# Patient Record
Sex: Male | Born: 1970
Health system: Southern US, Community
[De-identification: ages and names within clinical notes are randomized; demographics above are authoritative.]

## PROBLEM LIST (undated history)

## (undated) DIAGNOSIS — N289 Disorder of kidney and ureter, unspecified: Secondary | ICD-10-CM

## (undated) DIAGNOSIS — I00 Rheumatic fever without heart involvement: Secondary | ICD-10-CM

## (undated) DIAGNOSIS — S37011A Minor contusion of right kidney, initial encounter: Secondary | ICD-10-CM

## (undated) HISTORY — PX: OTHER SURGICAL HISTORY: SHX169

---
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
## 1991-10-14 DIAGNOSIS — M459 Ankylosing spondylitis of unspecified sites in spine: Secondary | ICD-10-CM

## 1991-10-14 HISTORY — DX: Ankylosing spondylitis of unspecified sites in spine: M45.9

## 1995-10-14 DIAGNOSIS — F411 Generalized anxiety disorder: Secondary | ICD-10-CM

## 1995-10-14 HISTORY — DX: Generalized anxiety disorder: F41.1

## 2003-10-14 DIAGNOSIS — F319 Bipolar disorder, unspecified: Secondary | ICD-10-CM

## 2003-10-14 DIAGNOSIS — F840 Autistic disorder: Secondary | ICD-10-CM

## 2003-10-14 HISTORY — DX: Bipolar disorder, unspecified: F31.9

## 2003-10-14 HISTORY — DX: Autistic disorder: F84.0

## 2005-10-13 DIAGNOSIS — E119 Type 2 diabetes mellitus without complications: Secondary | ICD-10-CM

## 2005-10-13 HISTORY — DX: Type 2 diabetes mellitus without complications: E11.9

## 2014-07-08 DIAGNOSIS — M25561 Pain in right knee: Secondary | ICD-10-CM | POA: Insufficient documentation

## 2015-05-31 ENCOUNTER — Other Ambulatory Visit: Payer: Self-pay | Admitting: General Surgery

## 2015-06-22 ENCOUNTER — Ambulatory Visit (HOSPITAL_COMMUNITY)
Admission: RE | Admit: 2015-06-22 | Discharge: 2015-06-22 | Disposition: A | Discharge status: Home / Self Care | Payer: BC Managed Care – PPO | Source: Ambulatory Visit | Attending: General Surgery | Admitting: General Surgery

## 2015-06-22 ENCOUNTER — Inpatient Hospital Stay (HOSPITAL_COMMUNITY)
Admission: RE | Admit: 2015-06-22 | Discharge: 2015-06-22 | Disposition: A | Discharge status: Home / Self Care | Payer: BC Managed Care – PPO | Source: Ambulatory Visit

## 2015-06-22 ENCOUNTER — Other Ambulatory Visit: Payer: Self-pay

## 2015-06-22 ENCOUNTER — Other Ambulatory Visit (HOSPITAL_COMMUNITY): Payer: Self-pay | Admitting: *Deleted

## 2015-06-22 DIAGNOSIS — Z01818 Encounter for other preprocedural examination: Secondary | ICD-10-CM | POA: Insufficient documentation

## 2015-07-05 ENCOUNTER — Encounter: Payer: BC Managed Care – PPO | Attending: General Surgery | Admitting: Dietician

## 2015-07-05 ENCOUNTER — Encounter: Payer: Self-pay | Admitting: Dietician

## 2015-07-05 DIAGNOSIS — Z713 Dietary counseling and surveillance: Secondary | ICD-10-CM | POA: Insufficient documentation

## 2015-07-05 DIAGNOSIS — Z6841 Body Mass Index (BMI) 40.0 and over, adult: Secondary | ICD-10-CM | POA: Insufficient documentation

## 2015-07-05 NOTE — Progress Notes (Signed)
  Pre-Op Assessment Visit:  Pre-Operative RYGB Surgery  Medical Nutrition Therapy:  Appt start time: 1535   End time:  1620.  Patient was seen on 07/05/2015 for Pre-Operative Nutrition Assessment. Assessment and letter of approval faxed to [facility] Bariatric Surgery Program coordinator on 07/05/2015.   Preferred Learning Style:   No preference indicated   Learning Readiness:   Ready  Handouts given during visit include:  Pre-Op Goals Bariatric Surgery Protein Shakes   During the appointment today the following Pre-Op Goals were reviewed with the patient: Maintain or lose weight as instructed by your surgeon Make healthy food choices Begin to limit portion sizes Limited concentrated sugars and fried foods Keep fat/sugar in the single digits per serving on   food labels Practice CHEWING your food  (aim for 30 chews per bite or until applesauce consistency) Practice not drinking 15 minutes before, during, and 30 minutes after each meal/snack Avoid all carbonated beverages  Avoid/limit caffeinated beverages  Avoid all sugar-sweetened beverages Consume 3 meals per day; eat every 3-5 hours Make a list of non-food related activities Aim for 64-100 ounces of FLUID daily  Aim for at least 60-80 grams of PROTEIN daily Look for a liquid protein source that contain ?15 g protein and ?5 g carbohydrate  (ex: shakes, drinks, shots)  Patient-Centered Goals: Goals: be more active with kids, have body feel better, be able to do anything physically that he wants to do, run a 5K   10 level of confidence/10 level of importance   Demonstrated degree of understanding via:  Teach Back  Teaching Method Utilized:  Visual Auditory Hands on  Barriers to learning/adherence to lifestyle change: none  Patient to call the [facility] to enroll in Pre-Op and Post-Op Nutrition Education when surgery date is scheduled.

## 2015-07-05 NOTE — Patient Instructions (Signed)
Follow Pre-Op Goals Try Protein Shakes  Things to remember:  Please always be honest with us. We want to support you!  If you have any questions or concerns in between appointments, please call or email Liz, Leslie, or Laurie.  The diet after surgery will be high protein and low in carbohydrate.  Vitamins and calcium need to be taken for the rest of your life.  Feel free to include support people in any classes or appointments. 

## 2015-07-11 NOTE — Progress Notes (Signed)
  Pre-Operative Nutrition Class:  Appt start time: 8127   End time:  1830.  Patient was seen on 07/09/15 for Pre-Operative Bariatric Surgery Education at the [facility].   Surgery date:  Surgery type: RYGB Start weight at [facility]: 371 lbs on 07/05/15 Weight today: 374.6 lbs  TANITA  BODY COMP RESULTS  07/09/15   BMI (kg/m^2) N/A   Fat Mass (lbs)    Fat Free Mass (lbs)    Total Body Water (lbs)      The following the learning objectives were met by the patient during this course:  Identify Pre-Op Dietary Goals and will begin 2 weeks pre-operatively  Identify appropriate sources of fluids and proteins   State protein recommendations and appropriate sources pre and post-operatively  Identify Post-Operative Dietary Goals and will follow for 2 weeks post-operatively  Identify appropriate multivitamin and calcium sources  Describe the need for physical activity post-operatively and will follow MD recommendations  State when to call healthcare provider regarding medication questions or post-operative complications  Handouts given during class include:  Pre-Op Bariatric Surgery Diet Handout  Protein Shake Handout  Post-Op Bariatric Surgery Nutrition Handout  BELT Program Information Flyer  Support Group Information Flyer  [facility] Bariatric Supplements Price List  Follow-Up Plan: Patient will follow-up at [facility] 2 weeks post operatively for diet advancement per MD.

## 2015-07-20 NOTE — Progress Notes (Signed)
Please put orders in Epic surgery 08-06-15 pre op 08-02-15 Thanks

## 2015-07-27 ENCOUNTER — Ambulatory Visit: Payer: Self-pay | Admitting: General Surgery

## 2015-07-27 NOTE — H&P (Signed)
Edward [name] 07/27/2015 1:36 PM Location: [facility] Patient #: [id number] DOB: 12/25/1970 Married / Language: English / Race: White Male History of Present Illness (Kylynn Street M. Oneda Duffett MD; 07/27/2015 2:06 PM) Patient words: preop.  The patient is a 44 year old male who presents for a pre-op visit. He comes in today for his preoperative visit. He was initially seen on August 18. His weight at that time was 381 pounds. He has been approved for laparoscopic Roux-en-Y gastric bypass. He denies any medical changes since he was initially seen. He did follow up with his endocrinologist after his evaluation labs revealed a hemoglobin A1c of 14.8. Due to some financial issues he had not been taking his insulin regularly. They have been monitoring him for the past couple weeks and have reported better blood glucose control. He denies any fever, chills, chest pain, shortness of breath, dyspnea on exertion, abdominal pain, nausea, vomiting, dysphagia. He denies any diarrhea or constipation. He reports feeling better and having more energy since his blood sugars are better controlled now. He denies any issues of sensations of the getting stuck in his upper neck or upper chest.  His upper GI showed no evidence of hiatal hernia. It did show some esophageal dysmotility. His abdominal ultrasound showed a single gallstone without any evidence of fatty liver. His lipid panel was abnormal. His total cholesterol level was 355, triglyceride level 300, HDL cholesterol 37, LDL cholesterol 258. This is actually improved for him. In comparing prior lipid panels his cholesterol level and triglyceride levels have been as high as 800. He reports a significant family history of hypercholesterolemia. Problem List/Past Medical (Errik Mitchelle M Shandrell Boda, MD; 07/27/2015 2:05 PM) MORBID OBESITY WITH BMI OF 50.0-59.9, ADULT (E66.01)  Other Problems (Njeri Vicente M Daneil Beem, MD; 07/27/2015 2:05 PM) Other disease, cancer,  significant illness Sleep Apnea HYPERCHOLESTEROLEMIA (E78.00) RHEUMATOID ARTHRITIS INVOLVING MULTIPLE SITES, UNSPECIFIED RHEUMATOID FACTOR PRESENCE (M06.9) DIABETES MELLITUS TYPE 2 IN OBESE (E11.9) ESSENTIAL HYPERTENSION (I10) ANXIETY AND DEPRESSION (F41.9, F32.9) Anxiety Disorder Back Pain  Past Surgical History (Fiore Detjen M Marian Meneely, MD; 07/27/2015 2:05 PM) Oral Surgery Hip Surgery Left. Knee Surgery Bilateral.  Diagnostic Studies History (Jisele Price M Cay Kath, MD; 07/27/2015 2:05 PM) Colonoscopy never  Allergies (Ammie Eversole, LPN; 07/27/2015 1:37 PM) Demerol *ANALGESICS - OPIOID*  Medication History (Jamieon Lannen M Rubena Roseman, MD; 07/27/2015 2:05 PM) AmLODIPine Besylate (5MG Tablet, Oral) Active. Invokana (300MG Tablet, Oral) Active. Carvedilol (12.5MG Tablet, Oral) Active. ClonazePAM (1MG Tablet, Oral) Active. Zetia (10MG Tablet, Oral) Active. NovoLOG FlexPen (100UNIT/ML Soln Pen-inj, Subcutaneous) Active. Lisinopril-Hydrochlorothiazide (20-12.5MG Tablet, Oral) Active. MetFORMIN HCl ER (500MG Tablet ER 24HR, Oral) Active. Livalo (2MG Tablet, Oral) Active. TiZANidine HCl (4MG Tablet, Oral) Active. TraMADol HCl (50MG Tablet, Oral) Active. Venlafaxine HCl ER (75MG Capsule ER 24HR, Oral) Active. Lantus (100UNIT/ML Solution, Subcutaneous) Active. Medications Reconciled OxyCODONE HCl (5MG/5ML Solution, 5-10 Milliliter Oral every four hours, as needed, Taken starting 07/27/2015) Active. Protonix (40MG Tablet DR, 1 (one) Tablet DR Oral daily, Taken starting 07/27/2015) Active. Ondansetron (4MG Tablet Disperse, 1 (one) Tablet Disperse Oral every six hours, as needed, Taken starting 07/27/2015) Active.  Social History (Alainah Phang M Damyah Gugel, MD; 07/27/2015 2:05 PM) Caffeine use Carbonated beverages, Tea. No drug use Tobacco use Former smoker. Alcohol use Occasional alcohol use.  Family History (Dustyn Dansereau M Shaketa Serafin, MD; 07/27/2015 2:05 PM) Arthritis Father, Mother. Cancer  Family Members In General, Father. Depression Brother. Thyroid problems Father. Diabetes Mellitus Brother, Father. Heart Disease Family Members In General. Hypertension Brother, Father, Mother.     Review of Systems (  Atilano Ina, MD; 07/27/2015 2:01 00) General Present- Fatigue and Night Sweats. Not Present- Appetite Loss, Chills, Fever, Weight Gain and Weight Loss. Skin Present- Dryness, Non-Healing Wounds and Rash. Not Present- Change in Wart/Mole, Hives, Jaundice, New Lesions and Ulcer. HEENT Present- Visual Disturbances and Wears glasses/contact lenses. Not Present- Earache, Hearing Loss, Hoarseness, Nose Bleed, Oral Ulcers, Ringing in the Ears, Seasonal Allergies, Sinus Pain, Sore Throat and Yellow Eyes. Respiratory Present- Snoring. Not Present- Bloody sputum, Chronic Cough, Difficulty Breathing and Wheezing. Breast Not Present- Breast Mass, Breast Pain, Nipple Discharge and Skin Changes. Cardiovascular Present- Swelling of Extremities. Not Present- Chest Pain, Difficulty Breathing Lying Down, Leg Cramps, Palpitations, Rapid Heart Rate and Shortness of Breath. Gastrointestinal Present- Excessive gas. Not Present- Abdominal Pain, Bloating, Bloody Stool, Change in Bowel Habits, Chronic diarrhea, Constipation, Difficulty Swallowing, Gets full quickly at meals, Hemorrhoids, Indigestion, Nausea, Rectal Pain and Vomiting. Male Genitourinary Present- Frequency, Nocturia, Urgency and Urine Leakage. Not Present- Blood in Urine, Change in Urinary Stream, Impotence and Painful Urination. Musculoskeletal Present- Back Pain, Joint Pain, Joint Stiffness, Muscle Pain, Muscle Weakness and Swelling of Extremities. Psychiatric Present- Anxiety and Depression. Not Present- Bipolar, Change in Sleep Pattern, Fearful and Frequent crying. Endocrine Present- Excessive Hunger and Heat Intolerance. Not Present- Cold Intolerance, Hair Changes and New Diabetes. Hematology Not Present- Easy Bruising, Excessive  bleeding, Gland problems, HIV and Persistent Infections.  Vitals (Ammie Eversole LPN; 61/60/7371 1:37 PM) 07/27/2015 1:36 PM Weight: 372.4 lb Height: 71.5in Body Surface Area: 2.92 m Body Mass Index: 51.21 kg/m Temp.: 98.71F(Oral)  Pulse: 72 (Regular)  BP: 132/80 (Sitting, Left Arm, Standard)     Physical Exam Minerva Areola M. Lorali Khamis MD; 07/27/2015 2:01 PM)  General Mental Status-Alert. General Appearance-Consistent with stated age. Hydration-Well hydrated. Voice-Normal. Note: morbidly obese; central truncal   Integumentary Note: knee scars   Head and Neck Head-normocephalic, atraumatic with no lesions or palpable masses. Trachea-midline. Thyroid Gland Characteristics - normal size and consistency.  Eye Eyeball - Bilateral-Extraocular movements intact. Sclera/Conjunctiva - Bilateral-No scleral icterus.  Chest and Lung Exam Chest and lung exam reveals -quiet, even and easy respiratory effort with no use of accessory muscles and on auscultation, normal breath sounds, no adventitious sounds and normal vocal resonance. Inspection Chest Wall - Normal. Back - normal.  Breast - Did not examine.  Cardiovascular Cardiovascular examination reveals -normal heart sounds, regular rate and rhythm with no murmurs and normal pedal pulses bilaterally.  Abdomen Inspection Inspection of the abdomen reveals - No Hernias. Skin - Scar - no surgical scars. Palpation/Percussion Palpation and Percussion of the abdomen reveal - Soft, Non Tender, No Rebound tenderness, No Rigidity (guarding) and No hepatosplenomegaly. Auscultation Auscultation of the abdomen reveals - Bowel sounds normal.  Peripheral Vascular Upper Extremity Palpation - Pulses bilaterally normal.  Neurologic Neurologic evaluation reveals -alert and oriented x 3 with no impairment of recent or remote memory. Mental Status-Normal.  Neuropsychiatric The patient's mood and affect are  described as -normal. Judgment and Insight-insight is appropriate concerning matters relevant to self.  Musculoskeletal Normal Exam - Left-Upper Extremity Strength Normal and Lower Extremity Strength Normal. Normal Exam - Right-Upper Extremity Strength Normal and Lower Extremity Strength Normal.  Lymphatic Head & Neck  General Head & Neck Lymphatics: Bilateral - Description - Normal. Axillary - Did not examine. Femoral & Inguinal - Did not examine.    Assessment & Plan Minerva Areola M. Peaches Vanoverbeke MD; 07/27/2015 2:05 PM)  MORBID OBESITY WITH BMI OF 50.0-59.9, ADULT (E66.01) Impression: We reviewed his preoperative workup. We discussed  results of his upper GI and abdominal ultrasound. He also reviewed his labs. I congratulated him on improvement of his diabetes management. We discussed the importance of monitoring his blood sugars regularly. We also discussed the importance of the preoperative diet. He has lost about 10 pounds since his initial visit. We discussed the typical postoperative course. He was given prescriptions today for postoperative pain, nausea, and reflux medications. He was also given instructions for his preoperative bowel prep.  Current Plans Pt Education - EMW_preopbariatric Started OxyCODONE HCl 5MG /5ML, 5-10 Milliliter every four hours, as needed, 200 Milliliter, 07/27/2015, No Refill. Started Protonix 40MG , 1 (one) Tablet DR daily, #30, 07/27/2015, No Refill. Started Ondansetron 4MG , 1 (one) Tablet Disperse every six hours, as needed, #25, 07/27/2015, No Refill. DIABETES MELLITUS TYPE 2 IN OBESE (E11.9)  RHEUMATOID ARTHRITIS INVOLVING MULTIPLE SITES, UNSPECIFIED RHEUMATOID FACTOR PRESENCE (M06.9) Impression: We discussed the need for him to avoid NSAIDs as much as possible after gastric bypass because of the risk of ulcer formation. We discussed that his regimen may have to be altered.  HYPERCHOLESTEROLEMIA (E78.00)  ANXIETY AND DEPRESSION (F41.9)  ESSENTIAL  HYPERTENSION (I10)  07/29/2015. , MD, FACS General, Bariatric, & Minimally Invasive Surgery [facility], [name]

## 2015-07-30 NOTE — Patient Instructions (Addendum)
Lycan Davee  07/30/2015   Your procedure is scheduled on: 08-06-15  Report to [facility] Main  Entrance take [facility]  elevators to 3rd floor to  Short Stay Center at 1140  AM.  Call this number if you have problems the morning of surgery [phone]   Remember: ONLY 1 PERSON MAY GO WITH YOU TO SHORT STAY TO GET  READY MORNING OF YOUR SURGERY.  Do not eat food  :After Midnight Saturday night clear liquids all day Sunday , clear liquids from midnight until 740 am day of surgery, nothing by mouth after 740 am day of your surgery.   follow all bowel prep instructions from dr wilson's office.    Take these medicines the morning of surgery with A SIP OF WATER: amlodipine (norvasc), venlafaxine (effexor) carvedilol. Clonazepam, zanaflez, , take 1/2 dose bedtime lantus insulin on 10/23-16,  DO NOT TAKE ANY DIABETIC MEDICATIONS DAY OF YOUR SURGERY                               You may not have any metal on your body including hair pins and              piercings  Do not wear jewelry, make-up, lotions, powders or perfumes, deodorant             Do not wear nail polish.  Do not shave  48 hours prior to surgery.              Men may shave face and neck.   Do not bring valuables to the hospital. [facility] IS NOT             RESPONSIBLE   FOR VALUABLES.  Contacts, dentures or bridgework may not be worn into surgery.  Leave suitcase in the car. After surgery it may be brought to your room.     Patients discharged the day of surgery will not be allowed to drive home.  Name and phone number of your driver:  Special Instructions: N/A              Please read over the following fact sheets you were given: _____________________________________________________________________                CLEAR LIQUID DIET   Foods Allowed                                                                     Foods Excluded  Coffee and tea, regular and decaf                              liquids that you cannot  Plain Jell-O in any flavor                                             see through such as: Fruit ices (not with fruit pulp)  milk, soups, orange juice  Iced Popsicles                                    All solid food Carbonated beverages, regular and diet                                    Cranberry, grape and apple juices Sports drinks like Gatorade Lightly seasoned clear broth or consume(fat free) Sugar, honey syrup  Sample Menu Breakfast                                Lunch                                     Supper Cranberry juice                    Beef broth                            Chicken broth Jell-O                                     Grape juice                           Apple juice Coffee or tea                        Jell-O                                      Popsicle                                                Coffee or tea                        Coffee or tea  _____________________________________________________________________  [facility] - Preparing for Surgery Before surgery, you can play an important role.  Because skin is not sterile, your skin needs to be as free of germs as possible.  You can reduce the number of germs on your skin by washing with CHG (chlorahexidine gluconate) soap before surgery.  CHG is an antiseptic cleaner which kills germs and bonds with the skin to continue killing germs even after washing. Please DO NOT use if you have an allergy to CHG or antibacterial soaps.  If your skin becomes reddened/irritated stop using the CHG and inform your nurse when you arrive at Short Stay. Do not shave (including legs and underarms) for at least 48 hours prior to the first CHG shower.  You may shave your face/neck. Please follow these instructions carefully:  1.  Shower with CHG Soap the night before surgery and the  morning of Surgery.  2.  If you choose to wash  your hair, wash your hair first  as usual with your  normal  shampoo.  3.  After you shampoo, rinse your hair and body thoroughly to remove the  shampoo.                           4.  Use CHG as you would any other liquid soap.  You can apply chg directly  to the skin and wash                       Gently with a scrungie or clean washcloth.  5.  Apply the CHG Soap to your body ONLY FROM THE NECK DOWN.   Do not use on face/ open                           Wound or open sores. Avoid contact with eyes, ears mouth and genitals (private parts).                       Wash face,  Genitals (private parts) with your normal soap.             6.  Wash thoroughly, paying special attention to the area where your surgery  will be performed.  7.  Thoroughly rinse your body with warm water from the neck down.  8.  DO NOT shower/wash with your normal soap after using and rinsing off  the CHG Soap.                9.  Pat yourself dry with a clean towel.            10.  Wear clean pajamas.            11.  Place clean sheets on your bed the night of your first shower and do not  sleep with pets. Day of Surgery : Do not apply any lotions/deodorants the morning of surgery.  Please wear clean clothes to the hospital/surgery center.  FAILURE TO FOLLOW THESE INSTRUCTIONS MAY RESULT IN THE CANCELLATION OF YOUR SURGERY PATIENT SIGNATURE_________________________________  NURSE SIGNATURE__________________________________  ________________________________________________________________________

## 2015-08-02 ENCOUNTER — Encounter (HOSPITAL_COMMUNITY)
Admission: RE | Admit: 2015-08-02 | Discharge: 2015-08-02 | Disposition: A | Discharge status: Home / Self Care | Payer: BC Managed Care – PPO | Source: Ambulatory Visit | Attending: General Surgery | Admitting: General Surgery

## 2015-08-02 ENCOUNTER — Encounter (HOSPITAL_COMMUNITY): Payer: Self-pay

## 2015-08-02 DIAGNOSIS — Z01818 Encounter for other preprocedural examination: Secondary | ICD-10-CM | POA: Insufficient documentation

## 2015-08-02 HISTORY — DX: Rheumatic fever without heart involvement: I00

## 2015-08-02 LAB — CBC WITH DIFFERENTIAL/PLATELET
BASOS PCT: 1 %
Basophils Absolute: 0.1 10*3/uL (ref 0.0–0.1)
Eosinophils Absolute: 0.2 10*3/uL (ref 0.0–0.7)
Eosinophils Relative: 3 %
HEMATOCRIT: 51.8 % (ref 39.0–52.0)
Hemoglobin: 16.6 g/dL (ref 13.0–17.0)
LYMPHS PCT: 34 %
Lymphs Abs: 3.1 10*3/uL (ref 0.7–4.0)
MCH: 29 pg (ref 26.0–34.0)
MCHC: 32 g/dL (ref 30.0–36.0)
MCV: 90.4 fL (ref 78.0–100.0)
MONO ABS: 0.6 10*3/uL (ref 0.1–1.0)
MONOS PCT: 7 %
NEUTROS ABS: 5 10*3/uL (ref 1.7–7.7)
Neutrophils Relative %: 55 %
Platelets: 290 10*3/uL (ref 150–400)
RBC: 5.73 MIL/uL (ref 4.22–5.81)
RDW: 14.6 % (ref 11.5–15.5)
WBC: 8.9 10*3/uL (ref 4.0–10.5)

## 2015-08-02 LAB — COMPREHENSIVE METABOLIC PANEL
ALT: 18 U/L (ref 17–63)
AST: 25 U/L (ref 15–41)
Albumin: 3.7 g/dL (ref 3.5–5.0)
Alkaline Phosphatase: 78 U/L (ref 38–126)
Anion gap: 7 (ref 5–15)
BILIRUBIN TOTAL: 0.3 mg/dL (ref 0.3–1.2)
BUN: 17 mg/dL (ref 6–20)
CHLORIDE: 103 mmol/L (ref 101–111)
CO2: 30 mmol/L (ref 22–32)
CREATININE: 0.78 mg/dL (ref 0.61–1.24)
Calcium: 9.7 mg/dL (ref 8.9–10.3)
GFR calc non Af Amer: >60 mL/min (ref >60)
GLUCOSE: 58 mg/dL — AB (ref 65–99)
Potassium: 4.3 mmol/L (ref 3.5–5.1)
Sodium: 140 mmol/L (ref 135–145)
Total Protein: 7.6 g/dL (ref 6.5–8.1)

## 2015-08-02 NOTE — Progress Notes (Signed)
   08/02/15 0859  OBSTRUCTIVE SLEEP APNEA  Have you ever been diagnosed with sleep apnea through a sleep study? No  Do you snore loudly (loud enough to be heard through closed doors)?  1  Do you often feel tired, fatigued, or sleepy during the daytime (such as falling asleep during driving or talking to someone)? 0  Has anyone observed you stop breathing during your sleep? 0  Do you have, or are you being treated for high blood pressure? 1  BMI more than 35 kg/m2? 1  Age > 50 (1-yes) 0  Neck circumference greater than:Male 16 inches or larger, Male 17inches or larger? 1 (18.5 inches)  Male Gender (Yes=1) 1  Obstructive Sleep Apnea Score 5  Score 5 or greater  Results sent to PCP

## 2015-08-02 NOTE — Progress Notes (Signed)
Bariatric bed with trapeze ordered for day of surgery with derrick portable equipment

## 2015-08-02 NOTE — Progress Notes (Signed)
ekg and chest xray 06-22-15 epic

## 2015-08-06 ENCOUNTER — Encounter (HOSPITAL_COMMUNITY): Payer: Self-pay | Admitting: *Deleted

## 2015-08-06 ENCOUNTER — Inpatient Hospital Stay (HOSPITAL_COMMUNITY)
Admission: RE | Admit: 2015-08-06 | Discharge: 2015-08-08 | DRG: 621 | Disposition: A | Discharge status: Home / Self Care | Payer: BC Managed Care – PPO | Source: Ambulatory Visit | Attending: General Surgery | Admitting: General Surgery

## 2015-08-06 ENCOUNTER — Inpatient Hospital Stay (HOSPITAL_COMMUNITY): Payer: BC Managed Care – PPO | Admitting: Anesthesiology

## 2015-08-06 ENCOUNTER — Encounter (HOSPITAL_COMMUNITY)
Admission: RE | Disposition: A | Discharge status: Home / Self Care | Payer: Self-pay | Source: Ambulatory Visit | Attending: General Surgery

## 2015-08-06 DIAGNOSIS — Z01812 Encounter for preprocedural laboratory examination: Secondary | ICD-10-CM

## 2015-08-06 DIAGNOSIS — Z8349 Family history of other endocrine, nutritional and metabolic diseases: Secondary | ICD-10-CM

## 2015-08-06 DIAGNOSIS — E66811 Obesity, class 1: Secondary | ICD-10-CM

## 2015-08-06 DIAGNOSIS — R110 Nausea: Secondary | ICD-10-CM

## 2015-08-06 DIAGNOSIS — F329 Major depressive disorder, single episode, unspecified: Secondary | ICD-10-CM | POA: Diagnosis present

## 2015-08-06 DIAGNOSIS — Z8249 Family history of ischemic heart disease and other diseases of the circulatory system: Secondary | ICD-10-CM

## 2015-08-06 DIAGNOSIS — F419 Anxiety disorder, unspecified: Secondary | ICD-10-CM | POA: Diagnosis present

## 2015-08-06 DIAGNOSIS — E1169 Type 2 diabetes mellitus with other specified complication: Secondary | ICD-10-CM

## 2015-08-06 DIAGNOSIS — G4730 Sleep apnea, unspecified: Secondary | ICD-10-CM | POA: Diagnosis present

## 2015-08-06 DIAGNOSIS — I10 Essential (primary) hypertension: Secondary | ICD-10-CM | POA: Diagnosis present

## 2015-08-06 DIAGNOSIS — E8881 Metabolic syndrome: Secondary | ICD-10-CM | POA: Diagnosis present

## 2015-08-06 DIAGNOSIS — E669 Obesity, unspecified: Secondary | ICD-10-CM

## 2015-08-06 DIAGNOSIS — K219 Gastro-esophageal reflux disease without esophagitis: Secondary | ICD-10-CM | POA: Diagnosis present

## 2015-08-06 DIAGNOSIS — Z87891 Personal history of nicotine dependence: Secondary | ICD-10-CM | POA: Diagnosis not present

## 2015-08-06 DIAGNOSIS — Z833 Family history of diabetes mellitus: Secondary | ICD-10-CM | POA: Diagnosis not present

## 2015-08-06 DIAGNOSIS — K224 Dyskinesia of esophagus: Secondary | ICD-10-CM | POA: Diagnosis present

## 2015-08-06 DIAGNOSIS — E785 Hyperlipidemia, unspecified: Secondary | ICD-10-CM | POA: Diagnosis present

## 2015-08-06 DIAGNOSIS — E781 Pure hyperglyceridemia: Secondary | ICD-10-CM | POA: Diagnosis present

## 2015-08-06 DIAGNOSIS — Z6841 Body Mass Index (BMI) 40.0 and over, adult: Secondary | ICD-10-CM

## 2015-08-06 DIAGNOSIS — E7800 Pure hypercholesterolemia, unspecified: Secondary | ICD-10-CM | POA: Diagnosis present

## 2015-08-06 DIAGNOSIS — M459 Ankylosing spondylitis of unspecified sites in spine: Secondary | ICD-10-CM | POA: Diagnosis present

## 2015-08-06 DIAGNOSIS — E119 Type 2 diabetes mellitus without complications: Secondary | ICD-10-CM | POA: Diagnosis present

## 2015-08-06 DIAGNOSIS — Z9884 Bariatric surgery status: Secondary | ICD-10-CM

## 2015-08-06 HISTORY — PX: GASTRIC ROUX-EN-Y: SHX5262

## 2015-08-06 HISTORY — PX: UPPER GI ENDOSCOPY: SHX6162

## 2015-08-06 LAB — URINALYSIS, ROUTINE W REFLEX MICROSCOPIC
Bilirubin Urine: NEGATIVE
GLUCOSE, UA: 100 mg/dL — AB
KETONES UR: NEGATIVE mg/dL
NITRITE: NEGATIVE
PROTEIN: 100 mg/dL — AB
Specific Gravity, Urine: 1.018 (ref 1.005–1.030)
UROBILINOGEN UA: 0.2 mg/dL (ref 0.0–1.0)
pH: 5 (ref 5.0–8.0)

## 2015-08-06 LAB — URINE MICROSCOPIC-ADD ON

## 2015-08-06 LAB — GLUCOSE, CAPILLARY
GLUCOSE-CAPILLARY: 198 mg/dL — AB (ref 65–99)
Glucose-Capillary: 127 mg/dL — ABNORMAL HIGH (ref 65–99)

## 2015-08-06 LAB — HEMOGLOBIN AND HEMATOCRIT, BLOOD
HCT: 50.4 % (ref 39.0–52.0)
Hemoglobin: 16.3 g/dL (ref 13.0–17.0)

## 2015-08-06 SURGERY — LAPAROSCOPIC ROUX-EN-Y GASTRIC BYPASS WITH UPPER ENDOSCOPY
Anesthesia: General

## 2015-08-06 MED ORDER — FENTANYL CITRATE (PF) 100 MCG/2ML IJ SOLN
INTRAMUSCULAR | Status: AC
  Filled 2015-08-06: qty 2

## 2015-08-06 MED ORDER — SODIUM CHLORIDE 0.9 % IJ SOLN
INTRAMUSCULAR | Status: DC | PRN
  Administered 2015-08-06: 50 mL via INTRAVENOUS

## 2015-08-06 MED ORDER — LACTATED RINGERS IV SOLN: INTRAVENOUS | Status: DC

## 2015-08-06 MED ORDER — ONDANSETRON HCL 4 MG/2ML IJ SOLN
INTRAMUSCULAR | Status: AC
  Filled 2015-08-06: qty 2

## 2015-08-06 MED ORDER — SODIUM CHLORIDE 0.9 % IJ SOLN
INTRAMUSCULAR | Status: AC
  Filled 2015-08-06: qty 50

## 2015-08-06 MED ORDER — EPHEDRINE SULFATE 50 MG/ML IJ SOLN
INTRAMUSCULAR | Status: DC | PRN
  Administered 2015-08-06: 5 mg via INTRAVENOUS

## 2015-08-06 MED ORDER — MIDAZOLAM HCL 5 MG/5ML IJ SOLN
INTRAMUSCULAR | Status: DC | PRN
  Administered 2015-08-06: 2 mg via INTRAVENOUS

## 2015-08-06 MED ORDER — CEFOTETAN DISODIUM-DEXTROSE 2-2.08 GM-% IV SOLR
INTRAVENOUS | Status: AC
  Filled 2015-08-06: qty 50

## 2015-08-06 MED ORDER — EPHEDRINE SULFATE 50 MG/ML IJ SOLN
INTRAMUSCULAR | Status: AC
  Filled 2015-08-06: qty 1

## 2015-08-06 MED ORDER — INSULIN ASPART 100 UNIT/ML ~~LOC~~ SOLN
0.0000 [IU] | SUBCUTANEOUS | Status: DC
Start: 2015-08-06 — End: 2015-08-08
  Administered 2015-08-06: 7 [IU] via SUBCUTANEOUS
  Administered 2015-08-07: 4 [IU] via SUBCUTANEOUS
  Administered 2015-08-07: 3 [IU] via SUBCUTANEOUS
  Administered 2015-08-07: 4 [IU] via SUBCUTANEOUS
  Administered 2015-08-07: 3 [IU] via SUBCUTANEOUS
  Administered 2015-08-07: 4 [IU] via SUBCUTANEOUS
  Administered 2015-08-07: 7 [IU] via SUBCUTANEOUS
  Administered 2015-08-08 (×2): 4 [IU] via SUBCUTANEOUS
  Administered 2015-08-08: 3 [IU] via SUBCUTANEOUS
  Administered 2015-08-08: 4 [IU] via SUBCUTANEOUS

## 2015-08-06 MED ORDER — ENOXAPARIN SODIUM 30 MG/0.3ML ~~LOC~~ SOLN
30.0000 mg | Freq: Two times a day (BID) | SUBCUTANEOUS | Status: DC
Start: 2015-08-07 — End: 2015-08-08
  Administered 2015-08-07 – 2015-08-08 (×3): 30 mg via SUBCUTANEOUS
  Filled 2015-08-06 (×5): qty 0.3

## 2015-08-06 MED ORDER — ROCURONIUM BROMIDE 100 MG/10ML IV SOLN
INTRAVENOUS | Status: AC
  Filled 2015-08-06: qty 1

## 2015-08-06 MED ORDER — SUGAMMADEX SODIUM 500 MG/5ML IV SOLN
INTRAVENOUS | Status: AC
  Filled 2015-08-06: qty 5

## 2015-08-06 MED ORDER — ACETAMINOPHEN 160 MG/5ML PO SOLN: 325.0000 mg | ORAL | Status: DC | PRN

## 2015-08-06 MED ORDER — METOCLOPRAMIDE HCL 5 MG/ML IJ SOLN
10.0000 mg | Freq: Three times a day (TID) | INTRAMUSCULAR | Status: DC | PRN
  Administered 2015-08-07: 10 mg via INTRAVENOUS
  Filled 2015-08-06: qty 2

## 2015-08-06 MED ORDER — BUPIVACAINE LIPOSOME 1.3 % IJ SUSP
INTRAMUSCULAR | Status: DC | PRN
  Administered 2015-08-06: 20 mL

## 2015-08-06 MED ORDER — INFLUENZA VAC SPLIT QUAD 0.5 ML IM SUSY
0.5000 mL | PREFILLED_SYRINGE | INTRAMUSCULAR | Status: AC
  Administered 2015-08-08: 0.5 mL via INTRAMUSCULAR
  Filled 2015-08-06 (×3): qty 0.5

## 2015-08-06 MED ORDER — CHLORHEXIDINE GLUCONATE 4 % EX LIQD: 60.0000 mL | Freq: Once | CUTANEOUS | Status: DC

## 2015-08-06 MED ORDER — STERILE WATER FOR IRRIGATION IR SOLN
Status: DC | PRN
  Administered 2015-08-06: 2000 mL

## 2015-08-06 MED ORDER — OXYCODONE HCL 5 MG/5ML PO SOLN
5.0000 mg | ORAL | Status: DC | PRN
  Administered 2015-08-07: 10 mg via ORAL
  Administered 2015-08-07: 5 mg via ORAL
  Administered 2015-08-07 – 2015-08-08 (×3): 10 mg via ORAL
  Filled 2015-08-06: qty 10
  Filled 2015-08-06: qty 5
  Filled 2015-08-06 (×4): qty 10

## 2015-08-06 MED ORDER — HEPARIN SODIUM (PORCINE) 5000 UNIT/ML IJ SOLN
5000.0000 [IU] | INTRAMUSCULAR | Status: AC
  Administered 2015-08-06: 5000 [IU] via SUBCUTANEOUS
  Filled 2015-08-06: qty 1

## 2015-08-06 MED ORDER — DEXAMETHASONE SODIUM PHOSPHATE 10 MG/ML IJ SOLN
INTRAMUSCULAR | Status: AC
  Filled 2015-08-06: qty 1

## 2015-08-06 MED ORDER — PROMETHAZINE HCL 25 MG/ML IJ SOLN: 12.5000 mg | Freq: Four times a day (QID) | INTRAMUSCULAR | Status: DC | PRN

## 2015-08-06 MED ORDER — MIDAZOLAM HCL 2 MG/2ML IJ SOLN
INTRAMUSCULAR | Status: AC
  Filled 2015-08-06: qty 4

## 2015-08-06 MED ORDER — HYDROMORPHONE HCL 1 MG/ML IJ SOLN
0.5000 mg | INTRAMUSCULAR | Status: DC | PRN
  Administered 2015-08-06 (×2): 0.5 mg via INTRAVENOUS

## 2015-08-06 MED ORDER — LACTATED RINGERS IR SOLN
Status: DC | PRN
  Administered 2015-08-06: 3000 mL

## 2015-08-06 MED ORDER — MORPHINE SULFATE (PF) 2 MG/ML IV SOLN
2.0000 mg | INTRAVENOUS | Status: DC | PRN
  Administered 2015-08-06 – 2015-08-07 (×4): 4 mg via INTRAVENOUS
  Administered 2015-08-07: 2 mg via INTRAVENOUS
  Administered 2015-08-07 (×2): 4 mg via INTRAVENOUS
  Filled 2015-08-06 (×3): qty 2
  Filled 2015-08-06: qty 1
  Filled 2015-08-06 (×4): qty 2

## 2015-08-06 MED ORDER — ONDANSETRON HCL 4 MG/2ML IJ SOLN
INTRAMUSCULAR | Status: DC | PRN
  Administered 2015-08-06: 4 mg via INTRAVENOUS

## 2015-08-06 MED ORDER — BUPIVACAINE-EPINEPHRINE (PF) 0.25% -1:200000 IJ SOLN
INTRAMUSCULAR | Status: AC
  Filled 2015-08-06: qty 30

## 2015-08-06 MED ORDER — FENTANYL CITRATE (PF) 100 MCG/2ML IJ SOLN
INTRAMUSCULAR | Status: DC | PRN
  Administered 2015-08-06: 50 ug via INTRAVENOUS
  Administered 2015-08-06: 100 ug via INTRAVENOUS
  Administered 2015-08-06 (×6): 50 ug via INTRAVENOUS

## 2015-08-06 MED ORDER — ROCURONIUM BROMIDE 100 MG/10ML IV SOLN
INTRAVENOUS | Status: DC | PRN
  Administered 2015-08-06: 5 mg via INTRAVENOUS
  Administered 2015-08-06: 10 mg via INTRAVENOUS
  Administered 2015-08-06: 20 mg via INTRAVENOUS
  Administered 2015-08-06 (×2): 10 mg via INTRAVENOUS
  Administered 2015-08-06: 5 mg via INTRAVENOUS
  Administered 2015-08-06: 10 mg via INTRAVENOUS
  Administered 2015-08-06: 60 mg via INTRAVENOUS

## 2015-08-06 MED ORDER — 0.9 % SODIUM CHLORIDE (POUR BTL) OPTIME
TOPICAL | Status: DC | PRN
  Administered 2015-08-06: 1000 mL

## 2015-08-06 MED ORDER — SUGAMMADEX SODIUM 500 MG/5ML IV SOLN
INTRAVENOUS | Status: DC | PRN
  Administered 2015-08-06: 500 mg via INTRAVENOUS

## 2015-08-06 MED ORDER — EVICEL 5 ML EX KIT
PACK | Freq: Once | CUTANEOUS | Status: DC
  Filled 2015-08-06: qty 1

## 2015-08-06 MED ORDER — CEFOTETAN DISODIUM-DEXTROSE 2-2.08 GM-% IV SOLR
2.0000 g | INTRAVENOUS | Status: AC
  Administered 2015-08-06: 2 g via INTRAVENOUS

## 2015-08-06 MED ORDER — DEXAMETHASONE SODIUM PHOSPHATE 10 MG/ML IJ SOLN
INTRAMUSCULAR | Status: DC | PRN
  Administered 2015-08-06: 10 mg via INTRAVENOUS

## 2015-08-06 MED ORDER — PROPOFOL 10 MG/ML IV BOLUS
INTRAVENOUS | Status: AC
  Filled 2015-08-06: qty 20

## 2015-08-06 MED ORDER — POTASSIUM CHLORIDE IN NACL 20-0.45 MEQ/L-% IV SOLN
INTRAVENOUS | Status: DC
  Administered 2015-08-06 – 2015-08-08 (×4): via INTRAVENOUS
  Filled 2015-08-06 (×7): qty 1000

## 2015-08-06 MED ORDER — BUPIVACAINE LIPOSOME 1.3 % IJ SUSP
20.0000 mL | Freq: Once | INTRAMUSCULAR | Status: DC
  Filled 2015-08-06: qty 20

## 2015-08-06 MED ORDER — PROPOFOL 10 MG/ML IV BOLUS
INTRAVENOUS | Status: DC | PRN
  Administered 2015-08-06: 300 mg via INTRAVENOUS

## 2015-08-06 MED ORDER — PANTOPRAZOLE SODIUM 40 MG IV SOLR
40.0000 mg | Freq: Every day | INTRAVENOUS | Status: DC
  Administered 2015-08-06 – 2015-08-07 (×2): 40 mg via INTRAVENOUS
  Filled 2015-08-06 (×3): qty 40

## 2015-08-06 MED ORDER — ONDANSETRON HCL 4 MG/2ML IJ SOLN
4.0000 mg | INTRAMUSCULAR | Status: DC | PRN
  Administered 2015-08-07: 4 mg via INTRAVENOUS
  Filled 2015-08-06: qty 2

## 2015-08-06 MED ORDER — METHOCARBAMOL 1000 MG/10ML IJ SOLN
500.0000 mg | Freq: Three times a day (TID) | INTRAVENOUS | Status: DC
  Administered 2015-08-06 – 2015-08-08 (×5): 500 mg via INTRAVENOUS
  Filled 2015-08-06 (×6): qty 5

## 2015-08-06 MED ORDER — SUCCINYLCHOLINE CHLORIDE 20 MG/ML IJ SOLN
INTRAMUSCULAR | Status: DC | PRN
  Administered 2015-08-06: 160 mg via INTRAVENOUS

## 2015-08-06 MED ORDER — LACTATED RINGERS IV SOLN
INTRAVENOUS | Status: DC
  Administered 2015-08-06: 1000 mL via INTRAVENOUS

## 2015-08-06 MED ORDER — PREMIER PROTEIN SHAKE
2.0000 [oz_av] | Freq: Four times a day (QID) | ORAL | Status: DC
  Administered 2015-08-08 (×2): 2 [oz_av] via ORAL

## 2015-08-06 MED ORDER — ACETAMINOPHEN 160 MG/5ML PO SOLN: 650.0000 mg | ORAL | Status: DC | PRN

## 2015-08-06 MED ORDER — LIDOCAINE HCL (CARDIAC) 20 MG/ML IV SOLN
INTRAVENOUS | Status: DC | PRN
  Administered 2015-08-06: 100 mg via INTRAVENOUS

## 2015-08-06 MED ORDER — EVICEL 5 ML EX KIT
PACK | Freq: Once | CUTANEOUS | Status: AC
  Administered 2015-08-06: 2
  Filled 2015-08-06: qty 1

## 2015-08-06 MED ORDER — LIDOCAINE HCL (CARDIAC) 20 MG/ML IV SOLN
INTRAVENOUS | Status: AC
  Filled 2015-08-06: qty 5

## 2015-08-06 MED ORDER — HYDROMORPHONE HCL 1 MG/ML IJ SOLN
INTRAMUSCULAR | Status: AC
  Filled 2015-08-06: qty 1

## 2015-08-06 MED ORDER — FENTANYL CITRATE (PF) 250 MCG/5ML IJ SOLN
INTRAMUSCULAR | Status: AC
  Filled 2015-08-06: qty 25

## 2015-08-06 MED ORDER — SODIUM CHLORIDE 0.9 % IJ SOLN
INTRAMUSCULAR | Status: AC
  Filled 2015-08-06: qty 10

## 2015-08-06 MED ORDER — HYDROMORPHONE HCL 1 MG/ML IJ SOLN: 0.2500 mg | INTRAMUSCULAR | Status: DC | PRN

## 2015-08-06 MED ORDER — FENTANYL CITRATE (PF) 100 MCG/2ML IJ SOLN
INTRAMUSCULAR | Status: AC
  Filled 2015-08-06: qty 4

## 2015-08-06 SURGICAL SUPPLY — 69 items
APPLICATOR COTTON TIP 6IN STRL (MISCELLANEOUS) IMPLANT
BLADE SURG SZ11 CARB STEEL (BLADE) ×4 IMPLANT
CABLE HIGH FREQUENCY MONO STRZ (ELECTRODE) ×4 IMPLANT
CHLORAPREP W/TINT 26ML (MISCELLANEOUS) ×8 IMPLANT
CLIP SUT LAPRA TY ABSORB (SUTURE) ×8 IMPLANT
COVER SURGICAL LIGHT HANDLE (MISCELLANEOUS) IMPLANT
CUTTER FLEX LINEAR 45M (STAPLE) IMPLANT
DERMABOND ADVANCED (GAUZE/BANDAGES/DRESSINGS) ×2
DERMABOND ADVANCED .7 DNX12 (GAUZE/BANDAGES/DRESSINGS) ×2 IMPLANT
DEVICE SUT QUICK LOAD TK 5 (STAPLE) IMPLANT
DEVICE SUT TI-KNOT TK 5X26 (MISCELLANEOUS) IMPLANT
DEVICE SUTURE ENDOST 10MM (ENDOMECHANICALS) ×4 IMPLANT
DEVICE TI KNOT TK5 (MISCELLANEOUS)
DRAIN PENROSE 18X1/4 LTX STRL (WOUND CARE) ×4 IMPLANT
DRAPE CAMERA CLOSED 9X96 (DRAPES) ×4 IMPLANT
ELECT REM PT RETURN 9FT ADLT (ELECTROSURGICAL) ×4
ELECTRODE REM PT RTRN 9FT ADLT (ELECTROSURGICAL) ×2 IMPLANT
GAUZE SPONGE 4X4 12PLY STRL (GAUZE/BANDAGES/DRESSINGS) IMPLANT
GAUZE SPONGE 4X4 16PLY XRAY LF (GAUZE/BANDAGES/DRESSINGS) ×4 IMPLANT
GLOVE BIOGEL M STRL SZ7.5 (GLOVE) ×16 IMPLANT
GOWN STRL REUS W/TWL XL LVL3 (GOWN DISPOSABLE) ×20 IMPLANT
HOVERMATT SINGLE USE (MISCELLANEOUS) ×4 IMPLANT
KIT BASIN OR (CUSTOM PROCEDURE TRAY) ×4 IMPLANT
KIT GASTRIC LAVAGE 34FR ADT (SET/KITS/TRAYS/PACK) ×4 IMPLANT
LUBRICANT JELLY K Y 4OZ (MISCELLANEOUS) ×4 IMPLANT
NEEDLE SPNL 22GX3.5 QUINCKE BK (NEEDLE) ×4 IMPLANT
PACK CARDIOVASCULAR III (CUSTOM PROCEDURE TRAY) ×4 IMPLANT
PEN SKIN MARKING BROAD (MISCELLANEOUS) ×4 IMPLANT
QUICK LOAD TK 5 (STAPLE)
RELOAD 45 VASCULAR/THIN (ENDOMECHANICALS) IMPLANT
RELOAD ENDO STITCH 2.0 (ENDOMECHANICALS) ×22
RELOAD STAPLE TA45 3.5 REG BLU (ENDOMECHANICALS) IMPLANT
RELOAD STAPLER BLUE 60MM (STAPLE) ×16 IMPLANT
RELOAD STAPLER GOLD 60MM (STAPLE) ×2 IMPLANT
RELOAD STAPLER WHITE 60MM (STAPLE) ×2 IMPLANT
SCISSORS LAP 5X45 EPIX DISP (ENDOMECHANICALS) ×4 IMPLANT
SEALANT SURGICAL APPL DUAL CAN (MISCELLANEOUS) IMPLANT
SET IRRIG TUBING LAPAROSCOPIC (IRRIGATION / IRRIGATOR) ×4 IMPLANT
SHEARS HARMONIC ACE PLUS 45CM (MISCELLANEOUS) ×4 IMPLANT
SLEEVE ADV FIXATION 12X100MM (TROCAR) ×8 IMPLANT
SLEEVE XCEL OPT CAN 5 100 (ENDOMECHANICALS) IMPLANT
SOLUTION ANTI FOG 6CC (MISCELLANEOUS) ×4 IMPLANT
STAPLER ECHELON BIOABSB 60 FLE (MISCELLANEOUS) ×16 IMPLANT
STAPLER ECHELON LONG 60 440 (INSTRUMENTS) ×4 IMPLANT
STAPLER RELOAD BLUE 60MM (STAPLE) ×32
STAPLER RELOAD GOLD 60MM (STAPLE) ×4
STAPLER RELOAD WHITE 60MM (STAPLE) ×4
SUT MNCRL AB 4-0 PS2 18 (SUTURE) ×4 IMPLANT
SUT RELOAD ENDO STITCH 2 48X1 (ENDOMECHANICALS) ×14
SUT RELOAD ENDO STITCH 2.0 (ENDOMECHANICALS) ×8
SUT SURGIDAC NAB ES-9 0 48 120 (SUTURE) IMPLANT
SUT VIC AB 2-0 SH 27 (SUTURE) ×2
SUT VIC AB 2-0 SH 27X BRD (SUTURE) ×2 IMPLANT
SUTURE RELOAD END STTCH 2 48X1 (ENDOMECHANICALS) ×14 IMPLANT
SUTURE RELOAD ENDO STITCH 2.0 (ENDOMECHANICALS) ×8 IMPLANT
SYR 10ML ECCENTRIC (SYRINGE) ×4 IMPLANT
SYR 20CC LL (SYRINGE) ×8 IMPLANT
TOWEL OR 17X26 10 PK STRL BLUE (TOWEL DISPOSABLE) ×4 IMPLANT
TOWEL OR NON WOVEN STRL DISP B (DISPOSABLE) ×4 IMPLANT
TRAY FOLEY W/METER SILVER 14FR (SET/KITS/TRAYS/PACK) IMPLANT
TRAY FOLEY W/METER SILVER 16FR (SET/KITS/TRAYS/PACK) ×4 IMPLANT
TROCAR ADV FIXATION 12X100MM (TROCAR) ×4 IMPLANT
TROCAR ADV FIXATION 5X100MM (TROCAR) ×4 IMPLANT
TROCAR BLADELESS OPT 5 100 (ENDOMECHANICALS) ×4 IMPLANT
TROCAR XCEL 12X100 BLDLESS (ENDOMECHANICALS) ×4 IMPLANT
TUBING CONNECTING 10 (TUBING) IMPLANT
TUBING CONNECTING 10' (TUBING)
TUBING ENDO SMARTCAP PENTAX (MISCELLANEOUS) ×4 IMPLANT
TUBING FILTER THERMOFLATOR (ELECTROSURGICAL) ×4 IMPLANT

## 2015-08-06 NOTE — Interval H&P Note (Signed)
History and Physical Interval Note:  08/06/2015 3:10 PM  Edward [name]  has presented today for surgery, with the diagnosis of Morbid Obesity  The various methods of treatment have been discussed with the patient and family. After consideration of risks, benefits and other options for treatment, the patient has consented to  Procedure(s): LAPAROSCOPIC ROUX-EN-Y GASTRIC BYPASS WITH UPPER ENDOSCOPY (N/A) as a surgical intervention .  The patient's history has been reviewed, patient examined, no change in status, stable for surgery.  I have reviewed the patient's chart and labs.  Questions were answered to the patient's satisfaction.    [name]. [name], MD, FACS General, Bariatric, & Minimally Invasive Surgery [facility], [place]   [facility]

## 2015-08-06 NOTE — Op Note (Signed)
Preoperative diagnosis: Roux-en-Y gastric bypass  Postoperative diagnosis: Same   Procedure: Upper endoscopy   Surgeon: Feliciana Rossetti, M.D.  Anesthesia: Gen.   Indications for procedure: This patient was undergoing a Roux-en-Y gastric bypass.   Description of procedure: The endoscopy was placed in the mouth and into the oropharynx and under endoscopic vision it was advanced to the esophagogastric junction. The pouch was insufflated and no bleeding or bubbles were seen. The GEJ was identified at 38cm from the teeth. The anastomosis was seen at 50cm from the teeth. No bleeding or leaks were detected. The scope was withdrawn without difficulty.   Feliciana Rossetti, M.D. General, Bariatric, & Minimally Invasive Surgery [facility] [place]

## 2015-08-06 NOTE — Transfer of Care (Signed)
Immediate Anesthesia Transfer of Care Note  Patient: Edward [name]  Procedure(s) Performed: Procedure(s): LAPAROSCOPIC ROUX-EN-Y GASTRIC BYPASS WITH UPPER ENDOSCOPY (N/A) UPPER GI ENDOSCOPY  Patient Location: PACU  Anesthesia Type:General  Level of Consciousness:  sedated, patient cooperative and responds to stimulation  Airway & Oxygen Therapy:Patient Spontanous Breathing and Patient connected to face mask oxgen  Post-op Assessment:  Report given to PACU RN and Post -op Vital signs reviewed and stable  Post vital signs:  Reviewed and stable  Last Vitals:  Filed Vitals:   08/06/15 1900  BP: 143/76  Pulse:   Temp: 36.9 C  Resp:     Complications: No apparent anesthesia complications

## 2015-08-06 NOTE — Op Note (Signed)
Edward [name] [id number] 06-21-1971. 08/06/2015  Preoperative diagnosis:  1. Morbid obesity (BMI 51)  2. Hypertension 3. Diabetes Mellitus 2 4. Hypertension 5. Metabolic syndrome 6. Hypercholesterolemia with hypertriglyceridemia  Postoperative  diagnosis:  1. same  Surgical procedure: Laparoscopic Roux-en-Y gastric bypass (ante-colic, ante-gastric); upper endoscopy  Surgeon: Edward [name], M.D. FACS  Asst.: Edward Rossetti MD  Anesthesia: General plus 70cc exparel  Complications: None   EBL: Minimal   Drains: None   Disposition: PACU in good condition   Indications for procedure: 44yo male with morbid obesity who has been unsuccessful at sustained weight loss. The patient's comorbidities are listed above. We discussed the risk and benefits of surgery including but not limited to anesthesia risk, bleeding, infection, blood clot formation, anastomotic leak, anastomotic stricture, ulcer formation, death, respiratory complications, intestinal blockage, internal hernia, gallstone formation, vitamin and nutritional deficiencies, injury to surrounding structures, failure to lose weight and mood changes.   Description of procedure: Patient is brought to the operating room and general anesthesia induced. The patient had received preoperative broad-spectrum IV antibiotics and subcutaneous heparin. The abdomen was widely sterilely prepped with Chloraprep and draped. Patient timeout was performed and correct patient and procedure confirmed. Access was obtained with a 12 mm Optiview trocar in the left upper quadrant and pneumoperitoneum established without difficulty. Under direct vision 12 mm trocars were placed laterally in the right upper quadrant, right upper quadrant midclavicular line, and to the left and above the umbilicus for the camera port. A 5 mm trocar was placed laterally in the left upper quadrant. The omentum was brought into the upper abdomen and the transverse mesocolon elevated  and the ligament of Treitz clearly identified. A 40 cm biliopancreatic limb was then carefully measured from the ligament of Treitz. The small intestine was divided at this point with a single firing of the white load linear stapler. A Penrose drain was sutured to the end of the Roux-en-Y limb for later identification. A 100 cm Roux-en-Y limb was then carefully measured. At this point a side-to-side anastomosis was created between the Roux limb and the end of the biliopancreatic limb. This was accomplished with a single firing of the 57mm blue load linear stapler. The common enterotomy was closed with a running 2-0 Vicryl begun at either end of the enterotomy and tied centrally. [name] tissue sealant was placed over the anastomosis. The mesenteric defect was then closed with running 2-0 silk. The omentum was then divided with the harmonic scalpel up towards the transverse colon to allow mobility of the Roux limb toward the gastric pouch. The patient was then placed in steep reversed Trendelenburg. Through a 5 mm subxiphoid site the [name] retractor was placed and the left lobe of the liver elevated with excellent exposure of the upper stomach and hiatus. The angle of Hiss was then mobilized with the harmonic scalpel. He had a large amount of perigastric adipose tissue. A 5 -6 cm (a little larger due to his body habitus and concern for roux limb reaching the pouch) gastric pouch was then carefully measured along the lesser curve of the stomach. Dissection was carried along the lesser curve at this point with the Harmonic scalpel working carefully back toward the lesser sac at right angles to the lesser curve. The free lesser sac was then entered. After being sure all tubes were removed from the stomach an initial firing of the gold load 60 mm linear stapler was fired at right angles across the lesser curve for about 4 cm. The  gastric pouch was further mobilized posteriorly and then the pouch was completed with 4  further firings of the 60 mm blue load linear stapler  up through the previously dissected angle of His. It was ensured that the pouch was completely mobilized away from the gastric remnant. This created a nice tubular 6 cm gastric pouch. . The Roux limb was then brought up in an antecolic fashion with the candycane facing to the patient's left without undue tension. The gastrojejunostomy was created with an initial posterior row of 2-0 Vicryl between the Roux limb and the staple line of the gastric pouch. Enterotomies were then made in the gastric pouch and the Roux limb with the harmonic scalpel and at approximately 2-2-1/2 cm anastomosis was created with a single1/2  firing of the 60 mm (2.5 cm anastomosis) blue load linear stapler. The staple line was inspected and was intact without bleeding. The common enterotomy was then closed with running 2-0 Vicryl begun at either end and tied centrally. The [name] tube was then easily passed through the anastomosis and an outer anterior layer of running 2-0 Vicryl was placed. The [name] tube was removed. With the outlet of the gastrojejunostomy clamped and under saline irrigation the assistant performed upper endoscopy and with the gastric pouch tensely distended with air-there was no evidence of leak on this test. The pouch was desufflated. The [name] defect was closed with running 2-0 silk. The abdomen was inspected for any evidence of bleeding or bowel injury and everything looked fine. The [name] retractor was removed under direct vision after coating the anastomosis with [name] tissue sealant. All CO2 was evacuated and trochars removed. Skin incisions were closed with 4-0 monocryl in a subcuticular fashion followed by Dermabond. Sponge needle and instrument counts were correct. The patient was taken to the PACU in good condition.    Edward [name]. Edward Campanile, MD, FACS General, Bariatric, & Minimally Invasive Surgery [facility], [place]

## 2015-08-06 NOTE — H&P (View-Only) (Signed)
Weber Cooks [place] 07/27/2015 1:36 PM Location: [facility] Patient #: [id number] DOB: Nov 05, 1970 Married / Language: [place] / Race: White Male History of Present Illness Minerva Areola M. Kaida Games MD; 07/27/2015 2:06 PM) Patient words: preop.  The patient is a 44 year old male who presents for a pre-op visit. He comes in today for his preoperative visit. He was initially seen on August 18. His weight at that time was 381 pounds. He has been approved for laparoscopic Roux-en-Y gastric bypass. He denies any medical changes since he was initially seen. He did follow up with his endocrinologist after his evaluation labs revealed a hemoglobin A1c of 14.8. Due to some financial issues he had not been taking his insulin regularly. They have been monitoring him for the past couple weeks and have reported better blood glucose control. He denies any fever, chills, chest pain, shortness of breath, dyspnea on exertion, abdominal pain, nausea, vomiting, dysphagia. He denies any diarrhea or constipation. He reports feeling better and having more energy since his blood sugars are better controlled now. He denies any issues of sensations of the getting stuck in his upper neck or upper chest.  His upper GI showed no evidence of hiatal hernia. It did show some esophageal dysmotility. His abdominal ultrasound showed a single gallstone without any evidence of fatty liver. His lipid panel was abnormal. His total cholesterol level was 355, triglyceride level 300, HDL cholesterol 37, LDL cholesterol 258. This is actually improved for him. In comparing prior lipid panels his cholesterol level and triglyceride levels have been as high as 800. He reports a significant family history of hypercholesterolemia. Problem List/Past Medical Minerva Areola Elson Clan, MD; 07/27/2015 2:05 PM) MORBID OBESITY WITH BMI OF 50.0-59.9, ADULT (E66.01)  Other Problems Atilano Ina, MD; 07/27/2015 2:05 PM) Other disease, cancer,  significant illness Sleep Apnea HYPERCHOLESTEROLEMIA (E78.00) RHEUMATOID ARTHRITIS INVOLVING MULTIPLE SITES, UNSPECIFIED RHEUMATOID FACTOR PRESENCE (M06.9) DIABETES MELLITUS TYPE 2 IN OBESE (E11.9) ESSENTIAL HYPERTENSION (I10) ANXIETY AND DEPRESSION (F41.9, F32.9) Anxiety Disorder Back Pain  Past Surgical History Atilano Ina, MD; 07/27/2015 2:05 PM) Oral Surgery Hip Surgery Left. Knee Surgery Bilateral.  Diagnostic Studies History Atilano Ina, MD; 07/27/2015 2:05 PM) Colonoscopy never  Allergies (Ammie Eversole, LPN; 84/72/0721 1:37 PM) Demerol *ANALGESICS - OPIOID*  Medication History Atilano Ina, MD; 07/27/2015 2:05 PM) AmLODIPine Besylate (5MG  Tablet, Oral) Active. Invokana (300MG  Tablet, Oral) Active. Carvedilol (12.5MG  Tablet, Oral) Active. ClonazePAM (1MG  Tablet, Oral) Active. Zetia (10MG  Tablet, Oral) Active. NovoLOG FlexPen (100UNIT/ML Soln Pen-inj, Subcutaneous) Active. Lisinopril-Hydrochlorothiazide (20-12.5MG  Tablet, Oral) Active. MetFORMIN HCl ER (500MG  Tablet ER 24HR, Oral) Active. Livalo (2MG  Tablet, Oral) Active. TiZANidine HCl (4MG  Tablet, Oral) Active. TraMADol HCl (50MG  Tablet, Oral) Active. Venlafaxine HCl ER (75MG  Capsule ER 24HR, Oral) Active. Lantus (100UNIT/ML Solution, Subcutaneous) Active. Medications Reconciled OxyCODONE HCl (5MG /5ML Solution, 5-10 Milliliter Oral every four hours, as needed, Taken starting 07/27/2015) Active. Protonix (40MG  Tablet DR, 1 (one) Tablet DR Oral daily, Taken starting 07/27/2015) Active. Ondansetron (4MG  Tablet Disperse, 1 (one) Tablet Disperse Oral every six hours, as needed, Taken starting 07/27/2015) Active.  Social History Atilano Ina, MD; 07/27/2015 2:05 PM) Caffeine use Carbonated beverages, Tea. No drug use Tobacco use Former smoker. Alcohol use Occasional alcohol use.  Family History Atilano Ina, MD; 07/27/2015 2:05 PM) Arthritis Father, Mother. Cancer  Family Members In General, Father. Depression Brother. Thyroid problems Father. Diabetes Mellitus Brother, Father. Heart Disease Family Members In General. Hypertension Brother, Father, Mother.     Review of Systems (  Atilano Ina, MD; 07/27/2015 2:01 00) General Present- Fatigue and Night Sweats. Not Present- Appetite Loss, Chills, Fever, Weight Gain and Weight Loss. Skin Present- Dryness, Non-Healing Wounds and Rash. Not Present- Change in Wart/Mole, Hives, Jaundice, New Lesions and Ulcer. HEENT Present- Visual Disturbances and Wears glasses/contact lenses. Not Present- Earache, Hearing Loss, Hoarseness, Nose Bleed, Oral Ulcers, Ringing in the Ears, Seasonal Allergies, Sinus Pain, Sore Throat and Yellow Eyes. Respiratory Present- Snoring. Not Present- Bloody sputum, Chronic Cough, Difficulty Breathing and Wheezing. Breast Not Present- Breast Mass, Breast Pain, Nipple Discharge and Skin Changes. Cardiovascular Present- Swelling of Extremities. Not Present- Chest Pain, Difficulty Breathing Lying Down, Leg Cramps, Palpitations, Rapid Heart Rate and Shortness of Breath. Gastrointestinal Present- Excessive gas. Not Present- Abdominal Pain, Bloating, Bloody Stool, Change in Bowel Habits, Chronic diarrhea, Constipation, Difficulty Swallowing, Gets full quickly at meals, Hemorrhoids, Indigestion, Nausea, Rectal Pain and Vomiting. Male Genitourinary Present- Frequency, Nocturia, Urgency and Urine Leakage. Not Present- Blood in Urine, Change in Urinary Stream, Impotence and Painful Urination. Musculoskeletal Present- Back Pain, Joint Pain, Joint Stiffness, Muscle Pain, Muscle Weakness and Swelling of Extremities. Psychiatric Present- Anxiety and Depression. Not Present- Bipolar, Change in Sleep Pattern, Fearful and Frequent crying. Endocrine Present- Excessive Hunger and Heat Intolerance. Not Present- Cold Intolerance, Hair Changes and New Diabetes. Hematology Not Present- Easy Bruising, Excessive  bleeding, Gland problems, HIV and Persistent Infections.  Vitals (Ammie Eversole LPN; 61/60/7371 1:37 PM) 07/27/2015 1:36 PM Weight: 372.4 lb Height: 71.5in Body Surface Area: 2.92 m Body Mass Index: 51.21 kg/m Temp.: 98.71F(Oral)  Pulse: 72 (Regular)  BP: 132/80 (Sitting, Left Arm, Standard)     Physical Exam Minerva Areola M. Tanaysha Alkins MD; 07/27/2015 2:01 PM)  General Mental Status-Alert. General Appearance-Consistent with stated age. Hydration-Well hydrated. Voice-Normal. Note: morbidly obese; central truncal   Integumentary Note: knee scars   Head and Neck Head-normocephalic, atraumatic with no lesions or palpable masses. Trachea-midline. Thyroid Gland Characteristics - normal size and consistency.  Eye Eyeball - Bilateral-Extraocular movements intact. Sclera/Conjunctiva - Bilateral-No scleral icterus.  Chest and Lung Exam Chest and lung exam reveals -quiet, even and easy respiratory effort with no use of accessory muscles and on auscultation, normal breath sounds, no adventitious sounds and normal vocal resonance. Inspection Chest Wall - Normal. Back - normal.  Breast - Did not examine.  Cardiovascular Cardiovascular examination reveals -normal heart sounds, regular rate and rhythm with no murmurs and normal pedal pulses bilaterally.  Abdomen Inspection Inspection of the abdomen reveals - No Hernias. Skin - Scar - no surgical scars. Palpation/Percussion Palpation and Percussion of the abdomen reveal - Soft, Non Tender, No Rebound tenderness, No Rigidity (guarding) and No hepatosplenomegaly. Auscultation Auscultation of the abdomen reveals - Bowel sounds normal.  Peripheral Vascular Upper Extremity Palpation - Pulses bilaterally normal.  Neurologic Neurologic evaluation reveals -alert and oriented x 3 with no impairment of recent or remote memory. Mental Status-Normal.  Neuropsychiatric The patient's mood and affect are  described as -normal. Judgment and Insight-insight is appropriate concerning matters relevant to self.  Musculoskeletal Normal Exam - Left-Upper Extremity Strength Normal and Lower Extremity Strength Normal. Normal Exam - Right-Upper Extremity Strength Normal and Lower Extremity Strength Normal.  Lymphatic Head & Neck  General Head & Neck Lymphatics: Bilateral - Description - Normal. Axillary - Did not examine. Femoral & Inguinal - Did not examine.    Assessment & Plan Minerva Areola M. Prashant Glosser MD; 07/27/2015 2:05 PM)  MORBID OBESITY WITH BMI OF 50.0-59.9, ADULT (E66.01) Impression: We reviewed his preoperative workup. We discussed  results of his upper GI and abdominal ultrasound. He also reviewed his labs. I congratulated him on improvement of his diabetes management. We discussed the importance of monitoring his blood sugars regularly. We also discussed the importance of the preoperative diet. He has lost about 10 pounds since his initial visit. We discussed the typical postoperative course. He was given prescriptions today for postoperative pain, nausea, and reflux medications. He was also given instructions for his preoperative bowel prep.  Current Plans Pt Education - EMW_preopbariatric Started OxyCODONE HCl 5MG /5ML, 5-10 Milliliter every four hours, as needed, 200 Milliliter, 07/27/2015, No Refill. Started Protonix 40MG , 1 (one) Tablet DR daily, #30, 07/27/2015, No Refill. Started Ondansetron 4MG , 1 (one) Tablet Disperse every six hours, as needed, #25, 07/27/2015, No Refill. DIABETES MELLITUS TYPE 2 IN OBESE (E11.9)  RHEUMATOID ARTHRITIS INVOLVING MULTIPLE SITES, UNSPECIFIED RHEUMATOID FACTOR PRESENCE (M06.9) Impression: We discussed the need for him to avoid NSAIDs as much as possible after gastric bypass because of the risk of ulcer formation. We discussed that his regimen may have to be altered.  HYPERCHOLESTEROLEMIA (E78.00)  ANXIETY AND DEPRESSION (F41.9)  ESSENTIAL  HYPERTENSION (I10)  07/29/2015. , MD, FACS General, Bariatric, & Minimally Invasive Surgery [facility], [name]

## 2015-08-06 NOTE — Anesthesia Preprocedure Evaluation (Addendum)
Anesthesia Evaluation  Patient identified by MRN, date of birth, ID band Patient awake    Reviewed: Allergy & Precautions, H&P , NPO status , Patient's Chart, lab work & pertinent test results  Airway Mallampati: III  TM Distance: >3 FB Neck ROM: full    Dental no notable dental hx. (+) Dental Advisory Given   Pulmonary neg pulmonary ROS, former smoker,    Pulmonary exam normal breath sounds clear to auscultation       Cardiovascular Exercise Tolerance: Good hypertension, Pt. on medications Normal cardiovascular exam Rhythm:regular Rate:Normal     Neuro/Psych negative neurological ROS  negative psych ROS   GI/Hepatic negative GI ROS, Neg liver ROS, GERD  Medicated and Controlled,  Endo/Other  diabetes, Well Controlled, Type 2, Insulin Dependent, Oral Hypoglycemic AgentsMorbid obesity  Renal/GU negative Renal ROS  negative genitourinary   Musculoskeletal   Abdominal (+) + obese,   Peds  Hematology negative hematology ROS (+)   Anesthesia Other Findings   Reproductive/Obstetrics negative OB ROS                           Anesthesia Physical Anesthesia Plan  ASA: IV  Anesthesia Plan: General   Post-op Pain Management:    Induction: Intravenous  Airway Management Planned: Oral ETT  Additional Equipment:   Intra-op Plan:   Post-operative Plan: Extubation in OR  Informed Consent: I have reviewed the patients History and Physical, chart, labs and discussed the procedure including the risks, benefits and alternatives for the proposed anesthesia with the patient or authorized representative who has indicated his/her understanding and acceptance.   Dental Advisory Given  Plan Discussed with: CRNA and Surgeon  Anesthesia Plan Comments:        Anesthesia Quick Evaluation

## 2015-08-06 NOTE — Progress Notes (Signed)
Patient arrived to PACU without orders. Dr Leta Jungling called and order given for pain medicine .25 to .5 mg Dilaudid to be given every 5 minutes not to exceed 2 mg. Patient blood sugar upon arrival to PACU - blood sugar was 198. Patient has sliding scale Novolog orders to begin at 20:00. RN called the floor and to ask if we should cover at 20:00 spoke with Darl Pikes, RN who said to hold coverage until patient arrives to the 5th floor.

## 2015-08-06 NOTE — Progress Notes (Signed)
Pt has  Multiple bruises   Bilateral  Lower abdominal  due to stated insulins injections

## 2015-08-06 NOTE — Anesthesia Procedure Notes (Signed)
Procedure Name: Intubation Date/Time: 08/06/2015 3:44 PM Performed by: Jarvis Newcomer A Pre-anesthesia Checklist: Patient identified, Timeout performed, Emergency Drugs available, Patient being monitored and Suction available Patient Re-evaluated:Patient Re-evaluated prior to inductionOxygen Delivery Method: Circle system utilized Preoxygenation: Pre-oxygenation with 100% oxygen Intubation Type: IV induction Ventilation: Two handed mask ventilation required and Oral airway inserted - appropriate to patient size Laryngoscope Size: Mac and 4 Grade View: Grade II Tube type: Oral Tube size: 7.5 mm Number of attempts: 1 Airway Equipment and Method: Stylet Placement Confirmation: breath sounds checked- equal and bilateral,  ETT inserted through vocal cords under direct vision and positive ETCO2 Secured at: 23 cm Tube secured with: Tape Dental Injury: Teeth and Oropharynx as per pre-operative assessment  Comments: Two handed mask with oral airway. Oral airway placed. Grade 2-3 view.

## 2015-08-06 NOTE — Anesthesia Postprocedure Evaluation (Signed)
  Anesthesia Post-op Note  Patient: Edward [name]  Procedure(s) Performed: Procedure(s) (LRB): LAPAROSCOPIC ROUX-EN-Y GASTRIC BYPASS WITH UPPER ENDOSCOPY (N/A) UPPER GI ENDOSCOPY  Patient Location: PACU  Anesthesia Type: General  Level of Consciousness: awake and alert   Airway and Oxygen Therapy: Patient Spontanous Breathing  Post-op Pain: mild  Post-op Assessment: Post-op Vital signs reviewed, Patient's Cardiovascular Status Stable, Respiratory Function Stable, Patent Airway and No signs of Nausea or vomiting  Last Vitals:  Filed Vitals:   08/06/15 1900  BP: 143/76  Pulse:   Temp: 36.9 C  Resp:     Post-op Vital Signs: stable   Complications: No apparent anesthesia complications

## 2015-08-07 ENCOUNTER — Ambulatory Visit: Payer: BC Managed Care – PPO

## 2015-08-07 ENCOUNTER — Encounter (HOSPITAL_COMMUNITY): Payer: Self-pay | Admitting: General Surgery

## 2015-08-07 ENCOUNTER — Inpatient Hospital Stay (HOSPITAL_COMMUNITY): Payer: BC Managed Care – PPO

## 2015-08-07 LAB — COMPREHENSIVE METABOLIC PANEL
ALBUMIN: 3.1 g/dL — AB (ref 3.5–5.0)
ALK PHOS: 67 U/L (ref 38–126)
ALT: 20 U/L (ref 17–63)
AST: 33 U/L (ref 15–41)
Anion gap: 9 (ref 5–15)
BILIRUBIN TOTAL: 0.8 mg/dL (ref 0.3–1.2)
BUN: 17 mg/dL (ref 6–20)
CALCIUM: 8.9 mg/dL (ref 8.9–10.3)
CO2: 29 mmol/L (ref 22–32)
Chloride: 98 mmol/L — ABNORMAL LOW (ref 101–111)
Creatinine, Ser: 0.88 mg/dL (ref 0.61–1.24)
GFR calc Af Amer: >60 mL/min (ref >60)
GFR calc non Af Amer: >60 mL/min (ref >60)
GLUCOSE: 190 mg/dL — AB (ref 65–99)
Potassium: 4.3 mmol/L (ref 3.5–5.1)
SODIUM: 136 mmol/L (ref 135–145)
TOTAL PROTEIN: 6.9 g/dL (ref 6.5–8.1)

## 2015-08-07 LAB — GLUCOSE, CAPILLARY
GLUCOSE-CAPILLARY: 167 mg/dL — AB (ref 65–99)
GLUCOSE-CAPILLARY: 188 mg/dL — AB (ref 65–99)
GLUCOSE-CAPILLARY: 212 mg/dL — AB (ref 65–99)
GLUCOSE-CAPILLARY: 212 mg/dL — AB (ref 65–99)
Glucose-Capillary: 135 mg/dL — ABNORMAL HIGH (ref 65–99)
Glucose-Capillary: 146 mg/dL — ABNORMAL HIGH (ref 65–99)
Glucose-Capillary: 177 mg/dL — ABNORMAL HIGH (ref 65–99)

## 2015-08-07 LAB — CBC WITH DIFFERENTIAL/PLATELET
Basophils Absolute: 0 10*3/uL (ref 0.0–0.1)
Basophils Relative: 0 %
EOS PCT: 0 %
Eosinophils Absolute: 0 10*3/uL (ref 0.0–0.7)
HEMATOCRIT: 48 % (ref 39.0–52.0)
HEMOGLOBIN: 15.4 g/dL (ref 13.0–17.0)
LYMPHS ABS: 0.9 10*3/uL (ref 0.7–4.0)
LYMPHS PCT: 7 %
MCH: 28.8 pg (ref 26.0–34.0)
MCHC: 32.1 g/dL (ref 30.0–36.0)
MCV: 89.7 fL (ref 78.0–100.0)
MONO ABS: 0.7 10*3/uL (ref 0.1–1.0)
MONOS PCT: 5 %
Neutro Abs: 11.9 10*3/uL — ABNORMAL HIGH (ref 1.7–7.7)
Neutrophils Relative %: 88 %
Platelets: 276 10*3/uL (ref 150–400)
RBC: 5.35 MIL/uL (ref 4.22–5.81)
RDW: 14.3 % (ref 11.5–15.5)
WBC: 13.4 10*3/uL — ABNORMAL HIGH (ref 4.0–10.5)

## 2015-08-07 LAB — HEMOGLOBIN AND HEMATOCRIT, BLOOD
HCT: 50.4 % (ref 39.0–52.0)
Hemoglobin: 16.3 g/dL (ref 13.0–17.0)

## 2015-08-07 MED ORDER — CARVEDILOL 12.5 MG PO TABS
12.5000 mg | ORAL_TABLET | Freq: Two times a day (BID) | ORAL | Status: DC
  Administered 2015-08-07 – 2015-08-08 (×2): 12.5 mg via ORAL
  Filled 2015-08-07 (×4): qty 1

## 2015-08-07 MED ORDER — VENLAFAXINE HCL 75 MG PO TABS
75.0000 mg | ORAL_TABLET | Freq: Three times a day (TID) | ORAL | Status: DC
  Administered 2015-08-07 – 2015-08-08 (×3): 75 mg via ORAL
  Filled 2015-08-07 (×5): qty 1

## 2015-08-07 MED ORDER — IOHEXOL 300 MG/ML  SOLN
50.0000 mL | Freq: Once | INTRAMUSCULAR | Status: DC | PRN
  Administered 2015-08-07: 50 mL via ORAL
  Filled 2015-08-07: qty 50

## 2015-08-07 MED ORDER — LEVOFLOXACIN IN D5W 750 MG/150ML IV SOLN
750.0000 mg | Freq: Every day | INTRAVENOUS | Status: DC
  Administered 2015-08-07 (×2): 750 mg via INTRAVENOUS
  Filled 2015-08-07 (×2): qty 150

## 2015-08-07 NOTE — Plan of Care (Signed)
Problem: Food- and Nutrition-Related Knowledge Deficit (NB-1.1) Goal: Nutrition education Formal process to instruct or train a patient/client in a skill or to impart knowledge to help patients/clients voluntarily manage or modify food choices and eating behavior to maintain or improve health. Outcome: Completed/Met Date Met:  08/07/15 Nutrition Education Note  Received consult for diet education per DROP protocol.   Discussed 2 week post op diet with pt. Emphasized that liquids must be non carbonated, non caffeinated, and sugar free. Fluid goals discussed. Pt to follow up with outpatient bariatric RD for further diet progression after 2 weeks. Multivitamins and minerals also reviewed. Teach back method used, pt expressed understanding, expect good compliance.   Diet: First 2 Weeks  You will see the nutritionist about two (2) weeks after your surgery. The nutritionist will increase the types of foods you can eat if you are handling liquids well:  If you have severe vomiting or nausea and cannot handle clear liquids lasting longer than 1 day, call your surgeon  Protein Shake  Drink at least 2 ounces of shake 5-6 times per day  Each serving of protein shakes (usually 8 - 12 ounces) should have a minimum of:  15 grams of protein  And no more than 5 grams of carbohydrate  Goal for protein each day:  Men = 80 grams per day  Women = 60 grams per day  Protein powder may be added to fluids such as non-fat milk or Lactaid milk or Soy milk (limit to 35 grams added protein powder per serving)   Hydration  Slowly increase the amount of water and other clear liquids as tolerated (See Acceptable Fluids)  Slowly increase the amount of protein shake as tolerated  Sip fluids slowly and throughout the day  May use sugar substitutes in small amounts (no more than 6 - 8 packets per day; i.e. Splenda)   Fluid Goal  The first goal is to drink at least 8 ounces of protein shake/drink per day (or as directed  by the nutritionist); some examples of protein shakes are Syntrax Nectar, Adkins Advantage, EAS Edge HP, and Unjury. See handout from pre-op Bariatric Education Class:  Slowly increase the amount of protein shake you drink as tolerated  You may find it easier to slowly sip shakes throughout the day  It is important to get your proteins in first  Your fluid goal is to drink 64 - 100 ounces of fluid daily  It may take a few weeks to build up to this  32 oz (or more) should be clear liquids  And  32 oz (or more) should be full liquids (see below for examples)  Liquids should not contain sugar, caffeine, or carbonation   Clear Liquids:  Water or Sugar-free flavored water (i.e. Fruit H2O, Propel)  Decaffeinated coffee or tea (sugar-free)  Crystal Lite, Wyler's Lite, Minute Maid Lite  Sugar-free Jell-O  Bouillon or broth  Sugar-free Popsicle: *Less than 20 calories each; Limit 1 per day   Full Liquids:  Protein Shakes/Drinks + 2 choices per day of other full liquids  Full liquids must be:  No More Than 12 grams of Carbs per serving  No More Than 3 grams of Fat per serving  Strained low-fat cream soup  Non-Fat milk  Fat-free Lactaid Milk  Sugar-free yogurt (Dannon Lite & Fit, Greek yogurt)     Riven Mabile, MS, RD, LDN Pager: [phone] After Hours Pager: [phone]        

## 2015-08-07 NOTE — Progress Notes (Signed)
Patient alert and oriented, Post op day 1.  Provided support and encouragement.  Encouraged pulmonary toilet, ambulation and small sips of liquids when swallow study returned satisfactorily.  All questions answered.  Will continue to monitor. 

## 2015-08-07 NOTE — Progress Notes (Signed)
1 Day Post-Op  Subjective: "roungh night" had burning when urinated so that they did i&o cath and UA which was very uncomfortable. Ambulated. Has back pain. Knee pain is better. Not much abd pain  Objective: Vital signs in last 24 hours: Temp:  [97.8 F (36.6 C)-98.7 F (37.1 C)] 98.3 F (36.8 C) (10/25 1022) Pulse Rate:  [91-109] 98 (10/25 1022) Resp:  [15-20] 18 (10/25 1022) BP: (124-154)/(72-91) 124/72 mmHg (10/25 1022) SpO2:  [90 %-100 %] 95 % (10/25 1022) Weight:  [168.965 kg (372 lb 8 oz)] 168.965 kg (372 lb 8 oz) (10/24 1136)    Intake/Output from previous day: 10/24 0701 - 10/25 0700 In: 3879.2 [I.V.:3879.2] Out: 3075 [Urine:3025; Blood:50] Intake/Output this shift: Total I/O In: 0  Out: 800 [Urine:800]  Alert, nad, resting comfortably. Looks good cta b/l Reg Soft, nd, incisions c/d/i +SCDs  Lab Results:   Recent Labs  08/06/15 2020 08/07/15 0549  WBC  --  13.4*  HGB 16.3 15.4  HCT 50.4 48.0  PLT  --  276   BMET  Recent Labs  08/07/15 0549  NA 136  K 4.3  CL 98*  CO2 29  GLUCOSE 190*  BUN 17  CREATININE 0.88  CALCIUM 8.9    PT/INR No results for input(s): LABPROT, INR in the last 72 hours. ABG No results for input(s): PHART, HCO3 in the last 72 hours.  Invalid input(s): PCO2, PO2  Studies/Results: No results found.  Anti-infectives: Anti-infectives    Start     Dose/Rate Route Frequency Ordered Stop   08/07/15 0245  levofloxacin (LEVAQUIN) IVPB 750 mg     750 mg 100 mL/hr over 90 Minutes Intravenous Daily at bedtime 08/07/15 0241     08/06/15 1143  cefoTEtan in Dextrose 5% (CEFOTAN) IVPB 2 g     2 g Intravenous On call to O.R. 08/06/15 1143 08/06/15 1547      Assessment/Plan: s/p Procedure(s): LAPAROSCOPIC ROUX-EN-Y GASTRIC BYPASS WITH UPPER ENDOSCOPY (N/A) UPPER GI ENDOSCOPY  Vitals ok. Going for swallow. If ok. Start POD 1 diet Blood sugars ok Cont VTE prophylaxis Ambulate, IS  [name]. [name], MD, FACS General,  Bariatric, & Minimally Invasive Surgery [facility], [place]   LOS: 1 day    Atilano Ina 08/07/2015

## 2015-08-07 NOTE — Progress Notes (Signed)
ANTIBIOTIC CONSULT NOTE - INITIAL  Pharmacy Consult for Levaquin Indication: Infection  Allergies  Allergen Reactions  . Demerol [Meperidine]     Swelling of hands, neck    Patient Measurements: Height: 6' (182.9 cm) Weight: (!) 372 lb 8 oz (168.965 kg) IBW/kg (Calculated) : 77.6   Vital Signs: Temp: 98.2 F (36.8 C) (10/25 0151) Temp Source: Oral (10/25 0151) BP: 150/88 mmHg (10/25 0151) Pulse Rate: 105 (10/25 0151) Intake/Output from previous day: 10/24 0701 - 10/25 0700 In: 2879.2 [I.V.:2879.2] Out: 2325 [Urine:2275; Blood:50] Intake/Output from this shift: Total I/O In: 879.2 [I.V.:879.2] Out: 1675 [Urine:1675]  Labs:  Recent Labs  08/06/15 2020  HGB 16.3   Estimated Creatinine Clearance: 192.3 mL/min (by C-G formula based on Cr of 0.78). No results for input(s): VANCOTROUGH, VANCOPEAK, VANCORANDOM, GENTTROUGH, GENTPEAK, GENTRANDOM, TOBRATROUGH, TOBRAPEAK, TOBRARND, AMIKACINPEAK, AMIKACINTROU, AMIKACIN in the last 72 hours.   Microbiology: No results found for this or any previous visit (from the past 720 hour(s)).  Medical History: Past Medical History  Diagnosis Date  . Hypertension   . Hyperlipidemia   . Rheumatic fever age 44  . Diabetes mellitus without complication (HCC)     type 2  . Depression   . Anxiety   . Ankylosing spondylitis (HCC)   . Headache     episodic migraines  . Arthritis   . Morbid obesity (HCC)     Medications:  Prescriptions prior to admission  Medication Sig Dispense Refill Last Dose  . amLODipine (NORVASC) 5 MG tablet Take 5 mg by mouth every morning.    08/06/2015 at 0530  . butalbital-acetaminophen-caffeine (ESGIC) 50-325-40 MG tablet Take 1 tablet by mouth every 4 (four) hours as needed for headache or migraine.    2weeks  . canagliflozin (INVOKANA) 300 MG TABS tablet Take 300 mg by mouth daily before breakfast.   08/04/2015 at 0530  . carvedilol (COREG) 12.5 MG tablet Take 12.5 mg by mouth 2 (two) times daily with a  meal.   08/06/2015 at 0530  . clonazePAM (KLONOPIN) 1 MG tablet Take 1 mg by mouth 2 (two) times daily.   08/06/2015 at 0530.  Marland Kitchen ezetimibe (ZETIA) 10 MG tablet Take 10 mg by mouth every morning.    08/04/2015 at 0530  . insulin aspart (NOVOLOG) 100 UNIT/ML injection Inject 16-18 Units into the skin 3 (three) times daily before meals. Sliding scale. (goes up two units for every 20)   08/05/2015 at 1200  . insulin glargine (LANTUS) 100 UNIT/ML injection Inject 28 Units into the skin at bedtime.   08/05/2015 at 1930  . lisinopril-hydrochlorothiazide (PRINZIDE,ZESTORETIC) 20-12.5 MG per tablet Take 1 tablet by mouth every morning.    08/06/2015 at 0530  . metFORMIN (GLUCOPHAGE) 500 MG tablet Take by mouth 2 (two) times daily with a meal.   08/04/2015 at 1800  . Pitavastatin Calcium (LIVALO) 2 MG TABS Take 1 tablet by mouth every morning.    08/04/2015 at 1930  . tiZANidine (ZANAFLEX) 4 MG capsule Take 4 mg by mouth 3 (three) times daily.   08/05/2015 at 1930  . traMADol (ULTRAM) 50 MG tablet Take 50 mg by mouth every 6 (six) hours as needed for moderate pain.    08/06/2015 at 0530  . venlafaxine (EFFEXOR) 75 MG tablet Take 75 mg by mouth 3 (three) times daily.    08/06/2015 at 0830  . pantoprazole (PROTONIX) 40 MG tablet Take 40 mg by mouth daily. For after surgery.   notstarted   Scheduled:  . enoxaparin (  LOVENOX) injection  30 mg Subcutaneous Q12H  . fentaNYL      . HYDROmorphone      . Influenza vac split quadrivalent PF  0.5 mL Intramuscular Tomorrow-1000  . insulin aspart  0-20 Units Subcutaneous 6 times per day  . levofloxacin (LEVAQUIN) IV  750 mg Intravenous QHS  . methocarbamol (ROBAXIN)  IV  500 mg Intravenous 3 times per day  . pantoprazole (PROTONIX) IV  40 mg Intravenous QHS  . [START ON 08/08/2015] protein supplement shake  2 oz Oral QID   Infusions:  . 0.45 % NaCl with KCl 20 mEq / L 125 mL/hr at 08/06/15 2122   Assessment: 44 yoM s/p gastric bypass on 10/24.  Levaquin per Rx.     Goal of Therapy:  Treat infection  Plan:   Levaquin 750mg  IV q24h  R 08/07/2015,5:47 AM

## 2015-08-07 NOTE — Care Management Note (Signed)
Case Management Note  Patient Details  Name: Edward [name] MRN: [id number] Date of Birth: [birth year]  Subjective/Objective:     Laparoscopic Roux-en-Y gastric bypass                Action/Plan: Discharge planning, no new needs  Expected Discharge Date:  08/08/15               Expected Discharge Plan:  Home/Self Care  In-House Referral:  NA  Discharge planning Services  NA  Post Acute Care Choice:  NA Choice offered to:  NA  DME Arranged:  N/A DME Agency:  NA  HH Arranged:  NA HH Agency:  NA  Status of Service:  Completed, signed off  Medicare Important Message Given:    Date Medicare IM Given:    Medicare IM give by:    Date Additional Medicare IM Given:    Additional Medicare Important Message give by:     If discussed at Long Length of Stay Meetings, dates discussed:    Additional Comments:  Alexis Goodell, RN 08/07/2015, 1:58 PM

## 2015-08-08 DIAGNOSIS — E7800 Pure hypercholesterolemia, unspecified: Secondary | ICD-10-CM | POA: Diagnosis present

## 2015-08-08 DIAGNOSIS — E8881 Metabolic syndrome: Secondary | ICD-10-CM | POA: Diagnosis present

## 2015-08-08 DIAGNOSIS — E781 Pure hyperglyceridemia: Secondary | ICD-10-CM | POA: Diagnosis present

## 2015-08-08 DIAGNOSIS — E1169 Type 2 diabetes mellitus with other specified complication: Secondary | ICD-10-CM

## 2015-08-08 DIAGNOSIS — I10 Essential (primary) hypertension: Secondary | ICD-10-CM | POA: Diagnosis present

## 2015-08-08 DIAGNOSIS — E669 Obesity, unspecified: Secondary | ICD-10-CM

## 2015-08-08 DIAGNOSIS — E785 Hyperlipidemia, unspecified: Secondary | ICD-10-CM | POA: Diagnosis present

## 2015-08-08 DIAGNOSIS — M459 Ankylosing spondylitis of unspecified sites in spine: Secondary | ICD-10-CM | POA: Diagnosis present

## 2015-08-08 LAB — CBC WITH DIFFERENTIAL/PLATELET
Basophils Absolute: 0 10*3/uL (ref 0.0–0.1)
Basophils Relative: 0 %
Eosinophils Absolute: 0.1 10*3/uL (ref 0.0–0.7)
Eosinophils Relative: 1 %
HCT: 50.1 % (ref 39.0–52.0)
HEMOGLOBIN: 16.2 g/dL (ref 13.0–17.0)
LYMPHS ABS: 1.9 10*3/uL (ref 0.7–4.0)
LYMPHS PCT: 15 %
MCH: 29.3 pg (ref 26.0–34.0)
MCHC: 32.3 g/dL (ref 30.0–36.0)
MCV: 90.8 fL (ref 78.0–100.0)
Monocytes Absolute: 1.3 10*3/uL — ABNORMAL HIGH (ref 0.1–1.0)
Monocytes Relative: 11 %
NEUTROS PCT: 73 %
Neutro Abs: 9.3 10*3/uL — ABNORMAL HIGH (ref 1.7–7.7)
Platelets: 283 10*3/uL (ref 150–400)
RBC: 5.52 MIL/uL (ref 4.22–5.81)
RDW: 14.6 % (ref 11.5–15.5)
WBC: 12.5 10*3/uL — AB (ref 4.0–10.5)

## 2015-08-08 LAB — GLUCOSE, CAPILLARY
GLUCOSE-CAPILLARY: 167 mg/dL — AB (ref 65–99)
Glucose-Capillary: 148 mg/dL — ABNORMAL HIGH (ref 65–99)
Glucose-Capillary: 151 mg/dL — ABNORMAL HIGH (ref 65–99)
Glucose-Capillary: 182 mg/dL — ABNORMAL HIGH (ref 65–99)

## 2015-08-08 LAB — URINE CULTURE: Culture: NO GROWTH

## 2015-08-08 MED ORDER — ENOXAPARIN SODIUM 40 MG/0.4ML ~~LOC~~ SOLN: 40.0000 mg | SUBCUTANEOUS | Status: DC

## 2015-08-08 MED ORDER — ENOXAPARIN (LOVENOX) PATIENT EDUCATION KIT
PACK | Freq: Once | Status: AC
  Administered 2015-08-08: 09:00:00
  Filled 2015-08-08: qty 1

## 2015-08-08 MED ORDER — OXYCODONE HCL 5 MG/5ML PO SOLN: 5.0000 mg | ORAL | Status: DC | PRN

## 2015-08-08 NOTE — Discharge Summary (Signed)
Physician Discharge Summary  Edward [name] QTM:[id number] DOB: [birth year] DOA: 08/06/2015  PCP: Sid Falcon, MD  Admit date: 08/06/2015 Discharge date: 08/08/2015  Recommendations for Outpatient Follow-up:   Follow-up Information    Follow up with Atilano Ina, MD. Go on 08/23/2015.   Specialty:  General Surgery   Why:  For Post-Op Check at 0915   Contact information:   [address] [place] [address] [phone]       Follow up with PATEL,DHAVAL, MD. Schedule an appointment as soon as possible for a visit in 2 weeks.   Specialty:  Endocrinology   Contact information:   [address] [place] [address] [phone]      Discharge Diagnoses:  Principal Problem:   Morbid obesity with BMI of 50.0-59.9, adult (HCC) Active Problems:   S/P gastric bypass   HTN (hypertension)   Diabetes mellitus type 2 in obese [facility])   Metabolic syndrome   Hypertriglyceridemia   Hypercholesteremia   Spondyloarthritis   Surgical Procedure: Laparoscopic Roux-en-Y gastric bypass, upper endoscopy  Discharge Condition: Good Disposition: Home  Diet recommendation: Postoperative gastric bypass diet  Filed Weights   08/06/15 1136 08/08/15 0140  Weight: 168.965 kg (372 lb 8 oz) 165.9 kg (365 lb 11.9 oz)     Hospital Course:  The patient was admitted for a planned laparoscopic Roux-en-Y gastric bypass.he had been off his NSAIDs for one week and was complaining of worsening spondylosis arthritis pain on presentation.. Please see operative note. Preoperatively the patient was given 5000 units of subcutaneous heparin for DVT prophylaxis. Postoperative prophylactic Lovenox dosing was started on the morning of postoperative day 1. The patient underwent an upper GI on postoperative day 1 which demonstrated no extravasation of contrast and emptying of the contrast into the Roux limb. The patient was started on ice chips and water which they tolerated. He was  started back on his carvedilol.  We managed his blood sugars with sliding scale insulin.  His blood sugars stay less than 200 .he was complaining of more joint pain and back pain due to his chronic arthritis.  His pain was managed with muscle relaxant and narcotic. On postoperative day 2 The patient's diet was advanced to protein shakes which they also tolerated. The patient was ambulating without difficulty. Their vital signs are stable without fever or tachycardia. Their hemoglobin had remained stable. Because of his morbid obesity I decided to send him home on extended Lovenox DVT prophylaxis.  He received education on how to administer it.  Because his blood sugars were well managed with just sliding scale he was just sent home on sliding scale.  I encouraged him to follow-up with his endocrinologist either electronically or by phone within 1 week of surgery and to keep a detailed blood sugar journal. The patient had received discharge instructions and counseling. They were deemed stable for discharge.  BP 130/88 mmHg  Pulse 94  Temp(Src) 98.6 F (37 C) (Oral)  Resp 18  Ht 6' (1.829 m)  Wt 165.9 kg (365 lb 11.9 oz)  BMI 49.59 kg/m2  SpO2 92%  Gen: alert, NAD, non-toxic appearing Pupils: equal, no scleral icterus Pulm: Lungs clear to auscultation, symmetric chest rise CV: regular rate and rhythm Abd: soft, nontender, nondistended. Healing trocar sites. No cellulitis. No incisional hernia Ext: no edema, no calf tenderness Skin: no rash, no jaundice  Discharge Instructions      Discharge Instructions    Ambulate hourly while awake  Complete by:  As directed      Call MD for:  difficulty breathing, headache or visual disturbances    Complete by:  As directed      Call MD for:  persistant dizziness or light-headedness    Complete by:  As directed      Call MD for:  persistant nausea and vomiting    Complete by:  As directed      Call MD for:  redness, tenderness, or signs of  infection (pain, swelling, redness, odor or green/yellow discharge around incision site)    Complete by:  As directed      Call MD for:  severe uncontrolled pain    Complete by:  As directed      Call MD for:  temperature >101 F    Complete by:  As directed      Diet bariatric full liquid    Complete by:  As directed      Discharge instructions    Complete by:  As directed   See bariatric surgery discharge instructions Keep a blood sugar log. Check blood sugars 4x a day. Use the sliding scale your endrocrinologist gave you Do not take long acting insulin right now Do not automatically give yourself insulin without checking your blood sugar first.  Email thru your health portal or call your diabetes doctor your blood sugar log early next week or sooner     Incentive spirometry    Complete by:  As directed   Perform hourly while awake            Medication List    STOP taking these medications        ESGIC 50-325-40 MG tablet  Generic drug:  butalbital-acetaminophen-caffeine     insulin glargine 100 UNIT/ML injection  Commonly known as:  LANTUS     INVOKANA 300 MG Tabs tablet  Generic drug:  canagliflozin     lisinopril-hydrochlorothiazide 20-12.5 MG tablet  Commonly known as:  PRINZIDE,ZESTORETIC     metFORMIN 500 MG tablet  Commonly known as:  GLUCOPHAGE     traMADol 50 MG tablet  Commonly known as:  ULTRAM      TAKE these medications        amLODipine 5 MG tablet  Commonly known as:  NORVASC  Take 5 mg by mouth every morning.  Notes to Patient:  Monitor Blood Pressure Daily and keep a log for primary care physician.  You may need to make changes to your medications with rapid weight loss.        carvedilol 12.5 MG tablet  Commonly known as:  COREG  Take 12.5 mg by mouth 2 (two) times daily with a meal.  Notes to Patient:  Monitor Blood Pressure Daily and keep a log for primary care physician.  You may need to make changes to your medications with rapid weight  loss.        clonazePAM 1 MG tablet  Commonly known as:  KLONOPIN  Take 1 mg by mouth 2 (two) times daily.     enoxaparin 40 MG/0.4ML injection  Commonly known as:  LOVENOX  Inject 0.4 mLs (40 mg total) into the skin daily.     ezetimibe 10 MG tablet  Commonly known as:  ZETIA  Take 10 mg by mouth every morning.     insulin aspart 100 UNIT/ML injection  Commonly known as:  novoLOG  Inject 16-18 Units into the skin 3 (three) times daily before meals. Sliding scale. (goes  up two units for every 20)  Notes to Patient:  Monitor Blood Sugar Frequently and keep a log for primary care physician, you may need to adjust medication dosage with rapid weight loss.        LIVALO 2 MG Tabs  Generic drug:  Pitavastatin Calcium  Take 1 tablet by mouth every morning.     oxyCODONE 5 MG/5ML solution  Commonly known as:  ROXICODONE  Take 5-10 mLs (5-10 mg total) by mouth every 4 (four) hours as needed for moderate pain or severe pain.     pantoprazole 40 MG tablet  Commonly known as:  PROTONIX  Take 40 mg by mouth daily. For after surgery.     tiZANidine 4 MG capsule  Commonly known as:  ZANAFLEX  Take 4 mg by mouth 3 (three) times daily.     venlafaxine 75 MG tablet  Commonly known as:  EFFEXOR  Take 75 mg by mouth 3 (three) times daily.       Follow-up Information    Follow up with Atilano Ina, MD. Go on 08/23/2015.   Specialty:  General Surgery   Why:  For Post-Op Check at 0915   Contact information:   [address] [place] [address] [phone]       Follow up with PATEL,DHAVAL, MD. Schedule an appointment as soon as possible for a visit in 2 weeks.   Specialty:  Endocrinology   Contact information:   [address] [place] [address] [phone]        The results of significant diagnostics from this hospitalization (including imaging, microbiology, ancillary and laboratory) are listed below for reference.    Significant Diagnostic  Studies: Dg Ugi W/water Sol Cm  08/07/2015  CLINICAL DATA:  44 year old male postoperative day 1 laparoscopic Roux-en-Y gastric bypass (ante-colic, ante-gastric). Morbid obesity. Initial encounter. EXAM: WATER SOLUBLE UPPER GI SERIES TECHNIQUE: Single-column upper GI series was performed using water soluble contrast. CONTRAST:  24mL OMNIPAQUE IOHEXOL 300 MG/ML  SOLN COMPARISON:  Preoperative upper GI 06/22/2015 FLUOROSCOPY TIME:  Radiation Exposure Index (as provided by the fluoroscopic device): If the device does not provide the exposure index: Fluoroscopy Time (in minutes and seconds):  1 minutes 33 seconds Number of Acquired Images:  0 FINDINGS: Preprocedural scout view of the abdomen demonstrates a left epigastrium staple line and negative bowel gas pattern. The patient was given a small sip of water which she tolerated without difficulty. He was then given 50 mL Omnipaque 300 contrast. No obstruction to the forward flow of contrast from the esophagus into the proximal stomach and with prompt transit through the gastrojejunostomy. Both upstream and downstream J loops were opacified. No leak or adverse features identified. IMPRESSION: No adverse features status post Roux-en-Y gastric bypass. Electronically Signed   By: Odessa Fleming M.D.   On: 08/07/2015 12:17    Labs: Basic Metabolic Panel:  Recent Labs Lab 08/02/15 0935 08/07/15 0549  NA 140 136  K 4.3 4.3  CL 103 98*  CO2 30 29  GLUCOSE 58* 190*  BUN 17 17  CREATININE 0.78 0.88  CALCIUM 9.7 8.9   Liver Function Tests:  Recent Labs Lab 08/02/15 0935 08/07/15 0549  AST 25 33  ALT 18 20  ALKPHOS 78 67  BILITOT 0.3 0.8  PROT 7.6 6.9  ALBUMIN 3.7 3.1*    CBC:  Recent Labs Lab 08/02/15 0935 08/06/15 2020 08/07/15 0549 08/07/15 1603 08/08/15 0500  WBC 8.9  --  13.4*  --  12.5*  NEUTROABS 5.0  --  11.9*  --  9.3*  HGB 16.6 16.3 15.4 16.3 16.2  HCT 51.8 50.4 48.0 50.4 50.1  MCV 90.4  --  89.7  --  90.8  PLT 290  --  276  --   283    CBG:  Recent Labs Lab 08/07/15 2017 08/08/15 0005 08/08/15 0409 08/08/15 0739 08/08/15 1211  GLUCAP 177* 182* 148* 151* 167*    Principal Problem:   Morbid obesity with BMI of 50.0-59.9, adult (HCC) Active Problems:   S/P gastric bypass   HTN (hypertension)   Diabetes mellitus type 2 in obese (HCC)   Metabolic syndrome   Hypertriglyceridemia   Hypercholesteremia   Spondyloarthritis   Time coordinating discharge: 20 minutes  Signed:  Atilano Ina, MD [facility], [place] [phone] 08/08/2015, 12:37 PM

## 2015-08-08 NOTE — Progress Notes (Signed)
Patient given instructions on Lovenox.  Lovenox teaching kit provided.  Patient verbalized proper technique for injecting Lovenox, signs and symptoms of bleeding, and proper disposal of Lovenox after injection.

## 2015-08-08 NOTE — Progress Notes (Signed)
Patient alert and oriented, pain is controlled. Patient is tolerating fluids,  advanced to protein shake today, patient tolerated well. Reviewed Gastric Bypass discharge instructions with patient and patient is able to articulate understanding. Provided information on BELT program, Support Group and [facility] outpatient pharmacy. All questions answered, will continue to monitor.    

## 2015-08-08 NOTE — Progress Notes (Signed)
Discharge instructions given to patient. Questions answered 

## 2015-08-08 NOTE — Discharge Instructions (Addendum)
° ° ° °GASTRIC BYPASS/SLEEVE ° Home Care Instructions ° ° These instructions are to help you care for yourself when you go home. ° °Call: If you have any problems. °• Call [phone] and ask for the surgeon on call °• If you need immediate assistance come to the ER at [facility]. Tell the ER staff you are a new post-op gastric bypass or gastric sleeve patient  °Signs and symptoms to report: • Severe  vomiting or nausea °o If you cannot handle clear liquids for longer than 1 day, call your surgeon °• Abdominal pain which does not get better after taking your pain medication °• Fever greater than 100.4°  F and chills °• Heart rate over 100 beats a minute °• Trouble breathing °• Chest pain °• Redness,  swelling, drainage, or foul odor at incision (surgical) sites °• If your incisions open or pull apart °• Swelling or pain in calf (lower leg) °• Diarrhea (Loose bowel movements that happen often), frequent watery, uncontrolled bowel movements °• Constipation, (no bowel movements for 3 days) if this happens: °o Take Milk of Magnesia, 2 tablespoons by mouth, 3 times a day for 2 days if needed °o Stop taking Milk of Magnesia once you have had a bowel movement °o Call your doctor if constipation continues °Or °o Take Miralax  (instead of Milk of Magnesia) following the label instructions °o Stop taking Miralax once you have had a bowel movement °o Call your doctor if constipation continues °• Anything you think is “abnormal for you” °  °Normal side effects after surgery: • Unable to sleep at night or unable to concentrate °• Irritability °• Being tearful (crying) or depressed ° °These are common complaints, possibly related to your anesthesia, stress of surgery, and change in lifestyle, that usually go away a few weeks after surgery. If these feelings continue, call your medical doctor.  °Wound Care: You may have surgical glue, steri-strips, or staples over your incisions after surgery °• Surgical glue: Looks like clear  film over your incisions and will wear off a little at a time °• Steri-strips: Adhesive strips of tape over your incisions. You may notice a yellowish color on skin under the steri-strips. This is used to make the steri-strips stick better. Do not pull the steri-strips off - let them fall off °• Staples: Staples may be removed before you leave the hospital °o If you go home with staples, call [facility] for an appointment with your surgeon’s nurse to have staples removed 10 days after surgery, [phone] °• Showering: You may shower two (2) days after your surgery unless your surgeon tells you differently °o Wash gently around incisions with warm soapy water, rinse well, and gently pat dry °o If you have a drain (tube from your incision), you may need someone to hold this while you shower °o No tub baths until staples are removed and incisions are healed °  °Medications: • Medications should be liquid or crushed if larger than the size of a dime °• Extended release pills (medication that releases a little bit at a time through the  day) should not be crushed °• Depending on the size and number of medications you take, you may need to space (take a few throughout the day)/change the time you take your medications so that you do not over-fill your pouch (smaller stomach) °• Make sure you follow-up with you primary care physician to make medication changes needed during rapid weight loss and life -style changes °•   If you have diabetes, follow up with your doctor that orders your diabetes medication(s) within one week after surgery and check your blood sugar regularly ° °• Do not drive while taking narcotics (pain medications) ° °• Do not take acetaminophen (Tylenol) and Roxicet or Lortab Elixir at the same time since these pain medications contain acetaminophen °  °Diet:  °First 2 Weeks You will see the nutritionist about two (2) weeks after your surgery. The nutritionist will increase the types of  foods you can eat if you are handling liquids well: °• If you have severe vomiting or nausea and cannot handle clear liquids lasting longer than 1 day call your surgeon °Protein Shake °• Drink at least 2 ounces of shake 5-6 times per day °• Each serving of protein shakes (usually 8-12 ounces) should have a minimum of: °o 15 grams of protein °o And no more than 5 grams of carbohydrate °• Goal for protein each day: °o Men = 80 grams per day °o Women = 60 grams per day °  ° • Protein powder may be added to fluids such as non-fat milk or Lactaid milk or Soy milk (limit to 35 grams added protein powder per serving) ° °Hydration °• Slowly increase the amount of water and other clear liquids as tolerated (See Acceptable Fluids) °• Slowly increase the amount of protein shake as tolerated °• Sip fluids slowly and throughout the day °• May use sugar substitutes in small amounts (no more than 6-8 packets per day; i.e. Splenda) ° °Fluid Goal °• The first goal is to drink at least 8 ounces of protein shake/drink per day (or as directed by the nutritionist); some examples of protein shakes are Syntrax Nectar, Adkins Advantage, EAS Edge HP, and Unjury. - See handout from pre-op Bariatric Education Class: °o Slowly increase the amount of protein shake you drink as tolerated °o You may find it easier to slowly sip shakes throughout the day °o It is important to get your proteins in first °• Your fluid goal is to drink 64-100 ounces of fluid daily °o It may take a few weeks to build up to this  °• 32 oz. (or more) should be clear liquids °And °• 32 oz. (or more) should be full liquids (see below for examples) °• Liquids should not contain sugar, caffeine, or carbonation ° °Clear Liquids: °• Water of Sugar-free flavored water (i.e. Fruit H²O, Propel) °• Decaffeinated coffee or tea (sugar-free) °• Crystal lite, Wyler’s Lite, Minute Maid Lite °• Sugar-free Jell-O °• Bouillon or broth °• Sugar-free Popsicle:    - Less than 20 calories  each; Limit 1 per day ° °Full Liquids: °                  Protein Shakes/Drinks + 2 choices per day of other full liquids °• Full liquids must be: °o No More Than 12 grams of Carbs per serving °o No More Than 3 grams of Fat per serving °• Strained low-fat cream soup °• Non-Fat milk °• Fat-free Lactaid Milk °• Sugar-free yogurt (Dannon Lite & Fit, Greek yogurt) ° °  °Vitamins and Minerals • Start 1 day after surgery unless otherwise directed by your surgeon °• 2 Chewable Multivitamin / Multimineral Supplement with iron (i.e. Centrum for Adults) °• Vitamin B-12, 350-500 micrograms sub-lingual (place tablet under the tongue) each day °• Chewable Calcium Citrate with Vitamin D-3 °(Example: 3 Chewable Calcium  Plus 600 with Vitamin D-3) °o Take 500 mg three (3) times a day for   a total of 1500 mg each day °o Do not take all 3 doses of calcium at one time as it may cause constipation, and you can only absorb 500 mg at a time °o Do not mix multivitamins containing iron with calcium supplements;  take 2 hours apart °o Do not substitute Tums (calcium carbonate) for your calcium °• Menstruating women and those at risk for anemia ( a blood disease that causes weakness) may need extra iron °o Talk to your doctor to see if you need more iron °• If you need extra iron: Total daily Iron recommendation (including Vitamins) is 50 to 100 mg Iron/day °• Do not stop taking or change any vitamins or minerals until you talk to your nutritionist or surgeon °• Your nutritionist and/or surgeon must approve all vitamin and mineral supplements °  °Activity and Exercise: It is important to continue walking at home. Limit your physical activity as instructed by your doctor. During this time, use these guidelines: °• Do not lift anything greater than ten  (10) pounds for at least two (2) weeks °• Do not go back to work or drive until your surgeon says you can °• You may have sex when you feel comfortable °o It is VERY important for male  patients to use a reliable birth control method; fertility often increase after surgery °o Do not get pregnant for at least 18 months °• Start exercising as soon as your doctor tells you that you can °o Make sure your doctor approves any physical activity °• Start with a simple walking program °• Walk 5-15 minutes each day, 7 days per week °• Slowly increase until you are walking 30-45 minutes per day °• Consider joining our BELT program. [phone] or email [email or web address] °  °Special Instructions Things to remember: °• Free counseling is available for you and your family through collaboration between [employer] and [employer]. Please call [phone] and leave a message °• Use your CPAP when sleeping if this applies to you °• Consider buying a medical alert bracelet that says you had lap-band surgery °  °  You will likely have your first fill (fluid added to your band) 6 - 8 weeks after surgery °• [facility] has a free Bariatric Surgery Support Group that meets monthly, the 3rd Thursday, 6pm. [facility] Classrooms. You can see classes online at [email or web address] °• It is very important to keep all follow up appointments with your surgeon, nutritionist, primary care physician, and behavioral health practitioner °o After the first year, please follow up with your bariatric surgeon and nutritionist at least once a year in order to maintain best weight loss results °      °             [facility]:  [phone] ° °             [facility]: [phone] ° °             Bariatric Nurse Coordinator: [phone]  °Gastric Bypass/Sleeve Home Care Instructions  Rev. 11/2012    ° °                                                    Reviewed and Endorsed °                                                     by [facility] Patient Education Committee, Jan, 2014 ° ° ° ° ° ° ° ° °How and Where to Give Subcutaneous Enoxaparin  Injections °Enoxaparin is an injectable medicine. It is used to help prevent blood clots from developing in your veins. Health care providers often use anticoagulants like enoxaparin to prevent clots following surgery. Enoxaparin is also used in combination with other medicines to treat blood clots and heart attacks. If blood clots are left untreated, they can be life threatening.  °Enoxaparin comes in single-use syringes. You inject enoxaparin through a syringe into your belly (abdomen). You should change the injection site each time you give yourself a shot. Continue the enoxaparin injections as directed by your health care provider. Your health care provider will use blood clotting test results to decide when you can safely stop using enoxaparin injections. If your health care provider prescribes any additional medicines, use the medicines exactly as directed. °HOW DO I INJECT ENOXAPARIN?  °1. Wash your hands with soap and water. °2. Clean the selected injection site as directed by your health care provider. °3. Remove the needle cap by pulling it straight off the syringe. °4. Hold the syringe like a pencil using your writing hand. °5. Use your other hand to pinch and hold an inch of the cleansed skin. °6. Insert the entire needle straight down into the fold of skin. °7. Push the plunger with your thumb until the syringe is empty. °8. Pull the needle straight out of your skin. °9. Enoxaparin injection prefilled syringes and graduated prefilled syringes are available with a system that shields the needle after injection. After you have completed your injection and removed the needle from your skin, firmly push down on the plunger. The protective sleeve will automatically cover the needle and you will hear a click. The click means the needle is safely covered. °10. Place the syringe in the nearest needle box, also called a sharps container. If you do not have a sharps container, you can use a hard-sided plastic  container with a secure lid, such as an empty laundry detergent bottle. °WHAT ELSE DO I NEED TO KNOW? °· Do not use enoxaparin if: °¨ You have allergies to heparin or pork products. °¨ You have been diagnosed with a condition called thrombocytopenia. °· Do not use the syringe or needle more than one time. °· Use medicines only as directed by your health care provider. °· Changes in medicines, supplements, diet, and illness can affect your anticoagulation therapy. Be sure to inform your health care provider of any of these changes. °· It is important that you tell all of your health care providers and your dentist that you are taking an anticoagulant, especially if you are injured or plan to have any type of procedure. °· While on anticoagulants, you will need to have blood tests done routinely as directed by your health care provider. °· While using this medicine, avoid physical activities or sports that could result in a fall or cause injury. °· Follow up with your laboratory test and health care provider appointments as directed. It is very important to keep your appointments. Not keeping appointments could result in a chronic or permanent injury, pain, or disability. °· Before giving your medicine, you should make sure the injection is a clear and colorless or pale yellow solution. If your medicine becomes discolored or if there are particles in the syringe, do not use it and notify your health care provider. °· Keep your medicine safely stored at room   temperature. °SEEK MEDICAL CARE IF: °· You develop any rashes on your skin. °· You have large areas of bruising on your skin. °· You have any worsening of the condition for which you take Enoxaparin. °· You develop a fever. °SEEK IMMEDIATE MEDICAL CARE IF: °· You develop bleeding problems such as: °¨ Bleeding from the gums or nose that does not stop quickly. °¨ Vomiting blood or coughing up blood. °¨ Blood in your urine. °¨ Blood in your stool, or stool that has a  dark, tarry, or coffee grounds appearance. °¨ A cut that does not stop bleeding within 10 minutes. °These symptoms may represent a serious problem that is an emergency. Do not wait to see if the symptoms will go away. Get medical help right away. Call your local emergency services (911 in the [place] Do not drive yourself to the hospital.  °  °This information is not intended to replace advice given to you by your health care provider. Make sure you discuss any questions you have with your health care provider. °  °Document Released: 07/31/2004 Document Revised: 10/20/2014 Document Reviewed: 03/16/2014 °Elsevier Interactive Patient Education ©2016 [employer] ° °

## 2015-08-13 ENCOUNTER — Telehealth (HOSPITAL_COMMUNITY): Payer: Self-pay

## 2015-08-13 NOTE — Telephone Encounter (Signed)
Made discharge phone call to patient per DROP protocol. Asking the following questions.    Do you have someone to care for you now that you are home?  yes Are you having pain now that is not relieved by your pain medication?  no Are you able to drink the recommended daily amount of fluids (48 ounces minimum/day) and protein (60-80 grams/day) as prescribed by the dietitian or nutritional counselor?  Yes  Are you taking the vitamins and minerals as prescribed?  yes Do you have the on call number to contact your surgeon if you have a problem or question? yes  Are your incisions free of redness, swelling or drainage? (If steri strips, address that these can fall off, shower as tolerated) yes Have your bowels moved since your surgery?  If not, are you passing gas?  yes Are you up and walking 3-4 times per day?  yes Do you have an appointment made to see your surgeon in the next month?  Yes

## 2015-08-21 ENCOUNTER — Encounter: Payer: BC Managed Care – PPO | Attending: General Surgery

## 2015-08-21 VITALS — Ht 71.0 in | Wt 347.0 lb

## 2015-08-21 DIAGNOSIS — Z713 Dietary counseling and surveillance: Secondary | ICD-10-CM | POA: Insufficient documentation

## 2015-08-21 DIAGNOSIS — Z6841 Body Mass Index (BMI) 40.0 and over, adult: Secondary | ICD-10-CM | POA: Insufficient documentation

## 2015-08-22 NOTE — Progress Notes (Signed)
Bariatric Class:  Appt start time: 1530 end time:  1630.  2 Week Post-Operative Nutrition Class  Patient was seen on 08/22/2015 for [facility]   Surgery date: 08/06/2015 Surgery type: RYGB Start weight at [facility]: 371 lbs on 07/05/15 Weight today: 347.0 lbs  Weight change: 27.6 lbs  TANITA  BODY COMP RESULTS  07/09/15 08/22/15   BMI (kg/m^2) N/A 48.4   Fat Mass (lbs)  176.0   Fat Free Mass (lbs)  171.0   Total Body Water (lbs)  125.0     The following the learning objectives were met by the patient during this course:  Identifies Phase 3A (Soft, High Proteins) Dietary Goals and will begin from 2 weeks post-operatively to 2 months post-operatively  Identifies appropriate sources of fluids and proteins   States protein recommendations and appropriate sources post-operatively  Identifies the need for appropriate texture modifications, mastication, and bite sizes when consuming solids  Identifies appropriate multivitamin and calcium sources post-operatively  Describes the need for physical activity post-operatively and will follow MD recommendations  States when to call healthcare provider regarding medication questions or post-operative complications  Handouts given during class include:  Phase 3A: Soft, High Protein Diet Handout  Follow-Up Plan: Patient will follow-up at [facility] in 6 weeks for 2 month post-op nutrition visit for diet advancement per MD.

## 2015-09-27 ENCOUNTER — Encounter: Payer: Self-pay | Admitting: Dietician

## 2015-09-27 ENCOUNTER — Encounter: Payer: BC Managed Care – PPO | Attending: General Surgery | Admitting: Dietician

## 2015-09-27 VITALS — Ht 71.5 in | Wt 326.5 lb

## 2015-09-27 DIAGNOSIS — Z713 Dietary counseling and surveillance: Secondary | ICD-10-CM | POA: Diagnosis not present

## 2015-09-27 DIAGNOSIS — Z6841 Body Mass Index (BMI) 40.0 and over, adult: Secondary | ICD-10-CM | POA: Insufficient documentation

## 2015-09-27 NOTE — Patient Instructions (Signed)
Goals:  Follow Phase 3B: High Protein + Non-Starchy Vegetables  Eat 3-6 small meals/snacks, every 3-5 hrs  Increase lean protein foods to meet 80g goal  Increase fluid intake to 64oz +  Avoid drinking 15 minutes before, during and 30 minutes after eating  Aim for >30 min of physical activity daily   TANITA  BODY COMP RESULTS  07/09/15 08/22/15 09/27/15   BMI (kg/m^2) N/A 48.4 44.9   Fat Mass (lbs)  176.0 161.5   Fat Free Mass (lbs)  171.0 165   Total Body Water (lbs)  125.0 121

## 2015-09-27 NOTE — Progress Notes (Signed)
  Follow-up visit:  8 Weeks Post-Operative RYGB Surgery  Medical Nutrition Therapy:  Appt start time: 955 end time:  1030.  Primary concerns today: Post-operative Bariatric Surgery Nutrition Management. Edward [name] returns having lost another 21 pounds. He reports he is able to tolerate 2-3 oz of meat. Having some bread. Using [employer] app, having [id number] calories per day. Has 2 protein shakes per day. Cannot tolerate goat cheese, causes pain. Also having a hard time with chicken. Has tried some steamed vegetables and tolerated them. He is looking forward to trying raw vegetables.   Surgery date: 08/06/2015 Surgery type: RYGB Start weight at [facility]: 371 lbs on 07/05/15 (highest weight 416 lbs per patient) Weight today: 326.5 lbs Weight change: 21 lbs Total weight lost: 44.5 (89.5) lbs  TANITA  BODY COMP RESULTS  07/09/15 08/22/15 09/27/15   BMI (kg/m^2) N/A 48.4 44.9   Fat Mass (lbs)  176.0 161.5   Fat Free Mass (lbs)  171.0 165   Total Body Water (lbs)  125.0 121    Preferred Learning Style:  No preference indicated   Learning Readiness:   Ready  24-hr recall: Snk (3AM): Premier protein shake (30g) B (AM): 4 oz steak (28g)  Snk (PM): Oikos triple zero yogurt, 1 serving cashews or almonds (15g) L (PM): beans or 3 oz salmon (14-21g) Snk (PM): Premier protein shake (30g) D (PM): Lean beef or fish (21g) Snk (PM):   Fluid intake: 64 oz Estimated total protein intake: 130-140g/day  Medications: taking fiber supplement, reduced Novolog Supplementation: yes  Average CBG per patient: 135-150 mg/dL Last patient reported X4G: 9% (from 14%)  Using straws: no Drinking while eating: no Hair loss: unknown  Carbonated beverages: none N/V/D/C: none Dumping syndrome: none  Recent physical activity:  Stretching and swimming in the mornings; plans to start water aerobics  Progress Towards Goal(s):  In progress.  Handouts given during visit include:  Phase 3B lean protein + non  starchy vegetables   Nutritional Diagnosis:  [place]-3.3 Overweight/obesity related to past poor dietary habits and physical inactivity as evidenced by patient w/ recent RYGB surgery following dietary guidelines for continued weight loss.     Intervention:  Nutrition counseling provided.  Teaching Method Utilized:  Visual Auditory  Barriers to learning/adherence to lifestyle change: none  Demonstrated degree of understanding via:  Teach Back   Monitoring/Evaluation:  Dietary intake, exercise, and body weight. Follow up in 6 weeks for 3.5 month post-op visit.

## 2015-11-09 ENCOUNTER — Ambulatory Visit: Payer: BC Managed Care – PPO | Admitting: Dietician

## 2015-12-13 DIAGNOSIS — Z79899 Other long term (current) drug therapy: Secondary | ICD-10-CM | POA: Insufficient documentation

## 2016-02-22 ENCOUNTER — Ambulatory Visit (INDEPENDENT_AMBULATORY_CARE_PROVIDER_SITE_OTHER): Payer: 59 | Admitting: Family Medicine

## 2016-02-22 ENCOUNTER — Encounter: Payer: Self-pay | Admitting: Family Medicine

## 2016-02-22 VITALS — BP 120/74 | HR 78 | Temp 98.0°F | Ht 71.5 in | Wt 318.7 lb

## 2016-02-22 DIAGNOSIS — E1169 Type 2 diabetes mellitus with other specified complication: Secondary | ICD-10-CM

## 2016-02-22 DIAGNOSIS — M459 Ankylosing spondylitis of unspecified sites in spine: Secondary | ICD-10-CM

## 2016-02-22 DIAGNOSIS — E7800 Pure hypercholesterolemia, unspecified: Secondary | ICD-10-CM

## 2016-02-22 DIAGNOSIS — F418 Other specified anxiety disorders: Secondary | ICD-10-CM

## 2016-02-22 DIAGNOSIS — E669 Obesity, unspecified: Secondary | ICD-10-CM | POA: Diagnosis not present

## 2016-02-22 DIAGNOSIS — F32A Depression, unspecified: Secondary | ICD-10-CM

## 2016-02-22 DIAGNOSIS — Z6841 Body Mass Index (BMI) 40.0 and over, adult: Secondary | ICD-10-CM

## 2016-02-22 DIAGNOSIS — F419 Anxiety disorder, unspecified: Principal | ICD-10-CM

## 2016-02-22 DIAGNOSIS — Z9884 Bariatric surgery status: Secondary | ICD-10-CM

## 2016-02-22 DIAGNOSIS — F3162 Bipolar disorder, current episode mixed, moderate: Secondary | ICD-10-CM | POA: Insufficient documentation

## 2016-02-22 DIAGNOSIS — M170 Bilateral primary osteoarthritis of knee: Secondary | ICD-10-CM

## 2016-02-22 DIAGNOSIS — G43909 Migraine, unspecified, not intractable, without status migrainosus: Secondary | ICD-10-CM | POA: Diagnosis not present

## 2016-02-22 DIAGNOSIS — E119 Type 2 diabetes mellitus without complications: Secondary | ICD-10-CM | POA: Diagnosis not present

## 2016-02-22 DIAGNOSIS — I10 Essential (primary) hypertension: Secondary | ICD-10-CM

## 2016-02-22 DIAGNOSIS — F329 Major depressive disorder, single episode, unspecified: Secondary | ICD-10-CM

## 2016-02-22 LAB — LIPID PANEL
CHOL/HDL RATIO: 6.1 ratio — AB (ref <=5.0)
Cholesterol: 245 mg/dL — ABNORMAL HIGH (ref 125–200)
HDL: 40 mg/dL (ref >=40)
LDL Cholesterol: 170 mg/dL — ABNORMAL HIGH (ref <130)
Triglycerides: 177 mg/dL — ABNORMAL HIGH (ref <150)
VLDL: 35 mg/dL — ABNORMAL HIGH (ref <30)

## 2016-02-22 LAB — CBC WITH DIFFERENTIAL/PLATELET
Basophils Absolute: 89 cells/uL (ref 0–200)
Basophils Relative: 1 %
Eosinophils Absolute: 178 cells/uL (ref 15–500)
Eosinophils Relative: 2 %
HCT: 46.1 % (ref 38.5–50.0)
Hemoglobin: 15.5 g/dL (ref 13.2–17.1)
LYMPHS ABS: 3026 {cells}/uL (ref 850–3900)
LYMPHS PCT: 34 %
MCH: 28.7 pg (ref 27.0–33.0)
MCHC: 33.6 g/dL (ref 32.0–36.0)
MCV: 85.4 fL (ref 80.0–100.0)
MONOS PCT: 6 %
MPV: 9.9 fL (ref 7.5–12.5)
Monocytes Absolute: 534 cells/uL (ref 200–950)
NEUTROS ABS: 5073 {cells}/uL (ref 1500–7800)
Neutrophils Relative %: 57 %
PLATELETS: 342 10*3/uL (ref 140–400)
RBC: 5.4 MIL/uL (ref 4.20–5.80)
RDW: 14.8 % (ref 11.0–15.0)
WBC: 8.9 10*3/uL (ref 3.8–10.8)

## 2016-02-22 LAB — COMPLETE METABOLIC PANEL WITH GFR
ALBUMIN: 3.7 g/dL (ref 3.6–5.1)
ALK PHOS: 82 U/L (ref 40–115)
ALT: 10 U/L (ref 9–46)
AST: 19 U/L (ref 10–40)
BUN: 18 mg/dL (ref 7–25)
CO2: 25 mmol/L (ref 20–31)
Calcium: 9 mg/dL (ref 8.6–10.3)
Chloride: 102 mmol/L (ref 98–110)
Creat: 0.75 mg/dL (ref 0.60–1.35)
GFR, Est African American: >89 mL/min (ref >=60)
GLUCOSE: 143 mg/dL — AB (ref 65–99)
POTASSIUM: 4.4 mmol/L (ref 3.5–5.3)
Sodium: 139 mmol/L (ref 135–146)
Total Bilirubin: 0.4 mg/dL (ref 0.2–1.2)
Total Protein: 6.7 g/dL (ref 6.1–8.1)

## 2016-02-22 LAB — POCT GLYCOSYLATED HEMOGLOBIN (HGB A1C): Hemoglobin A1C: 7.3

## 2016-02-22 LAB — TSH: TSH: 1.69 mIU/L (ref 0.40–4.50)

## 2016-02-22 NOTE — Assessment & Plan Note (Signed)
Controlled on Coreg 12.5 BID and Amlodipine 5 mg daily.

## 2016-02-22 NOTE — Assessment & Plan Note (Signed)
Currently on Zetia and Pitavastatin. Previous intolerance to Crestor (joint/muslce pains). -check Lipid profile today

## 2016-02-22 NOTE — Assessment & Plan Note (Signed)
Patient has lost close to 100 lbs after gastric bypass in 07/2015 -encouraged continued healthy lifestyle

## 2016-02-22 NOTE — Assessment & Plan Note (Signed)
Unknown control. Check A1C today.  -adjust medications based on A1C

## 2016-02-22 NOTE — Assessment & Plan Note (Signed)
Controlled with prn Imitrex (takes 4-5 times per month).

## 2016-02-22 NOTE — Assessment & Plan Note (Signed)
Chronic pain related to AS. Requests referral to Rheumatology. -referral placed

## 2016-02-22 NOTE — Patient Instructions (Signed)
It was nice to see you today.  I have referred you to rheumatology. My office will call you to schedule an appointment.  Dr. Randolm Idol will call you with your lab results.  Please return in 3-4 weeks to follow up on anxiety.

## 2016-02-22 NOTE — Assessment & Plan Note (Signed)
Stable on Effexor and Klonopin. -will have patient meet with integrative care at next visit -get PHQ-9 and GAD-7 at next visit

## 2016-02-22 NOTE — Progress Notes (Signed)
   Subjective:    Patient ID: Edward [name], male    DOB: [birth year], 45 y.o.   MRN: [id number]  HPI 45 y/o male presents to establish care.   Reviewed New Patient Health History Updated PMH/PSH/Social Hx./Meds/Allergies in EPIC as appropriate.   Previous PCP: Dr. Leonette Most ([facility]) - seen by PA last visit, Klonopin changed without his consent Gastric Bypass Surgeon: Dr. Gaynelle Adu  Diabetes: currently on Novolog 12 units daily (usually with lunch), highest blood glucose over the past month was 134  Ankylosing Spondylitis - diagnosed 1992, father was AS as well, does not follow with Rheumatology (would like to see physician in office). Controls pain with Zanaflex (3-4 times per day) and Tramadol (4 times per day), can not take NSAID's due to gastric bypass, interested in biologic treatment ([employer]).   Migraines - takes Imitrex prn, have increased in frequency after gastric bypass, having migraine 4-5 times per month, tolerable with Imitrex  Pain management ([facility]) was prescribing Tramadol/Zanaflex/Gabapentin  Anxiety/Depression - currently on Effexor and Klonopin, prescribed by previous PCP, has not seen psychology/psychiatry however is potentially interested in seeing a specialist in the future.  HLD - patient on Zetia and Livalo, previously intolerant to Crestor  HTN - on Coreg and Amlodipine, no chest pain, no headaches, no vision changes  Obesity - patient works on 5 day per week (elliptical, weights), s/p gastric bypass 08/2015  Social - married, 4 daughters, former smoker (04/16/12)   Review of Systems  Constitutional: Negative for fever, chills and fatigue.  Respiratory: Negative for shortness of breath.   Cardiovascular: Negative for chest pain.  Gastrointestinal: Negative for nausea and diarrhea.  Neurological: Positive for headaches.  Psychiatric/Behavioral: The patient is nervous/anxious.        Objective:   Physical Exam BP 120/74 mmHg  Pulse 78   Temp(Src) 98 F (36.7 C) (Oral)  Ht 5' 11.5" (1.816 m)  Wt 318 lb 11.2 oz (144.561 kg)  BMI 43.83 kg/m2  Gen: pleasant male, NAD, obese HEENT: normocephalic, PERRL, EOMI, no scleral icterus, nasal septum midline, no rhinorrhea, bilateral TM's pearly grey, MMM, uvula midline, neck supple, no cervical adenopath, no thyromegaly Cardiac: RRR, S1 and S2 present, no murmur, no heaves/thrills Resp: CTAB, normal effort Abd: obese, soft, no tenderness Skin: 4 incision sites on abdomen (from laproscopic gastric bypass) are healed well/slightly purple Ext: no edema      Assessment & Plan:  Migraines Controlled with prn Imitrex (takes 4-5 times per month).  HTN (hypertension) Controlled on Coreg 12.5 BID and Amlodipine 5 mg daily.   Diabetes mellitus type 2 in obese (HCC) Unknown control. Check A1C today.  -adjust medications based on A1C  Ankylosing spondylitis (HCC) Chronic pain related to AS. Requests referral to Rheumatology. -referral placed  Hypercholesteremia Currently on Zetia and Pitavastatin. Previous intolerance to Crestor (joint/muslce pains). -check Lipid profile today  Anxiety and depression Stable on Effexor and Klonopin. -will have patient meet with integrative care at next visit -get PHQ-9 and GAD-7 at next visit  Morbid obesity with BMI of 50.0-59.9, adult [facility]) Patient has lost close to 100 lbs after gastric bypass in 07/2015 -encouraged continued healthy lifestyle

## 2016-02-23 LAB — VITAMIN D 25 HYDROXY (VIT D DEFICIENCY, FRACTURES): Vit D, 25-Hydroxy: 21 ng/mL — ABNORMAL LOW (ref 30–100)

## 2016-02-26 ENCOUNTER — Telehealth: Payer: Self-pay | Admitting: Family Medicine

## 2016-02-26 DIAGNOSIS — G894 Chronic pain syndrome: Secondary | ICD-10-CM | POA: Insufficient documentation

## 2016-02-26 DIAGNOSIS — E7800 Pure hypercholesterolemia, unspecified: Secondary | ICD-10-CM

## 2016-02-26 DIAGNOSIS — E669 Obesity, unspecified: Secondary | ICD-10-CM

## 2016-02-26 DIAGNOSIS — E559 Vitamin D deficiency, unspecified: Secondary | ICD-10-CM

## 2016-02-26 DIAGNOSIS — E1169 Type 2 diabetes mellitus with other specified complication: Secondary | ICD-10-CM

## 2016-02-26 DIAGNOSIS — Z789 Other specified health status: Secondary | ICD-10-CM | POA: Insufficient documentation

## 2016-02-26 MED ORDER — PITAVASTATIN CALCIUM 2 MG PO TABS: 2.0000 | ORAL_TABLET | Freq: Every morning | ORAL | Status: DC

## 2016-02-26 MED ORDER — ASPIRIN EC 81 MG PO TBEC: 81.0000 mg | DELAYED_RELEASE_TABLET | Freq: Every day | ORAL | Status: DC

## 2016-02-26 MED ORDER — VITAMIN D3 125 MCG (5000 UT) PO TABS
1.0000 | ORAL_TABLET | ORAL | Status: DC
Start: 2016-02-26 — End: 2020-10-22

## 2016-02-26 MED ORDER — METFORMIN HCL 500 MG PO TABS: 500.0000 mg | ORAL_TABLET | Freq: Two times a day (BID) | ORAL | Status: DC

## 2016-02-26 NOTE — Telephone Encounter (Signed)
Discussed TSH/CMP/CBC/Vit D/Lipids with patient. See problem specific plan.   Vitamin D deficiency 25 Hydroxy level 21. Start Vit D supplementation.   Hypercholesteremia ASCVD 6.8%. [place] 11% - increased Pitavastatin to 4 mg daily.   Diabetes mellitus type 2 in obese (HCC) A1C 7.3 -stop daily Novolog -start Metformin 500 mg BID -recheck A1C in 3 months.

## 2016-02-26 NOTE — Assessment & Plan Note (Signed)
25 Hydroxy level 21. Start Vit D supplementation.

## 2016-02-26 NOTE — Assessment & Plan Note (Signed)
A1C 7.3 -stop daily Novolog -start Metformin 500 mg BID -recheck A1C in 3 months.

## 2016-02-26 NOTE — Assessment & Plan Note (Signed)
ASCVD 6.8%. [place]ham 11% - increased Pitavastatin to 4 mg daily.

## 2016-02-29 MED FILL — LIVALO 2 MG TABLET: 2 | 30 days supply | Qty: 60 | Fill #0

## 2016-02-29 MED FILL — metFORMIN HCL 500 MG TABS: 500 | 30 days supply | Qty: 60 | Fill #0

## 2016-04-10 ENCOUNTER — Ambulatory Visit (INDEPENDENT_AMBULATORY_CARE_PROVIDER_SITE_OTHER): Payer: 59 | Admitting: Family Medicine

## 2016-04-10 ENCOUNTER — Encounter: Payer: Self-pay | Admitting: Family Medicine

## 2016-04-10 VITALS — BP 117/75 | HR 77 | Temp 97.6°F | Ht 72.0 in | Wt 313.0 lb

## 2016-04-10 DIAGNOSIS — F429 Obsessive-compulsive disorder, unspecified: Secondary | ICD-10-CM

## 2016-04-10 DIAGNOSIS — Z7189 Other specified counseling: Secondary | ICD-10-CM

## 2016-04-10 DIAGNOSIS — F418 Other specified anxiety disorders: Secondary | ICD-10-CM | POA: Diagnosis not present

## 2016-04-10 DIAGNOSIS — E1169 Type 2 diabetes mellitus with other specified complication: Secondary | ICD-10-CM

## 2016-04-10 DIAGNOSIS — E119 Type 2 diabetes mellitus without complications: Secondary | ICD-10-CM | POA: Diagnosis not present

## 2016-04-10 DIAGNOSIS — E669 Obesity, unspecified: Secondary | ICD-10-CM

## 2016-04-10 DIAGNOSIS — F419 Anxiety disorder, unspecified: Secondary | ICD-10-CM

## 2016-04-10 DIAGNOSIS — F329 Major depressive disorder, single episode, unspecified: Secondary | ICD-10-CM

## 2016-04-10 DIAGNOSIS — M459 Ankylosing spondylitis of unspecified sites in spine: Secondary | ICD-10-CM

## 2016-04-10 DIAGNOSIS — G8929 Other chronic pain: Secondary | ICD-10-CM | POA: Insufficient documentation

## 2016-04-10 MED ORDER — GABAPENTIN 300 MG PO CAPS: 600.0000 mg | ORAL_CAPSULE | Freq: Three times a day (TID) | ORAL | Status: DC

## 2016-04-10 MED ORDER — VENLAFAXINE HCL ER 75 MG PO CP24: 75.0000 mg | ORAL_CAPSULE | Freq: Three times a day (TID) | ORAL | Status: DC

## 2016-04-10 MED ORDER — CLONAZEPAM 1 MG PO TABS: 1.0000 mg | ORAL_TABLET | Freq: Two times a day (BID) | ORAL | Status: DC

## 2016-04-10 MED ORDER — TRAMADOL HCL 50 MG PO TABS: 50.0000 mg | ORAL_TABLET | Freq: Four times a day (QID) | ORAL | Status: DC | PRN

## 2016-04-10 MED ORDER — TIZANIDINE HCL 4 MG PO CAPS: 4.0000 mg | ORAL_CAPSULE | Freq: Three times a day (TID) | ORAL | Status: DC

## 2016-04-10 MED ORDER — VENLAFAXINE HCL 75 MG PO TABS: 75.0000 mg | ORAL_TABLET | Freq: Three times a day (TID) | ORAL | Status: DC

## 2016-04-10 NOTE — Assessment & Plan Note (Signed)
Chronic pain due to Ankylosis Spondylitis. Waiting for referral to Rheumatology for DMARD -provided one month supply of Tramadol, Gabapentin, and Zanaflex

## 2016-04-10 NOTE — Progress Notes (Signed)
   Subjective:    Patient ID: Edward [name], male    DOB: [birth year], 45 y.o.   MRN: [id number]  HPI 45 y/o male presents for routine followup.  Ankylosing Spondylitis. - needs refill of Tramadol (takes 4 per day), Zanaflex (takes 3 per day), and Gabapentin (600 mg TID), has not been able to get into Rheum (has not received call from office to schedule).  Diabetes - taking Metformin 500 mg BID, no foot issues, has not been into see eye doctor for some time. Exercising multiple times per week (swimming, weight trainging)  Anxiety/Depression/OCD - reports ODC behavior, reports frusrtation if not able to feel in control, will been self up over negative thoughts/feelings; currently on Venlafaxine 75 mg TID and Klonopin 1 mg BID (previously prescribed by Dr. Leonette Most PCP); reports depression tied into pain from.   Social - nonsmoker  Review of Systems  Constitutional: Negative for fever, chills and fatigue.  Respiratory: Negative for cough and shortness of breath.   Cardiovascular: Negative for chest pain.  Gastrointestinal: Negative for nausea, vomiting and diarrhea.       Objective:   Physical Exam BP 117/75 mmHg  Pulse 77  Temp(Src) 97.6 F (36.4 C) (Oral)  Ht 6' (1.829 m)  Wt 313 lb (141.976 kg)  BMI 42.44 kg/m2 Gen: pleasant male,NAD HEENT: normocephalic, PERRL, EOMI, no scleral icterus Cardiac: RRR, S1 and S2 present, no murmur Resp: CTAB, normal effort Psych: anxious, well dressed, slightly pressured speech, no HI, no SI Foot Exam: no deformities, 2+ DP pulses,  Monofilament testing intact  GAD 7 - 17 PHQ 9 - 10     Assessment & Plan:  Ankylosing spondylitis (HCC) Patient continues to have chronic pain of low back related to Ankylosing Spondylitis. Referral to Rheumatology pending. -provided one month supply of Tramadol and Zanaflex  Diabetes mellitus type 2 in obese (HCC) Under improved control. Last A1C 7.3.  -continue metformin 500 mg BID -referral to  Ophthalmology for eye exam -foot exam completed today  Encounter for chronic pain management Chronic pain due to Ankylosis Spondylitis. Waiting for referral to Rheumatology for DMARD -provided one month supply of Tramadol, Gabapentin, and Zanaflex  Anxiety and depression Stable on Venlafaxine and Klonopin (GAD 7 score of 17, PHQ 9 score of 10) -one month supply of both provided -referral made to behavioral health as patient also had OCD tendencies

## 2016-04-10 NOTE — Patient Instructions (Signed)
It was nice to see you today.  Diabetes - continue Metformin 500 mg twice daily, you have been referred to an eye doctor, my office will contact you to schedule  Anxiety/Depression - you have referred to behavioral health, my office will contact you to schedule, you have been given refills of Klonopin and Effexor  Chronic Pain - you have given refills of Tramadol and Zanaflex, Dr. Randolm Idol will look into the rheumatology referral  Please return in one month.

## 2016-04-10 NOTE — Assessment & Plan Note (Signed)
Under improved control. Last A1C 7.3.  -continue metformin 500 mg BID -referral to Ophthalmology for eye exam -foot exam completed today

## 2016-04-10 NOTE — Assessment & Plan Note (Signed)
Stable on Venlafaxine and Klonopin (GAD 7 score of 17, PHQ 9 score of 10) -one month supply of both provided -referral made to behavioral health as patient also had OCD tendencies

## 2016-04-10 NOTE — Assessment & Plan Note (Signed)
Patient continues to have chronic pain of low back related to Ankylosing Spondylitis. Referral to Rheumatology pending. -provided one month supply of Tramadol and Zanaflex

## 2016-04-11 LAB — MICROALBUMIN, URINE: MICROALB UR: 109.6 mg/dL

## 2016-04-13 LAB — PAIN MGMT, PROFILE 5 W/CONF, U
ALPHAHYDROXYALPRAZOLAM: NEGATIVE ng/mL (ref <25)
AMPHETAMINES: NEGATIVE ng/mL (ref <500)
Alphahydroxymidazolam: NEGATIVE ng/mL (ref <50)
Alphahydroxytriazolam: NEGATIVE ng/mL (ref <50)
Aminoclonazepam: 74 ng/mL — ABNORMAL HIGH (ref <25)
BENZODIAZEPINES: POSITIVE ng/mL — AB (ref <100)
Barbiturates: NEGATIVE ng/mL (ref <300)
COCAINE METABOLITE: NEGATIVE ng/mL (ref <150)
CREATININE: 103 mg/dL (ref >=20.0)
Codeine: NEGATIVE ng/mL (ref <50)
HYDROCODONE: 478 ng/mL — AB (ref <50)
HYDROXYETHYLFLURAZEPAM: NEGATIVE ng/mL (ref <50)
Hydromorphone: 531 ng/mL — ABNORMAL HIGH (ref <50)
LORAZEPAM: NEGATIVE ng/mL (ref <50)
MARIJUANA METABOLITE: NEGATIVE ng/mL (ref <20)
Methadone Metabolite: NEGATIVE ng/mL (ref <100)
Morphine: NEGATIVE ng/mL (ref <50)
Nordiazepam: NEGATIVE ng/mL (ref <50)
Norhydrocodone: 590 ng/mL — ABNORMAL HIGH (ref <50)
OXIDANT: NEGATIVE ug/mL (ref <200)
Opiates: POSITIVE ng/mL — AB (ref <100)
Oxazepam: NEGATIVE ng/mL (ref <50)
Oxycodone: NEGATIVE ng/mL (ref <100)
PH: 5.9 (ref 4.5–9.0)
Temazepam: NEGATIVE ng/mL (ref <50)

## 2016-04-14 ENCOUNTER — Telehealth: Payer: Self-pay | Admitting: Family Medicine

## 2016-04-14 DIAGNOSIS — G8929 Other chronic pain: Secondary | ICD-10-CM

## 2016-04-14 DIAGNOSIS — R809 Proteinuria, unspecified: Secondary | ICD-10-CM

## 2016-04-14 MED ORDER — LISINOPRIL 5 MG PO TABS: 5.0000 mg | ORAL_TABLET | Freq: Every day | ORAL | Status: DC

## 2016-04-14 NOTE — Telephone Encounter (Signed)
Called and discussed lab results.   Microalbumin present in urine - will start Lisinopril 5 mg daily  UDS - Hydrocodone present (no prescribed by our office), patient admits to taking dose after flare up of back from cutting the lawn, previous prescription after wisdom teeth removal, informed patient that we will continue to check periodic UDS

## 2016-04-14 NOTE — Assessment & Plan Note (Signed)
Microalbuminuria present. Start Lisinopril 5 mg daily. -recheck BMP on 7/21 when patient returns for next visit.

## 2016-04-17 DIAGNOSIS — M179 Osteoarthritis of knee, unspecified: Secondary | ICD-10-CM | POA: Diagnosis not present

## 2016-04-17 DIAGNOSIS — M459 Ankylosing spondylitis of unspecified sites in spine: Secondary | ICD-10-CM | POA: Diagnosis not present

## 2016-04-17 DIAGNOSIS — M25569 Pain in unspecified knee: Secondary | ICD-10-CM | POA: Diagnosis not present

## 2016-04-17 DIAGNOSIS — R5383 Other fatigue: Secondary | ICD-10-CM | POA: Diagnosis not present

## 2016-04-17 DIAGNOSIS — M452 Ankylosing spondylitis of cervical region: Secondary | ICD-10-CM | POA: Diagnosis not present

## 2016-04-17 DIAGNOSIS — M549 Dorsalgia, unspecified: Secondary | ICD-10-CM | POA: Diagnosis not present

## 2016-04-17 DIAGNOSIS — M5137 Other intervertebral disc degeneration, lumbosacral region: Secondary | ICD-10-CM | POA: Diagnosis not present

## 2016-04-28 DIAGNOSIS — M459 Ankylosing spondylitis of unspecified sites in spine: Secondary | ICD-10-CM | POA: Diagnosis not present

## 2016-04-28 DIAGNOSIS — M25569 Pain in unspecified knee: Secondary | ICD-10-CM | POA: Diagnosis not present

## 2016-05-02 ENCOUNTER — Encounter: Payer: Self-pay | Admitting: Family Medicine

## 2016-05-02 ENCOUNTER — Ambulatory Visit (INDEPENDENT_AMBULATORY_CARE_PROVIDER_SITE_OTHER): Payer: 59 | Admitting: Family Medicine

## 2016-05-02 VITALS — BP 122/80 | HR 78 | Temp 97.9°F | Wt 322.4 lb

## 2016-05-02 DIAGNOSIS — F418 Other specified anxiety disorders: Secondary | ICD-10-CM | POA: Diagnosis not present

## 2016-05-02 DIAGNOSIS — G8929 Other chronic pain: Secondary | ICD-10-CM

## 2016-05-02 DIAGNOSIS — Z7189 Other specified counseling: Secondary | ICD-10-CM

## 2016-05-02 DIAGNOSIS — Z0000 Encounter for general adult medical examination without abnormal findings: Secondary | ICD-10-CM

## 2016-05-02 DIAGNOSIS — F329 Major depressive disorder, single episode, unspecified: Secondary | ICD-10-CM

## 2016-05-02 DIAGNOSIS — F419 Anxiety disorder, unspecified: Secondary | ICD-10-CM

## 2016-05-02 DIAGNOSIS — Z23 Encounter for immunization: Secondary | ICD-10-CM | POA: Diagnosis not present

## 2016-05-02 DIAGNOSIS — I10 Essential (primary) hypertension: Secondary | ICD-10-CM

## 2016-05-02 DIAGNOSIS — M459 Ankylosing spondylitis of unspecified sites in spine: Secondary | ICD-10-CM

## 2016-05-02 LAB — BASIC METABOLIC PANEL WITH GFR
BUN: 21 mg/dL (ref 7–25)
CALCIUM: 9.1 mg/dL (ref 8.6–10.3)
CO2: 31 mmol/L (ref 20–31)
Chloride: 101 mmol/L (ref 98–110)
Creat: 0.88 mg/dL (ref 0.60–1.35)
GLUCOSE: 122 mg/dL — AB (ref 65–99)
POTASSIUM: 4.8 mmol/L (ref 3.5–5.3)
SODIUM: 141 mmol/L (ref 135–146)

## 2016-05-02 MED ORDER — TRAMADOL HCL 50 MG PO TABS: 50.0000 mg | ORAL_TABLET | Freq: Four times a day (QID) | ORAL | Status: DC | PRN

## 2016-05-02 MED ORDER — TIZANIDINE HCL 4 MG PO CAPS: 4.0000 mg | ORAL_CAPSULE | Freq: Three times a day (TID) | ORAL | Status: DC

## 2016-05-02 MED ORDER — CLONAZEPAM 1 MG PO TABS: 1.0000 mg | ORAL_TABLET | Freq: Two times a day (BID) | ORAL | Status: DC

## 2016-05-02 NOTE — Progress Notes (Signed)
kf

## 2016-05-02 NOTE — Assessment & Plan Note (Signed)
Controlled. -check BMP as recently started on Lisinopril for microalbuminuria

## 2016-05-02 NOTE — Patient Instructions (Addendum)
It was nice to see you today.  You were given refill of Klonopin, Zanaflex, and Tramadol.  Dr. Randolm Idol will call you with your lab results.

## 2016-05-02 NOTE — Progress Notes (Signed)
   Subjective:    Patient ID: Edward [name], male    DOB: 1971/09/21, 45 y.o.   MRN: [id number]  HPI 45 y/o male presents for routine follow up.  Ankylosing Spondylitis - recently seen by Rheumatology,  awaiting initiation of DMARD (planning to start Humira soon), taking Tramadol and Flexeril daily, needs refills, pain is controlled (4-6/10 in the AM and improves later in the day).   Anxiety and Depression - patient here for follow up of anxiety depression, refill of Venlafaxine and Klonopin provided last visit, patient referred to behavioral health at last visit however did not hear from our office/psychology office. Some improvement, symptoms mostly related to Ankylosing Spondylitis and daughter with Autism.   HM - plans to schedule eye exam in near future, willing to have HIV and Tdap today.    Review of Systems  Constitutional: Negative for fever, chills and fatigue.  Respiratory: Negative for cough and shortness of breath.   Gastrointestinal: Negative for nausea, vomiting and diarrhea.       Objective:   Physical Exam BP 122/80 mmHg  Pulse 78  Temp(Src) 97.9 F (36.6 C) (Oral)  Wt 322 lb 6.4 oz (146.24 kg) Gen: pleasant male, NAD Cardiac: RRR, S1 and S2 present, no murmur Resp: CTAB, normal effort MSK: lumbar - mild lower midline tenderness, no paraspinal tenderness, ROM reduced (forward flexion/backward extension/side to side) Psych: affect is anxious, some pressured speech, mood is stated as ok, no HI or SI  GAD 7 score of 15 PHQ9 score of 9     Assessment & Plan:  HTN (hypertension) Controlled. -check BMP as recently started on Lisinopril for microalbuminuria  Encounter for chronic pain management Patient presents for pain management visit. Patient is functional and pain is controlled with Tramadol and Zanaflex. -three month supply of each provided  Ankylosing spondylitis (HCC) Pain is controlled on Tramadol and Flexeril. Planning to start Humira per  Rheumatology in next 1-2 weeks. -wean Tramadol and Flexeril as tolerated.   Anxiety and depression Mild improvement from last visit (GAD 7 score of 15 and PHQ 9 score of 9) -continue Venlafaxine and Klonopin -placed referral to behavioral health again (paitent did not hear from office after last visit).

## 2016-05-02 NOTE — Assessment & Plan Note (Signed)
Patient presents for pain management visit. Patient is functional and pain is controlled with Tramadol and Zanaflex. -three month supply of each provided

## 2016-05-02 NOTE — Assessment & Plan Note (Signed)
Patient given Tdap in office today.  Has upcoming eye appointment.  Check HIV today.

## 2016-05-02 NOTE — Assessment & Plan Note (Signed)
Pain is controlled on Tramadol and Flexeril. Planning to start Humira per Rheumatology in next 1-2 weeks. -wean Tramadol and Flexeril as tolerated.

## 2016-05-02 NOTE — Assessment & Plan Note (Signed)
Mild improvement from last visit (GAD 7 score of 15 and PHQ 9 score of 9) -continue Venlafaxine and Klonopin -placed referral to behavioral health again (paitent did not hear from office after last visit).

## 2016-05-03 LAB — HIV ANTIBODY (ROUTINE TESTING W REFLEX): HIV 1&2 Ab, 4th Generation: NONREACTIVE

## 2016-05-05 ENCOUNTER — Encounter: Payer: Self-pay | Admitting: Family Medicine

## 2016-05-15 DIAGNOSIS — M069 Rheumatoid arthritis, unspecified: Secondary | ICD-10-CM | POA: Diagnosis not present

## 2016-05-15 DIAGNOSIS — E1169 Type 2 diabetes mellitus with other specified complication: Secondary | ICD-10-CM | POA: Diagnosis not present

## 2016-05-15 DIAGNOSIS — Z9884 Bariatric surgery status: Secondary | ICD-10-CM | POA: Diagnosis not present

## 2016-05-15 DIAGNOSIS — E7800 Pure hypercholesterolemia, unspecified: Secondary | ICD-10-CM | POA: Diagnosis not present

## 2016-05-15 DIAGNOSIS — E559 Vitamin D deficiency, unspecified: Secondary | ICD-10-CM | POA: Diagnosis not present

## 2016-05-15 DIAGNOSIS — K912 Postsurgical malabsorption, not elsewhere classified: Secondary | ICD-10-CM | POA: Diagnosis not present

## 2016-05-15 DIAGNOSIS — I10 Essential (primary) hypertension: Secondary | ICD-10-CM | POA: Diagnosis not present

## 2016-05-15 DIAGNOSIS — E669 Obesity, unspecified: Secondary | ICD-10-CM | POA: Diagnosis not present

## 2016-05-26 MED FILL — HUMIRA PEN 40 MG/0.8ML PNKT: 40 | 28 days supply | Qty: 2 | Fill #0

## 2016-05-28 ENCOUNTER — Telehealth: Payer: Self-pay | Admitting: Pharmacist

## 2016-05-28 NOTE — Telephone Encounter (Signed)
Called patient to schedule an appointment for the [place] [facility]. I was unable to reach the patient so I left a HIPAA-compliant message requesting that the patient return my call.   

## 2016-06-11 ENCOUNTER — Telehealth: Payer: Self-pay | Admitting: Pharmacist

## 2016-06-11 NOTE — Telephone Encounter (Signed)
Called patient to schedule an appointment for the [place] [facility]. I was unable to reach the patient so I left a HIPAA-compliant message requesting that the patient return my call.   

## 2016-06-18 ENCOUNTER — Encounter: Payer: Self-pay | Admitting: Dietician

## 2016-06-18 ENCOUNTER — Encounter: Payer: 59 | Attending: General Surgery | Admitting: Dietician

## 2016-06-18 DIAGNOSIS — Z6841 Body Mass Index (BMI) 40.0 and over, adult: Secondary | ICD-10-CM

## 2016-06-18 DIAGNOSIS — Z713 Dietary counseling and surveillance: Secondary | ICD-10-CM | POA: Insufficient documentation

## 2016-06-18 DIAGNOSIS — Z79899 Other long term (current) drug therapy: Secondary | ICD-10-CM | POA: Diagnosis not present

## 2016-06-18 DIAGNOSIS — M459 Ankylosing spondylitis of unspecified sites in spine: Secondary | ICD-10-CM | POA: Diagnosis not present

## 2016-06-18 DIAGNOSIS — M25569 Pain in unspecified knee: Secondary | ICD-10-CM | POA: Diagnosis not present

## 2016-06-18 NOTE — Progress Notes (Signed)
  Follow-up visit:  11 months Post-Operative RYGB Surgery  Medical Nutrition Therapy:  Appt start time: 205 end time:  325  Primary concerns today: Post-operative Bariatric Surgery Nutrition Management. Edward [name] returns having lost a total of 128 lbs. He has continued a diligent exercise routine that includes cardio and strength training. He lives with his wife and 4 daughters. He does all the cooking in the house. He notices that he has to work on impulse control. Feels like he uses exercise as a coping mechanism rather than food. Does not tolerate beef well. Has been keeping a food journal. Blood pressure and blood sugars have been good! Has noticed improvement in anxiety and depression. States that he is happy with how far he has come.  Non scale victories: Muscle tone, waist size from 52 in to 38 in, getting up and down off floor to play with kids, feeling strong, stamina/endurance/energy, taking stairs easily, playing sports and martial arts with kids  Samples provided and patient instructed on proper use: Bariatric Advantage multi chewy bite (strawberry watermelon - qty 2) Lot#: [id number] Exp: 01/2017  Celebrate Iron chew (pineapple - qty 2) Lot#: [id number]  Exp: 12/2017   Surgery date: 08/06/2015 Surgery type: RYGB Start weight at [facility]: 371 lbs on 07/05/15 (highest weight 438 lbs per patient) Weight today: 309.7 lbs Total weight lost: 128.3 lbs   Preferred Learning Style:  No preference indicated   Learning Readiness:   Ready  24-hr recall: Snk (3AM): 32 oz water, vitamins Workout at 7:30 am BCAA recovery supplement B (9:30-10AM): 4-6 oz chicken, [place], or fish (28g) Walks 3-4 miles Snk (11-11:30AM): 1/2-3/4 cup beans, sometimes 1-2 pieces whole grain toast OR protein shake L (2PM): 4-6 oz fish  Snk (PM): sometimes a few cheez-its or popcorn D (PM): Snk (PM):   Fluid intake: 64 oz Estimated total protein intake: 130-140g/day  Medications: taking fiber  supplement, reduced Novolog Supplementation: yes, taking gummy vitamins (unable to tolerate other vitamins), taking Calcium supplement  Average CBG per patient: 90-105 mg/dL Last patient reported Q2M: 7% (from 14.4%)  Using straws: no Drinking while eating: no Hair loss: unknown  Carbonated beverages: none N/V/D/C: none Dumping syndrome: none  Recent physical activity:  Walking 3-4 miles daily; at least 5 days a week of cardio and strength training  Progress Towards Goal(s):  In progress.  Handouts given during visit include:  none   Nutritional Diagnosis:  [place]-3.3 Overweight/obesity related to past poor dietary habits and physical inactivity as evidenced by patient w/ recent RYGB surgery following dietary guidelines for continued weight loss.     Intervention:  Nutrition counseling provided. Reviewed newest bariatric vitamin recommendations and provided samples.   Teaching Method Utilized:  Visual Auditory  Barriers to learning/adherence to lifestyle change: none  Demonstrated degree of understanding via:  Teach Back   Monitoring/Evaluation:  Dietary intake, exercise, and body weight. Follow up in 6 weeks for 3.5 month post-op visit.

## 2016-06-18 NOTE — Patient Instructions (Addendum)
-  Keep your good habits!! -Continue to preportion snack foods and enjoy slowly -Continue exercise routine -Focus on your non-scale victories and don't worry as much about the number on the scale   Surgery date: 08/06/2015 Surgery type: RYGB Start weight at [facility]: 371 lbs on 07/05/15 (highest weight 438 lbs per patient) Weight today: 309.7 lbs Total weight lost: 128.3 lbs

## 2016-06-23 ENCOUNTER — Encounter: Payer: Self-pay | Admitting: Dietician

## 2016-06-27 ENCOUNTER — Ambulatory Visit: Payer: Self-pay | Admitting: Pharmacist

## 2016-06-27 DIAGNOSIS — M459 Ankylosing spondylitis of unspecified sites in spine: Secondary | ICD-10-CM

## 2016-06-27 MED ORDER — ADALIMUMAB 40 MG/0.8ML ~~LOC~~ AJKT: 0.8000 mL | AUTO-INJECTOR | SUBCUTANEOUS | 6 refills | Status: DC

## 2016-06-27 MED FILL — HUMIRA PEN 40 MG/0.8ML PNKT: 40 | 28 days supply | Qty: 2 | Fill #0

## 2016-06-27 NOTE — Progress Notes (Signed)
   S: Patient presents today to the [facility].  Patient is currently taking Humira for ankylosing spondylitis. Patient is managed by Dr. Kathi Ludwig for this.   Adherence: denies any missed doses. He rotates injection sites and injects Humira into his abdomen.  Efficacy: back pain has slightly improved but he has only received 2 doses so hopefully more benefit will be seen after a few more doses.  Dosing:  Ankylosing spondylitis: SubQ: 40 mg every other week (may continue methotrexate, other nonbiologic DMARDS, corticosteroids, NSAIDs and/or analgesics)  Drug-drug interactions: none  Screening: TB test: completed per patient Hepatitis: completed per patient  Monitoring: S/sx of infection: denies CBC: WNL S/sx of hypersensitivity: denies S/sx of malignancy: denies S/sx of heart failure: denies  O:     Lab Results  Component Value Date   WBC 8.9 02/22/2016   HGB 15.5 02/22/2016   HCT 46.1 02/22/2016   MCV 85.4 02/22/2016   PLT 342 02/22/2016      Chemistry      Component Value Date/Time   NA 141 05/02/2016 0925   K 4.8 05/02/2016 0925   CL 101 05/02/2016 0925   CO2 31 05/02/2016 0925   BUN 21 05/02/2016 0925   CREATININE 0.88 05/02/2016 0925      Component Value Date/Time   CALCIUM 9.1 05/02/2016 0925   ALKPHOS 82 02/22/2016 1007   AST 19 02/22/2016 1007   ALT 10 02/22/2016 1007   BILITOT 0.4 02/22/2016 1007       A/P: 1. Medication review: Patient currently on Humira for ankylosing spondylitis. Reviewed the medication with the patient, including the following: Humira is a TNF blocking agent indicated for ankylosing spondylitis, Crohn's disease, Hidradenitis suppurativa, psoriatic arthritis, plaque psoriasis, ulcerative colitis, and uveitis. The most common adverse effects are infections, headache, and injection site reactions. There is the possibility of an increased risk of malignancy but it is not well understood  if this increased risk is due to there medication or the disease state. There are rare cases of pancytopenia and aplastic anemia. No recommendations for any changes at this time.    Juanita Craver, PharmD, BCPS, BCACP, CPP Clinical Pharmacist Practitioner  [facility] [phone]

## 2016-07-28 MED FILL — HUMIRA PEN 40 MG/0.8ML PNKT: 40 | 28 days supply | Qty: 2 | Fill #1

## 2016-08-01 ENCOUNTER — Other Ambulatory Visit: Payer: Self-pay | Admitting: *Deleted

## 2016-08-04 MED ORDER — CLONAZEPAM 1 MG PO TABS: 1.0000 mg | ORAL_TABLET | Freq: Two times a day (BID) | ORAL | 0 refills | Status: DC

## 2016-08-04 NOTE — Telephone Encounter (Signed)
Rx called into patient pharmacy. Patient informed and appointment scheduled with PCP for 11/3.

## 2016-08-04 NOTE — Telephone Encounter (Signed)
Nursing staff please call in Klonopin 1 mg BID prn anxiety, dispense #60, refill #0, please schedule appointment as patient will need to be seen before this medication can be filled again.

## 2016-08-14 NOTE — Progress Notes (Signed)
   Subjective:    Patient ID: Edward [name], male    DOB: 1971-01-11, 45 y.o.   MRN: [id number]  HPI 45 y/o male presents for follow up of Anxiety and Depression.  Anxiety/Depression Currently on Venlafaxine and Klonopin (BID). Referred to behavioral health at last visit (Has not scheduled).   Ankylosing Spondylosis Currently on Humira by Rheumatology. Taking Tramadol and Zanaflex (only needs 1 at night) prn pain. Has been on Humira for 3 months, less pain and tightness.   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   HM Due for flu vaccine, eye exam  Social - has daughter with autism, nonsmoker   Review of Systems See above    Objective:   Physical Exam BP (!) 147/92   Pulse 68   Temp 97.7 F (36.5 C) (Oral)   Wt (!) 308 lb (139.7 kg)   BMI 41.77 kg/m  Gen: pleasant male, NAD Cardiac: RRR, S1 and S2 present, no murmur Resp:CTAB, normal effort MSK: right knee - scar over medial aspect of knee, scar over inferior knee and tibia, mild swelling, no joint line tenderness, lachman/varus/valgus ligament testing was normal, McMurry was positive for pain/crepitus over medial aspect; left knee - mild swelling, no joint line tenderness, lachman/varus/valgus ligament testing was normal, McMurry was negative for pain/crepitus  PHQ 9 score of 5 (somewhat difficult) GAD 7 score of 11 (very difficult)  POC A1C 6.9     Assessment & Plan:  Diabetes mellitus type 2 in obese (HCC) Controlled. A1C 6.9 -continue Metformin -encouraged opthalmology appointment  Anxiety and depression Improved since starting treatment for Ankylosing Spondylitis. -continue  Klonopin and Venlafaxine  Ankylosing spondylitis (HCC) Improved on Humira -continue Flexeril and Tramadol (decreased # of pills per month for both medications)  Osteoarthritis of both knees Patient reports worsening right knee pain. Exam consistent with meniscal etiology. Has had previous arthroscopy. -hold on referral to orthopedics at this time as patient reports that this is not a good time for surgery. Told patient that I would be happy to refer him in the future. Continue Tramadol prn pain.   Healthcare maintenance Flu vaccine provided to patient.

## 2016-08-15 ENCOUNTER — Ambulatory Visit (INDEPENDENT_AMBULATORY_CARE_PROVIDER_SITE_OTHER): Payer: 59 | Admitting: Family Medicine

## 2016-08-15 ENCOUNTER — Telehealth: Payer: Self-pay | Admitting: Family Medicine

## 2016-08-15 ENCOUNTER — Encounter: Payer: Self-pay | Admitting: Family Medicine

## 2016-08-15 DIAGNOSIS — Z0000 Encounter for general adult medical examination without abnormal findings: Secondary | ICD-10-CM

## 2016-08-15 DIAGNOSIS — Z23 Encounter for immunization: Secondary | ICD-10-CM | POA: Diagnosis not present

## 2016-08-15 DIAGNOSIS — M457 Ankylosing spondylitis of lumbosacral region: Secondary | ICD-10-CM

## 2016-08-15 DIAGNOSIS — F418 Other specified anxiety disorders: Secondary | ICD-10-CM | POA: Diagnosis not present

## 2016-08-15 DIAGNOSIS — F32A Depression, unspecified: Secondary | ICD-10-CM

## 2016-08-15 DIAGNOSIS — F329 Major depressive disorder, single episode, unspecified: Secondary | ICD-10-CM

## 2016-08-15 DIAGNOSIS — Z302 Encounter for sterilization: Secondary | ICD-10-CM | POA: Diagnosis not present

## 2016-08-15 DIAGNOSIS — E669 Obesity, unspecified: Secondary | ICD-10-CM

## 2016-08-15 DIAGNOSIS — E1169 Type 2 diabetes mellitus with other specified complication: Secondary | ICD-10-CM | POA: Diagnosis not present

## 2016-08-15 DIAGNOSIS — E7800 Pure hypercholesterolemia, unspecified: Secondary | ICD-10-CM

## 2016-08-15 DIAGNOSIS — F419 Anxiety disorder, unspecified: Secondary | ICD-10-CM

## 2016-08-15 DIAGNOSIS — Z309 Encounter for contraceptive management, unspecified: Secondary | ICD-10-CM | POA: Insufficient documentation

## 2016-08-15 DIAGNOSIS — M170 Bilateral primary osteoarthritis of knee: Secondary | ICD-10-CM

## 2016-08-15 LAB — POCT GLYCOSYLATED HEMOGLOBIN (HGB A1C): HEMOGLOBIN A1C: 6.9

## 2016-08-15 MED ORDER — PITAVASTATIN CALCIUM 2 MG PO TABS: 2.0000 | ORAL_TABLET | Freq: Every morning | ORAL | 1 refills | Status: DC

## 2016-08-15 MED ORDER — TRAMADOL HCL 50 MG PO TABS: 50.0000 mg | ORAL_TABLET | Freq: Four times a day (QID) | ORAL | 2 refills | Status: DC | PRN

## 2016-08-15 MED ORDER — CLONAZEPAM 1 MG PO TABS: 1.0000 mg | ORAL_TABLET | Freq: Two times a day (BID) | ORAL | 0 refills | Status: DC

## 2016-08-15 MED ORDER — TIZANIDINE HCL 4 MG PO CAPS: 4.0000 mg | ORAL_CAPSULE | Freq: Three times a day (TID) | ORAL | 2 refills | Status: DC

## 2016-08-15 NOTE — Assessment & Plan Note (Signed)
Improved since starting treatment for Ankylosing Spondylitis. -continue Klonopin and Venlafaxine

## 2016-08-15 NOTE — Assessment & Plan Note (Signed)
Controlled. A1C 6.9 -continue Metformin -encouraged opthalmology appointment

## 2016-08-15 NOTE — Patient Instructions (Signed)
It was nice to see you today.  Dr. Randolm Idol will call you with the A1C Results.  Please let me know if you would like to see the orthopedic surgeon.  I have placed a referral to urology for you.   Flu shot given today.

## 2016-08-15 NOTE — Telephone Encounter (Signed)
Called and informed patient of A1C of 6.9 (improved from previous)

## 2016-08-15 NOTE — Assessment & Plan Note (Signed)
Patient reports worsening right knee pain. Exam consistent with meniscal etiology. Has had previous arthroscopy. -hold on referral to orthopedics at this time as patient reports that this is not a good time for surgery. Told patient that I would be happy to refer him in the future. Continue Tramadol prn pain.

## 2016-08-15 NOTE — Assessment & Plan Note (Signed)
Improved on Humira -continue Flexeril and Tramadol (decreased # of pills per month for both medications)

## 2016-08-15 NOTE — Assessment & Plan Note (Signed)
Flu vaccine provided to patient.

## 2016-08-18 ENCOUNTER — Telehealth: Payer: Self-pay | Admitting: *Deleted

## 2016-08-18 DIAGNOSIS — E7800 Pure hypercholesterolemia, unspecified: Secondary | ICD-10-CM

## 2016-08-18 NOTE — Telephone Encounter (Signed)
Livalo is a Tier 3 drug.  PA form will be done online, but there are questions that need to be answered by MD.  Placed questions in MDs box.  Will complete PA when received back. [place], Maryjo Rochester, CMA

## 2016-08-19 NOTE — Telephone Encounter (Signed)
Provided responses to prior authorization form. Gave to [employer]

## 2016-08-19 NOTE — Telephone Encounter (Signed)
PA completed at [email or web address]. PA pending; review process could take 24-72 hours to complete.  Clovis Pu, RN

## 2016-08-22 MED ORDER — PITAVASTATIN CALCIUM 4 MG PO TABS: 1.0000 | ORAL_TABLET | Freq: Every day | ORAL | 1 refills | Status: DC

## 2016-08-22 NOTE — Addendum Note (Signed)
Addended by: Uvaldo Rising on: 08/22/2016 03:59 PM   Modules accepted: Orders

## 2016-08-22 NOTE — Telephone Encounter (Signed)
[employer] will cover 4mg  tablet once daily. Refill for this sent to pharmacy.

## 2016-08-22 NOTE — Telephone Encounter (Signed)
PA was denied via [employer] for Livalo.  Reference number: [id number].  Clovis Pu, RN

## 2016-09-01 MED FILL — HUMIRA PEN 40 MG/0.8ML PNKT: 40 | 28 days supply | Qty: 2 | Fill #2

## 2016-09-03 ENCOUNTER — Other Ambulatory Visit: Payer: Self-pay | Admitting: *Deleted

## 2016-09-03 MED ORDER — GABAPENTIN 300 MG PO CAPS: 600.0000 mg | ORAL_CAPSULE | Freq: Three times a day (TID) | ORAL | 2 refills | Status: DC

## 2016-10-01 ENCOUNTER — Other Ambulatory Visit: Payer: Self-pay | Admitting: *Deleted

## 2016-10-01 MED ORDER — GABAPENTIN 300 MG PO CAPS: 600.0000 mg | ORAL_CAPSULE | Freq: Three times a day (TID) | ORAL | 0 refills | Status: DC

## 2016-10-01 MED ORDER — VENLAFAXINE HCL ER 75 MG PO CP24: 75.0000 mg | ORAL_CAPSULE | Freq: Three times a day (TID) | ORAL | 0 refills | Status: DC

## 2016-10-01 MED FILL — HUMIRA PEN 40 MG/0.8ML PNKT: 40 | 28 days supply | Qty: 2 | Fill #3

## 2016-10-01 NOTE — Addendum Note (Signed)
Addended byGwendolyn Grant, Newt Lukes on: 10/01/2016 02:21 PM   Modules accepted: Orders

## 2016-10-15 DIAGNOSIS — Z79899 Other long term (current) drug therapy: Secondary | ICD-10-CM | POA: Diagnosis not present

## 2016-10-15 DIAGNOSIS — M459 Ankylosing spondylitis of unspecified sites in spine: Secondary | ICD-10-CM | POA: Diagnosis not present

## 2016-10-15 DIAGNOSIS — M25569 Pain in unspecified knee: Secondary | ICD-10-CM | POA: Diagnosis not present

## 2016-10-17 DIAGNOSIS — Z3009 Encounter for other general counseling and advice on contraception: Secondary | ICD-10-CM | POA: Diagnosis not present

## 2016-11-03 MED FILL — HUMIRA PEN 40 MG/0.8ML PNKT: 40 | 28 days supply | Qty: 2 | Fill #4

## 2016-11-10 ENCOUNTER — Other Ambulatory Visit: Payer: Self-pay | Admitting: *Deleted

## 2016-11-10 MED ORDER — CLONAZEPAM 1 MG PO TABS: 1.0000 mg | ORAL_TABLET | Freq: Two times a day (BID) | ORAL | 2 refills | Status: DC

## 2016-11-10 NOTE — Telephone Encounter (Signed)
RN staff - please call in Klonopin 1 mg BID, dispense #60, two refills.

## 2016-11-10 NOTE — Telephone Encounter (Signed)
Rx called into patient pharmacy. 

## 2016-11-19 ENCOUNTER — Telehealth (HOSPITAL_COMMUNITY): Payer: Self-pay

## 2016-11-19 NOTE — Telephone Encounter (Signed)
2/7 Left message on voicemail with call back information.  [phone] 

## 2016-12-04 MED FILL — HUMIRA PEN 40 MG/0.8ML PNKT: 40 | 28 days supply | Qty: 2 | Fill #5

## 2016-12-05 DIAGNOSIS — Z302 Encounter for sterilization: Secondary | ICD-10-CM | POA: Diagnosis not present

## 2017-01-07 MED FILL — HUMIRA PEN 40 MG/0.8ML PNKT: 40 | 28 days supply | Qty: 2 | Fill #6

## 2017-02-03 ENCOUNTER — Other Ambulatory Visit: Payer: Self-pay | Admitting: *Deleted

## 2017-02-04 MED ORDER — VENLAFAXINE HCL ER 75 MG PO CP24: 75.0000 mg | ORAL_CAPSULE | Freq: Three times a day (TID) | ORAL | 2 refills | Status: DC

## 2017-03-12 MED FILL — LIVALO 4 MG TABLET: 4 | 90 days supply | Qty: 90 | Fill #0

## 2017-03-25 ENCOUNTER — Other Ambulatory Visit: Payer: Self-pay | Admitting: *Deleted

## 2017-03-30 MED ORDER — TRAMADOL HCL 50 MG PO TABS: 50.0000 mg | ORAL_TABLET | Freq: Four times a day (QID) | ORAL | 0 refills | Status: DC | PRN

## 2017-03-30 NOTE — Telephone Encounter (Signed)
RN staff - please call in Tramadol 50 mg Q6 hours prn pain, dispense #90, no refills. Please inform patient.

## 2017-03-31 NOTE — Telephone Encounter (Signed)
Rx called into patient pharmacy. 

## 2017-04-22 ENCOUNTER — Ambulatory Visit: Payer: 59 | Admitting: Family Medicine

## 2017-04-22 MED FILL — VENLAFAXINE HCL ER 75 MG CA: 75 | 30 days supply | Qty: 90 | Fill #0

## 2017-04-30 ENCOUNTER — Encounter: Payer: Self-pay | Admitting: Family Medicine

## 2017-04-30 ENCOUNTER — Ambulatory Visit (INDEPENDENT_AMBULATORY_CARE_PROVIDER_SITE_OTHER): Payer: 59 | Admitting: Family Medicine

## 2017-04-30 VITALS — BP 130/76 | HR 78 | Temp 98.0°F | Ht 72.0 in | Wt 319.2 lb

## 2017-04-30 DIAGNOSIS — E669 Obesity, unspecified: Secondary | ICD-10-CM

## 2017-04-30 DIAGNOSIS — E1169 Type 2 diabetes mellitus with other specified complication: Secondary | ICD-10-CM

## 2017-04-30 DIAGNOSIS — F3162 Bipolar disorder, current episode mixed, moderate: Secondary | ICD-10-CM

## 2017-04-30 DIAGNOSIS — M457 Ankylosing spondylitis of lumbosacral region: Secondary | ICD-10-CM

## 2017-04-30 DIAGNOSIS — Z23 Encounter for immunization: Secondary | ICD-10-CM

## 2017-04-30 LAB — POCT GLYCOSYLATED HEMOGLOBIN (HGB A1C): Hemoglobin A1C: 7.8

## 2017-04-30 MED ORDER — CLONAZEPAM 1 MG PO TABS: 1.0000 mg | ORAL_TABLET | Freq: Two times a day (BID) | ORAL | 2 refills | Status: DC

## 2017-04-30 MED ORDER — TRAMADOL HCL 50 MG PO TABS: 50.0000 mg | ORAL_TABLET | Freq: Four times a day (QID) | ORAL | 2 refills | Status: DC | PRN

## 2017-04-30 MED ORDER — QUETIAPINE FUMARATE 25 MG PO TABS: 25.0000 mg | ORAL_TABLET | Freq: Every day | ORAL | 12 refills | Status: DC

## 2017-04-30 MED ORDER — METFORMIN HCL 500 MG PO TABS: 500.0000 mg | ORAL_TABLET | Freq: Two times a day (BID) | ORAL | 12 refills | Status: DC

## 2017-04-30 MED FILL — clonazePAM 1 MG TABS: 1 | 30 days supply | Qty: 60 | Fill #0

## 2017-04-30 MED FILL — QUETIAPINE FUMARATE 25 MG T: 25 | 30 days supply | Qty: 30 | Fill #0

## 2017-04-30 MED FILL — traMADol HCL 50 MG TABS: 50 | 30 days supply | Qty: 120 | Fill #0

## 2017-04-30 MED FILL — metFORMIN HCL 500 MG TABS: 500 | 30 days supply | Qty: 60 | Fill #0

## 2017-04-30 NOTE — Assessment & Plan Note (Signed)
Add move stabilizer.  Goal is to eventually wean benzo

## 2017-04-30 NOTE — Assessment & Plan Note (Signed)
Bump tramadol while off humera.

## 2017-04-30 NOTE — Assessment & Plan Note (Signed)
Restart metformin Get back to diet and exercise.

## 2017-04-30 NOTE — Patient Instructions (Addendum)
Diabetes: Get an eye exam.  Have doc send me copy of the report. No more flops.  Crocks OK in house.  Shoes when out and about. Restart your metformin.  I sent in new prescription. Pain Increase tramadol only while not on humera Anxiety. Add mood stabilizer seroquel Continue clonazepam for now.  Long term, wean off. Continue welbutrin See me in three months.

## 2017-04-30 NOTE — Progress Notes (Signed)
   Subjective:    Patient ID: Edward [name], male    DOB: 1970-12-24, 46 y.o.   MRN: [id number]  HPI My first visit with a 46 yo with a more than average number of complaints.  I will focus on three issues: Diabetes.  Type 2, was on insulin.  Bariatric surg and 100+ lb weight loss.  Now off all meds.  Weight has plateaued.  A1C above 7. Pain: Has an Hx of anklosing spondylitis.  Was on humera and doing well with tramadol tid.  Has not had humera in 3 months due to insurance non coverage.  Working through those issues.  Asks to increase tramadol to 4 x per day until back on humera Depression/Anxiety.  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  Feels he needs more meds.  Summer is hard.  Four children with two high maint.  One has autism and the second has ADHD.  Wife if NICU nurse. Has major mood swings, sometimes hypomanic and sometimes depressed.  Anxiety is a regular companion.  Obesity.  Plateau since last year.  Knows its important.     Review of Systems     Objective:   Physical Exam  A bit of logorrhea - I can see the hypomania.  No delusions.   Lungs clear Cardiac RRR without m or g         Assessment & Plan:

## 2017-05-18 ENCOUNTER — Encounter: Payer: Self-pay | Admitting: Family Medicine

## 2017-05-18 DIAGNOSIS — E669 Obesity, unspecified: Principal | ICD-10-CM

## 2017-05-18 DIAGNOSIS — E1169 Type 2 diabetes mellitus with other specified complication: Secondary | ICD-10-CM

## 2017-05-19 MED ORDER — QUETIAPINE FUMARATE 50 MG PO TABS: 50.0000 mg | ORAL_TABLET | Freq: Every day | ORAL | 3 refills | Status: DC

## 2017-06-05 MED FILL — traMADol HCL 50 MG TABS: 50 | 30 days supply | Qty: 120 | Fill #1

## 2017-06-05 MED FILL — clonazePAM 1 MG TABS: 1 | 30 days supply | Qty: 60 | Fill #1

## 2017-06-05 MED FILL — metFORMIN HCL 500 MG TABS: 500 | 30 days supply | Qty: 60 | Fill #1

## 2017-06-05 MED FILL — VENLAFAXINE HCL ER 75 MG CA: 75 | 30 days supply | Qty: 90 | Fill #1

## 2017-06-18 MED FILL — tiZANidine HCL 4 MG TABS: 4 | 10 days supply | Qty: 30 | Fill #0

## 2017-07-10 MED FILL — QUETIAPINE FUMARATE 50 MG T: 50 | 30 days supply | Qty: 30 | Fill #0

## 2017-07-10 MED FILL — metFORMIN HCL 500 MG TABS: 500 | 30 days supply | Qty: 60 | Fill #2

## 2017-07-10 MED FILL — traMADol HCL 50 MG TABS: 50 | 30 days supply | Qty: 120 | Fill #2

## 2017-08-20 ENCOUNTER — Other Ambulatory Visit: Payer: Self-pay | Admitting: Family Medicine

## 2017-08-20 MED FILL — clonazePAM 1 MG TABS: 1 | 30 days supply | Qty: 60 | Fill #2

## 2017-08-20 MED FILL — traMADol HCL 50 MG TABS: 50 | 30 days supply | Qty: 120 | Fill #0

## 2017-08-20 MED FILL — QUETIAPINE FUMARATE 50 MG T: 50 | 30 days supply | Qty: 30 | Fill #1

## 2017-09-22 ENCOUNTER — Other Ambulatory Visit: Payer: Self-pay | Admitting: Family Medicine

## 2017-09-23 ENCOUNTER — Other Ambulatory Visit: Payer: Self-pay | Admitting: Family Medicine

## 2017-09-23 MED ORDER — TIZANIDINE HCL 4 MG PO CAPS: 4.0000 mg | ORAL_CAPSULE | Freq: Three times a day (TID) | ORAL | 0 refills | Status: DC

## 2017-09-23 MED FILL — traMADol HCL 50 MG TABS: 50 | 30 days supply | Qty: 120 | Fill #0

## 2017-09-23 MED FILL — tiZANidine HCL 4 MG TABS: 4 | 10 days supply | Qty: 30 | Fill #0

## 2017-09-23 MED FILL — clonazePAM 1 MG TABS: 1 | 30 days supply | Qty: 60 | Fill #0

## 2017-10-26 MED FILL — QUETIAPINE FUMARATE 50 MG T: 50 | 30 days supply | Qty: 30 | Fill #2

## 2017-11-10 ENCOUNTER — Encounter: Payer: Self-pay | Admitting: Family Medicine

## 2017-11-12 ENCOUNTER — Other Ambulatory Visit: Payer: Self-pay

## 2017-11-12 ENCOUNTER — Ambulatory Visit (INDEPENDENT_AMBULATORY_CARE_PROVIDER_SITE_OTHER): Payer: 59 | Admitting: Family Medicine

## 2017-11-12 ENCOUNTER — Encounter: Payer: Self-pay | Admitting: Family Medicine

## 2017-11-12 VITALS — BP 126/80 | HR 87 | Temp 97.8°F | Ht 72.0 in | Wt 339.8 lb

## 2017-11-12 DIAGNOSIS — M455 Ankylosing spondylitis of thoracolumbar region: Secondary | ICD-10-CM | POA: Diagnosis not present

## 2017-11-12 DIAGNOSIS — E1169 Type 2 diabetes mellitus with other specified complication: Secondary | ICD-10-CM | POA: Diagnosis not present

## 2017-11-12 DIAGNOSIS — E669 Obesity, unspecified: Secondary | ICD-10-CM | POA: Diagnosis not present

## 2017-11-12 DIAGNOSIS — F3162 Bipolar disorder, current episode mixed, moderate: Secondary | ICD-10-CM | POA: Diagnosis not present

## 2017-11-12 DIAGNOSIS — Z23 Encounter for immunization: Secondary | ICD-10-CM | POA: Diagnosis not present

## 2017-11-12 DIAGNOSIS — R809 Proteinuria, unspecified: Secondary | ICD-10-CM | POA: Diagnosis not present

## 2017-11-12 DIAGNOSIS — I10 Essential (primary) hypertension: Secondary | ICD-10-CM

## 2017-11-12 DIAGNOSIS — Z6841 Body Mass Index (BMI) 40.0 and over, adult: Secondary | ICD-10-CM | POA: Diagnosis not present

## 2017-11-12 LAB — POCT GLYCOSYLATED HEMOGLOBIN (HGB A1C): HEMOGLOBIN A1C: 9

## 2017-11-12 MED ORDER — AMLODIPINE BESYLATE 5 MG PO TABS: 5.0000 mg | ORAL_TABLET | Freq: Every morning | ORAL | 3 refills | Status: DC

## 2017-11-12 MED ORDER — TIZANIDINE HCL 4 MG PO CAPS: 4.0000 mg | ORAL_CAPSULE | Freq: Three times a day (TID) | ORAL | 5 refills | Status: DC

## 2017-11-12 MED ORDER — CARVEDILOL 12.5 MG PO TABS: 12.5000 mg | ORAL_TABLET | Freq: Two times a day (BID) | ORAL | 3 refills | Status: DC

## 2017-11-12 MED ORDER — CLONAZEPAM 1 MG PO TABS: 1.0000 mg | ORAL_TABLET | Freq: Two times a day (BID) | ORAL | 5 refills | Status: DC

## 2017-11-12 MED ORDER — QUETIAPINE FUMARATE 50 MG PO TABS: 50.0000 mg | ORAL_TABLET | Freq: Every day | ORAL | 3 refills | Status: DC

## 2017-11-12 MED ORDER — METFORMIN HCL 1000 MG PO TABS: 1000.0000 mg | ORAL_TABLET | Freq: Two times a day (BID) | ORAL | 3 refills | Status: DC

## 2017-11-12 MED ORDER — LISINOPRIL 5 MG PO TABS: 5.0000 mg | ORAL_TABLET | Freq: Every day | ORAL | 3 refills | Status: DC

## 2017-11-12 MED ORDER — TRAMADOL HCL 50 MG PO TABS: 50.0000 mg | ORAL_TABLET | Freq: Four times a day (QID) | ORAL | 5 refills | Status: DC | PRN

## 2017-11-12 MED ORDER — MELOXICAM 7.5 MG PO TABS: 7.5000 mg | ORAL_TABLET | Freq: Every day | ORAL | 3 refills | Status: DC

## 2017-11-12 MED FILL — AMLODIPINE BESYLATE 5 MG TA: 5 | 90 days supply | Qty: 90 | Fill #0

## 2017-11-12 MED FILL — tiZANidine HCL 4 MG TABS: 4 | 30 days supply | Qty: 45 | Fill #0

## 2017-11-12 MED FILL — clonazePAM 1 MG TABS: 1 | 30 days supply | Qty: 60 | Fill #0

## 2017-11-12 MED FILL — metFORMIN HCL 1000 MG TABS: 1000 | 90 days supply | Qty: 180 | Fill #0

## 2017-11-12 MED FILL — LISINOPRIL 5 MG TABLET: 5 | 90 days supply | Qty: 90 | Fill #0

## 2017-11-12 MED FILL — CARVEDILOL 12.5 MG TABLET: 12.5 | 90 days supply | Qty: 180 | Fill #0

## 2017-11-12 MED FILL — traMADol HCL 50 MG TABS: 50 | 30 days supply | Qty: 120 | Fill #0

## 2017-11-12 MED FILL — MELOXICAM 7.5 MG TABLET: 7.5 | 90 days supply | Qty: 90 | Fill #0

## 2017-11-12 NOTE — Patient Instructions (Signed)
See me in one month for a weight and back pain check. Get to an eye doc. Have them send me a copy of the report. Get to work hard on the weight and exercise.  I will do my part - you need to own your part.

## 2017-11-12 NOTE — Assessment & Plan Note (Signed)
Poor control.  Increase metformin.  Diet and exercise.  FU one month.

## 2017-11-12 NOTE — Assessment & Plan Note (Addendum)
Improved with seroquel.  Although unclear what role mental health playing in wt gain.  Refill meds

## 2017-11-12 NOTE — Progress Notes (Signed)
   Subjective:    Patient ID: Clevland Cork, male    DOB: 06-08-1971, 47 y.o.   MRN: [id number]  HPI 2nd visit, too busy like the first.  Came in because I quit refilling meds.  Also see my chart messages:  Issues: Ankylosing Spondylitis:  Not seeing [facility] and not eligible to go back (I believe due to $)  Off Humera.  Has chronic pain which he manages OK.  Now in a "flair" of his AS.  Wants back to rheum for DMARD (I agree) and Rx for flair. Bipolar - likes the seroquel.  Needs refill on all meds. DM - worsening control.  Note 20 lb wt gain. States much less exercise since off the [place]. Morbid obesity - ditto about not exercising.      Review of Systems     Objective:   Physical Exam 20 lb wt gain noted. Lungs clear Cardiac RRR without m or g        Assessment & Plan:

## 2017-11-12 NOTE — Assessment & Plan Note (Addendum)
Poorly controled.  Refill meds  Rheum referral.  Add Mobic during acute flair.

## 2017-11-12 NOTE — Assessment & Plan Note (Signed)
Well controled on current meds.  Refills.

## 2017-11-12 NOTE — Assessment & Plan Note (Signed)
Worse.  Long discussion about him owning his part of the problem.  Regardless of his pain, he needs to find a way to exercise more.

## 2017-12-04 MED FILL — QUETIAPINE FUMARATE 50 MG T: 50 | 90 days supply | Qty: 90 | Fill #0

## 2017-12-11 MED FILL — traMADol HCL 50 MG TABS: 50 | 30 days supply | Qty: 120 | Fill #1

## 2017-12-11 MED FILL — clonazePAM 1 MG TABS: 1 | 30 days supply | Qty: 60 | Fill #1

## 2017-12-11 MED FILL — tiZANidine HCL 4 MG TABS: 4 | 30 days supply | Qty: 45 | Fill #1

## 2018-01-21 MED FILL — traMADol HCL 50 MG TABS: 50 | 30 days supply | Qty: 120 | Fill #2

## 2018-01-21 MED FILL — tiZANidine HCL 4 MG TABS: 4 | 30 days supply | Qty: 45 | Fill #2

## 2018-01-21 MED FILL — clonazePAM 1 MG TABS: 1 | 30 days supply | Qty: 60 | Fill #2

## 2018-02-08 DIAGNOSIS — Z79899 Other long term (current) drug therapy: Secondary | ICD-10-CM | POA: Insufficient documentation

## 2018-02-08 DIAGNOSIS — M459 Ankylosing spondylitis of unspecified sites in spine: Secondary | ICD-10-CM | POA: Diagnosis not present

## 2018-02-09 ENCOUNTER — Encounter: Payer: Self-pay | Admitting: Family Medicine

## 2018-02-09 DIAGNOSIS — M455 Ankylosing spondylitis of thoracolumbar region: Secondary | ICD-10-CM

## 2018-02-09 MED ORDER — ADALIMUMAB 40 MG/0.8ML ~~LOC~~ PSKT: 40.0000 mg | PREFILLED_SYRINGE | SUBCUTANEOUS | Status: DC

## 2018-02-09 NOTE — Assessment & Plan Note (Signed)
Seen by [facility].  Rx humira

## 2018-02-25 MED FILL — tiZANidine HCL 4 MG TABS: 4 | 30 days supply | Qty: 45 | Fill #3

## 2018-02-25 MED FILL — clonazePAM 1 MG TABS: 1 | 30 days supply | Qty: 60 | Fill #3

## 2018-02-25 MED FILL — metFORMIN HCL 1000 MG TABS: 1000 | 90 days supply | Qty: 180 | Fill #1

## 2018-02-25 MED FILL — MELOXICAM 7.5 MG TABLET: 7.5 | 90 days supply | Qty: 90 | Fill #1

## 2018-02-25 MED FILL — CARVEDILOL 12.5 MG TABLET: 12.5 | 90 days supply | Qty: 180 | Fill #1

## 2018-02-25 MED FILL — traMADol HCL 50 MG TABS: 50 | 30 days supply | Qty: 120 | Fill #3

## 2018-03-04 ENCOUNTER — Other Ambulatory Visit: Payer: Self-pay | Admitting: Internal Medicine

## 2018-03-04 DIAGNOSIS — M455 Ankylosing spondylitis of thoracolumbar region: Secondary | ICD-10-CM

## 2018-03-04 MED ORDER — ADALIMUMAB 40 MG/0.8ML ~~LOC~~ PSKT: 40.0000 mg | PREFILLED_SYRINGE | SUBCUTANEOUS | 0 refills | Status: DC

## 2018-03-05 ENCOUNTER — Other Ambulatory Visit: Payer: Self-pay | Admitting: Internal Medicine

## 2018-03-05 DIAGNOSIS — M455 Ankylosing spondylitis of thoracolumbar region: Secondary | ICD-10-CM

## 2018-03-05 MED ORDER — ADALIMUMAB 40 MG/0.8ML ~~LOC~~ PSKT: 40.0000 mg | PREFILLED_SYRINGE | SUBCUTANEOUS | 2 refills | Status: DC

## 2018-03-09 ENCOUNTER — Other Ambulatory Visit: Payer: Self-pay | Admitting: Internal Medicine

## 2018-03-09 DIAGNOSIS — M455 Ankylosing spondylitis of thoracolumbar region: Secondary | ICD-10-CM

## 2018-03-09 MED ORDER — ADALIMUMAB 40 MG/0.8ML ~~LOC~~ AJKT: 0.8000 mL | AUTO-INJECTOR | SUBCUTANEOUS | 0 refills | Status: DC

## 2018-03-09 MED ORDER — ADALIMUMAB 40 MG/0.8ML ~~LOC~~ PSKT: 40.0000 mg | PREFILLED_SYRINGE | SUBCUTANEOUS | 0 refills | Status: DC

## 2018-03-10 MED FILL — HUMIRA 40 MG/0.8ML PSKT: 40 | 28 days supply | Qty: 2 | Fill #0

## 2018-04-05 MED FILL — QUETIAPINE FUMARATE 50 MG T: 50 | 90 days supply | Qty: 90 | Fill #1

## 2018-04-05 MED FILL — tiZANidine HCL 4 MG TABS: 4 | 30 days supply | Qty: 45 | Fill #4

## 2018-04-05 MED FILL — clonazePAM 1 MG TABS: 1 | 30 days supply | Qty: 60 | Fill #4

## 2018-04-05 MED FILL — traMADol HCL 50 MG TABS: 50 | 30 days supply | Qty: 120 | Fill #4

## 2018-04-08 MED FILL — HUMIRA 40 MG/0.8ML PSKT: 40 | 28 days supply | Qty: 2 | Fill #1

## 2018-05-06 MED FILL — traMADol HCL 50 MG TABS: 50 | 30 days supply | Qty: 120 | Fill #5

## 2018-05-06 MED FILL — HUMIRA 40 MG/0.8ML PSKT: 40 | 28 days supply | Qty: 2 | Fill #2

## 2018-06-04 ENCOUNTER — Other Ambulatory Visit: Payer: Self-pay | Admitting: Internal Medicine

## 2018-06-04 ENCOUNTER — Other Ambulatory Visit: Payer: Self-pay | Admitting: Family Medicine

## 2018-06-04 DIAGNOSIS — M455 Ankylosing spondylitis of thoracolumbar region: Secondary | ICD-10-CM

## 2018-06-04 DIAGNOSIS — F3162 Bipolar disorder, current episode mixed, moderate: Secondary | ICD-10-CM

## 2018-06-04 MED FILL — MELOXICAM 7.5 MG TABLET: 7.5 | 90 days supply | Qty: 90 | Fill #2

## 2018-06-04 MED FILL — clonazePAM 1 MG TABS: 1 | 60 days supply | Qty: 60 | Fill #0

## 2018-06-04 MED FILL — traMADol HCL 50 MG TABS: 50 | 30 days supply | Qty: 120 | Fill #0

## 2018-06-04 MED FILL — tiZANidine HCL 4 MG TABS: 4 | 30 days supply | Qty: 45 | Fill #5

## 2018-06-17 ENCOUNTER — Other Ambulatory Visit: Payer: Self-pay | Admitting: Pharmacist

## 2018-06-17 MED ORDER — ADALIMUMAB 40 MG/0.8ML ~~LOC~~ PSKT: 0.8000 mL | PREFILLED_SYRINGE | SUBCUTANEOUS | 2 refills | Status: DC

## 2018-06-17 MED FILL — HUMIRA 40 MG/0.8ML PSKT: 40 | 28 days supply | Qty: 2 | Fill #0

## 2018-07-19 MED FILL — traMADol HCL 50 MG TABS: 50 | 30 days supply | Qty: 120 | Fill #1

## 2018-07-19 MED FILL — HUMIRA 40 MG/0.8ML PSKT: 40 | 28 days supply | Qty: 2 | Fill #1

## 2018-08-16 ENCOUNTER — Other Ambulatory Visit: Payer: Self-pay | Admitting: Family Medicine

## 2018-08-16 DIAGNOSIS — M455 Ankylosing spondylitis of thoracolumbar region: Secondary | ICD-10-CM

## 2018-08-16 MED FILL — QUETIAPINE FUMARATE 50 MG T: 50 | 90 days supply | Qty: 90 | Fill #2

## 2018-08-16 MED FILL — clonazePAM 1 MG TABS: 1 | 60 days supply | Qty: 60 | Fill #1

## 2018-08-16 MED FILL — tiZANidine HCL 4 MG TABS: 4 | 30 days supply | Qty: 45 | Fill #0

## 2018-08-23 ENCOUNTER — Other Ambulatory Visit: Payer: Self-pay | Admitting: Family Medicine

## 2018-08-23 DIAGNOSIS — M455 Ankylosing spondylitis of thoracolumbar region: Secondary | ICD-10-CM

## 2018-08-23 MED FILL — HUMIRA 40 MG/0.8ML PSKT: 40 | 28 days supply | Qty: 2 | Fill #2

## 2018-08-31 MED FILL — traMADol HCL 50 MG TABS: 50 | 30 days supply | Qty: 120 | Fill #0

## 2018-10-01 ENCOUNTER — Other Ambulatory Visit: Payer: Self-pay | Admitting: Family Medicine

## 2018-10-01 DIAGNOSIS — M455 Ankylosing spondylitis of thoracolumbar region: Secondary | ICD-10-CM

## 2018-10-01 MED FILL — MELOXICAM 7.5 MG TABLET: 7.5 | 90 days supply | Qty: 90 | Fill #3

## 2018-10-12 ENCOUNTER — Other Ambulatory Visit: Payer: Self-pay | Admitting: Pharmacist

## 2018-10-12 MED ORDER — ADALIMUMAB 40 MG/0.8ML ~~LOC~~ PSKT: 0.8000 mL | PREFILLED_SYRINGE | SUBCUTANEOUS | 0 refills | Status: DC

## 2018-10-12 MED FILL — HUMIRA 40 MG/0.8ML PSKT: 40 | 28 days supply | Qty: 2 | Fill #0

## 2018-10-12 MED FILL — traMADol HCL 50 MG TABS: 50 | 30 days supply | Qty: 120 | Fill #0

## 2018-11-23 ENCOUNTER — Other Ambulatory Visit: Payer: Self-pay | Admitting: Family Medicine

## 2018-11-23 DIAGNOSIS — M455 Ankylosing spondylitis of thoracolumbar region: Secondary | ICD-10-CM

## 2018-11-23 DIAGNOSIS — F3162 Bipolar disorder, current episode mixed, moderate: Secondary | ICD-10-CM

## 2018-11-23 MED FILL — traMADol HCL 50 MG TABS: 50 | 30 days supply | Qty: 120 | Fill #0

## 2018-11-23 MED FILL — clonazePAM 1 MG TABS: 1 | 30 days supply | Qty: 60 | Fill #0

## 2018-11-23 MED FILL — QUETIAPINE FUMARATE 50 MG T: 50 | 90 days supply | Qty: 90 | Fill #0

## 2018-11-23 MED FILL — tiZANidine HCL 4 MG TABS: 4 | 30 days supply | Qty: 45 | Fill #1

## 2018-12-22 ENCOUNTER — Other Ambulatory Visit: Payer: Self-pay | Admitting: Family Medicine

## 2018-12-22 DIAGNOSIS — M455 Ankylosing spondylitis of thoracolumbar region: Secondary | ICD-10-CM

## 2018-12-23 ENCOUNTER — Other Ambulatory Visit: Payer: Self-pay | Admitting: Family Medicine

## 2018-12-23 DIAGNOSIS — M455 Ankylosing spondylitis of thoracolumbar region: Secondary | ICD-10-CM

## 2018-12-23 MED FILL — MELOXICAM 7.5 MG TABLET: 7.5 | 30 days supply | Qty: 30 | Fill #0

## 2018-12-23 MED FILL — clonazePAM 1 MG TABS: 1 | 30 days supply | Qty: 60 | Fill #1

## 2018-12-23 MED FILL — tiZANidine HCL 4 MG TABS: 4 | 30 days supply | Qty: 45 | Fill #2 | Status: TO

## 2018-12-27 ENCOUNTER — Ambulatory Visit (INDEPENDENT_AMBULATORY_CARE_PROVIDER_SITE_OTHER): Payer: 59 | Admitting: Family Medicine

## 2018-12-27 ENCOUNTER — Other Ambulatory Visit: Payer: Self-pay

## 2018-12-27 ENCOUNTER — Encounter: Payer: Self-pay | Admitting: Family Medicine

## 2018-12-27 VITALS — BP 118/82 | HR 78 | Temp 98.0°F | Ht 72.0 in | Wt 322.4 lb

## 2018-12-27 DIAGNOSIS — F3162 Bipolar disorder, current episode mixed, moderate: Secondary | ICD-10-CM | POA: Diagnosis not present

## 2018-12-27 DIAGNOSIS — E669 Obesity, unspecified: Secondary | ICD-10-CM | POA: Diagnosis not present

## 2018-12-27 DIAGNOSIS — M455 Ankylosing spondylitis of thoracolumbar region: Secondary | ICD-10-CM | POA: Diagnosis not present

## 2018-12-27 DIAGNOSIS — E1169 Type 2 diabetes mellitus with other specified complication: Secondary | ICD-10-CM

## 2018-12-27 LAB — POCT GLYCOSYLATED HEMOGLOBIN (HGB A1C): HBA1C, POC (CONTROLLED DIABETIC RANGE): 12.8 % — AB (ref 0.0–7.0)

## 2018-12-27 MED ORDER — DULAGLUTIDE 0.75 MG/0.5ML ~~LOC~~ SOAJ: 0.7500 mg | SUBCUTANEOUS | 3 refills | Status: DC

## 2018-12-27 MED ORDER — METFORMIN HCL 1000 MG PO TABS: 1000.0000 mg | ORAL_TABLET | Freq: Two times a day (BID) | ORAL | 3 refills | Status: DC

## 2018-12-27 MED ORDER — MELOXICAM 15 MG PO TABS: 15.0000 mg | ORAL_TABLET | Freq: Every day | ORAL | 6 refills | Status: DC

## 2018-12-27 MED FILL — metFORMIN HCL 1000 MG TABS: 1000 | 90 days supply | Qty: 180 | Fill #0

## 2018-12-27 MED FILL — MELOXICAM 15 MG TABLET: 15 | 30 days supply | Qty: 30 | Fill #0 | Status: TO

## 2018-12-27 MED FILL — TRULICITY 0.75 MG/0.5 ML PE: 0.75 | 28 days supply | Qty: 2 | Fill #0 | Status: TO

## 2018-12-27 NOTE — Patient Instructions (Signed)
Remember: You have committed to a minimum of 2 sessions at the gym each week. I sent in a prescription of metformin and trulicity. See me in one month.  We will focus on the anxiety.   Bottom line: become a survivor

## 2018-12-27 NOTE — Progress Notes (Signed)
21 

## 2018-12-28 ENCOUNTER — Other Ambulatory Visit: Payer: Self-pay | Admitting: Family Medicine

## 2018-12-28 ENCOUNTER — Encounter: Payer: Self-pay | Admitting: Family Medicine

## 2018-12-28 DIAGNOSIS — M455 Ankylosing spondylitis of thoracolumbar region: Secondary | ICD-10-CM

## 2018-12-28 MED ORDER — TRAMADOL HCL 50 MG PO TABS: 50.0000 mg | ORAL_TABLET | Freq: Four times a day (QID) | ORAL | 5 refills | Status: DC | PRN

## 2018-12-28 MED FILL — traMADol HCL 50 MG TABS: 50 | 30 days supply | Qty: 120 | Fill #0 | Status: TO

## 2018-12-28 NOTE — Assessment & Plan Note (Signed)
No change in meds.  Exercise and wt loss

## 2018-12-28 NOTE — Assessment & Plan Note (Signed)
Start trulicity.  Continue metformen

## 2018-12-28 NOTE — Assessment & Plan Note (Signed)
No change in meds for now.  Encouraged counseling.  Encouraged exercise.

## 2018-12-28 NOTE — Assessment & Plan Note (Signed)
Motivational interviewing.  We focused on getting back to the gym regularly, where he felt best.

## 2018-12-28 NOTE — Progress Notes (Signed)
Established Patient Office Visit  Subjective:  Patient ID: Edward [name], male    DOB: 27-Feb-1971  Age: 48 y.o. MRN: [id number]  CC:  Chief Complaint  Patient presents with  . Back Pain    HPI Edward [name] presents for way to many problems than can be handled in a single visit.  1. Start with back pain.  Known ankylosing spondylitis Chronic pain. On humera.  Not going to gym.  Wt has decreased since last visit.  Still morbid obesity.  States pain meds are barely holding.   2.  Bipolar disorder.  Worse.  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  Meds are barely managing.  Not in counseling.  Not seeing a psychiatrist.  States that anxiety has kept him away from routine visits. 3. DM terrible control  A1C=12.  States that diet is reasonable.   4. Morbid  Despite other complaints of headache and wanting to know about switching from clonazepam to lorazepam, we stopped there.  Also behind on health maint.  Past Medical History:  Diagnosis Date  . Rheumatic fever age 37    Past Surgical History:  Procedure Laterality Date  . arthroscopic surgery Left years ago  . both knee cartlidge surgery    . GASTRIC ROUX-EN-Y N/A 08/06/2015   Procedure: LAPAROSCOPIC ROUX-EN-Y GASTRIC BYPASS WITH UPPER ENDOSCOPY;  Surgeon: Gaynelle Adu, MD;  Location: [facility];  Service: General;  Laterality: N/A;  . HIP SURGERY Left 1986   with knee surgery  . KNEE SURGERY Right 1986   benign tumor with bone graft   . UPPER GI ENDOSCOPY  08/06/2015   Procedure: UPPER GI ENDOSCOPY;  Surgeon: Gaynelle Adu, MD;  Location: [facility];  Service: General;;    Family History  Problem Relation Age of Onset  . Hyperlipidemia Mother   . Hypertension Mother   . Thyroid disease Father   . Early death Father 78       Cancer  . Diabetes Father   . Autoimmune disease Father        Ankylosing Spondylitis  . Diabetes Brother   . Hyperlipidemia Brother   . Hypertension Brother   .  Thyroid disease Brother   . Heart disease Maternal Grandmother   . Stroke Maternal Grandmother   . Heart disease Maternal Grandfather   . Autoimmune disease Paternal Grandfather     Social History   Socioeconomic History  . Marital status: Married    Spouse name: Not on file  . Number of children: Not on file  . Years of education: Not on file  . Highest education level: Not on file  Occupational History  . Not on file  Social Needs  . Financial resource strain: Not on file  . Food insecurity:    Worry: Not on file    Inability: Not on file  . Transportation needs:    Medical: Not on file    Non-medical: Not on file  Tobacco Use  . Smoking status: Former Smoker    Packs/day: 0.25    Years: 10.00    Pack years: 2.50    Types: Cigarettes    Last attempt to quit: 04/16/2012    Years since quitting: 6.7  . Smokeless tobacco: Never Used  Substance and Sexual Activity  . Alcohol use: No    Alcohol/week: 0.0 standard drinks  . Drug use: No  . Sexual activity: Yes    Partners: Female  Lifestyle  .  Physical activity:    Days per week: Not on file    Minutes per session: Not on file  . Stress: Not on file  Relationships  . Social connections:    Talks on phone: Not on file    Gets together: Not on file    Attends religious service: Not on file    Active member of club or organization: Not on file    Attends meetings of clubs or organizations: Not on file    Relationship status: Not on file  . Intimate partner violence:    Fear of current or ex partner: Not on file    Emotionally abused: Not on file    Physically abused: Not on file    Forced sexual activity: Not on file  Other Topics Concern  . 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 Adalimumab (HUMIRA) 40 MG/0.8ML PSKT Inject 0.8 mLs (40 mg total) into the skin every 14 (fourteen) days. (40 mg total) 2 each 0  . amLODipine (NORVASC) 5 MG tablet Take 1 tablet (5 mg total) by mouth every morning. 90 tablet 3  . aspirin EC 81 MG tablet Take 1 tablet (81 mg total) by mouth daily.    . carvedilol (COREG) 12.5 MG tablet Take 1 tablet (12.5 mg total) by mouth 2 (two) times daily with a meal. 180 tablet 3  . Cholecalciferol (VITAMIN D3) 5000 units TABS Take 1 tablet (5,000 Units total) by mouth once a week. 8 tablet 0  . clonazePAM (KLONOPIN) 1 MG tablet TAKE 1 TABLET (1 MG TOTAL) BY MOUTH 2 (TWO) TIMES DAILY. 60 tablet 1  . ezetimibe (ZETIA) 10 MG tablet Take 10 mg by mouth every morning.     Marland Kitchen lisinopril (PRINIVIL,ZESTRIL) 5 MG tablet Take 1 tablet (5 mg total) by mouth daily. 90 tablet 3  . Pitavastatin Calcium (LIVALO) 4 MG TABS Take 1 tablet (4 mg total) by mouth daily. 90 tablet 1  . QUEtiapine (SEROQUEL) 50 MG tablet TAKE 1 TABLET (50 MG TOTAL) BY MOUTH AT BEDTIME. 90 tablet 0  . SUMAtriptan (IMITREX) 50 MG tablet Take 50 mg by mouth every 2 (two) hours as needed for migraine. May repeat in 2 hours if headache persists or recurs.    Marland Kitchen tiZANidine (ZANAFLEX) 4 MG tablet TAKE 1 TABLET (4 MG TOTAL) BY MOUTH 3 (THREE) TIMES DAILY. (MUST LAST 1 MONTH PER MD) 45 tablet 5  . venlafaxine XR (EFFEXOR-XR) 75 MG 24 hr capsule Take 1 capsule (75 mg total) by mouth 3 (three) times daily. 90 capsule 2  . meloxicam (MOBIC) 7.5 MG tablet TAKE 1 TABLET (7.5 MG TOTAL) BY MOUTH DAILY. DURING FLAIRS 30 tablet 0  . metFORMIN (GLUCOPHAGE) 1000 MG tablet Take 1 tablet (1,000 mg total) by mouth 2 (two) times daily with a meal. 180 tablet 3  . traMADol (ULTRAM) 50 MG tablet TAKE 1 TABLET BY MOUTH EVERY 6 HOURS AS NEEDED 120 tablet 0   No facility-administered medications prior to visit.     Allergies  Allergen Reactions  . Demerol [Meperidine] Swelling     Swelling of hands, neck  . Nsaids     Other reaction(s): GI Upset (intolerance)  . Tapentadol  Other reaction(s): Other (See Comments) Skin crawling    ROS Review of Systems    Objective:    Physical Exam  BP 118/82   Pulse 78   Temp 98 F (36.7 C) (Oral)   Ht 6' (1.829 m)   Wt (!) 322 lb 6.4 oz (146.2 kg)   SpO2 96%   BMI 43.73 kg/m  Wt Readings from Last 3 Encounters:  12/27/18 (!) 322 lb 6.4 oz (146.2 kg)  11/12/17 (!) 339 lb 12.8 oz (154.1 kg)  04/30/17 (!) 319 lb 3.2 oz (144.8 kg)  Lungs clear Cardiac RRR without m or g Ext trace bilateral edema Psych, affect is of overwhelmed resignation.  Clearly has an external locus of control    Health Maintenance Due  Topic Date Due  . OPHTHALMOLOGY EXAM  12/27/1981  . FOOT EXAM  04/30/2018  . INFLUENZA VACCINE  05/13/2018    There are no preventive care reminders to display for this patient.  Lab Results  Component Value Date   TSH 1.69 02/22/2016   Lab Results  Component Value Date   WBC 8.9 02/22/2016   HGB 15.5 02/22/2016   HCT 46.1 02/22/2016   MCV 85.4 02/22/2016   PLT 342 02/22/2016   Lab Results  Component Value Date   NA 141 05/02/2016   K 4.8 05/02/2016   CO2 31 05/02/2016   GLUCOSE 122 (H) 05/02/2016   BUN 21 05/02/2016   CREATININE 0.88 05/02/2016   BILITOT 0.4 02/22/2016   ALKPHOS 82 02/22/2016   AST 19 02/22/2016   ALT 10 02/22/2016   PROT 6.7 02/22/2016   ALBUMIN 3.7 02/22/2016   CALCIUM 9.1 05/02/2016   ANIONGAP 9 08/07/2015   Lab Results  Component Value Date   CHOL 245 (H) 02/22/2016   Lab Results  Component Value Date   HDL 40 02/22/2016   Lab Results  Component Value Date   LDLCALC 170 (H) 02/22/2016   Lab Results  Component Value Date   TRIG 177 (H) 02/22/2016   Lab Results  Component Value Date   CHOLHDL 6.1 (H) 02/22/2016   Lab Results  Component Value Date   HGBA1C 12.8 (A) 12/27/2018      Assessment & Plan:   Problem List Items Addressed This  Visit    Diabetes mellitus type 2 in obese (HCC) - Primary   Relevant Medications   Dulaglutide (TRULICITY) 0.75 MG/0.5ML SOPN   metFORMIN (GLUCOPHAGE) 1000 MG tablet   Other Relevant Orders   POCT glycosylated hemoglobin (Hb A1C) (Completed)   Ankylosing spondylitis (HCC)   Relevant Medications   meloxicam (MOBIC) 15 MG tablet      Meds ordered this encounter  Medications  . Dulaglutide (TRULICITY) 0.75 MG/0.5ML SOPN    Sig: Inject 0.75 mg into the skin once a week.    Dispense:  8 pen    Refill:  3  . metFORMIN (GLUCOPHAGE) 1000 MG tablet    Sig: Take 1 tablet (1,000 mg total) by mouth 2 (two) times daily with a meal.    Dispense:  180 tablet    Refill:  3  . meloxicam (MOBIC) 15 MG tablet    Sig: Take 1 tablet (15 mg total) by mouth daily.    Dispense:  30 tablet    Refill:  6    Follow-up: No follow-ups on file.    Edward MannersWilliam A Jazsmine Macari, MD

## 2019-01-03 ENCOUNTER — Encounter: Payer: Self-pay | Admitting: Family Medicine

## 2019-01-04 ENCOUNTER — Telehealth: Payer: Self-pay | Admitting: Family Medicine

## 2019-01-04 MED ORDER — VENLAFAXINE HCL ER 75 MG PO CP24: 225.0000 mg | ORAL_CAPSULE | Freq: Every day | ORAL | 3 refills | Status: DC

## 2019-01-04 MED FILL — VENLAFAXINE HCL ER 75 MG CA: 75 | 30 days supply | Qty: 90 | Fill #0

## 2019-01-04 NOTE — Telephone Encounter (Signed)
Patient has not been taking his venlafaxine.  Restarted.  Discussed how to slowly titrate up the dose.

## 2019-01-17 ENCOUNTER — Encounter: Payer: Self-pay | Admitting: Pharmacist

## 2019-01-17 NOTE — Progress Notes (Signed)
Reviewed pharmacy records and patient has not filled Humira in multiple months. Will remove from [facility] patient list.

## 2019-02-15 MED FILL — tiZANidine HCL 4 MG TABS: 4 | 30 days supply | Qty: 45 | Fill #0

## 2019-02-15 MED FILL — VENLAFAXINE HCL ER 75 MG CA: 75 | 30 days supply | Qty: 90 | Fill #1

## 2019-02-15 MED FILL — MELOXICAM 15 MG TABLET: 15 | 30 days supply | Qty: 30 | Fill #0

## 2019-02-15 MED FILL — TRULICITY 0.75 MG/0.5 ML PE: 0.75 | 28 days supply | Qty: 2 | Fill #0

## 2019-02-23 ENCOUNTER — Other Ambulatory Visit: Payer: Self-pay | Admitting: Family Medicine

## 2019-02-23 DIAGNOSIS — F3162 Bipolar disorder, current episode mixed, moderate: Secondary | ICD-10-CM

## 2019-02-23 MED FILL — clonazePAM 1 MG TABS: 1 | 30 days supply | Qty: 60 | Fill #0

## 2019-02-23 MED FILL — traMADol HCL 50 MG TABS: 50 | 30 days supply | Qty: 120 | Fill #0

## 2019-03-08 ENCOUNTER — Other Ambulatory Visit: Payer: Self-pay | Admitting: Family Medicine

## 2019-03-08 DIAGNOSIS — F3162 Bipolar disorder, current episode mixed, moderate: Secondary | ICD-10-CM

## 2019-03-09 MED FILL — QUETIAPINE FUMARATE 50 MG T: 50 | 90 days supply | Qty: 90 | Fill #0

## 2019-03-31 MED FILL — VENLAFAXINE HCL ER 75 MG CA: 75 | 30 days supply | Qty: 90 | Fill #0

## 2019-03-31 MED FILL — TRULICITY 0.75 MG/0.5 ML PE: 0.75 | 28 days supply | Qty: 2 | Fill #0

## 2019-03-31 MED FILL — clonazePAM 1 MG TABS: 1 | 30 days supply | Qty: 60 | Fill #0

## 2019-03-31 MED FILL — traMADol HCL 50 MG TABS: 50 | 30 days supply | Qty: 120 | Fill #0

## 2019-03-31 MED FILL — tiZANidine HCL 4 MG TABS: 4 | 30 days supply | Qty: 45 | Fill #0

## 2019-05-06 MED FILL — traMADol HCL 50 MG TABS: 50 | 30 days supply | Qty: 120 | Fill #1

## 2019-05-06 MED FILL — TRULICITY 0.75 MG/0.5 ML PE: 0.75 | 28 days supply | Qty: 2 | Fill #1

## 2019-06-07 ENCOUNTER — Other Ambulatory Visit: Payer: Self-pay | Admitting: Family Medicine

## 2019-06-07 DIAGNOSIS — F3162 Bipolar disorder, current episode mixed, moderate: Secondary | ICD-10-CM

## 2019-06-07 MED FILL — clonazePAM 1 MG TABS: 1 | 30 days supply | Qty: 60 | Fill #0

## 2019-06-07 MED FILL — traMADol HCL 50 MG TABS: 50 | 30 days supply | Qty: 120 | Fill #2

## 2019-06-07 MED FILL — tiZANidine HCL 4 MG TABS: 4 | 30 days supply | Qty: 45 | Fill #1

## 2019-06-07 NOTE — Telephone Encounter (Signed)
Reviewed last note.  Not thriving with back pain, wt gain, increased anxiety, and poor DM control.  Given 30 day Rx.  Needs appointment before additional refills.

## 2019-06-10 ENCOUNTER — Other Ambulatory Visit: Payer: Self-pay

## 2019-06-10 DIAGNOSIS — F3162 Bipolar disorder, current episode mixed, moderate: Secondary | ICD-10-CM

## 2019-06-10 MED ORDER — QUETIAPINE FUMARATE 50 MG PO TABS: 50.0000 mg | ORAL_TABLET | Freq: Every day | ORAL | 0 refills | Status: DC

## 2019-06-10 MED FILL — QUETIAPINE FUMARATE 50 MG T: 50 | 90 days supply | Qty: 90 | Fill #0

## 2019-06-15 MED FILL — TRULICITY 0.75 MG/0.5 ML PE: 0.75 | 28 days supply | Qty: 2 | Fill #2

## 2019-07-12 MED FILL — TRULICITY 0.75 MG/0.5 ML PE: 0.75 | 28 days supply | Qty: 2 | Fill #3

## 2019-08-01 ENCOUNTER — Other Ambulatory Visit: Payer: Self-pay | Admitting: Family Medicine

## 2019-08-01 DIAGNOSIS — M455 Ankylosing spondylitis of thoracolumbar region: Secondary | ICD-10-CM

## 2019-08-01 DIAGNOSIS — F3162 Bipolar disorder, current episode mixed, moderate: Secondary | ICD-10-CM

## 2019-08-01 MED FILL — clonazePAM 1 MG TABS: 1 | 30 days supply | Qty: 60 | Fill #0

## 2019-08-01 MED FILL — MELOXICAM 15 MG TABLET: 15 | 30 days supply | Qty: 30 | Fill #0

## 2019-08-01 MED FILL — traMADol HCL 50 MG TABS: 50 | 30 days supply | Qty: 120 | Fill #0

## 2019-09-05 ENCOUNTER — Other Ambulatory Visit: Payer: Self-pay | Admitting: Family Medicine

## 2019-09-05 DIAGNOSIS — M455 Ankylosing spondylitis of thoracolumbar region: Secondary | ICD-10-CM

## 2019-09-22 ENCOUNTER — Telehealth: Payer: Self-pay | Admitting: Psychology

## 2019-09-22 ENCOUNTER — Encounter: Payer: Self-pay | Admitting: Family Medicine

## 2019-09-22 NOTE — Telephone Encounter (Signed)
Spoke with pt for about 30 minutes assessing his mental health.  Pt was requesting change to medication for depression.  Pt reported his wife recently separated from him and it has caused great distress.   Pt reported a great deal of insight about his emotions and concerns.  Pt endorsed suicidal thoughts. Pt reported that these thoughts were "fleeting".  Pt denied plans and denied intent.  Pt reported protective factors including his children, fear of death and his religion.   Pt scheduled visit with Dr. Maudie Mercury tomorrow virtually to go over medication.  Pt was given information for therapy resources. Pt was given suicide hotline number.  Pt consented to check-in call next week by Dr. Hartford Poli about f/u on therapy resources.

## 2019-09-22 NOTE — Telephone Encounter (Signed)
Thanks to all for the great help.  The patient and I needed this strong teamwork approach.

## 2019-09-23 ENCOUNTER — Other Ambulatory Visit: Payer: Self-pay

## 2019-09-23 ENCOUNTER — Encounter: Payer: Self-pay | Admitting: Family Medicine

## 2019-09-23 ENCOUNTER — Telehealth (INDEPENDENT_AMBULATORY_CARE_PROVIDER_SITE_OTHER): Payer: 59 | Admitting: Family Medicine

## 2019-09-23 DIAGNOSIS — F3162 Bipolar disorder, current episode mixed, moderate: Secondary | ICD-10-CM | POA: Diagnosis not present

## 2019-09-23 DIAGNOSIS — F332 Major depressive disorder, recurrent severe without psychotic features: Secondary | ICD-10-CM

## 2019-09-23 MED ORDER — CLONAZEPAM 2 MG PO TABS: 2.0000 mg | ORAL_TABLET | ORAL | 0 refills | Status: DC | PRN

## 2019-09-23 NOTE — Assessment & Plan Note (Addendum)
Spoke with patient at great length today about his recent feelings of depression and worthlessness.  He is currently going through many life changes and has many stressors at home.  Dr. Oren Binet recommended that he follow-up with a psychiatrist or psychologist as soon as possible.  He has called mood disorder clinic on [address] and is currently doing intake with them.  Patient is already tapered himself off of the Effexor.  He reports that 1 mg of Klonopin is not helpful and that too is more helpful, which he has been self titrating recently.  Will provide patient with 2 mg of Klonopin as needed.  Patient is not to take over 4 mg daily.  Patient discouraged from taking Klonopin scheduled as this does not maximize the effect of the medicine.  Patient will need to develop tools and behaviors to help him during the times that he feels very anxious and worthless.  Patient denies any SI/HI.  He has many protective factors including his children, wife, mom.  He also does not want them to suffer if he were to do something as selfish as suicide.  He also reports that he is scared to die.   Considered starting Cymbalta as patient has chronic pain and osteoarthritis.  However, patient is on several other psychiatric medications that can have interactions and increased risk for serotonin syndrome.  Should consider this in the future after addressing other medications that he has.  He has not had a psychiatrist that he has followed with and has been managed very sporadically for his mood disorders due to poor follow-up.  Patient should establish with mood disorder clinic as soon as possible and follow-up with [place] as necessary. Plan discussed with Dr. Andria Frames and Dr. Hartford Poli

## 2019-09-23 NOTE — Progress Notes (Signed)
[facility] Telemedicine Visit  Patient consented to have virtual visit. Method of visit: Video  Encounter participants: Patient: Edward [name] - located at home Provider: Melene Plan - located at [facility] Others (if applicable): n/a  Chief Complaint: f/u SI and depression  HPI: SI, Depression Patient returns via video visit to discuss further after phone call with Dr. Shawnee Knapp and Dr. Leveda Anna yesterday. Patient provides extensive history about his current situation. Denies SI, HI during phone call.  Patient quickly starts to explain what has been going on in his life.  He reports that his wife is asking for separation.  Patient has suffered from anxiety, bipolar, depression as well as ankylosing spondylosis which is caused him a lot of suffering due to the constant pain.  Patient reports that he has been more moody recently.  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  Patient reports he does not know why he did not go.  He has 2 daughters, [name] 47 years old and Delorise Shiner 47 years old.  [name] has severe autism and over the last 1 to 1-1/2 years has been more violent.  They have tried therapy and he often has to stand in the way to protect his wife and daughter.  Her violence include scratching, throwing things, breaking things.  There has been a lot of extra stress to try to get her into specialty.  He reports that 6 months ago, when he was not sure how to deal with her episodes, he is to raise her voice which he relies only made her escalate.  He reports that he has improved to the way that he deals with her.  He reports staying very mild mannered.  However patient does not think that these issues are directly related to the separation but certainly add to the stress.   Patient struggles with being alone despite being an introvert.  His mind is not at peace, he reports.  He reports very  negative thoughts, toxic, difficult.  He tries his best to fight these thoughts.  He denies any alcohol or drug use.  He does not feel that medication is really working.  He is currently taking Klonopin 1 mg twice daily.  Prior to this, he was on Xanax.  Despite doubling his Klonopin dosing, it barely touches the stress.  Patient also takes Seroquel 50 mg and for short time did help sleep some but not much with stabilization of depression during the day.  Patient was previously on Effexor and did not like the way it made him feel.  Tried it again and did not like it.  Patient is looking for the following in a drug. 1.  Once a good rescue drug when needed and 2.  Give him something to feel better.  Patient reports that he still has to go out to grocery shop and pick up the kids for example.  Covid has only added to the stress.  Patient reports that he used to work out prior to [employer] 2.  Currently, patient is pursuing new career and trying to stay positive.  He reports that he likes helping people and likes having flexibility for his kids.  Patient reports that yesterday, he was home alone all day and packing of his and his family's clothing items.  This is when he fell the pieces and feels that all of this is his fault.  It was at this time that he realized he needed help and called the office.  He denies HI.  Does not want hurt himself but feelings of worthlessness are constant.  He reports "every 1 would be better off if I did not exist".  Patient denies any plan and reports that he is too scared to do so.  He is scared to die and he would like to live as long as possible.  Knows there are people who love him and no suicide would be selfish.  Patient reports that he contacted the mood treatment center and is currently getting on board with them.  Pertinent PM/FHx: Bipolar I, Mood disorder  Social Hx: Former smoker . Denies ETOH & drug use.  ROS: Pertinent ROS included in HPI.  Exam:  General: Sitting  outside, NAD, appears well. Sporadically tearful. Affect appropriate. Good insight and judgement.    Assessment/Plan:  Severe episode of recurrent major depressive disorder, without psychotic features ([place]) Spoke with patient at great length today about his recent feelings of depression and worthlessness.  He is currently going through many life changes and has many stressors at home.  Dr. Oren Binet recommended that he follow-up with a psychiatrist or psychologist as soon as possible.  He has called mood disorder clinic on [address] and is currently doing intake with them.  Patient is already tapered himself off of the Effexor.  He reports that 1 mg of Klonopin is not helpful and that too is more helpful, which he has been self titrating recently.  Will provide patient with 2 mg of Klonopin as needed.  Patient is not to take over 4 mg daily.  Patient discouraged from taking Klonopin scheduled as this does not maximize the effect of the medicine.  Patient will need to develop tools and behaviors to help him during the times that he feels very anxious and worthless.  Patient denies any SI/HI.  He has many protective factors including his children, wife, mom.  He also does not want them to suffer if he were to do something as selfish as suicide.  He also reports that he is scared to die.   Considered starting Cymbalta as patient has chronic pain and osteoarthritis.  However, patient is on several other psychiatric medications that can have interactions and increased risk for serotonin syndrome.  Should consider this in the future after addressing other medications that he has.  He has not had a psychiatrist that he has followed with and has been managed very sporadically for his mood disorders due to poor follow-up.  Patient should establish with mood disorder clinic as soon as possible and follow-up with [place] as necessary. Plan discussed with Dr. Andria Frames and Dr. Hartford Poli      Time spent during visit with patient: 47  minutes  Wilber Oliphant, M.D.  1:16 PM 09/26/2019

## 2019-09-25 ENCOUNTER — Other Ambulatory Visit: Payer: Self-pay | Admitting: Family Medicine

## 2019-09-25 DIAGNOSIS — F3162 Bipolar disorder, current episode mixed, moderate: Secondary | ICD-10-CM

## 2019-09-26 MED ORDER — QUETIAPINE FUMARATE 50 MG PO TABS: 50.0000 mg | ORAL_TABLET | Freq: Every day | ORAL | 3 refills | Status: DC

## 2019-09-26 MED FILL — QUETIAPINE FUMARATE 50 MG T: 50 | 90 days supply | Qty: 90 | Fill #0

## 2019-09-27 MED FILL — VENLAFAXINE HCL ER 75 MG CA: 75 | 30 days supply | Qty: 90 | Fill #1

## 2019-10-04 ENCOUNTER — Encounter: Payer: Self-pay | Admitting: Family Medicine

## 2019-10-15 ENCOUNTER — Other Ambulatory Visit: Payer: Self-pay | Admitting: Family Medicine

## 2019-10-15 DIAGNOSIS — M455 Ankylosing spondylitis of thoracolumbar region: Secondary | ICD-10-CM

## 2019-10-17 MED ORDER — TIZANIDINE HCL 4 MG PO TABS: 4.0000 mg | ORAL_TABLET | Freq: Three times a day (TID) | ORAL | 5 refills | Status: DC | PRN

## 2019-10-17 MED FILL — tiZANidine HCL 4 MG TABS: 4 | 30 days supply | Qty: 45 | Fill #0

## 2019-10-19 ENCOUNTER — Ambulatory Visit: Payer: 59 | Attending: Internal Medicine

## 2019-10-19 DIAGNOSIS — Z20822 Contact with and (suspected) exposure to covid-19: Secondary | ICD-10-CM

## 2019-10-20 ENCOUNTER — Encounter: Payer: Self-pay | Admitting: Family Medicine

## 2019-10-20 LAB — NOVEL CORONAVIRUS, NAA: SARS-CoV-2, NAA: DETECTED — AB

## 2019-10-21 ENCOUNTER — Telehealth: Payer: Self-pay | Admitting: Nurse Practitioner

## 2019-10-21 NOTE — Telephone Encounter (Signed)
Called to Discuss with patient about Covid symptoms and the use of bamlanivimab, a monoclonal antibody infusion for those with mild to moderate Covid symptoms and at a high risk of hospitalization.     Pt is qualified for this infusion at the [facility] infusion center due to co-morbid conditions and/or a member of an at-risk group.     Patient Active Problem List   Diagnosis Date Noted  . Severe episode of recurrent major depressive disorder, without psychotic features (HCC) 09/23/2019  . Contraception management 08/15/2016  . Healthcare maintenance 05/02/2016  . Microalbuminuria 04/14/2016  . Encounter for chronic pain management 04/10/2016  . OCD (obsessive compulsive disorder) 04/10/2016  . Chronic pain syndrome 02/26/2016  . Vitamin D deficiency 02/26/2016  . Bipolar 1 disorder, mixed, moderate (HCC) 02/22/2016  . Migraines 02/22/2016  . Osteoarthritis of both knees 02/22/2016  . Morbid obesity (HCC) 08/08/2015  . HTN (hypertension) 08/08/2015  . Diabetes mellitus type 2 in obese (HCC) 08/08/2015  . Metabolic syndrome 08/08/2015  . Hypertriglyceridemia 08/08/2015  . Hypercholesteremia 08/08/2015  . Ankylosing spondylitis (HCC) 08/08/2015  . S/P gastric bypass 08/06/2015    Patient states that he is not having any symptoms at this time. Symptoms tier reviewed as well as criteria for ending isolation. Preventative practices reviewed. Patient verbalized understanding.    Patient advised to call back if he does develop symptoms and decides that he does want to get infusion. Callback number to the infusion center given. Patient advised to go to Urgent care or ED with severe symptoms.

## 2019-10-25 ENCOUNTER — Telehealth: Payer: Self-pay | Admitting: Psychology

## 2019-11-03 ENCOUNTER — Ambulatory Visit (INDEPENDENT_AMBULATORY_CARE_PROVIDER_SITE_OTHER): Payer: 59 | Admitting: Family Medicine

## 2019-11-03 ENCOUNTER — Other Ambulatory Visit: Payer: Self-pay

## 2019-11-03 ENCOUNTER — Encounter: Payer: Self-pay | Admitting: Family Medicine

## 2019-11-03 VITALS — BP 118/82 | HR 85 | Ht 72.0 in | Wt 312.6 lb

## 2019-11-03 DIAGNOSIS — F3162 Bipolar disorder, current episode mixed, moderate: Secondary | ICD-10-CM | POA: Diagnosis not present

## 2019-11-03 DIAGNOSIS — G43001 Migraine without aura, not intractable, with status migrainosus: Secondary | ICD-10-CM

## 2019-11-03 DIAGNOSIS — M455 Ankylosing spondylitis of thoracolumbar region: Secondary | ICD-10-CM | POA: Diagnosis not present

## 2019-11-03 DIAGNOSIS — E119 Type 2 diabetes mellitus without complications: Secondary | ICD-10-CM

## 2019-11-03 DIAGNOSIS — E1169 Type 2 diabetes mellitus with other specified complication: Secondary | ICD-10-CM

## 2019-11-03 DIAGNOSIS — R809 Proteinuria, unspecified: Secondary | ICD-10-CM

## 2019-11-03 DIAGNOSIS — E669 Obesity, unspecified: Secondary | ICD-10-CM

## 2019-11-03 DIAGNOSIS — F332 Major depressive disorder, recurrent severe without psychotic features: Secondary | ICD-10-CM

## 2019-11-03 DIAGNOSIS — I10 Essential (primary) hypertension: Secondary | ICD-10-CM | POA: Diagnosis not present

## 2019-11-03 DIAGNOSIS — M450 Ankylosing spondylitis of multiple sites in spine: Secondary | ICD-10-CM | POA: Diagnosis not present

## 2019-11-03 LAB — POCT GLYCOSYLATED HEMOGLOBIN (HGB A1C): HbA1c, POC (controlled diabetic range): 11 % — AB (ref 0.0–7.0)

## 2019-11-03 MED ORDER — AMLODIPINE BESYLATE 5 MG PO TABS: 5.0000 mg | ORAL_TABLET | Freq: Every morning | ORAL | 3 refills | Status: DC

## 2019-11-03 MED ORDER — TRAMADOL HCL 50 MG PO TABS: 50.0000 mg | ORAL_TABLET | Freq: Four times a day (QID) | ORAL | 5 refills | Status: DC | PRN

## 2019-11-03 MED ORDER — SUMATRIPTAN SUCCINATE 50 MG PO TABS: 50.0000 mg | ORAL_TABLET | ORAL | 5 refills | Status: DC | PRN

## 2019-11-03 MED ORDER — EMPAGLIFLOZIN 25 MG PO TABS: 25.0000 mg | ORAL_TABLET | Freq: Every day | ORAL | 3 refills | Status: DC

## 2019-11-03 MED ORDER — CLONAZEPAM 1 MG PO TABS: 1.0000 mg | ORAL_TABLET | Freq: Two times a day (BID) | ORAL | 1 refills | Status: DC | PRN

## 2019-11-03 MED ORDER — LISINOPRIL 5 MG PO TABS: 5.0000 mg | ORAL_TABLET | Freq: Every day | ORAL | 3 refills | Status: DC

## 2019-11-03 MED ORDER — CARVEDILOL 12.5 MG PO TABS: 12.5000 mg | ORAL_TABLET | Freq: Two times a day (BID) | ORAL | 3 refills | Status: DC

## 2019-11-03 MED FILL — LISINOPRIL 5 MG TABS: 5 | 90 days supply | Qty: 90 | Fill #0

## 2019-11-03 MED FILL — clonazePAM 1 MG TABS: 1 | 90 days supply | Qty: 180 | Fill #0

## 2019-11-03 MED FILL — traMADol HCL 50 MG TABS: 50 | 30 days supply | Qty: 120 | Fill #0

## 2019-11-03 MED FILL — CARVEDILOL 12.5 MG TABLET: 12.5 | 90 days supply | Qty: 180 | Fill #0

## 2019-11-03 MED FILL — AMLODIPINE BESYLATE 5 MG TA: 5 | 90 days supply | Qty: 90 | Fill #0

## 2019-11-03 MED FILL — JARDIANCE 25 MG TABLET: 25 | 90 days supply | Qty: 90 | Fill #0

## 2019-11-03 MED FILL — SUMAtriptan SUCCINATE 50 MG: 50 | 30 days supply | Qty: 10 | Fill #0

## 2019-11-03 NOTE — Progress Notes (Signed)
a 

## 2019-11-03 NOTE — Patient Instructions (Addendum)
I am delighted that the combo of venlafaxine and your work with your wife if helping.  I strongly encourage the psychologist. I put in a referral.  I hope it works.  If not, check the Find a psychologist web site of call Dr. Shawnee Knapp back.  I started a new diabetes medication. See me in three months.

## 2019-11-04 ENCOUNTER — Encounter: Payer: Self-pay | Admitting: Family Medicine

## 2019-11-04 NOTE — Assessment & Plan Note (Signed)
Discussed diet and exercise 

## 2019-11-04 NOTE — Assessment & Plan Note (Signed)
Add jardiance.  Hopefully that will help his leg swelling also.  Emphasized exercise both for DM and as adjuvant Rx for depression.

## 2019-11-04 NOTE — Assessment & Plan Note (Signed)
Unclear whether to characterize as bipolar or as major depresion.  Regardless, he is improved.  Refer to psychology.

## 2019-11-04 NOTE — Progress Notes (Signed)
   Subjective:    Patient ID: Edward [name], male    DOB: 11/14/1970, 49 y.o.   MRN: [id number]  HPI As usual, too many issues: 1. FU depression.  Feels improved because he restarted his venlafexine and feels he is responding.  Also, he and his wife are working through their differences.  He still desires counseling.  Does not want to go through [facility].  No SI or HI.  Asks for refill on clonipin. 2. DM poor control.  A1C is actually 11.0 which is an improvement.  Currently only on metformin.  Weight still high.  Exercise is poor. 3. C/O some leg swelling.  No DOE.  Recent labs OK.     Review of Systems     Objective:   Physical Exam        Assessment & Plan:

## 2019-11-04 NOTE — Assessment & Plan Note (Signed)
Improved depression.  I refilled his clonipin at his lower chronic dose, not at the higher recent dose. Referred to psych.

## 2019-11-23 ENCOUNTER — Ambulatory Visit (INDEPENDENT_AMBULATORY_CARE_PROVIDER_SITE_OTHER): Payer: 59 | Admitting: Psychology

## 2019-11-23 DIAGNOSIS — F331 Major depressive disorder, recurrent, moderate: Secondary | ICD-10-CM | POA: Diagnosis not present

## 2019-12-01 MED FILL — MELOXICAM 15 MG TABLET: 15 | 30 days supply | Qty: 30 | Fill #1

## 2019-12-01 MED FILL — TRULICITY 0.75 MG/0.5 ML PE: 0.75 | 28 days supply | Qty: 2 | Fill #5

## 2019-12-15 ENCOUNTER — Ambulatory Visit (HOSPITAL_COMMUNITY)
Admission: RE | Admit: 2019-12-15 | Discharge: 2019-12-15 | Disposition: A | Discharge status: Home / Self Care | Payer: 59 | Source: Ambulatory Visit | Attending: Family Medicine | Admitting: Family Medicine

## 2019-12-15 ENCOUNTER — Other Ambulatory Visit: Payer: Self-pay

## 2019-12-15 ENCOUNTER — Encounter: Payer: Self-pay | Admitting: Family Medicine

## 2019-12-15 ENCOUNTER — Ambulatory Visit (INDEPENDENT_AMBULATORY_CARE_PROVIDER_SITE_OTHER): Payer: 59 | Admitting: Family Medicine

## 2019-12-15 VITALS — BP 108/82 | HR 91 | Wt 306.6 lb

## 2019-12-15 DIAGNOSIS — M450 Ankylosing spondylitis of multiple sites in spine: Secondary | ICD-10-CM | POA: Diagnosis not present

## 2019-12-15 DIAGNOSIS — E669 Obesity, unspecified: Secondary | ICD-10-CM

## 2019-12-15 DIAGNOSIS — F3162 Bipolar disorder, current episode mixed, moderate: Secondary | ICD-10-CM

## 2019-12-15 DIAGNOSIS — R079 Chest pain, unspecified: Secondary | ICD-10-CM | POA: Diagnosis not present

## 2019-12-15 DIAGNOSIS — I10 Essential (primary) hypertension: Secondary | ICD-10-CM

## 2019-12-15 DIAGNOSIS — E1169 Type 2 diabetes mellitus with other specified complication: Secondary | ICD-10-CM

## 2019-12-15 DIAGNOSIS — E7800 Pure hypercholesterolemia, unspecified: Secondary | ICD-10-CM

## 2019-12-15 DIAGNOSIS — R233 Spontaneous ecchymoses: Secondary | ICD-10-CM | POA: Insufficient documentation

## 2019-12-15 MED FILL — traMADol HCL 50 MG TABS: 50 | 30 days supply | Qty: 120 | Fill #1

## 2019-12-15 MED FILL — SUMAtriptan SUCCINATE 50 MG: 50 | 30 days supply | Qty: 10 | Fill #1

## 2019-12-15 NOTE — Patient Instructions (Addendum)
Please get an eye exam and have them send me the results I will call with the lab results. If things are going well, see me in April to recheck the diabetes. If you have more chest pain, call me and I will refer you to the cardiologist.  Nicki Reaper

## 2019-12-15 NOTE — Assessment & Plan Note (Signed)
Check platelet count 

## 2019-12-15 NOTE — Progress Notes (Addendum)
    SUBJECTIVE:   CHIEF COMPLAINT / HPI:   Chest pain and more. 1. Episode of left ant chest and left shoulder pain.  Sharp.  Denies nausea, vomiting, diaphoresis, palpitations, tachycardia or SOB.  Worsened with movement.  Now resolved.  Hx of Ankylosis Spondylitis.  Strong family hx of premature heart disease on maternal side.  He has never had heart problems.  On ASA and statin for primary prevention. 2. DM Poor control.  Last A1C 6 weeks ago.  11.0 down from 12.8.  He continues with impressive results from lifestyle modifications.  Great weight loss with healthy diet and exercise.  Due of foot exam and eye exam. 3. Stress.  He and wife have reconciled.  Less stress from that angle.  Autistic daughter has started puberty and is having major behavioral issues.  Peds psych is adjusting meds.   4. Morbid obesity.  Impressive wt loss continues. 5. No easy bruising.    OBJECTIVE:   BP 108/82   Pulse 91   Wt (!) 306 lb 9.6 oz (139.1 kg)   SpO2 98%   BMI 41.58 kg/m   Neck supple Lungs clear Cardiac RRR without m or g Abd benign Diabetic foot exam done.   Skin on feet petecchia  ASSESSMENT/PLAN:   Ankylosing spondylitis (HCC) Likely the cause of his chest and left shoulder pain.  Bipolar 1 disorder, mixed, moderate (HCC) Stable to improved.  Great progress with marriage.  Austic daughter's behavior is stressful  Diabetes mellitus type 2 in obese [facility]) Not at goal but stick with lifestyle changes for now.  Recheck A1C in 6 weeks.  Hypercholesteremia Recheck lipids.  Chest pain, unspecified Likely musculoskeletal.  I am more worried about the family history than the pain.  Check lipids.    Petechiae Check platelet count.     Moses Manners, MD [facility]

## 2019-12-15 NOTE — Assessment & Plan Note (Addendum)
Recheck lipids.  LDL cholesterol VERY high.  I tried calling patient but could not leave message because voicemail indicated "Edward [name]" Sent letter.  If he is not taking his pitavastatin and ezetimibe, he absolutely needs to and I will need to recheck direct LDL in 6-12 weeks on therapy.  If he is taking his medicine, I need to refer him to lipid clinic.

## 2019-12-15 NOTE — Assessment & Plan Note (Signed)
Likely musculoskeletal.  I am more worried about the family history than the pain.  Check lipids.

## 2019-12-15 NOTE — Assessment & Plan Note (Signed)
Likely the cause of his chest and left shoulder pain.

## 2019-12-15 NOTE — Assessment & Plan Note (Signed)
Stable to improved.  Great progress with marriage.  Austic daughter's behavior is stressful

## 2019-12-15 NOTE — Assessment & Plan Note (Signed)
Not at goal but stick with lifestyle changes for now.  Recheck A1C in 6 weeks.

## 2019-12-16 ENCOUNTER — Telehealth: Payer: Self-pay

## 2019-12-16 ENCOUNTER — Encounter: Payer: Self-pay | Admitting: Family Medicine

## 2019-12-16 LAB — LIPID PANEL
Chol/HDL Ratio: 6 ratio — ABNORMAL HIGH (ref 0.0–5.0)
Cholesterol, Total: 278 mg/dL — ABNORMAL HIGH (ref 100–199)
HDL: 46 mg/dL (ref >39)
LDL Chol Calc (NIH): 207 mg/dL — ABNORMAL HIGH (ref 0–99)
Triglycerides: 136 mg/dL (ref 0–149)
VLDL Cholesterol Cal: 25 mg/dL (ref 5–40)

## 2019-12-16 LAB — CBC
Hematocrit: 53.3 % — ABNORMAL HIGH (ref 37.5–51.0)
Hemoglobin: 17.7 g/dL (ref 13.0–17.7)
MCH: 28.8 pg (ref 26.6–33.0)
MCHC: 33.2 g/dL (ref 31.5–35.7)
MCV: 87 fL (ref 79–97)
Platelets: 342 10*3/uL (ref 150–450)
RBC: 6.14 x10E6/uL — ABNORMAL HIGH (ref 4.14–5.80)
RDW: 13.5 % (ref 11.6–15.4)
WBC: 10.2 10*3/uL (ref 3.4–10.8)

## 2019-12-16 LAB — CMP14+EGFR
ALT: 6 IU/L (ref 0–44)
AST: 17 IU/L (ref 0–40)
Albumin/Globulin Ratio: 1.4 (ref 1.2–2.2)
Albumin: 3.9 g/dL — ABNORMAL LOW (ref 4.0–5.0)
Alkaline Phosphatase: 111 IU/L (ref 39–117)
BUN/Creatinine Ratio: 16 (ref 9–20)
BUN: 15 mg/dL (ref 6–24)
Bilirubin Total: 0.3 mg/dL (ref 0.0–1.2)
CO2: 23 mmol/L (ref 20–29)
Calcium: 9.9 mg/dL (ref 8.7–10.2)
Chloride: 101 mmol/L (ref 96–106)
Creatinine, Ser: 0.96 mg/dL (ref 0.76–1.27)
GFR calc Af Amer: 108 mL/min/{1.73_m2} (ref >59)
GFR calc non Af Amer: 94 mL/min/{1.73_m2} (ref >59)
Globulin, Total: 2.8 g/dL (ref 1.5–4.5)
Glucose: 274 mg/dL — ABNORMAL HIGH (ref 65–99)
Potassium: 4.8 mmol/L (ref 3.5–5.2)
Sodium: 139 mmol/L (ref 134–144)
Total Protein: 6.7 g/dL (ref 6.0–8.5)

## 2019-12-16 LAB — TSH: TSH: 0.924 u[IU]/mL (ref 0.450–4.500)

## 2019-12-16 NOTE — Telephone Encounter (Signed)
Clarified that he was not taking chol meds prior to 2/25.  Will recheck direct LDL once he has been on meds regularly for at least 6 weeks.

## 2019-12-16 NOTE — Telephone Encounter (Signed)
Patient calls nurse line stating he just reviewed labs and PCP note on mychart. Patient stated as of 2/25, he has been taken Pitavastatin and Ezetimibe as prescribed. Therefore, he is not surprised at cholesterol results. Patient plans to continue medication regime with continued diet and exercise. Patient plans to FU in about 8 weeks to recheck.

## 2019-12-26 MED FILL — SUMAtriptan SUCCINATE 50 MG: 50 | 30 days supply | Qty: 10 | Fill #1

## 2019-12-26 MED FILL — traMADol HCL 50 MG TABS: 50 | 30 days supply | Qty: 120 | Fill #1

## 2019-12-30 MED FILL — tiZANidine HCL 4 MG TABS: 4 | 30 days supply | Qty: 45 | Fill #1

## 2020-01-30 MED FILL — tiZANidine HCL 4 MG TABS: 4 | 30 days supply | Qty: 45 | Fill #2

## 2020-01-30 MED FILL — traMADol HCL 50 MG TABS: 50 | 30 days supply | Qty: 120 | Fill #2

## 2020-04-02 MED FILL — traMADol HCL 50 MG TABS: 50 | 30 days supply | Qty: 120 | Fill #3

## 2020-04-02 MED FILL — tiZANidine HCL 4 MG TABS: 4 | 30 days supply | Qty: 45 | Fill #3

## 2020-04-03 MED FILL — clonazePAM 1 MG TABS: 1 | 90 days supply | Qty: 180 | Fill #1

## 2020-04-30 MED FILL — tiZANidine HCL 4 MG TABS: 4 | 30 days supply | Qty: 45 | Fill #4

## 2020-05-07 ENCOUNTER — Other Ambulatory Visit: Payer: Self-pay | Admitting: Family Medicine

## 2020-05-07 DIAGNOSIS — M450 Ankylosing spondylitis of multiple sites in spine: Secondary | ICD-10-CM

## 2020-05-07 DIAGNOSIS — M455 Ankylosing spondylitis of thoracolumbar region: Secondary | ICD-10-CM

## 2020-05-07 MED FILL — traMADol HCL 50 MG TABS: 50 | 7 days supply | Qty: 28 | Fill #0

## 2020-05-18 MED FILL — traMADol HCL 50 MG TABS: 50 | 7 days supply | Qty: 28 | Fill #0

## 2020-07-26 MED FILL — tiZANidine HCL 4 MG TABS: 4 | 30 days supply | Qty: 45 | Fill #5

## 2020-07-26 MED FILL — traMADol HCL 50 MG TABS: 50 | 7 days supply | Qty: 28 | Fill #1

## 2020-09-07 MED FILL — traMADol HCL 50 MG TABS: 50 | 7 days supply | Qty: 28 | Fill #2

## 2020-09-07 MED FILL — SUMAtriptan SUCCINATE 50 MG: 50 | 30 days supply | Qty: 9 | Fill #2

## 2020-10-17 ENCOUNTER — Ambulatory Visit: Payer: Self-pay | Admitting: Family Medicine

## 2020-10-22 ENCOUNTER — Ambulatory Visit: Payer: PRIVATE HEALTH INSURANCE | Admitting: Family Medicine

## 2020-10-22 ENCOUNTER — Encounter: Payer: Self-pay | Admitting: Family Medicine

## 2020-10-22 ENCOUNTER — Other Ambulatory Visit: Payer: Self-pay

## 2020-10-22 VITALS — BP 124/78 | HR 91 | Ht 72.0 in | Wt 281.6 lb

## 2020-10-22 DIAGNOSIS — G43001 Migraine without aura, not intractable, with status migrainosus: Secondary | ICD-10-CM

## 2020-10-22 DIAGNOSIS — E669 Obesity, unspecified: Secondary | ICD-10-CM

## 2020-10-22 DIAGNOSIS — F3162 Bipolar disorder, current episode mixed, moderate: Secondary | ICD-10-CM

## 2020-10-22 DIAGNOSIS — E7800 Pure hypercholesterolemia, unspecified: Secondary | ICD-10-CM

## 2020-10-22 DIAGNOSIS — M450 Ankylosing spondylitis of multiple sites in spine: Secondary | ICD-10-CM | POA: Diagnosis not present

## 2020-10-22 DIAGNOSIS — E1169 Type 2 diabetes mellitus with other specified complication: Secondary | ICD-10-CM

## 2020-10-22 DIAGNOSIS — M455 Ankylosing spondylitis of thoracolumbar region: Secondary | ICD-10-CM

## 2020-10-22 LAB — POCT GLYCOSYLATED HEMOGLOBIN (HGB A1C): Hemoglobin A1C: 9.9 % — AB (ref 4.0–5.6)

## 2020-10-22 MED ORDER — TRAMADOL HCL 50 MG PO TABS: 50.0000 mg | ORAL_TABLET | Freq: Four times a day (QID) | ORAL | 5 refills | Status: DC | PRN

## 2020-10-22 MED ORDER — QUETIAPINE FUMARATE 50 MG PO TABS: 50.0000 mg | ORAL_TABLET | Freq: Every day | ORAL | 3 refills | Status: DC

## 2020-10-22 MED ORDER — TIZANIDINE HCL 4 MG PO TABS: 4.0000 mg | ORAL_TABLET | Freq: Four times a day (QID) | ORAL | 3 refills | Status: DC | PRN

## 2020-10-22 MED ORDER — CLONAZEPAM 1 MG PO TABS
1.0000 mg | ORAL_TABLET | Freq: Two times a day (BID) | ORAL | 1 refills | Status: DC | PRN
Start: 2020-10-22 — End: 2021-04-22

## 2020-10-22 NOTE — Patient Instructions (Signed)
Please keep the appointment for the Rheumatologist.  I worry that you need to be on a disease modifying drug as we discussed. I refilled your medications. The blood work I would like (last done mid March) is a CBC, CMP, TSH, and lipid panel.  If you can get a [facility] doctor to order these, you may save money. See me in three months.  I strongly believe the best long term treatment of your many problems is continued weight loss.  Keep up the good work.

## 2020-10-22 NOTE — Progress Notes (Signed)
    SUBJECTIVE:   CHIEF COMPLAINT / HPI:   FU multiple problems: Note, wife now working for [employer]. I am out of network.  He chooses to continue to see me because he feels we have a strong therapeutic relationship. 1. Ankylosing Spondylitis.  Chronic pain remain.  He is off Humara.  He has an appointment with a [facility].   2. Bipolar.  Actually feels in a much better place than previously.  He and wife are communicating very well thanks to good counseling.  He has developed more coping strategies with his autistic teenage daughter.  Wants to continue same meds. 3. High cholesterol.  Now on rosuvastatin (med list carefully updated.) 4. Out of network.  I am not sure if blood work I order will be covered.   5. DM A1C has improved but he is not yet at goal.  He was begun on Ozempic and has seen his weight loss improve.  He has made major lifestyle changes.  Max weight was 450 lbs.  He has lost ~170 lbs from his max.      OBJECTIVE:   BP 124/78   Pulse 91   Ht 6' (1.829 m)   Wt 281 lb 9.6 oz (127.7 kg)   SpO2 97%   BMI 38.19 kg/m   VS and weight noted Lungs clear Cardiac RRR without m or g Mood and cognition good throughout visit.  ASSESSMENT/PLAN:   Diabetes mellitus type 2 in obese (HCC) Improved but not at goal.  He is on a recommended combo of metformin, SGLT2 and GLP1.  Weight loss continues.  No change in meds for now.  Bipolar 1 disorder, mixed, moderate (HCC) Seems nicely controled on current meds.  Continue  Ankylosing spondylitis (HCC) Encouraged to keep rheum appointment.  I believe he should be on a DMARD in addition to his chronic pain regimen.  Hypercholesteremia Need repeat lipids on crestor.  I hope he can get [facility] Doc to order to avoid out of pocket cost.     Moses Manners, MD [facility]

## 2020-10-22 NOTE — Assessment & Plan Note (Signed)
Need repeat lipids on crestor.  I hope he can get [facility] Doc to order to avoid out of pocket cost.

## 2020-10-22 NOTE — Assessment & Plan Note (Signed)
Encouraged to keep rheum appointment.  I believe he should be on a DMARD in addition to his chronic pain regimen.

## 2020-10-22 NOTE — Assessment & Plan Note (Signed)
Improved but not at goal.  He is on a recommended combo of metformin, SGLT2 and GLP1.  Weight loss continues.  No change in meds for now.

## 2020-10-22 NOTE — Assessment & Plan Note (Signed)
Seems nicely controled on current meds.  Continue

## 2021-03-05 ENCOUNTER — Encounter: Payer: Self-pay | Admitting: Family Medicine

## 2021-03-05 ENCOUNTER — Other Ambulatory Visit (HOSPITAL_COMMUNITY): Payer: Self-pay

## 2021-03-05 DIAGNOSIS — G43001 Migraine without aura, not intractable, with status migrainosus: Secondary | ICD-10-CM

## 2021-03-05 MED ORDER — SUMATRIPTAN SUCCINATE 50 MG PO TABS
50.0000 mg | ORAL_TABLET | ORAL | 5 refills | Status: DC | PRN
Start: 2021-03-05 — End: 2022-04-28
  Filled 2021-03-05 – 2021-04-30 (×3): qty 9, 30d supply, fill #0

## 2021-03-05 MED ORDER — VENLAFAXINE HCL ER 75 MG PO CP24
225.0000 mg | ORAL_CAPSULE | Freq: Every day | ORAL | 3 refills | Status: DC
  Filled 2021-03-05 – 2021-04-19 (×4): qty 90, 30d supply, fill #0

## 2021-03-13 ENCOUNTER — Other Ambulatory Visit (HOSPITAL_COMMUNITY): Payer: Self-pay

## 2021-04-19 ENCOUNTER — Other Ambulatory Visit (HOSPITAL_COMMUNITY): Payer: Self-pay

## 2021-04-22 ENCOUNTER — Encounter: Payer: Self-pay | Admitting: Family Medicine

## 2021-04-22 DIAGNOSIS — G43001 Migraine without aura, not intractable, with status migrainosus: Secondary | ICD-10-CM

## 2021-04-22 DIAGNOSIS — F3162 Bipolar disorder, current episode mixed, moderate: Secondary | ICD-10-CM

## 2021-04-24 MED ORDER — VENLAFAXINE HCL ER 75 MG PO CP24: 225.0000 mg | ORAL_CAPSULE | Freq: Every day | ORAL | 3 refills | Status: DC

## 2021-04-24 MED ORDER — GABAPENTIN 300 MG PO CAPS: 300.0000 mg | ORAL_CAPSULE | Freq: Three times a day (TID) | ORAL | 3 refills | Status: DC

## 2021-04-24 MED ORDER — SEMAGLUTIDE (1 MG/DOSE) 4 MG/3ML ~~LOC~~ SOPN: 1.0000 mg | PEN_INJECTOR | SUBCUTANEOUS | 0 refills | Status: DC

## 2021-04-24 MED ORDER — CLONAZEPAM 1 MG PO TABS: 1.0000 mg | ORAL_TABLET | Freq: Two times a day (BID) | ORAL | 0 refills | Status: DC | PRN

## 2021-04-29 ENCOUNTER — Other Ambulatory Visit (HOSPITAL_COMMUNITY): Payer: Self-pay

## 2021-04-30 ENCOUNTER — Other Ambulatory Visit (HOSPITAL_COMMUNITY): Payer: Self-pay

## 2021-05-02 ENCOUNTER — Other Ambulatory Visit: Payer: Self-pay

## 2021-05-02 ENCOUNTER — Ambulatory Visit (INDEPENDENT_AMBULATORY_CARE_PROVIDER_SITE_OTHER): Payer: No Typology Code available for payment source | Admitting: Family Medicine

## 2021-05-02 VITALS — BP 110/85 | HR 86 | Wt 289.2 lb

## 2021-05-02 DIAGNOSIS — M455 Ankylosing spondylitis of thoracolumbar region: Secondary | ICD-10-CM

## 2021-05-02 DIAGNOSIS — E1169 Type 2 diabetes mellitus with other specified complication: Secondary | ICD-10-CM

## 2021-05-02 DIAGNOSIS — E669 Obesity, unspecified: Secondary | ICD-10-CM

## 2021-05-02 LAB — POCT GLYCOSYLATED HEMOGLOBIN (HGB A1C): HbA1c, POC (controlled diabetic range): 9.5 % — AB (ref 0.0–7.0)

## 2021-05-02 MED ORDER — MELOXICAM 15 MG PO TABS: 15.0000 mg | ORAL_TABLET | Freq: Every day | ORAL | 0 refills | Status: DC | PRN

## 2021-05-02 NOTE — Progress Notes (Signed)
    SUBJECTIVE:   CHIEF COMPLAINT / HPI:   Low back pain Patient reports chronic low back pain due to ankylosing spondylitis.  He reports that he usually takes tramadol 4 times a day but over the last month and a half he has been having a "flare".  He reports that he cannot get comfortable and is having difficulty moving around throughout the day because of this low back pain.  Reports that he was on meloxicam in the past which helped greatly but chronic use was discontinued because of his history of gastric bypass.  Patient reports that he has an upcoming appointment with Dr. Leveda Anna on 3 August but needs something to tide him over until that appointment.  Reports that he stretches, goes to the pool.  Denies any red flag symptoms such as loss of bowel or bladder function.  Diabetes Patient reports that his blood sugars have not been well controlled.  Denies any low blood sugars.  Reports that he just increased his dose to visit back to 1 from 0.5.  He has not taken this new dose yet and is going to start this week.  Previous hemoglobin A1c in July 2022 was 9.9. OBJECTIVE:   BP 110/85   Pulse 86   Wt 289 lb 3.2 oz (131.2 kg)   SpO2 95%   BMI 39.22 kg/m   General: Obese 50 year old male, no acute distress, looks uncomfortable, appears tired Cardiac: Regular rate and rhythm, no murmurs appreciated Respiratory: Normal work of breathing, lungs clear to auscultation bilaterally MSK: Patient has paraspinal tenderness that radiates to his hip mainly on the right side in the lumbar region.  Patient also has thoracic paraspinal tenderness.  No bony tenderness to palpation.  Full range of motion limited by pain.  Patient is able to ambulate with a walker.  ASSESSMENT/PLAN:   Diabetes mellitus type 2 in obese (HCC) Slightly improved from previous evaluations.  Hemoglobin A1c 9.5 today from 9.9 at previous check.  Is increasing his Ozempic to 1 this week.  We will continue current regimen and follow-up  in 3 months for repeat hemoglobin A1c and further adjustment of medications.  Ankylosing spondylitis (HCC) Patient continues to have issues with ankylosing spondylitis.  Currently taking tramadol 4 times daily.  No red flag symptoms identified and reassuring physical exam.  Gave short course of meloxicam today to hopefully help "flare" resolved.  Patient will follow up with Dr. Leveda Anna on August 3.     Derrel Nip, MD [facility]

## 2021-05-02 NOTE — Patient Instructions (Signed)
It was great seeing you today!  I am sorry you are having these issues with your back pain.  I am going to give you 1 month of meloxicam.  Please take this as needed and do not take it daily because of your stomach issues.  You can follow-up with Dr. Leveda Anna to discuss further management of your low back pain.  Regarding your diabetes I want to continue to monitor because you just increase the dose of Ozempic.  I would like for you to follow-up in 3 months for repeat hemoglobin A1c and you may need additional medications.  If you have any questions or concerns please feel free to call the clinic.  I hope you have a wonderful afternoon!

## 2021-05-03 NOTE — Assessment & Plan Note (Signed)
Patient continues to have issues with ankylosing spondylitis.  Currently taking tramadol 4 times daily.  No red flag symptoms identified and reassuring physical exam.  Gave short course of meloxicam today to hopefully help "flare" resolved.  Patient will follow up with Dr. Leveda Anna on August 3.

## 2021-05-03 NOTE — Assessment & Plan Note (Signed)
Slightly improved from previous evaluations.  Hemoglobin A1c 9.5 today from 9.9 at previous check.  Is increasing his Ozempic to 1 this week.  We will continue current regimen and follow-up in 3 months for repeat hemoglobin A1c and further adjustment of medications.

## 2021-05-08 ENCOUNTER — Ambulatory Visit: Payer: PRIVATE HEALTH INSURANCE | Admitting: Family Medicine

## 2021-05-15 ENCOUNTER — Ambulatory Visit: Payer: No Typology Code available for payment source | Admitting: Family Medicine

## 2021-05-15 ENCOUNTER — Other Ambulatory Visit: Payer: Self-pay | Admitting: Family Medicine

## 2021-05-15 ENCOUNTER — Ambulatory Visit: Payer: No Typology Code available for payment source

## 2021-05-15 ENCOUNTER — Ambulatory Visit
Admission: RE | Admit: 2021-05-15 | Discharge: 2021-05-15 | Disposition: A | Discharge status: Home / Self Care | Payer: No Typology Code available for payment source | Source: Ambulatory Visit | Attending: Family Medicine | Admitting: Family Medicine

## 2021-05-15 ENCOUNTER — Encounter: Payer: Self-pay | Admitting: Family Medicine

## 2021-05-15 ENCOUNTER — Other Ambulatory Visit: Payer: Self-pay

## 2021-05-15 VITALS — BP 144/99 | HR 94 | Ht 72.0 in | Wt 283.8 lb

## 2021-05-15 DIAGNOSIS — M450 Ankylosing spondylitis of multiple sites in spine: Secondary | ICD-10-CM

## 2021-05-15 DIAGNOSIS — I10 Essential (primary) hypertension: Secondary | ICD-10-CM

## 2021-05-15 DIAGNOSIS — Z23 Encounter for immunization: Secondary | ICD-10-CM | POA: Diagnosis not present

## 2021-05-15 DIAGNOSIS — E7800 Pure hypercholesterolemia, unspecified: Secondary | ICD-10-CM

## 2021-05-15 DIAGNOSIS — E669 Obesity, unspecified: Secondary | ICD-10-CM

## 2021-05-15 DIAGNOSIS — G8929 Other chronic pain: Secondary | ICD-10-CM

## 2021-05-15 DIAGNOSIS — M79604 Pain in right leg: Secondary | ICD-10-CM | POA: Diagnosis not present

## 2021-05-15 DIAGNOSIS — G894 Chronic pain syndrome: Secondary | ICD-10-CM

## 2021-05-15 DIAGNOSIS — E1169 Type 2 diabetes mellitus with other specified complication: Secondary | ICD-10-CM

## 2021-05-15 DIAGNOSIS — M455 Ankylosing spondylitis of thoracolumbar region: Secondary | ICD-10-CM

## 2021-05-15 DIAGNOSIS — Z1159 Encounter for screening for other viral diseases: Secondary | ICD-10-CM

## 2021-05-15 MED ORDER — MELOXICAM 15 MG PO TABS: 15.0000 mg | ORAL_TABLET | Freq: Every day | ORAL | 2 refills | Status: DC | PRN

## 2021-05-15 NOTE — Patient Instructions (Signed)
I will call with lab and xray results Someone should call to set up the rheumatology appointment. Please do get your eye exam and make sure they send me a copy of the results. See me in one month.  Then we can look at next steps with the pain and falls.  Also, we can look more at the diabetes and high blood pressure. My goal is to get the pain under better control so that you can be more active and get back on track with weight loss. I am glad that the mental health stuff is good right now.

## 2021-05-16 ENCOUNTER — Encounter: Payer: Self-pay | Admitting: Family Medicine

## 2021-05-16 ENCOUNTER — Other Ambulatory Visit: Payer: Self-pay | Admitting: Family Medicine

## 2021-05-16 DIAGNOSIS — F3162 Bipolar disorder, current episode mixed, moderate: Secondary | ICD-10-CM

## 2021-05-16 DIAGNOSIS — M450 Ankylosing spondylitis of multiple sites in spine: Secondary | ICD-10-CM

## 2021-05-16 DIAGNOSIS — M455 Ankylosing spondylitis of thoracolumbar region: Secondary | ICD-10-CM

## 2021-05-16 DIAGNOSIS — G43001 Migraine without aura, not intractable, with status migrainosus: Secondary | ICD-10-CM

## 2021-05-16 LAB — CMP14+EGFR
ALT: 6 IU/L (ref 0–44)
AST: 14 IU/L (ref 0–40)
Albumin/Globulin Ratio: 1.5 (ref 1.2–2.2)
Albumin: 3.8 g/dL — ABNORMAL LOW (ref 4.0–5.0)
Alkaline Phosphatase: 102 IU/L (ref 44–121)
BUN/Creatinine Ratio: 14 (ref 9–20)
BUN: 12 mg/dL (ref 6–24)
Bilirubin Total: 0.4 mg/dL (ref 0.0–1.2)
CO2: 25 mmol/L (ref 20–29)
Calcium: 9.4 mg/dL (ref 8.7–10.2)
Chloride: 103 mmol/L (ref 96–106)
Creatinine, Ser: 0.88 mg/dL (ref 0.76–1.27)
Globulin, Total: 2.5 g/dL (ref 1.5–4.5)
Glucose: 158 mg/dL — ABNORMAL HIGH (ref 65–99)
Potassium: 4.5 mmol/L (ref 3.5–5.2)
Sodium: 144 mmol/L (ref 134–144)
Total Protein: 6.3 g/dL (ref 6.0–8.5)
eGFR: 105 mL/min/{1.73_m2} (ref >59)

## 2021-05-16 LAB — LIPID PANEL
Chol/HDL Ratio: 4.5 ratio (ref 0.0–5.0)
Cholesterol, Total: 285 mg/dL — ABNORMAL HIGH (ref 100–199)
HDL: 63 mg/dL (ref >39)
LDL Chol Calc (NIH): 190 mg/dL — ABNORMAL HIGH (ref 0–99)
Triglycerides: 174 mg/dL — ABNORMAL HIGH (ref 0–149)
VLDL Cholesterol Cal: 32 mg/dL (ref 5–40)

## 2021-05-16 LAB — HEPATITIS C ANTIBODY: Hep C Virus Ab: <0.1 s/co ratio (ref 0.0–0.9)

## 2021-05-16 MED ORDER — CLONAZEPAM 1 MG PO TABS: 1.0000 mg | ORAL_TABLET | Freq: Two times a day (BID) | ORAL | 1 refills | Status: DC | PRN

## 2021-05-17 ENCOUNTER — Encounter: Payer: Self-pay | Admitting: Family Medicine

## 2021-05-17 ENCOUNTER — Other Ambulatory Visit: Payer: Self-pay | Admitting: Family Medicine

## 2021-05-17 DIAGNOSIS — Z23 Encounter for immunization: Secondary | ICD-10-CM | POA: Diagnosis not present

## 2021-05-17 DIAGNOSIS — G894 Chronic pain syndrome: Secondary | ICD-10-CM

## 2021-05-17 DIAGNOSIS — E7800 Pure hypercholesterolemia, unspecified: Secondary | ICD-10-CM

## 2021-05-17 DIAGNOSIS — M79604 Pain in right leg: Secondary | ICD-10-CM

## 2021-05-17 DIAGNOSIS — M170 Bilateral primary osteoarthritis of knee: Secondary | ICD-10-CM

## 2021-05-17 MED ORDER — ROSUVASTATIN CALCIUM 20 MG PO TABS: 20.0000 mg | ORAL_TABLET | Freq: Every day | ORAL | 3 refills | Status: DC

## 2021-05-17 MED ORDER — GABAPENTIN 300 MG PO CAPS: 600.0000 mg | ORAL_CAPSULE | Freq: Three times a day (TID) | ORAL | 3 refills | Status: DC

## 2021-05-17 NOTE — Assessment & Plan Note (Signed)
When I called to terrible LDL result, he admitted to not taking crestor regular.  He will start and we can recheck direct LDL in 3 months.

## 2021-05-17 NOTE — Assessment & Plan Note (Deleted)
Difficult to sort out the various possible causes of pain. 

## 2021-05-17 NOTE — Assessment & Plan Note (Signed)
Poor control.  He answer to most of his poorly controled chronic medical problems is better pain control so that he can be more active.

## 2021-05-17 NOTE — Progress Notes (Signed)
    SUBJECTIVE:   CHIEF COMPLAINT / HPI:   [name] repeats his pattern of delaying follow up and then coming in with two many problems to cover well.  Here we go: Chronic back pain.  Carries the Dx of ankylosing spondylitis.  Not on DMARD.  He has done poorly on a few in the past.  C/O severe back pain.  With right leg symptoms (radiculopathy?)  No bowel or bladder change.  Wondered if I would try prescribing Xeljanz Diabetes Control is poor with A1C =9.5  On multiple meds  Next step is insulin.  States that he has not done well with exercise because of the MSK pain.   Right leg pain.  Describes knee and hip pain that will grab him and has caused falls.  No x rays in our system.  Description sounds both arthralgias and radicular. Hypertension.  BP is up today and he is in pain.  No CP or SOB. High cholesterol on rosuvastatin.  (I would find out later that he is not taking regularly. Wants to apply for disability.  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 HPDP: Agrees to COVID second shot.  Plans eye exam. Bipolar: States he is in a good place right now on current meds.    OBJECTIVE:   BP (!) 144/99   Pulse 94   Ht 6' (1.829 m)   Wt 283 lb 12.8 oz (128.7 kg)   SpO2 99%   BMI 38.49 kg/m   VS and wt noted. Lungs clear Cardiac RRR without m or g Gait is normal in office.  ASSESSMENT/PLAN:   Ankylosing spondylitis (HCC) Per hx.  Surprised not AS changes on lumbar spine.  Refer to rheum  Encounter for hepatitis C screening test for low risk patient Low risk, screen x 1  Diabetes mellitus type 2 in obese (HCC) Poor control.  Not ready to start insulin.  Focus on pain control to improve exercise.  Encounter for chronic pain management Difficult to sort out the various possible causes of pain.  HTN (hypertension) Unclear control.  Is today's elevated reading due to pain or poor control.  No change today.  FU one month.  Hypercholesteremia When I  called to terrible LDL result, he admitted to not taking crestor regular.  He will start and we can recheck direct LDL in 3 months.  Morbid obesity (HCC) Poor control.  He answer to most of his poorly controled chronic medical problems is better pain control so that he can be more active.  Right leg pain Radiculopathy versus arthritis - likley both.  Increase gabapentin.     Edward Manners, MD [facility]

## 2021-05-17 NOTE — Assessment & Plan Note (Signed)
Radiculopathy versus arthritis - likley both.  Increase gabapentin.

## 2021-05-17 NOTE — Assessment & Plan Note (Signed)
Given radicular type leg pain, will increase his gabapentin.

## 2021-05-17 NOTE — Assessment & Plan Note (Signed)
Poor control.  Not ready to start insulin.  Focus on pain control to improve exercise.

## 2021-05-17 NOTE — Assessment & Plan Note (Signed)
Difficult to sort out the various possible causes of pain.

## 2021-05-17 NOTE — Assessment & Plan Note (Signed)
Xray confirms osteoarthritis right knee

## 2021-05-17 NOTE — Assessment & Plan Note (Signed)
Unclear control.  Is today's elevated reading due to pain or poor control.  No change today.  FU one month.

## 2021-05-17 NOTE — Assessment & Plan Note (Signed)
Low risk, screen x 1 

## 2021-05-17 NOTE — Assessment & Plan Note (Signed)
Per hx.  Surprised not AS changes on lumbar spine.  Refer to rheum

## 2021-05-17 NOTE — Assessment & Plan Note (Signed)
Negative plain films right hip.  Suspect radiculopathy as cause of pain.

## 2021-06-10 ENCOUNTER — Other Ambulatory Visit (HOSPITAL_COMMUNITY): Payer: Self-pay

## 2021-06-10 ENCOUNTER — Ambulatory Visit: Payer: No Typology Code available for payment source | Admitting: Family Medicine

## 2021-06-10 ENCOUNTER — Other Ambulatory Visit: Payer: Self-pay

## 2021-06-10 ENCOUNTER — Encounter: Payer: Self-pay | Admitting: Family Medicine

## 2021-06-10 DIAGNOSIS — M450 Ankylosing spondylitis of multiple sites in spine: Secondary | ICD-10-CM

## 2021-06-10 DIAGNOSIS — E1169 Type 2 diabetes mellitus with other specified complication: Secondary | ICD-10-CM | POA: Diagnosis not present

## 2021-06-10 DIAGNOSIS — Z1211 Encounter for screening for malignant neoplasm of colon: Secondary | ICD-10-CM | POA: Diagnosis not present

## 2021-06-10 DIAGNOSIS — E669 Obesity, unspecified: Secondary | ICD-10-CM

## 2021-06-10 DIAGNOSIS — E7800 Pure hypercholesterolemia, unspecified: Secondary | ICD-10-CM | POA: Diagnosis not present

## 2021-06-10 MED ORDER — GABAPENTIN 800 MG PO TABS
800.0000 mg | ORAL_TABLET | Freq: Three times a day (TID) | ORAL | 3 refills | Status: DC
  Filled 2021-06-10: qty 270, 90d supply, fill #0

## 2021-06-10 NOTE — Assessment & Plan Note (Addendum)
Dx is unclear to me.  I am glad he is seeing a rheumatologist.  This sounds more like radiculopathy than inflamatory arthropathy. For now, increase gabapentin.

## 2021-06-10 NOTE — Patient Instructions (Addendum)
You should get your cholesterol rechecked at the end of Oct, beginning of Nov.  The order is already in so you will only need a lab appointment. You will also get your A1C checked in 2 months.  I hope it comes down with diet and exercise.  Next medication is insulin.   I am glad you are getting an eye exam.  Please make sure they send me a copy of the results. I will put in a referral to [facility] for the colonoscopy.  Someone should call with the PT referra I increased your gabapentin to 800 mg 3 times per day. I am glad you are seeing the rheumatologist.  I am a little perplexed about exactly what is going on.

## 2021-06-12 ENCOUNTER — Encounter: Payer: Self-pay | Admitting: Family Medicine

## 2021-06-12 NOTE — Assessment & Plan Note (Signed)
Recheck LDL after on meds regularly x 3 months.  Future order entered.

## 2021-06-12 NOTE — Assessment & Plan Note (Signed)
Contributes to his back and leg pain and also to his DM, HBP and high cholesterol.  I hope with increased gabapentin and rheum consult we can get pain better controled so that he can improve his exercise and get back to weight loss.

## 2021-06-12 NOTE — Progress Notes (Signed)
    SUBJECTIVE:   CHIEF COMPLAINT / HPI:   Multiple issues DM  A1C one month ago = 9.5.  Following diet.  Has not been able to be more active due to pain.  This is the same story as the last visit. Back and right leg pain.  States still severe and also states some improvement with increased gabapentin.  Significant pain at right knee and also describes shooting pain down leg.  Carries dx of ankylosing spondylitis for years.  I am surprised he does not have more changes on recent plain films.  Has an appointment in 2 weeks to see rheum. High ldl.  Was not taking statin regularly.  Now is.      OBJECTIVE:   BP (!) 128/92   Pulse 92   Ht 6' (1.829 m)   Wt 280 lb 12.8 oz (127.4 kg)   SpO2 95%   BMI 38.08 kg/m   Lungs clear Cardiac RRR without m or g Low back moderate spasm Hips, good flexibility bilaterally. Rt knee, no effusion.  ASSESSMENT/PLAN:   Ankylosing spondylitis (HCC) Dx is unclear to me.  I am glad he is seeing a rheumatologist.  This sounds more like radiculopathy than inflamatory arthropathy. For now, increase gabapentin.  Hypercholesteremia Recheck LDL after on meds regularly x 3 months.  Future order entered.  Diabetes mellitus type 2 in obese (HCC) Poor control.  Wants to avoid insulin which is the next step.  Focus on pain control so that he can increase activity.    Morbid obesity (HCC) Contributes to his back and leg pain and also to his DM, HBP and high cholesterol.  I hope with increased gabapentin and rheum consult we can get pain better controled so that he can improve his exercise and get back to weight loss.    Moses Manners, MD [facility]

## 2021-06-12 NOTE — Assessment & Plan Note (Signed)
Poor control.  Wants to avoid insulin which is the next step.  Focus on pain control so that he can increase activity.

## 2021-07-01 NOTE — Progress Notes (Signed)
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 Patient complains of generalized pain along with many other symptoms. )   History of Present Illness: Edward [name] is a 50 y.o. male here for back pain and joint pains with history of ankylosing spondylitis.  He has a longstanding disease history with original diagnosis in late teenage and adult years.  Treatments have primarily consisted of chronic long-term use of oral NSAIDs switching between several different medications over time.  He has also had prior knee surgeries with problems from right worse than left osteoarthritis arthroscopy for injuries related to football and benign bone mass excision.  About 6 years ago was seen in rheumatology and recommended trial of DMARD treatment he took Humira and Enbrel.  He developed a significant pneumonia within 1 year of each medication and discontinued these.  He never felt a huge improvement on either medicine.  He also continues to have intermittent exacerbations and development of impingement and radicular type pain down 1 extremity or another usually 1 side at a time and lasting for up to a few months.  At present he has more trouble on the right side in both upper and lower back and going down around the lateral hip.  He is having some symptom improvement with oral meloxicam and tramadol prescribed through his PCP office at low-dose has been a good benefit. He saw Dr. Gerilyn Nestle in 2019 but apparently not much follow up or ability to continue treatment at least in part due to change in insurance status. Interestingly his most recent x-ray studies did not report significant sacroiliitis changes in the lumbar spine and pelvis.  Prior imaging from May 2016 in  [facility] system was reported consistent with inflammatory changes.  Labs reviewed Previous labs HLA-B27 pos RF neg CCP neg  05/2021 CMP wnl HCV neg  Imaging reviewed 05/15/21 Xray right hip and pelvis FINDINGS: There is no evidence of hip fracture or dislocation. There is no evidence of arthropathy or other focal bone abnormality. IMPRESSION: Negative.  05/15/21 Xray right knee FINDINGS: Negative for fracture or bone lesion. No significant joint effusion. Mild degenerative change in the lateral joint space and patellofemoral joint. IMPRESSION: Mild degenerative change.  05/15/21 Xray lumbar spine 2 views FINDINGS: Normal alignment.  No fracture or mass. Mild disc space narrowing L4-5. Disc space narrowing and spurring L5-S1. Asymmetric facet degeneration on the right L5-S1. No evidence of ankylosis in the lumbar spine.  SI joints intact. IMPRESSION: No evidence of ankylosing spondylitis in the lumbar spine Disc and facet degeneration L5-S1.  Activities of Daily Living:  Patient reports morning stiffness for 24 hours.   Patient Reports nocturnal pain.  Difficulty dressing/grooming: Reports Difficulty climbing stairs: Reports Difficulty getting out of chair: Reports Difficulty using hands for taps, buttons, cutlery, and/or writing: Reports  Review of Systems  Constitutional:  Positive for fatigue.  HENT:  Positive for mouth dryness. Negative for mouth sores and nose dryness.   Eyes:  Positive for pain, itching and visual disturbance. Negative for dryness.  Respiratory:  Positive for shortness of breath and difficulty breathing. Negative for cough  and hemoptysis.   Cardiovascular:  Positive for chest pain. Negative for palpitations and swelling in legs/feet.  Gastrointestinal:  Positive for diarrhea. Negative for abdominal pain, blood in stool and constipation.  Endocrine: Negative for increased urination.  Genitourinary:  Negative for painful urination.   Musculoskeletal:  Positive for joint pain, joint pain, joint swelling, myalgias, muscle weakness, morning stiffness, muscle tenderness and myalgias.  Skin:  Positive for pallor, rash and redness. Negative for color change.  Allergic/Immunologic: Negative for susceptible to infections.  Neurological:  Positive for numbness, headaches, parasthesias and weakness. Negative for dizziness and memory loss.  Hematological:  Negative for swollen glands.  Psychiatric/Behavioral:  Positive for sleep disturbance. Negative for confusion.    PMFS History:  Patient Active Problem List   Diagnosis Date Noted   Colon cancer screening 06/10/2021   Right leg pain 05/15/2021   Encounter for hepatitis C screening test for low risk patient 05/15/2021   Severe episode of recurrent major depressive disorder, without psychotic features ([place]) 09/23/2019   High risk medication use 02/08/2018   Healthcare maintenance 05/02/2016   Microalbuminuria 04/14/2016   Encounter for chronic pain management 04/10/2016   OCD (obsessive compulsive disorder) 04/10/2016   Vitamin D deficiency 02/26/2016   Bipolar 1 disorder, mixed, moderate ([place]) 02/22/2016   Migraines 02/22/2016   Osteoarthritis of both knees 02/22/2016   Controlled substance agreement signed 12/13/2015   Morbid obesity ([place]) 08/08/2015   HTN (hypertension) 08/08/2015   Diabetes mellitus type 2 in obese ([place]) 08/65/7846   Metabolic syndrome 96/29/5284   Hypertriglyceridemia 08/08/2015   Hypercholesteremia 08/08/2015   Ankylosing spondylitis ([place]) 08/08/2015   S/P gastric bypass 08/06/2015   Pain in joint of right knee 07/08/2014    Past Medical History:  Diagnosis Date   Rheumatic fever age 77    Family History  Problem Relation Age of Onset   Hyperlipidemia Mother    Hypertension Mother    Breast cancer Mother    Thyroid disease Father    Early death Father 57       Cancer   Diabetes Father    Autoimmune disease Father        Ankylosing  Spondylitis   Squamous cell carcinoma Father    Diabetes Brother    Hyperlipidemia Brother    Hypertension Brother    Thyroid disease Brother    Alcohol abuse Brother    Heart disease Maternal Grandmother    Stroke Maternal Grandmother    Heart disease Maternal Grandfather    Autoimmune disease Paternal Grandfather    Past Surgical History:  Procedure Laterality Date   arthroscopic surgery Left years ago   both knee cartlidge surgery     GASTRIC ROUX-EN-Y N/A 08/06/2015   Procedure: LAPAROSCOPIC ROUX-EN-Y GASTRIC BYPASS WITH UPPER ENDOSCOPY;  Surgeon: Greer Pickerel, MD;  Location: [facility];  Service: General;  Laterality: N/A;   HIP SURGERY Left 1986   with knee surgery   KNEE SURGERY Right 1986   benign tumor with bone graft    UPPER GI ENDOSCOPY  08/06/2015   Procedure: UPPER GI ENDOSCOPY;  Surgeon: Greer Pickerel, MD;  Location: [facility];  Service: General;;   Social History   Social History Narrative   02/22/16   Married: [name]   Children: [name] (anxiety/depression), [name] (Autism, non-verbal), [name]   Education: PhD in [employer]   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   Immunization History  Administered Date(s) Administered   Influenza,inj,Quad PF,6+ Mos 08/08/2015, 08/15/2016, 11/12/2017   Influenza,inj,quad,  With Preservative 07/30/2013, 09/05/2014   Influenza-Unspecified 09/11/2011, 08/08/2015, 08/15/2016, 11/12/2017   PFIZER Comirnaty(Gray Top)Covid-19 Tri-Sucrose Vaccine 05/15/2021, 05/17/2021   PFIZER(Purple Top)SARS-COV-2 Vaccination 07/20/2020   PPD Test 01/03/2013   Pneumococcal Polysaccharide-23 10/15/2010, 04/30/2017   Tdap 10/13/2004, 10/15/2010, 05/02/2016     Objective: Vital Signs: BP 131/90 (BP Location: Right Arm, Patient Position: Sitting, Cuff Size: Large)   Pulse 85   Ht 5' 10.5" (1.791 m)   Wt 275 lb (124.7 kg)   BMI 38.90 kg/m    Physical Exam Constitutional:      Appearance: He is obese.  HENT:      Mouth/Throat:     Mouth: Mucous membranes are moist.     Pharynx: Oropharynx is clear.  Eyes:     Conjunctiva/sclera: Conjunctivae normal.  Cardiovascular:     Rate and Rhythm: Normal rate and regular rhythm.  Pulmonary:     Effort: Pulmonary effort is normal.     Breath sounds: Normal breath sounds.  Musculoskeletal:     Right lower leg: No edema.     Left lower leg: No edema.  Skin:    General: Skin is warm and dry.     Findings: No rash.  Neurological:     Mental Status: He is alert.  Psychiatric:        Mood and Affect: Mood normal.     Musculoskeletal Exam:  Neck stiffness decreased rightward lateral rotation Shoulders full ROM with stiffness palpable muscle knot or bundle in right upper trapezius Elbows full ROM no tenderness or swelling Wrists full ROM, some tenderness to pressure without palpable synovitis Fingers full ROM no tenderness or swelling Bilateral upper back paraspinal muscle tenderness to palpation, lumbosacral right-sided tenderness and over the right SI joint Lumbar spine excursion range of motion appears normal Hip passive internal and external range of motion normal, pace maneuver provokes pain with decreased range of movement, Faber range of motion intact does provoke low back pain on the right side Knees with no palpable effusions and intact range of motion Ankles full ROM no tenderness or swelling   Investigation: No additional findings.  Imaging: No results found.  Recent Labs: Lab Results  Component Value Date   WBC 10.2 12/15/2019   HGB 17.7 12/15/2019   PLT 342 12/15/2019   NA 144 05/15/2021   K 4.5 05/15/2021   CL 103 05/15/2021   CO2 25 05/15/2021   GLUCOSE 158 (H) 05/15/2021   BUN 12 05/15/2021   CREATININE 0.88 05/15/2021   BILITOT 0.4 05/15/2021   ALKPHOS 102 05/15/2021   AST 14 05/15/2021   ALT 6 05/15/2021   PROT 6.3 05/15/2021   ALBUMIN 3.8 (L) 05/15/2021   CALCIUM 9.4 05/15/2021   GFRAA 108 12/15/2019     Speciality Comments: No specialty comments available.  Procedures:  No procedures performed Allergies: Demerol [meperidine], Nsaids, and Tapentadol   Assessment / Plan:     Visit Diagnoses: Ankylosing spondylitis of multiple sites in spine ([place]) - Plan: Sedimentation rate, C-reactive protein, MR SACRUM SI JOINTS WO CONTRAST, MR LUMBAR SPINE WO CONTRAST  Long history of ankylosing spondylitis with a few prior treatments currently just on NSAIDs and tramadol and is about to start some physical therapy.  Is difficult to assess degree of current disease activity and the more recent x-ray report being negative is unusual.  We will check sedimentation rate and CRP today we will recommend for MRI study of the pelvis and lumbar spine for a better look also for any bone  marrow edema consistent with a nonradiographic disease.  Recommend he can continue the meloxicam and tramadol for now.  We discussed possible alternative treatments to try such as Jak inhibitor and will obtain baseline labs for this today.  Drug therapy - Plan: CBC with Differential/Platelet, Hepatitis B core antibody, IgM, Hepatitis B surface antigen, QuantiFERON-TB Gold Plus  Discussed possible DMARD treatment options particular Jak inhibitors he had some recent labs in August but we will also check CBC and baseline hepatitis B and tuberculosis screening.  Primary osteoarthritis of both knees  Knee pain seems more consistent with osteoarthritis than inflammation with no swelling on exam and pain worsened with use with previous injuries, mild OA changes on the recent x-ray.  If inflammatory work-up would consider approaching as degenerative arthritis starting new DMARDs may wait to assess response to this treatment.  Orders: Orders Placed This Encounter  Procedures   MR SACRUM SI JOINTS WO CONTRAST   MR LUMBAR SPINE WO CONTRAST   CBC with Differential/Platelet   Hepatitis B core antibody, IgM   Hepatitis B surface antigen    QuantiFERON-TB Gold Plus   Sedimentation rate   C-reactive protein    No orders of the defined types were placed in this encounter.    Follow-Up Instructions: No follow-ups on file.   Collier Salina, MD  Note - This record has been created using [employer].  Chart creation errors have been sought, but may not always  have been located. Such creation errors do not reflect on  the standard of medical care.

## 2021-07-02 ENCOUNTER — Other Ambulatory Visit: Payer: Self-pay

## 2021-07-02 ENCOUNTER — Ambulatory Visit: Payer: No Typology Code available for payment source | Admitting: Internal Medicine

## 2021-07-02 ENCOUNTER — Encounter: Payer: Self-pay | Admitting: Internal Medicine

## 2021-07-02 VITALS — BP 131/90 | HR 85 | Ht 70.5 in | Wt 275.0 lb

## 2021-07-02 DIAGNOSIS — M450 Ankylosing spondylitis of multiple sites in spine: Secondary | ICD-10-CM

## 2021-07-02 DIAGNOSIS — M170 Bilateral primary osteoarthritis of knee: Secondary | ICD-10-CM

## 2021-07-02 DIAGNOSIS — Z79899 Other long term (current) drug therapy: Secondary | ICD-10-CM

## 2021-07-03 ENCOUNTER — Ambulatory Visit: Payer: No Typology Code available for payment source | Attending: Family Medicine | Admitting: Physical Therapy

## 2021-07-03 ENCOUNTER — Encounter: Payer: Self-pay | Admitting: Physical Therapy

## 2021-07-03 ENCOUNTER — Other Ambulatory Visit: Payer: Self-pay

## 2021-07-03 DIAGNOSIS — M545 Low back pain, unspecified: Secondary | ICD-10-CM | POA: Insufficient documentation

## 2021-07-03 DIAGNOSIS — G8929 Other chronic pain: Secondary | ICD-10-CM | POA: Insufficient documentation

## 2021-07-03 DIAGNOSIS — M6281 Muscle weakness (generalized): Secondary | ICD-10-CM | POA: Diagnosis present

## 2021-07-03 NOTE — Patient Instructions (Signed)
Access Code: [email or web address] URL: [email or web address] Date: 07/03/2021 Prepared by: Rosana Hoes  Exercises Supine Bridge - 1 x daily - 5-7 x weekly - 2 sets - 10 reps Active Straight Leg Raise with Quad Set - 1 x daily - 5-7 x weekly - 2 sets - 10 reps Supine Piriformis Stretch with Foot on Ground - 1 x daily - 5-7 x weekly - 3 reps - 30 hold Supine Lower Trunk Rotation - 1 x daily - 5-7 x weekly - 5 reps - 10 hold Bird Dog - 1 x daily - 5-7 x weekly - 2 sets - 10 reps - 5 hold Sidelying Hip Abduction - 1 x daily - 5-7 x weekly - 2 sets - 10 reps Sit to Stand Without Arm Support - 1 x daily - 5-7 x weekly - 2 sets - 10 reps Standing Row with Anchored Resistance - 1 x daily - 5-7 x weekly - 2 sets - 10 reps

## 2021-07-03 NOTE — Therapy (Signed)
[facility] Outpatient Rehabilitation [facility] [address] [place], [place], [address] Phone: [phone]   Fax:  [phone]  Physical Therapy Evaluation  Patient Details  Name: Edward [name] MRN: [id number] Date of Birth: May 16, 1971 Referring Provider (PT): Moses Manners, MD   Encounter Date: 07/03/2021   PT End of Session - 07/03/21 1012     Visit Number 1    Number of Visits 8    Date for PT Re-Evaluation 08/28/21    Authorization Type MEDCOST    Authorization Time Period FOTO by 6th    PT Start Time 1025    PT Stop Time 1110    PT Time Calculation (min) 45 min    Activity Tolerance Patient tolerated treatment well    Behavior During Therapy [facility] for tasks assessed/performed             Past Medical History:  Diagnosis Date   Rheumatic fever age 12    Past Surgical History:  Procedure Laterality Date   arthroscopic surgery Left years ago   both knee cartlidge surgery     GASTRIC ROUX-EN-Y N/A 08/06/2015   Procedure: LAPAROSCOPIC ROUX-EN-Y GASTRIC BYPASS WITH UPPER ENDOSCOPY;  Surgeon: Gaynelle Adu, MD;  Location: [facility];  Service: General;  Laterality: N/A;   HIP SURGERY Left 1986   with knee surgery   KNEE SURGERY Right 1986   benign tumor with bone graft    UPPER GI ENDOSCOPY  08/06/2015   Procedure: UPPER GI ENDOSCOPY;  Surgeon: Gaynelle Adu, MD;  Location: [facility];  Service: General;;    There were no vitals filed for this visit.    Subjective Assessment - 07/03/21 1030     Subjective Patient report he has ankylosing spondylitis which causes chronic pain of the back every day. He reports that pain can exacerbate other injuries such as right knee pain which he has multiple surgeries in the past. Patient also notes episodic right leg neuropathy pain when the ankylosing spondylitis is flared up. Patient also reports the strength of his quads and hamstrings has been affected which really affects his ability to negotiate steps. Patient  reports that he does stretch about every day. Patient reports that he has noticed he tires much more quickly with activity, and his flares can last much longer now.    Limitations Sitting;Lifting;Standing;Walking;House hold activities    How long can you stand comfortably? Standing longer than 5 minutes increases pain    Diagnostic tests X-ray, upcoming MRI    Patient Stated Goals Get legs and back strronger to improve posture and mobility including stairs    Currently in Pain? Yes    Pain Score 7     Pain Location Back    Pain Orientation Lower    Pain Descriptors / Indicators Aching;Sharp;Sore;Tightness    Pain Type Chronic pain    Pain Radiating Towards can move into cervical region and include shoulders, majority is lumbar and SIJ region with right>left leg pain    Pain Onset More than a month ago    Pain Frequency Constant    Aggravating Factors  General movement and extended periods of activity, sleeping, turning wrong    Pain Relieving Factors Medication, stretching                [facility] PT Assessment - 07/03/21 0001       Assessment   Medical Diagnosis Ankylosing spondylitis of multiple sites in spine    Referring Provider (PT) Leveda Anna, Santiago Bumpers, MD  Onset Date/Surgical Date --   ongoing for years   Prior Therapy None      Precautions   Precautions None      Restrictions   Weight Bearing Restrictions No      Balance Screen   Has the patient fallen in the past 6 months Yes    How many times? 3-4, occurs when turn or step the wrong way will affect right leg    Has the patient had a decrease in activity level because of a fear of falling?  No    Is the patient reluctant to leave their home because of a fear of falling?  No      Prior Function   Level of Independence Independent    Vocation Retired      Copy Status Within Functional Limits for tasks assessed      Observation/Other Assessments   Observations Patient appears in no  apparent distress    Focus on Therapeutic Outcomes (FOTO)  45% functional status      Sensation   Light Touch Appears Intact      Coordination   Gross Motor Movements are Fluid and Coordinated Yes      Posture/Postural Control   Posture Comments Patient exhibits rounded shoulder posture      ROM / Strength   AROM / PROM / Strength AROM;Strength      AROM   Overall AROM Comments Right lower back pain noted with all movement    AROM Assessment Site Lumbar    Lumbar Flexion 75%    Lumbar Extension 50%    Lumbar - Right Side Bend 75%    Lumbar - Left Side Bend 75%    Lumbar - Right Rotation 75%    Lumbar - Left Rotation 75%      Strength   Overall Strength Comments Core and periscapular strength grossly 4-/5 MMT    Strength Assessment Site Hip;Knee    Right/Left Hip Right;Left    Right Hip Flexion 4-/5    Right Hip Extension 4-/5    Right Hip ABduction 4-/5    Left Hip Flexion 4/5    Left Hip Extension 4-/5    Left Hip ABduction 4-/5    Right/Left Knee Right;Left    Right Knee Flexion 4-/5    Right Knee Extension 4-/5    Left Knee Flexion 4/5    Left Knee Extension 4/5      Flexibility   Soft Tissue Assessment /Muscle Length yes    Hamstrings Slightly limited    Piriformis Slightly limited      Palpation   Spinal mobility Not assessed    SI assessment  Not assessed    Palpation comment Generalized low back tenderness      Transfers   Transfers Independent with all Transfers                        Objective measurements completed on examination: See above findings.       [facility] Adult PT Treatment/Exercise - 07/03/21 0001       Exercises   Exercises Lumbar      Lumbar Exercises: Stretches   Lower Trunk Rotation 5 reps;10 seconds    Piriformis Stretch 2 reps;20 seconds    Other Lumbar Stretch Exercise Sidelying thoracic rotation 5 x 10 sec      Lumbar Exercises: Standing   Row 10 reps    Theraband Level (Row) Level 3 (Green)  Lumbar Exercises: Seated   Sit to Stand 10 reps      Lumbar Exercises: Supine   Bridge 10 reps    Straight Leg Raise 10 reps      Lumbar Exercises: Sidelying   Hip Abduction 10 reps      Lumbar Exercises: Quadruped   Opposite Arm/Leg Raise 10 reps;5 seconds                     PT Education - 07/03/21 1012     Education Details Exam findings, POC, HEP    Person(s) Educated Patient    Methods Explanation;Demonstration;Tactile cues;Verbal cues;Handout    Comprehension Verbalized understanding;Returned demonstration;Verbal cues required;Tactile cues required;Need further instruction              PT Short Term Goals - 07/03/21 1013       PT SHORT TERM GOAL #1   Title Patient will be I with initial HEP to progress with PT    Time 4    Period Weeks    Status New    Target Date 07/31/21      PT SHORT TERM GOAL #2   Title PT will review FOTO with patient by 3rd visit    Time 3    Period Weeks    Status New    Target Date 07/24/21      PT SHORT TERM GOAL #3   Title Patient will report </= 6/10 pain with household tasks to reduce functional limitation    Time 4    Period Weeks    Status New    Target Date 07/31/21               PT Long Term Goals - 07/03/21 1013       PT LONG TERM GOAL #1   Title Patient will be I with final HEP to maintain progress from PT    Time 8    Period Weeks    Status New    Target Date 08/28/21      PT LONG TERM GOAL #2   Title Patient will report >/= 54% functional status on FOTO to indicate improve functional status    Time 8    Period Weeks    Status New    Target Date 08/28/21      PT LONG TERM GOAL #3   Title Patient will exhibit improve core and periscapular strength to >/= 4+/5 MMT in order to improve postural control and prevent progression of flexed posturing    Time 8    Period Weeks    Status New    Target Date 08/28/21      PT LONG TERM GOAL #4   Title Patient will exhibit improved knee strength  to 5/5 MMT in order to improve stair negotiation without pain or limitation    Time 8    Period Weeks    Status New    Target Date 08/28/21      PT LONG TERM GOAL #5   Title Patient will improve standing tolerance to >/= 30 minutes with </= 4/10 pain in order to improve hosuehold tasks without limitation    Time 8    Period Weeks    Status New    Target Date 08/28/21                    Plan - 07/03/21 1041     Clinical Impression Statement Patient presents to PT with report of chronic low  back pain as a result of ankylosing spondylitis. He also experiences pain R > L LE and knee, as well as cervical pain and shoulder pain depending of flare up of symptoms. Currently majority of pain located to lumbar spine and right leg. He demonstrates postural deficits with decreased periscapualr and core strength for postural control, decreased strength of BLEs and hips that have resulted in previous falls, limitation in lumbar/thoracic mobility and general flexibility. Patient was provided exercises to address impairments and he would benefit from continued skilled PT to progress his mobility and strength in order to reduce pain, prevent progression of ankylosing spondylitis symptoms, maintain proper posture, and maximize functional ability.    Personal Factors and Comorbidities Fitness;Time since onset of injury/illness/exacerbation;Past/Current Experience;Comorbidity 3+    Comorbidities Anylosing spondylitis, BMI, DM II, Bipolar disorder, HTN, chronic right knee pain    Examination-Activity Limitations Locomotion Level;Stand;Lift;Stairs;Squat;Carry;Sleep    Examination-Participation Restrictions Cleaning;Occupation;Community Activity;Driving;Shop;Laundry;Yard Work    Conservation officer, historic buildings Evolving/Moderate complexity    Clinical Decision Making Moderate    Rehab Potential Good    PT Frequency 1x / week    PT Duration 8 weeks    PT Treatment/Interventions ADLs/Self Care Home  Management;Aquatic Therapy;Cryotherapy;Electrical Stimulation;Iontophoresis 4mg /ml Dexamethasone;Moist Heat;Traction;Ultrasound;Neuromuscular re-education;Balance training;Therapeutic exercise;Therapeutic activities;Functional mobility training;Stair training;Gait training;Patient/family education;Manual techniques;Dry needling;Passive range of motion;Taping;Vasopneumatic Device;Spinal Manipulations;Joint Manipulations    PT Next Visit Plan Review HEP and progress PRN, lumbar mobility and general LE stretching for flexibility, progress LE and posterior chain strengthening for postural control    PT Home Exercise Plan [email or web address]    Consulted and Agree with Plan of Care Patient             Patient will benefit from skilled therapeutic intervention in order to improve the following deficits and impairments:  Decreased range of motion, Postural dysfunction, Decreased strength, Impaired flexibility, Decreased activity tolerance, Pain  Visit Diagnosis: Chronic bilateral low back pain, unspecified whether sciatica present  Muscle weakness (generalized)     Problem List Patient Active Problem List   Diagnosis Date Noted   Colon cancer screening 06/10/2021   Right leg pain 05/15/2021   Encounter for hepatitis C screening test for low risk patient 05/15/2021   Severe episode of recurrent major depressive disorder, without psychotic features (HCC) 09/23/2019   High risk medication use 02/08/2018   Healthcare maintenance 05/02/2016   Microalbuminuria 04/14/2016   Encounter for chronic pain management 04/10/2016   OCD (obsessive compulsive disorder) 04/10/2016   Vitamin D deficiency 02/26/2016   Bipolar 1 disorder, mixed, moderate (HCC) 02/22/2016   Migraines 02/22/2016   Osteoarthritis of both knees 02/22/2016   Controlled substance agreement signed 12/13/2015   Morbid obesity (HCC) 08/08/2015   HTN (hypertension) 08/08/2015   Diabetes mellitus type 2 in obese (HCC) 08/08/2015   Metabolic  syndrome 08/08/2015   Hypertriglyceridemia 08/08/2015   Hypercholesteremia 08/08/2015   Ankylosing spondylitis (HCC) 08/08/2015   S/P gastric bypass 08/06/2015   Pain in joint of right knee 07/08/2014    07/10/2014, PT, DPT, LAT, ATC 07/03/21  12:38 PM Phone: [phone] Fax: [phone]   [facility] Outpatient Rehabilitation [facility] [address] [place], [place], [place] Phone: [phone]   Fax:  [phone]  Name: Edward [name] MRN: Carmelina Dane Date of Birth: 01-04-1971

## 2021-07-05 LAB — CBC WITH DIFFERENTIAL/PLATELET
Absolute Monocytes: 216 cells/uL (ref 200–950)
Basophils Absolute: 84 cells/uL (ref 0–200)
Basophils Relative: 0.7 %
Eosinophils Absolute: 84 cells/uL (ref 15–500)
Eosinophils Relative: 0.7 %
HCT: 54.8 % — ABNORMAL HIGH (ref 38.5–50.0)
Hemoglobin: 17.3 g/dL — ABNORMAL HIGH (ref 13.2–17.1)
Lymphs Abs: 1272 cells/uL (ref 850–3900)
MCH: 27.6 pg (ref 27.0–33.0)
MCHC: 31.6 g/dL — ABNORMAL LOW (ref 32.0–36.0)
MCV: 87.5 fL (ref 80.0–100.0)
MPV: 10.5 fL (ref 7.5–12.5)
Monocytes Relative: 1.8 %
Neutro Abs: 10344 cells/uL — ABNORMAL HIGH (ref 1500–7800)
Neutrophils Relative %: 86.2 %
Platelets: 342 10*3/uL (ref 140–400)
RBC: 6.26 10*6/uL — ABNORMAL HIGH (ref 4.20–5.80)
RDW: 15.4 % — ABNORMAL HIGH (ref 11.0–15.0)
Total Lymphocyte: 10.6 %
WBC: 12 10*3/uL — ABNORMAL HIGH (ref 3.8–10.8)

## 2021-07-05 LAB — C-REACTIVE PROTEIN: CRP: 8 mg/L — ABNORMAL HIGH (ref <8.0)

## 2021-07-05 LAB — HEPATITIS B CORE ANTIBODY, IGM: Hep B C IgM: NONREACTIVE

## 2021-07-05 LAB — QUANTIFERON-TB GOLD PLUS
Mitogen-NIL: 1.24 IU/mL
NIL: 0.02 IU/mL
QuantiFERON-TB Gold Plus: NEGATIVE
TB1-NIL: 0 IU/mL
TB2-NIL: 0 IU/mL

## 2021-07-05 LAB — SEDIMENTATION RATE: Sed Rate: 19 mm/h — ABNORMAL HIGH (ref 0–15)

## 2021-07-05 LAB — HEPATITIS B SURFACE ANTIGEN: Hepatitis B Surface Ag: NONREACTIVE

## 2021-07-11 ENCOUNTER — Ambulatory Visit: Payer: No Typology Code available for payment source | Admitting: Physical Therapy

## 2021-07-16 ENCOUNTER — Encounter: Payer: Self-pay | Admitting: Physical Therapy

## 2021-07-16 ENCOUNTER — Other Ambulatory Visit: Payer: Self-pay

## 2021-07-16 ENCOUNTER — Ambulatory Visit: Payer: No Typology Code available for payment source | Attending: Family Medicine | Admitting: Physical Therapy

## 2021-07-16 DIAGNOSIS — G8929 Other chronic pain: Secondary | ICD-10-CM | POA: Insufficient documentation

## 2021-07-16 DIAGNOSIS — M6281 Muscle weakness (generalized): Secondary | ICD-10-CM | POA: Diagnosis present

## 2021-07-16 DIAGNOSIS — M545 Low back pain, unspecified: Secondary | ICD-10-CM | POA: Insufficient documentation

## 2021-07-16 NOTE — Patient Instructions (Signed)
Access Code: [email or web address] URL: [email or web address] Date: 07/16/2021 Prepared by: Rosana Hoes  Exercises Supine Bridge - 1 x daily - 5-7 x weekly - 2 sets - 10 reps Active Straight Leg Raise with Quad Set - 1 x daily - 5-7 x weekly - 2 sets - 10 reps Supine Piriformis Stretch with Foot on Ground - 1 x daily - 5-7 x weekly - 3 reps - 30 hold Supine Lower Trunk Rotation - 1 x daily - 5-7 x weekly - 5 reps - 10 hold Supine 90/90 Alternating Toe Touch One Leg at a Time - 1 x daily - 5-7 x weekly - 2 sets - 5 reps Bird Dog - 1 x daily - 5-7 x weekly - 2 sets - 10 reps - 5 hold Sidelying Hip Abduction - 1 x daily - 5-7 x weekly - 2 sets - 10 reps Sit to Stand Without Arm Support - 1 x daily - 5-7 x weekly - 2 sets - 10 reps Standing Row with Anchored Resistance - 1 x daily - 5-7 x weekly - 2 sets - 10 reps Standing Anti-Rotation Press with Anchored Resistance - 1 x daily - 5-7 x weekly - 2 sets - 10 reps - 5 hold

## 2021-07-16 NOTE — Therapy (Signed)
[facility] Outpatient Rehabilitation [facility] [address] [place], [place], [address] Phone: [phone]   Fax:  [phone]  Physical Therapy Treatment  Patient Details  Name: Edward [name] MRN: [id number] Date of Birth: 25-Jan-1971 Referring Provider (PT): Moses Manners, MD   Encounter Date: 07/16/2021   PT End of Session - 07/16/21 0923     Visit Number 2    Number of Visits 8    Date for PT Re-Evaluation 08/28/21    Authorization Type MEDCOST    Authorization Time Period FOTO by 6th    PT Start Time 0915    PT Stop Time 1000    PT Time Calculation (min) 45 min    Activity Tolerance Patient tolerated treatment well    Behavior During Therapy [facility] for tasks assessed/performed             Past Medical History:  Diagnosis Date   Rheumatic fever age 72    Past Surgical History:  Procedure Laterality Date   arthroscopic surgery Left years ago   both knee cartlidge surgery     GASTRIC ROUX-EN-Y N/A 08/06/2015   Procedure: LAPAROSCOPIC ROUX-EN-Y GASTRIC BYPASS WITH UPPER ENDOSCOPY;  Surgeon: Gaynelle Adu, MD;  Location: [facility];  Service: General;  Laterality: N/A;   HIP SURGERY Left 1986   with knee surgery   KNEE SURGERY Right 1986   benign tumor with bone graft    UPPER GI ENDOSCOPY  08/06/2015   Procedure: UPPER GI ENDOSCOPY;  Surgeon: Gaynelle Adu, MD;  Location: [facility];  Service: General;;    There were no vitals filed for this visit.   Subjective Assessment - 07/16/21 0915     Subjective Patient reports he is doing much better today compared to evaluation. He does continue to have some pain and he sort of rolled his ankle wearing a flip flop inside and pulled his muscle on the right hamstring area.    Patient Stated Goals Get legs and back strronger to improve posture and mobility including stairs    Currently in Pain? Yes    Pain Score 4     Pain Location Back    Pain Orientation Lower    Pain Descriptors / Indicators  Aching;Sharp;Sore;Tightness    Pain Type Chronic pain    Pain Onset More than a month ago    Pain Frequency Constant    Aggravating Factors  General movement and extended periods of activity, sleeping, turning wrong                [facility] PT Assessment - 07/16/21 0001       Strength   Right Hip ABduction 4-/5    Left Hip ABduction 4-/5    Right Knee Extension 4-/5    Left Knee Extension 4/5                         [facility] Adult PT Treatment/Exercise:  Therapeutic Exercise: - NuStep x 5 min LE only while taking subjective - LTR 5 x 5" hold - Bridge 2 x 10 with 5" hold  - 90-90 alternating heel tap 2 x 5 - SLR 2 x 10 each - Sidelying hip abduction 2 x 10 each - Sit<>stand 2 x 10 - Bird dog 2 x 5 with 5" hold - Row with green 2 x 20 - Pallof press with green 2 x 10 with 5" hold  Manual Therapy: - NA  Neuromuscular re-ed: - NA  Therapeutic Activity: -  NA  Modalities: - NA            PT Education - 07/16/21 0923     Education Details HEP update    Person(s) Educated Patient    Methods Explanation;Demonstration;Verbal cues;Handout    Comprehension Verbalized understanding;Returned demonstration;Verbal cues required;Need further instruction              PT Short Term Goals - 07/03/21 1013       PT SHORT TERM GOAL #1   Title Patient will be I with initial HEP to progress with PT    Time 4    Period Weeks    Status New    Target Date 07/31/21      PT SHORT TERM GOAL #2   Title PT will review FOTO with patient by 3rd visit    Time 3    Period Weeks    Status New    Target Date 07/24/21      PT SHORT TERM GOAL #3   Title Patient will report </= 6/10 pain with household tasks to reduce functional limitation    Time 4    Period Weeks    Status New    Target Date 07/31/21               PT Long Term Goals - 07/03/21 1013       PT LONG TERM GOAL #1   Title Patient will be I with final HEP to maintain progress from  PT    Time 8    Period Weeks    Status New    Target Date 08/28/21      PT LONG TERM GOAL #2   Title Patient will report >/= 54% functional status on FOTO to indicate improve functional status    Time 8    Period Weeks    Status New    Target Date 08/28/21      PT LONG TERM GOAL #3   Title Patient will exhibit improve core and periscapular strength to >/= 4+/5 MMT in order to improve postural control and prevent progression of flexed posturing    Time 8    Period Weeks    Status New    Target Date 08/28/21      PT LONG TERM GOAL #4   Title Patient will exhibit improved knee strength to 5/5 MMT in order to improve stair negotiation without pain or limitation    Time 8    Period Weeks    Status New    Target Date 08/28/21      PT LONG TERM GOAL #5   Title Patient will improve standing tolerance to >/= 30 minutes with </= 4/10 pain in order to improve hosuehold tasks without limitation    Time 8    Period Weeks    Status New    Target Date 08/28/21                   Plan - 07/16/21 0917     Clinical Impression Statement Patient tolerated therapy well with no adverse effects. Therapy focused on progression of core and quad strengthening with good tolerance. Patient does require consistent cueing for exercise technique and he exhibits great dfficulty/strength deficit of RLE with exercises.Updated HEP to further progress strengthening. He would benefit from continued skilled PT to progress his mobility and strength in order to reduce pain, prevent progression of ankylosing spondylitis symptoms, maintain proper posture, and maximize functional ability.    PT Treatment/Interventions ADLs/Self  Care Home Management;Aquatic Therapy;Cryotherapy;Electrical Stimulation;Iontophoresis 4mg /ml Dexamethasone;Moist Heat;Traction;Ultrasound;Neuromuscular re-education;Balance training;Therapeutic exercise;Therapeutic activities;Functional mobility training;Stair training;Gait  training;Patient/family education;Manual techniques;Dry needling;Passive range of motion;Taping;Vasopneumatic Device;Spinal Manipulations;Joint Manipulations    PT Next Visit Plan Review HEP and progress PRN, lumbar mobility and general LE stretching for flexibility, progress LE and posterior chain strengthening for postural control    PT Home Exercise Plan [email or web address]    Consulted and Agree with Plan of Care Patient             Patient will benefit from skilled therapeutic intervention in order to improve the following deficits and impairments:  Decreased range of motion, Postural dysfunction, Decreased strength, Impaired flexibility, Decreased activity tolerance, Pain  Visit Diagnosis: Chronic bilateral low back pain, unspecified whether sciatica present  Muscle weakness (generalized)     Problem List Patient Active Problem List   Diagnosis Date Noted   Colon cancer screening 06/10/2021   Right leg pain 05/15/2021   Encounter for hepatitis C screening test for low risk patient 05/15/2021   Severe episode of recurrent major depressive disorder, without psychotic features (HCC) 09/23/2019   High risk medication use 02/08/2018   Healthcare maintenance 05/02/2016   Microalbuminuria 04/14/2016   Encounter for chronic pain management 04/10/2016   OCD (obsessive compulsive disorder) 04/10/2016   Vitamin D deficiency 02/26/2016   Bipolar 1 disorder, mixed, moderate (HCC) 02/22/2016   Migraines 02/22/2016   Osteoarthritis of both knees 02/22/2016   Controlled substance agreement signed 12/13/2015   Morbid obesity (HCC) 08/08/2015   HTN (hypertension) 08/08/2015   Diabetes mellitus type 2 in obese (HCC) 08/08/2015   Metabolic syndrome 08/08/2015   Hypertriglyceridemia 08/08/2015   Hypercholesteremia 08/08/2015   Ankylosing spondylitis (HCC) 08/08/2015   S/P gastric bypass 08/06/2015   Pain in joint of right knee 07/08/2014    07/10/2014, PT, DPT, LAT, ATC 07/16/21  10:04  AM Phone: [phone] Fax: [phone]   [facility] Outpatient Rehabilitation [facility] [address] [place], [place], [place] Phone: [phone]   Fax:  [phone]  Name: Edward [name] MRN: Carmelina Dane Date of Birth: 1970-12-31

## 2021-07-18 ENCOUNTER — Encounter: Payer: Self-pay | Admitting: Family Medicine

## 2021-07-18 DIAGNOSIS — E1169 Type 2 diabetes mellitus with other specified complication: Secondary | ICD-10-CM

## 2021-07-18 DIAGNOSIS — E669 Obesity, unspecified: Secondary | ICD-10-CM

## 2021-07-22 MED ORDER — EMPAGLIFLOZIN 25 MG PO TABS: 25.0000 mg | ORAL_TABLET | Freq: Every day | ORAL | 3 refills | Status: DC

## 2021-07-22 MED ORDER — METFORMIN HCL ER 500 MG PO TB24: 1000.0000 mg | ORAL_TABLET | Freq: Two times a day (BID) | ORAL | 3 refills | Status: DC

## 2021-07-23 ENCOUNTER — Ambulatory Visit: Payer: No Typology Code available for payment source | Admitting: Physical Therapy

## 2021-07-23 ENCOUNTER — Ambulatory Visit
Admission: RE | Admit: 2021-07-23 | Discharge: 2021-07-23 | Disposition: A | Discharge status: Home / Self Care | Payer: No Typology Code available for payment source | Source: Ambulatory Visit | Attending: Internal Medicine | Admitting: Internal Medicine

## 2021-07-23 DIAGNOSIS — M450 Ankylosing spondylitis of multiple sites in spine: Secondary | ICD-10-CM

## 2021-07-25 ENCOUNTER — Encounter: Payer: Self-pay | Admitting: Internal Medicine

## 2021-07-26 NOTE — Telephone Encounter (Signed)
Patient has been scheduled for a follow up appointment on 08/02/2021.

## 2021-07-26 NOTE — Telephone Encounter (Signed)
I was able to review these results as well, please schedule a follow up appointment with Edward [name] to go over this also his lab results and discuss treatment plan.

## 2021-07-30 ENCOUNTER — Ambulatory Visit: Payer: No Typology Code available for payment source | Admitting: Physical Therapy

## 2021-07-30 ENCOUNTER — Other Ambulatory Visit: Payer: Self-pay

## 2021-07-30 DIAGNOSIS — M545 Low back pain, unspecified: Secondary | ICD-10-CM

## 2021-07-30 DIAGNOSIS — M6281 Muscle weakness (generalized): Secondary | ICD-10-CM

## 2021-07-30 NOTE — Therapy (Signed)
[facility] [address] [place], [place], [address] Phone: [phone]   Fax:  [phone]  Physical Therapy Treatment  Patient Details  Name: Edward [name] MRN: [id number] Date of Birth: 01-03-1971 Referring Provider (PT): Moses Manners, MD   Encounter Date: 07/30/2021   PT End of Session - 07/30/21 0922     Visit Number 3    Number of Visits 8    Date for PT Re-Evaluation 08/28/21    Authorization Type MEDCOST    Authorization Time Period FOTO by 6th    PT Start Time 0915    PT Stop Time 1000    PT Time Calculation (min) 45 min    Activity Tolerance Patient tolerated treatment well    Behavior During Therapy [facility] for tasks assessed/performed             Past Medical History:  Diagnosis Date   Rheumatic fever age 11    Past Surgical History:  Procedure Laterality Date   arthroscopic surgery Left years ago   both knee cartlidge surgery     GASTRIC ROUX-EN-Y N/A 08/06/2015   Procedure: LAPAROSCOPIC ROUX-EN-Y GASTRIC BYPASS WITH UPPER ENDOSCOPY;  Surgeon: Gaynelle Adu, MD;  Location: [facility];  Service: General;  Laterality: N/A;   HIP SURGERY Left 1986   with knee surgery   KNEE SURGERY Right 1986   benign tumor with bone graft    UPPER GI ENDOSCOPY  08/06/2015   Procedure: UPPER GI ENDOSCOPY;  Surgeon: Gaynelle Adu, MD;  Location: [facility];  Service: General;;    There were no vitals filed for this visit.   Subjective Assessment - 07/30/21 0916     Subjective Patient reports he is doing alright. He had an MRI last week and will review the results this Friday (08/02/2021). States the cooler weather causes increased achiness and stiffness of the back. He also reports a recent fall when he tripped due to weakness of right lower leg and dragging the right foot while wearing flip flops. States he landed right on the middle of his back so he is sore.    Patient Stated Goals Get legs and back strronger to improve  posture and mobility including stairs    Currently in Pain? Yes    Pain Score 6    5-6/10   Pain Location Back    Pain Orientation Lower    Pain Descriptors / Indicators Aching;Sharp;Sore;Tightness    Pain Type Chronic pain    Pain Onset More than a month ago    Pain Frequency Constant    Aggravating Factors  General movement and extended periods of activity, sleeping, turning wrong                [facility] PT Assessment - 07/30/21 0001       Strength   Right Hip Flexion 4-/5    Right Hip Extension 4-/5    Right Hip ABduction 4-/5    Left Hip Flexion 4/5    Left Hip Extension 4/5    Left Hip ABduction 4/5    Right Knee Extension 4/5    Left Knee Extension 5/5                       [facility] Adult PT Treatment/Exercise:   Therapeutic Exercise: - NuStep x 6 min LE only while taking subjective - LTR 5 x 5" hold - Piriformis stretch 2 x 20" bilat - Bridge x 10 with 5" hold  -  SLR x 10 bilat - Sidelying hip abduction 2 x 10 bilat - Sidelying thoracic rotation 10 x 5 bilat - 90-90 alternating heel tap 2 x 5 bilat - Bird dog 2 x 5 with 5" hold bilat - Knee extension machine: single leg 15# 2 x 8 bilat   Manual Therapy: - NA   Neuromuscular re-ed: - NA   Therapeutic Activity: - NA   Modalities: - NA             PT Education - 07/30/21 0922     Education Details HEP    Person(s) Educated Patient    Methods Explanation;Demonstration;Verbal cues    Comprehension Verbalized understanding;Returned demonstration;Verbal cues required;Need further instruction              PT Short Term Goals - 07/30/21 0923       PT SHORT TERM GOAL #1   Title Patient will be I with initial HEP to progress with PT    Baseline progressing HEP    Time 4    Period Weeks    Status On-going    Target Date 07/31/21      PT SHORT TERM GOAL #2   Title PT will review FOTO with patient by 3rd visit    Baseline reviewed on 3rd visit    Time 3    Period Weeks     Status Achieved    Target Date 07/24/21      PT SHORT TERM GOAL #3   Title Patient will report </= 6/10 pain with household tasks to reduce functional limitation    Baseline patient reports 5-6/10 pain at rest, > 6/10 pain with activity    Time 4    Period Weeks    Status On-going    Target Date 07/31/21               PT Long Term Goals - 07/03/21 1013       PT LONG TERM GOAL #1   Title Patient will be I with final HEP to maintain progress from PT    Time 8    Period Weeks    Status New    Target Date 08/28/21      PT LONG TERM GOAL #2   Title Patient will report >/= 54% functional status on FOTO to indicate improve functional status    Time 8    Period Weeks    Status New    Target Date 08/28/21      PT LONG TERM GOAL #3   Title Patient will exhibit improve core and periscapular strength to >/= 4+/5 MMT in order to improve postural control and prevent progression of flexed posturing    Time 8    Period Weeks    Status New    Target Date 08/28/21      PT LONG TERM GOAL #4   Title Patient will exhibit improved knee strength to 5/5 MMT in order to improve stair negotiation without pain or limitation    Time 8    Period Weeks    Status New    Target Date 08/28/21      PT LONG TERM GOAL #5   Title Patient will improve standing tolerance to >/= 30 minutes with </= 4/10 pain in order to improve hosuehold tasks without limitation    Time 8    Period Weeks    Status New    Target Date 08/28/21  Plan - 07/30/21 0923     Clinical Impression Statement Patient tolerated therapy well with no adverse effects. Continued to progress spinal mobility and extension focus, and progressing core and hip strength with good tolerance. Patient seems to be progressing well with exercises and reports improvement in his symptoms at end of therapy. Patient does demonstrate greater strength deficit on right. He would benefit from continued skilled PT to  progress his mobility and strength in order to reduce pain, prevent progression of ankylosing spondylitis symptoms, maintain proper posture, and maximize functional ability.    PT Treatment/Interventions ADLs/Self Care Home Management;Aquatic Therapy;Cryotherapy;Electrical Stimulation;Iontophoresis 4mg /ml Dexamethasone;Moist Heat;Traction;Ultrasound;Neuromuscular re-education;Balance training;Therapeutic exercise;Therapeutic activities;Functional mobility training;Stair training;Gait training;Patient/family education;Manual techniques;Dry needling;Passive range of motion;Taping;Vasopneumatic Device;Spinal Manipulations;Joint Manipulations    PT Next Visit Plan Review HEP and progress PRN, lumbar mobility and general LE stretching for flexibility, progress LE and posterior chain strengthening for postural control    PT Home Exercise Plan [id number]    Consulted and Agree with Plan of Care Patient             Patient will benefit from skilled therapeutic intervention in order to improve the following deficits and impairments:  Decreased range of motion, Postural dysfunction, Decreased strength, Impaired flexibility, Decreased activity tolerance, Pain  Visit Diagnosis: Chronic bilateral low back pain, unspecified whether sciatica present  Muscle weakness (generalized)     Problem List Patient Active Problem List   Diagnosis Date Noted   Colon cancer screening 06/10/2021   Right leg pain 05/15/2021   Encounter for hepatitis C screening test for low risk patient 05/15/2021   Severe episode of recurrent major depressive disorder, without psychotic features (HCC) 09/23/2019   High risk medication use 02/08/2018   Healthcare maintenance 05/02/2016   Microalbuminuria 04/14/2016   Encounter for chronic pain management 04/10/2016   OCD (obsessive compulsive disorder) 04/10/2016   Vitamin D deficiency 02/26/2016   Bipolar 1 disorder, mixed, moderate (HCC) 02/22/2016   Migraines 02/22/2016    Osteoarthritis of both knees 02/22/2016   Controlled substance agreement signed 12/13/2015   Morbid obesity (HCC) 08/08/2015   HTN (hypertension) 08/08/2015   Diabetes mellitus type 2 in obese (HCC) 08/08/2015   Metabolic syndrome 08/08/2015   Hypertriglyceridemia 08/08/2015   Hypercholesteremia 08/08/2015   Ankylosing spondylitis (HCC) 08/08/2015   S/P gastric bypass 08/06/2015   Pain in joint of right knee 07/08/2014    07/10/2014, PT, DPT, LAT, ATC 07/30/21  10:01 AM Phone: [phone] Fax: [phone]   [facility] [address] [place], [place], [place] Phone: [phone]   Fax:  [phone]  Name: Edward [name] MRN: Carmelina Dane Date of Birth: 12-12-1970

## 2021-08-01 NOTE — Progress Notes (Deleted)
Office Visit Note  Patient: Edward [name]             Date of Birth: 1971/08/03           MRN: [id number]             PCP: Zenia Resides, MD Referring: Zenia Resides, MD Visit Date: 08/02/2021   Subjective:  No chief complaint on file.   History of Present Illness: Edward [name] is a 50 y.o. male here for follow up for multiple joint pains and history of ankylosing spondylitis. MRI was obtained 10/11 showing L5-S1 degenerative changes no active lumbar spine disease. Pelvic joint imaging showing chronic sacroiliitis changes without erosion, fusion, or active inflammatory changes. ***   Previous HPI 07/02/21 Edward [name] is a 50 y.o. male here for back pain and joint pains with history of ankylosing spondylitis.  He has a longstanding disease history with original diagnosis in late teenage and adult years.  Treatments have primarily consisted of chronic long-term use of oral NSAIDs switching between several different medications over time.  He has also had prior knee surgeries with problems from right worse than left osteoarthritis arthroscopy for injuries related to football and benign bone mass excision.  About 6 years ago was seen in rheumatology and recommended trial of DMARD treatment he took Humira and Enbrel.  He developed a significant pneumonia within 1 year of each medication and discontinued these.  He never felt a huge improvement on either medicine.  He also continues to have intermittent exacerbations and development of impingement and radicular type pain down 1 extremity or another usually 1 side at a time and lasting for up to a few months.  At present he has more trouble on the right side in both upper and lower back and going down around the lateral hip.  He is having some symptom improvement with oral meloxicam and tramadol prescribed through his PCP office at low-dose has been a good benefit. He saw Dr. Gerilyn Nestle in 2019 but apparently not much follow up or ability to  continue treatment at least in part due to change in insurance status. Interestingly his most recent x-ray studies did not report significant sacroiliitis changes in the lumbar spine and pelvis.  Prior imaging from May 2016 in [place] system was reported consistent with inflammatory changes.   Labs reviewed Previous labs HLA-B27 pos RF neg CCP neg HCV neg    No Rheumatology ROS completed.   PMFS History:  Patient Active Problem List   Diagnosis Date Noted   Colon cancer screening 06/10/2021   Right leg pain 05/15/2021   Encounter for hepatitis C screening test for low risk patient 05/15/2021   Severe episode of recurrent major depressive disorder, without psychotic features ([place]) 09/23/2019   High risk medication use 02/08/2018   Healthcare maintenance 05/02/2016   Microalbuminuria 04/14/2016   Encounter for chronic pain management 04/10/2016   OCD (obsessive compulsive disorder) 04/10/2016   Vitamin D deficiency 02/26/2016   Bipolar 1 disorder, mixed, moderate ([place]) 02/22/2016   Migraines 02/22/2016   Osteoarthritis of both knees 02/22/2016   Controlled substance agreement signed 12/13/2015   Morbid obesity ([place]) 08/08/2015   HTN (hypertension) 08/08/2015   Diabetes mellitus type 2 in obese ([place]) 56/21/3086   Metabolic syndrome 57/84/6962   Hypertriglyceridemia 08/08/2015   Hypercholesteremia 08/08/2015   Ankylosing spondylitis ([place]) 08/08/2015   S/P gastric bypass 08/06/2015   Pain in joint of right knee 07/08/2014    Past Medical History:  Diagnosis Date   Rheumatic fever age 14    Family History  Problem Relation Age of Onset   Hyperlipidemia Mother    Hypertension Mother    Breast cancer Mother    Thyroid disease Father    Early death Father 18       Cancer   Diabetes Father    Autoimmune disease Father        Ankylosing Spondylitis   Squamous cell carcinoma Father    Diabetes Brother    Hyperlipidemia Brother    Hypertension Brother     Thyroid disease Brother    Alcohol abuse Brother    Heart disease Maternal Grandmother    Stroke Maternal Grandmother    Heart disease Maternal Grandfather    Autoimmune disease Paternal Grandfather    Past Surgical History:  Procedure Laterality Date   arthroscopic surgery Left years ago   both knee cartlidge surgery     GASTRIC ROUX-EN-Y N/A 08/06/2015   Procedure: LAPAROSCOPIC ROUX-EN-Y GASTRIC BYPASS WITH UPPER ENDOSCOPY;  Surgeon: Greer Pickerel, MD;  Location: [facility];  Service: General;  Laterality: N/A;   HIP SURGERY Left 1986   with knee surgery   KNEE SURGERY Right 1986   benign tumor with bone graft    UPPER GI ENDOSCOPY  08/06/2015   Procedure: UPPER GI ENDOSCOPY;  Surgeon: Greer Pickerel, MD;  Location: [facility];  Service: General;;   Social History   Social History Narrative   02/22/16   Married: [name]   Children: [name] (anxiety/depression), [name] (Autism, non-verbal), [name]   Education: PhD in [employer]   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   Physical Exam   Musculoskeletal Exam: ***  CDAI Exam: CDAI Score: -- Patient Global: --; Provider Global: -- Swollen: --; Tender: -- Joint Exam 08/02/2021   No joint exam has been documented for this visit   There is currently no information documented on the homunculus. Go to the Rheumatology activity and  complete the homunculus joint exam.  Investigation: No additional findings.  Imaging: MR LUMBAR SPINE WO CONTRAST  Result Date: 07/24/2021 CLINICAL DATA:  Ankylosing spondylitis. Worsening back pain over the last 2 years. EXAM: MRI LUMBAR SPINE WITHOUT CONTRAST TECHNIQUE: Multiplanar, multisequence MR imaging of the lumbar spine was performed. No intravenous contrast was administered. COMPARISON:  Radiography 05/15/2021 FINDINGS: Segmentation:  5 lumbar type vertebral bodies. Alignment: Very minimal curvature convex to the right. No antero or retrolisthesis. Vertebrae: No fracture or focal bone lesion. No edematous vertebral body corners. Chronic endplate marrow fatty changes at L5-S1. Conus medullaris and cauda equina: Conus extends to the L1 level. Conus and cauda equina appear normal. Paraspinal and other soft tissues: Negative Disc levels: No abnormality from T11-12 through L2-3. L3-4: Normal appearance of the disc. Minimal facet osteoarthritis. No stenosis. L4-5: Desiccation of the disc with a shallow protrusion slightly more prominent towards the right. Mild stenosis of the right lateral recess but without definite neural compression. Minimal facet osteoarthritis. L5-S1: Chronic disc degeneration with loss of disc height, endplate osteophytes and bulging  of the disc. No compressive narrowing of the canal. Mild bilateral foraminal narrowing without apparent compression of the exiting L5 nerves. Minimal facet osteoarthritis. IMPRESSION: No advanced finding. No MR finding diagnostic of ankylosing spondylitis in the lumbar region. Shallow disc protrusion at L4-5 more prominent towards the right. Mild stenosis of the right lateral recess but no definite neural compression. The right L5 nerve could possibly be irritated. Chronic disc degeneration at L5-S1. Endplate osteophytes and bulging of the disc. No compressive canal or lateral recess stenosis. Mild bilateral foraminal narrowing but without visible  compression of the exiting L5 nerves. Mild facet osteoarthritis at L3-4, L4-5 and L5-S1. Electronically Signed   By: Nelson Chimes M.D.   On: 07/24/2021 15:14   MR SACRUM SI JOINTS WO CONTRAST  Result Date: 07/25/2021 CLINICAL DATA:  ankylosing spoindylitis EXAM: MRI LUMBAR SPINE WITHOUT CONTRAST TECHNIQUE: Multiplanar, multisequence MR imaging of the lumbar spine was performed. No intravenous contrast was administered. COMPARISON:  None. FINDINGS: Bones/Joint/Cartilage There is asymmetric periarticular fat deposition along the sacroiliac joints, right greater than left. Additionally, there is subsolid chondral sclerosis along the anterior aspects of the sacroiliac joints, right greater than left. There is no subchondral bony edema. No evidence of active osseous erosion or inflammatory change. There is no SI joint effusion. There is no evidence of acute fracture. There is disc bulging at L5-S1, see separately dictated lumbar spine MRI. Ligaments Intact. Muscles and Tendons No muscle edema or atrophy. Soft tissue No focal fluid collection. Partially visualized pelvic structures are unremarkable. IMPRESSION: Findings which can be seen in chronic sacroiliitis. No bony edema to suggest active inflammation. Electronically Signed   By: Maurine Simmering M.D.   On: 07/25/2021 13:15    Recent Labs: Lab Results  Component Value Date   WBC 12.0 (H) 07/02/2021   HGB 17.3 (H) 07/02/2021   PLT 342 07/02/2021   NA 144 05/15/2021   K 4.5 05/15/2021   CL 103 05/15/2021   CO2 25 05/15/2021   GLUCOSE 158 (H) 05/15/2021   BUN 12 05/15/2021   CREATININE 0.88 05/15/2021   BILITOT 0.4 05/15/2021   ALKPHOS 102 05/15/2021   AST 14 05/15/2021   ALT 6 05/15/2021   PROT 6.3 05/15/2021   ALBUMIN 3.8 (L) 05/15/2021   CALCIUM 9.4 05/15/2021   GFRAA 108 12/15/2019   QFTBGOLDPLUS NEGATIVE 07/02/2021    Speciality Comments: No specialty comments available.  Procedures:  No procedures performed Allergies: Demerol  [meperidine], Nsaids, and Tapentadol   Assessment / Plan:     Visit Diagnoses: No diagnosis found.  ***  Orders: No orders of the defined types were placed in this encounter.  No orders of the defined types were placed in this encounter.    Follow-Up Instructions: No follow-ups on file.   Collier Salina, MD  Note - This record has been created using [employer].  Chart creation errors have been sought, but may not always  have been located. Such creation errors do not reflect on  the standard of medical care.

## 2021-08-02 ENCOUNTER — Ambulatory Visit: Payer: No Typology Code available for payment source | Admitting: Internal Medicine

## 2021-08-06 ENCOUNTER — Ambulatory Visit: Payer: No Typology Code available for payment source | Admitting: Physical Therapy

## 2021-08-06 ENCOUNTER — Encounter: Payer: Self-pay | Admitting: Physical Therapy

## 2021-08-06 ENCOUNTER — Other Ambulatory Visit: Payer: Self-pay

## 2021-08-06 DIAGNOSIS — M545 Low back pain, unspecified: Secondary | ICD-10-CM | POA: Diagnosis not present

## 2021-08-06 DIAGNOSIS — G8929 Other chronic pain: Secondary | ICD-10-CM

## 2021-08-06 DIAGNOSIS — M6281 Muscle weakness (generalized): Secondary | ICD-10-CM

## 2021-08-06 NOTE — Patient Instructions (Signed)

## 2021-08-06 NOTE — Therapy (Addendum)
[place], [place], [address] Phone: [phone]   Fax:  [phone]  Physical Therapy Treatment / Discharge  Patient Details  Name: Edward [name] MRN: [id number] Date of Birth: 1971/01/03 Referring Provider (PT): Zenia Resides, MD   Encounter Date: 08/06/2021   PT End of Session - 08/06/21 0921     Visit Number 4    Number of Visits 8    Date for PT Re-Evaluation 08/28/21    Authorization Type MEDCOST    Authorization Time Period FOTO by 6th    PT Start Time 0915    PT Stop Time 1000   5 minutes of dry needling performed   PT Time Calculation (min) 45 min    Activity Tolerance Patient tolerated treatment well    Behavior During Therapy [facility] for tasks assessed/performed             Past Medical History:  Diagnosis Date   Rheumatic fever age 61    Past Surgical History:  Procedure Laterality Date   arthroscopic surgery Left years ago   both knee cartlidge surgery     GASTRIC ROUX-EN-Y N/A 08/06/2015   Procedure: LAPAROSCOPIC ROUX-EN-Y GASTRIC BYPASS WITH UPPER ENDOSCOPY;  Surgeon: Greer Pickerel, MD;  Location: [facility];  Service: General;  Laterality: N/A;   [place]   with knee surgery   KNEE SURGERY Right 1986   benign tumor with bone graft    UPPER GI ENDOSCOPY  08/06/2015   Procedure: UPPER GI ENDOSCOPY;  Surgeon: Greer Pickerel, MD;  Location: [facility];  Service: General;;    There were no vitals filed for this visit.   Subjective Assessment - 08/06/21 0918     Subjective Patient reports he is doing well with no new issues. States that everything from neck down is tight and right hip and knee are bothering him. He reports that he feels he is putting more pressure on the right knee which causes increased pain.    Patient Stated Goals Get legs and back stronger to improve posture and mobility including stairs    Currently in Pain? Yes    Pain Score 5     Pain Location Back     Pain Orientation Lower;Right    Pain Descriptors / Indicators Aching;Tightness;Sore    Pain Type Chronic pain    Pain Onset More than a month ago    Pain Frequency Constant    Aggravating Factors  General movement and extended periods of activity, sleeping, turning wrong    Multiple Pain Sites Yes    Pain Score 7    Pain Location Knee    Pain Orientation Right    Pain Descriptors / Indicators Aching;Sharp    Pain Type Chronic pain    Pain Onset More than a month ago    Pain Frequency Constant    Aggravating Factors  Any standing or walking, bending the knee                [facility] PT Assessment - 08/06/21 0001       Strength   Right Hip ABduction 4-/5                       [facility] Adult PT Treatment/Exercise:  Therapeutic Exercise: NuStep L5 x 5 min LE only while taking subjective LTR 5 x 5" hold Sidelying thoracic rotation x 10 each Piriformis stretch 2 x 20" bilat Leg press (omega): SL 35# 2  x 10 each Knee extension machine: single leg 15# 2 x 8 bilat Bridge with green band 2 x 10 x 3" hold  Lateral band walk with green at knees 3 x 8  Manual Therapy: Skilled palpation and monitoring during TPDN for right gluteal and lumbar  STM right glute med, piriformis, lumbar paraspinals  Neuromuscular re-ed: N/A  Therapeutic Activity: N/A  Modalities: N/A  Trigger Point Dry Needling Treatment: Pre-treatment instruction: Patient instructed on dry needling rationale, procedures, and possible side effects including pain during treatment (achy,cramping feeling), bruising, drop of blood, lightheadedness, nausea, sweating. Patient Consent Given: Yes Education handout provided: Yes Muscles treated: Right glute med, piriformis, lumbar multifidus at level L5 and L4 Needle size and number: .30x112m x 3 Electrical stimulation performed: No Parameters: N/A Treatment response/outcome: Twitch response elicited, Palpable decrease in muscle tension, and patient  report improvement in symptoms Post-treatment instructions: Patient instructed to expect possible mild to moderate muscle soreness later today and/or tomorrow. Patient instructed in methods to reduce muscle soreness and to continue prescribed HEP. If patient was dry needled over the lung field, patient was instructed on signs and symptoms of pneumothorax and, however unlikely, to see immediate medical attention should they occur. Patient was also educated on signs and symptoms of infection and to seek medical attention should they occur. Patient verbalized understanding of these instructions and education.             PT Education - 08/06/21 0921     Education Details HEP, dry needling    Person(s) Educated Patient    Methods Explanation;Demonstration;Verbal cues;Handout    Comprehension Verbalized understanding;Returned demonstration;Verbal cues required;Need further instruction              PT Short Term Goals - 07/30/21 0923       PT SHORT TERM GOAL #1   Title Patient will be I with initial HEP to progress with PT    Baseline progressing HEP    Time 4    Period Weeks    Status On-going    Target Date 07/31/21      PT SHORT TERM GOAL #2   Title PT will review FOTO with patient by 3rd visit    Baseline reviewed on 3rd visit    Time 3    Period Weeks    Status Achieved    Target Date 07/24/21      PT SHORT TERM GOAL #3   Title Patient will report </= 6/10 pain with household tasks to reduce functional limitation    Baseline patient reports 5-6/10 pain at rest, > 6/10 pain with activity    Time 4    Period Weeks    Status On-going    Target Date 07/31/21               PT Long Term Goals - 07/03/21 1013       PT LONG TERM GOAL #1   Title Patient will be I with final HEP to maintain progress from PT    Time 8    Period Weeks    Status New    Target Date 08/28/21      PT LONG TERM GOAL #2   Title Patient will report >/= 54% functional status on FOTO  to indicate improve functional status    Time 8    Period Weeks    Status New    Target Date 08/28/21      PT LONG TERM GOAL #3   Title Patient will  exhibit improve core and periscapular strength to >/= 4+/5 MMT in order to improve postural control and prevent progression of flexed posturing    Time 8    Period Weeks    Status New    Target Date 08/28/21      PT LONG TERM GOAL #4   Title Patient will exhibit improved knee strength to 5/5 MMT in order to improve stair negotiation without pain or limitation    Time 8    Period Weeks    Status New    Target Date 08/28/21      PT LONG TERM GOAL #5   Title Patient will improve standing tolerance to >/= 30 minutes with </= 4/10 pain in order to improve hosuehold tasks without limitation    Time 8    Period Weeks    Status New    Target Date 08/28/21                   Plan - 08/06/21 1610     Clinical Impression Statement Patient tolerated therapy well with no adverse effects. Trialed use of TPDN this visit for right gluteal, piriformis, and lumabr paraspinal region. Patient reported slight improvement in symptoms and was able to continue with exercise without report of increased hip or lumbar pain. He notes majority of limitations at present are due to right knee pain. Therapy continues to focus on porgressing mobility and core/hip strength. He would benefit from continued skilled PT to progress his mobility and strength in order to reduce pain, prevent progression of ankylosing spondylitis symptoms, maintain proper posture, and maximize functional ability.    PT Treatment/Interventions ADLs/Self Care Home Management;Aquatic Therapy;Cryotherapy;Electrical Stimulation;Iontophoresis 95m/ml Dexamethasone;Moist Heat;Traction;Ultrasound;Neuromuscular re-education;Balance training;Therapeutic exercise;Therapeutic activities;Functional mobility training;Stair training;Gait training;Patient/family education;Manual techniques;Dry  needling;Passive range of motion;Taping;Vasopneumatic Device;Spinal Manipulations;Joint Manipulations    PT Next Visit Plan Review HEP and progress PRN, lumbar mobility and general LE stretching for flexibility, progress LE and posterior chain strengthening for postural control    PT Home Exercise Plan [id number]    Consulted and Agree with Plan of Care Patient             Patient will benefit from skilled therapeutic intervention in order to improve the following deficits and impairments:  Decreased range of motion, Postural dysfunction, Decreased strength, Impaired flexibility, Decreased activity tolerance, Pain  Visit Diagnosis: Chronic bilateral low back pain, unspecified whether sciatica present  Muscle weakness (generalized)     Problem List Patient Active Problem List   Diagnosis Date Noted   Colon cancer screening 06/10/2021   Right leg pain 05/15/2021   Encounter for hepatitis C screening test for low risk patient 05/15/2021   Severe episode of recurrent major depressive disorder, without psychotic features ([place] 09/23/2019   High risk medication use 02/08/2018   Healthcare maintenance 05/02/2016   Microalbuminuria 04/14/2016   Encounter for chronic pain management 04/10/2016   OCD (obsessive compulsive disorder) 04/10/2016   Vitamin D deficiency 02/26/2016   Bipolar 1 disorder, mixed, moderate ([place] 02/22/2016   Migraines 02/22/2016   Osteoarthritis of both knees 02/22/2016   Controlled substance agreement signed 12/13/2015   Morbid obesity ([place] 08/08/2015   HTN (hypertension) 08/08/2015   Diabetes mellitus type 2 in obese ([place] 196/01/5408  Metabolic syndrome 181/19/1478  Hypertriglyceridemia 08/08/2015   Hypercholesteremia 08/08/2015   Ankylosing spondylitis ([place] 08/08/2015   S/P gastric bypass 08/06/2015   Pain in joint of right knee 07/08/2014    CHilda Blades PT, DPT, LAT, ATC 08/06/21  10:11 AM Phone: [phone] Fax: [place] [facility] [address] [place], [place], [address] Phone: [phone]   Fax:  [phone]  Name: Edward [name] MRN: [id number] Date of Birth: [birth year]   PHYSICAL THERAPY DISCHARGE SUMMARY  Visits from Start of Care: 4  Current functional level related to goals / functional outcomes: See above   Remaining deficits: See above   Education / Equipment: HEP   Patient agrees to discharge. Patient goals were not met. Patient is being discharged due to not returning since the last visit.

## 2021-08-21 NOTE — Progress Notes (Signed)
Office Visit Note  Patient: Edward [name]             Date of Birth: Apr 23, 1971           MRN: [id number]             PCP: Zenia Resides, MD Referring: Zenia Resides, MD Visit Date: 08/22/2021   Subjective:  Other (Bilateral hand pain/swelling/stiffness. Patient reports right is worse than left. )   History of Present Illness: Edward [name] is a 50 y.o. male here for follow up for chronic joint pains in multiple sites with history of ankylosing spondylitis currently on NSAIDs. After our last visit findings were inconclusive so went for lumbar and sacral MRI. He continues having similar chronic low back pain and stiffness especially in the morning and at night. He also reports some worsening in hand pain and stiffness, sometimes extends up to the right elbow and shoulder.  New Imaging reviewed from 07/23/21 MRI Sacrum FINDINGS: Bones/Joint/Cartilage There is asymmetric periarticular fat deposition along the sacroiliac joints, right greater than left. Additionally, there is subsolid chondral sclerosis along the anterior aspects of the sacroiliac joints, right greater than left. There is no subchondral bony edema. No evidence of active osseous erosion or inflammatory change. There is no SI joint effusion. There is no evidence of acute fracture. There is disc bulging at L5-S1, see separately dictated lumbar spine MRI. IMPRESSION: Findings which can be seen in chronic sacroiliitis. No bony edema to suggest active inflammation.  MRI Lumbar spine IMPRESSION: No advanced finding. No MR finding diagnostic of ankylosing spondylitis in the lumbar region. Shallow disc protrusion at L4-5 more prominent towards the right. Mild stenosis of the right lateral recess but no definite neural compression. The right L5 nerve could possibly be irritated. Chronic disc degeneration at L5-S1. Endplate osteophytes and bulging of the disc. No compressive canal or lateral recess stenosis. Mild bilateral  foraminal narrowing but without visible compression of the exiting L5 nerves. Mild facet osteoarthritis at L3-4, L4-5 and L5-S1.  Previous HPI 07/02/21 Edward [name] is a 50 y.o. male here for back pain and joint pains with history of ankylosing spondylitis.  He has a longstanding disease history with original diagnosis in late teenage and adult years.  Treatments have primarily consisted of chronic long-term use of oral NSAIDs switching between several different medications over time.  He has also had prior knee surgeries with problems from right worse than left osteoarthritis arthroscopy for injuries related to football and benign bone mass excision.  About 6 years ago was seen in rheumatology and recommended trial of DMARD treatment he took Humira and Enbrel.  He developed a significant pneumonia within 1 year of each medication and discontinued these.  He never felt a huge improvement on either medicine.  He also continues to have intermittent exacerbations and development of impingement and radicular type pain down 1 extremity or another usually 1 side at a time and lasting for up to a few months.  At present he has more trouble on the right side in both upper and lower back and going down around the lateral hip.  He is having some symptom improvement with oral meloxicam and tramadol prescribed through his PCP office at low-dose has been a good benefit. He saw Dr. Gerilyn Nestle in 2019 but apparently not much follow up or ability to continue treatment at least in part due to change in insurance status. Interestingly his most recent x-ray studies did not report significant sacroiliitis changes in  the lumbar spine and pelvis.  Prior imaging from May 2016 in [facility] was reported consistent with inflammatory changes.   Labs HLA-B27 pos RF neg CCP neg   05/2021 HCV neg     Review of Systems  Constitutional:  Positive for fatigue.  HENT:  Negative for mouth sores, mouth dryness and  nose dryness.   Eyes:  Negative for pain, itching and dryness.  Respiratory:  Negative for shortness of breath and difficulty breathing.   Cardiovascular:  Negative for chest pain and palpitations.  Gastrointestinal:  Negative for blood in stool, constipation and diarrhea.  Endocrine: Negative for increased urination.  Genitourinary:  Negative for difficulty urinating.  Musculoskeletal:  Positive for joint pain, joint pain, joint swelling, myalgias, morning stiffness, muscle tenderness and myalgias.  Skin:  Negative for color change, rash and redness.  Allergic/Immunologic: Negative for susceptible to infections.  Neurological:  Positive for numbness and weakness. Negative for dizziness, headaches and memory loss.  Hematological:  Positive for bruising/bleeding tendency.  Psychiatric/Behavioral:  Negative for confusion.    PMFS History:  Patient Active Problem List   Diagnosis Date Noted   Colon cancer screening 06/10/2021   Right leg pain 05/15/2021   Encounter for hepatitis C screening test for low risk patient 05/15/2021   Severe episode of recurrent major depressive disorder, without psychotic features ([place]) 09/23/2019   High risk medication use 02/08/2018   Healthcare maintenance 05/02/2016   Microalbuminuria 04/14/2016   Encounter for chronic pain management 04/10/2016   OCD (obsessive compulsive disorder) 04/10/2016   Vitamin D deficiency 02/26/2016   Bipolar 1 disorder, mixed, moderate ([place]) 02/22/2016   Migraines 02/22/2016   Osteoarthritis of both knees 02/22/2016   Controlled substance agreement signed 12/13/2015   Morbid obesity ([place]) 08/08/2015   HTN (hypertension) 08/08/2015   Diabetes mellitus type 2 in obese ([place]) 99/24/2683   Metabolic syndrome 41/96/2229   Hypertriglyceridemia 08/08/2015   Hypercholesteremia 08/08/2015   Ankylosing spondylitis ([place]) 08/08/2015   S/P gastric bypass 08/06/2015   Pain in joint of right knee 07/08/2014    Past Medical History:   Diagnosis Date   Rheumatic fever age 37    Family History  Problem Relation Age of Onset   Hyperlipidemia Mother    Hypertension Mother    Breast cancer Mother    Thyroid disease Father    Early death Father 59       Cancer   Diabetes Father    Autoimmune disease Father        Ankylosing Spondylitis   Squamous cell carcinoma Father    Diabetes Brother    Hyperlipidemia Brother    Hypertension Brother    Thyroid disease Brother    Alcohol abuse Brother    Heart disease Maternal Grandmother    Stroke Maternal Grandmother    Heart disease Maternal Grandfather    Autoimmune disease Paternal Grandfather    Past Surgical History:  Procedure Laterality Date   arthroscopic surgery Left years ago   both knee cartlidge surgery     GASTRIC ROUX-EN-Y N/A 08/06/2015   Procedure: LAPAROSCOPIC ROUX-EN-Y GASTRIC BYPASS WITH UPPER ENDOSCOPY;  Surgeon: Greer Pickerel, MD;  Location: [facility];  Service: General;  Laterality: N/A;   HIP SURGERY Left 1986   with knee surgery   KNEE SURGERY Right 1986   benign tumor with bone graft    UPPER GI ENDOSCOPY  08/06/2015   Procedure: UPPER GI ENDOSCOPY;  Surgeon: Greer Pickerel, MD;  Location: [facility];  Service: General;;  Social History   Social History Narrative   02/22/16   Married: [name]   Children: [name] (anxiety/depression), [name] (Autism, non-verbal), [name]   Education: PhD in [employer]   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   Immunization History  Administered Date(s) Administered   Influenza,inj,Quad PF,6+ Mos 08/08/2015, 08/15/2016, 11/12/2017   Influenza,inj,quad, With Preservative 07/30/2013, 09/05/2014   Influenza-Unspecified 09/11/2011, 08/08/2015, 08/15/2016, 11/12/2017   PFIZER Comirnaty(Gray Top)Covid-19 Tri-Sucrose Vaccine 05/15/2021   PFIZER(Purple Top)SARS-COV-2 Vaccination 07/20/2020   PPD Test 01/03/2013   Pneumococcal Polysaccharide-23 10/15/2010, 04/30/2017   Tdap 10/13/2004, 10/15/2010,  05/02/2016     Objective: Vital Signs: BP 140/80 (BP Location: Left Arm, Patient Position: Sitting, Cuff Size: Normal)   Pulse 93   Ht 6' (1.829 m)   Wt 289 lb 3.2 oz (131.2 kg)   BMI 39.22 kg/m    Physical Exam Constitutional:      Appearance: He is obese.  Eyes:     Conjunctiva/sclera: Conjunctivae normal.  Skin:    General: Skin is warm and dry.     Findings: No rash.  Neurological:     Mental Status: He is alert.  Psychiatric:        Mood and Affect: Mood normal.     Musculoskeletal Exam:  Elbows full ROM no tenderness or swelling Wrists full ROM no tenderness or swelling Fingers full ROM no tenderness or swelling Knees full ROM no tenderness or swelling   Investigation: No additional findings.  Imaging: No results found.  Recent Labs: Lab Results  Component Value Date   WBC 12.0 (H) 07/02/2021   HGB 17.3 (H) 07/02/2021   PLT 342 07/02/2021   NA 144 05/15/2021   K 4.5 05/15/2021   CL 103 05/15/2021   CO2 25 05/15/2021   GLUCOSE 158 (H) 05/15/2021   BUN 12 05/15/2021   CREATININE 0.88 05/15/2021   BILITOT 0.4 05/15/2021   ALKPHOS 102 05/15/2021   AST 14 05/15/2021   ALT 6 05/15/2021   PROT 6.3 05/15/2021   ALBUMIN 3.8 (L) 05/15/2021   CALCIUM 9.4 05/15/2021   GFRAA 108 12/15/2019   QFTBGOLDPLUS NEGATIVE 07/02/2021    Speciality Comments: No specialty comments available.  Procedures:  No procedures performed Allergies: Demerol [meperidine], Nsaids, and Tapentadol   Assessment / Plan:     Visit Diagnoses: Ankylosing spondylitis of multiple sites in spine [facility])  We discussed MRI findings demonstrate very chronic inflammatory change in the SI joints but without joint erosions or fusion in SI joints or lumbar spine. Back pain is ongoing with morning and night pain also reporting peripheral joint symptoms. Inflammatory markers are mildly elevated. Due to intolerance of 2 TNF inhibitors recommend starting a JAK inhibitor as next line. Plan to start  tofacitinib 11 mg PO daily for active AS.  High risk medication use  Recent labs including CBC and CMP are normal hepatitis B and C and TB screening negative. We discussed risks of medication including cytopenias, hepatotoxicity, hyperlipidemia. Discussed possible increased risk for major cardiac events and blood clots. He has risk factors of obesity and hyperlipidemia. No history of blood clots, cardiovascular events, or diverticulitis.  Orders: No orders of the defined types were placed in this encounter.  No orders of the defined types were placed in this encounter.    Follow-Up Instructions: Return in about 6 weeks (around 10/03/2021) for AS new JAKi f/u 6wks.   Collier Salina, MD  Note - This record has been created using [employer].  Chart creation  errors have been sought, but may not always  have been located. Such creation errors do not reflect on  the standard of medical care.  

## 2021-08-22 ENCOUNTER — Encounter: Payer: Self-pay | Admitting: Internal Medicine

## 2021-08-22 ENCOUNTER — Other Ambulatory Visit: Payer: Self-pay

## 2021-08-22 ENCOUNTER — Ambulatory Visit (INDEPENDENT_AMBULATORY_CARE_PROVIDER_SITE_OTHER): Payer: No Typology Code available for payment source | Admitting: Internal Medicine

## 2021-08-22 VITALS — BP 140/80 | HR 93 | Ht 72.0 in | Wt 289.2 lb

## 2021-08-22 DIAGNOSIS — Z79899 Other long term (current) drug therapy: Secondary | ICD-10-CM | POA: Diagnosis not present

## 2021-08-22 DIAGNOSIS — M450 Ankylosing spondylitis of multiple sites in spine: Secondary | ICD-10-CM

## 2021-08-22 NOTE — Patient Instructions (Signed)
Tofacitinib Oral Tablets What is this medication? TOFACITINIB (TOE fa SYE ti nib) treats rheumatoid arthritis, psoriatic arthritis, and polyarticular juvenile idiopathic arthritis. It is also used to treat ankylosing spondylitis and ulcerative colitis. It works on the immune system. It belongs to a group of medicines called JAK inhibitors. This medicine may be used for other purposes; ask your health care provider or pharmacist if you have questions. COMMON BRAND NAME(S): Morrie Sheldon What should I tell my care team before I take this medication? They need to know if you have any of these conditions: blood clots cancer diabetes (high blood sugar) heart disease high blood pressure high cholesterol HIV or AIDS immune system problems infection especially a virus infection such as chickenpox, cold sores, hepatitis B or herpes infection such as tuberculosis (TB) or other bacterial, fungal or viral infections kidney disease liver disease low blood counts (white cells, platelets, or red blood cells) lung or breathing disease (asthma, COPD) organ transplant smoke tobacco cigarettes stomach or intestine problems an unusual or allergic reaction to tofacitinib, other medicines, foods, dyes, or preservatives pregnant or trying to get pregnant breast-feeding How should I use this medication? Take this medicine by mouth with water. Take it as directed on the prescription label. You can take it with or without food. If it upsets your stomach, take it with food. Keep taking it unless your health care provider tells you to stop. A special MedGuide will be given to you by the pharmacist with each prescription and refill. Be sure to read this information carefully each time. Talk to your health care provider about the use of this medicine in children. While this drug may be prescribed for children as young as 2 years for selected conditions, precautions do apply. Overdosage: If you think you have taken too  much of this medicine contact a poison control center or emergency room at once. NOTE: This medicine is only for you. Do not share this medicine with others. What if I miss a dose? If you miss a dose, take it as soon as you can. If it is almost time for your next dose, take only that dose. Do not take double or extra doses. What may interact with this medication? Do not take this medicine with any of the following medications: upadacitinib This medicine may also interact with the following medications: antiviral medicines for hepatitis, HIV or AIDS azathioprine biologic medicines such as abatacept, adalimumab, anakinra, certolizumab, etanercept, golimumab, infliximab, ofatumumab, rituximab, sarilumab, secukinumab, tocilizumab, ustekinumab, vedolizumab certain medicines for fungal infections like fluconazole, itraconazole, ketoconazole, voriconazole certain medicines for seizures like carbamazepine, phenobarbital, phenytoin cyclosporine live vaccines medicines that lower your chance of fighting infection rifampin supplements, such as St. John's wort tacrolimus This list may not describe all possible interactions. Give your health care provider a list of all the medicines, herbs, non-prescription drugs, or dietary supplements you use. Also tell them if you smoke, drink alcohol, or use illegal drugs. Some items may interact with your medicine. What should I watch for while using this medication? Visit your health care provider for regular checks on your progress. Tell your health care provider if your symptoms do not start to get better or if they get worse. You may need blood work while you are taking this medicine. This medicine may increase your risk of getting an infection. Call your health care provider for advice if you get a fever, chills, sore throat, or other symptoms of a cold or flu. Do not treat yourself. Try to avoid  being around people who are sick. Avoid taking medicines that  contain aspirin, acetaminophen, ibuprofen, naproxen, or ketoprofen unless instructed by your health care provider. These medicines may hide a fever. Talk to your health care provider about your risk of cancer. You may be more at risk for certain types of cancer if you take this medicine. Do not become pregnant while taking this medicine. Women should inform their health care provider if they wish to become pregnant or think they might be pregnant. There is potential for serious harm to an unborn child. Talk to your health care provider for more information. Do not breast-feed an infant while taking this medicine or for at least 18 hours after stopping it. What side effects may I notice from receiving this medication? Side effects that you should report to your doctor or health care professional as soon as possible: allergic reactions (skin rash, itching or hives; swelling of the face, lips, or tongue) blood clot (chest pain; shortness of breath; pain, swelling, or warmth in the leg) heart attack (trouble breathing; pain or tightness in the chest, neck, back or arms; unusually weak or tired) infection (fever, chills, cough, sore throat, pain or trouble passing urine) light colored stool liver injury (dark yellow or brown urine; general ill feeling or flu-like symptoms; loss of appetite, right upper belly pain; unusually weak or tired, yellowing of the eyes or skin) low red blood cell counts (trouble breathing; feeling faint; lightheaded, falls; unusually weak or tired) stroke (changes in vision; confusion; trouble speaking or understanding; severe headaches; sudden numbness or weakness of the face, arm or leg; trouble walking; dizziness; loss of balance or coordination) tears in the stomach or intestines (fever; stomach pain; sudden change in bowel habits) Side effects that usually do not require medical attention (report to your doctor or health care professional if they continue or are  bothersome): diarrhea headache muscle pain nasal congestion (runny or stuffy nose) This list may not describe all possible side effects. Call your doctor for medical advice about side effects. You may report side effects to FDA at 1-800-FDA-1088. Where should I keep my medication? Keep out of the reach of children and pets. Store at room temperature between 20 and 25 degrees C (68 and 77 degrees F). Get rid of any unused medicine after the expiration date. To get rid of medicines that are no longer needed or have expired: Take the medicine to a medicine take-back program. Check with your pharmacy or law enforcement to find a location. If you cannot return the medicine, check the label or package insert to see if the medicine should be thrown out in the garbage or flushed down the toilet. If you are not sure, ask your health care provider. If it is safe to put it in the trash, empty the medicine out of the container. Mix the medicine with cat litter, dirt, coffee grounds, or other unwanted substance. Seal the mixture in a bag or container. Put it in the trash. NOTE: This sheet is a summary. It may not cover all possible information. If you have questions about this medicine, talk to your doctor, pharmacist, or health care provider.  2022 Elsevier/Gold Standard (2021-06-18 00:00:00)

## 2021-08-26 ENCOUNTER — Other Ambulatory Visit (HOSPITAL_COMMUNITY): Payer: Self-pay

## 2021-08-26 ENCOUNTER — Telehealth: Payer: Self-pay | Admitting: Pharmacist

## 2021-08-26 DIAGNOSIS — M450 Ankylosing spondylitis of multiple sites in spine: Secondary | ICD-10-CM

## 2021-08-26 DIAGNOSIS — Z79899 Other long term (current) drug therapy: Secondary | ICD-10-CM

## 2021-08-26 NOTE — Telephone Encounter (Addendum)
Please start Xeljanz BIV.  Dose: 11mg  once daily  Dx: Ankylosing spondylitis (M45.0)  Previously tried therapies: Humira - recurrent infections Enbrel - recurrent infections  ----- Message from , MD sent at 08/22/2021  5:00 PM EST ----- Regarding: 13/07/2021 Edward [name] has ankylosing spondylitis currently active with axial and peripheral joint pain and elevated inflammatory labs. Previous treatment with Humira and Enbrel complicated by infections does not want to rechallenge. I recommend JAK inhibitor as next treatment option, specifically discussed xeljanz 11 mg PO daily in clinic.

## 2021-08-26 NOTE — Telephone Encounter (Signed)
Submitted a Prior Authorization request to [facility] for [facility] via CoverMyMeds. Will update once we receive a response.   Key: [id number]

## 2021-08-26 NOTE — Telephone Encounter (Signed)
Received notification from [facility] regarding a prior authorization for [facility]. Authorization has been APPROVED from 08/26/2021 to 08/26/2022.   Per test claim, patient must fill through [facility].  Authorization # [id number]   Patient enrolled into copay card program, but may need to call [phone] to activate card. No specific ID number was provided for card info.

## 2021-08-27 ENCOUNTER — Ambulatory Visit: Payer: No Typology Code available for payment source | Admitting: Physical Therapy

## 2021-08-27 ENCOUNTER — Encounter: Payer: Self-pay | Admitting: Physical Therapy

## 2021-08-29 MED ORDER — XELJANZ XR 11 MG PO TB24: 11.0000 mg | ORAL_TABLET | Freq: Every day | ORAL | 0 refills | Status: DC

## 2021-08-29 NOTE — Telephone Encounter (Signed)
Called patient to advise about approval. Per chart review, he was recently diagnosis with flu. Will need to wait until after recovered from flu to start [facility]. Rx for Edward [name] 11mg  once daily sent to [facility]. Unable to reach patient but left VM advising him to call [phone] to activate copay card and provide this information to [facility] pharmacy. MyChart message sent to patient to advise.  Standing lab orders placed for lipid panel (to be drawn 4-8 weeks after starting [facility]), CBC w Diff, and CMP w GFR.  Edward [name], PharmD, MPH, BCPS Clinical Pharmacist (Rheumatology and Pulmonology)

## 2021-09-03 ENCOUNTER — Ambulatory Visit: Payer: No Typology Code available for payment source | Attending: Family Medicine | Admitting: Physical Therapy

## 2021-09-04 ENCOUNTER — Telehealth: Payer: Self-pay | Admitting: Physical Therapy

## 2021-09-04 NOTE — Telephone Encounter (Signed)
Attempted to contact patient due to missed PT appointment. Left VM informing patient of missed appointment and reminding him of next scheduled appointment. Advised patient of attendance policy and to contact the clinic if he needs to reschedule or cancel future appointments.  Rosana Hoes, PT, DPT, LAT, ATC 09/04/21  10:36 AM Phone: [phone] Fax: [phone]

## 2021-09-09 ENCOUNTER — Encounter: Payer: Self-pay | Admitting: Family Medicine

## 2021-09-09 ENCOUNTER — Ambulatory Visit: Payer: No Typology Code available for payment source | Admitting: Family Medicine

## 2021-09-09 ENCOUNTER — Other Ambulatory Visit: Payer: Self-pay

## 2021-09-09 VITALS — BP 128/82 | HR 99 | Ht 72.0 in | Wt 277.0 lb

## 2021-09-09 DIAGNOSIS — N529 Male erectile dysfunction, unspecified: Secondary | ICD-10-CM

## 2021-09-09 DIAGNOSIS — F3162 Bipolar disorder, current episode mixed, moderate: Secondary | ICD-10-CM | POA: Diagnosis not present

## 2021-09-09 DIAGNOSIS — E1169 Type 2 diabetes mellitus with other specified complication: Secondary | ICD-10-CM

## 2021-09-09 DIAGNOSIS — E669 Obesity, unspecified: Secondary | ICD-10-CM | POA: Diagnosis not present

## 2021-09-09 DIAGNOSIS — I10 Essential (primary) hypertension: Secondary | ICD-10-CM

## 2021-09-09 DIAGNOSIS — M450 Ankylosing spondylitis of multiple sites in spine: Secondary | ICD-10-CM

## 2021-09-09 DIAGNOSIS — E119 Type 2 diabetes mellitus without complications: Secondary | ICD-10-CM

## 2021-09-09 LAB — POCT GLYCOSYLATED HEMOGLOBIN (HGB A1C): HbA1c, POC (controlled diabetic range): 9.2 % — AB (ref 0.0–7.0)

## 2021-09-09 MED ORDER — SILDENAFIL CITRATE 50 MG PO TABS
50.0000 mg | ORAL_TABLET | Freq: Every day | ORAL | 0 refills | Status: DC | PRN
Start: 2021-09-09 — End: 2021-11-08

## 2021-09-09 MED ORDER — INSULIN GLARGINE 100 UNIT/ML SOLOSTAR PEN: 15.0000 [IU] | PEN_INJECTOR | Freq: Every day | SUBCUTANEOUS | 11 refills | Status: DC

## 2021-09-09 MED ORDER — SEMAGLUTIDE (1 MG/DOSE) 4 MG/3ML ~~LOC~~ SOPN: 1.0000 mg | PEN_INJECTOR | SUBCUTANEOUS | 3 refills | Status: DC

## 2021-09-09 NOTE — Patient Instructions (Signed)
The blood pressure is good. I added a once a day insulin to add to your diabetes treatment I added a pill to help with the private issue we discussed.   See me in three months.

## 2021-09-10 ENCOUNTER — Ambulatory Visit: Payer: No Typology Code available for payment source | Admitting: Physical Therapy

## 2021-09-11 ENCOUNTER — Encounter: Payer: Self-pay | Admitting: Family Medicine

## 2021-09-11 NOTE — Assessment & Plan Note (Signed)
Hopefully, he can become more active and lose weight, which will help his pain, DM and HBP.

## 2021-09-11 NOTE — Progress Notes (Signed)
    SUBJECTIVE:   CHIEF COMPLAINT / HPI:   Multiple issues: Hypertension: BP initially elevated but repeat was fine.  Home BPs are reportedly good.  No CP or DOE.  Tolerating carvedilol, lisinopril and amlodipine without side effects. DM Slight decrease in A1C from 9.5 in July to 9.2 today.  Still poor control.  Maxed out on Ozempic, metformin and Jariance.  Wt is steady to down modestly.  Activity is limited due to pain.  See problem #3.  After the visit concluded, he sent me a MyChart message requesting CGM.  He has been on insulin in the past and knows how to give himself shots. Anklylosising spondilitis.  Mild changes only in back.  Has not been on a DMARD.  I referred to rheum because of accuracy of dianosis and likely need for DMARD.  Rheum believes ankylosising spondilitis is correct dx and has prescribed Xeljam, which he has yet to start.  Pain limits activity.  He hopes that he can improve exercise when pain is better controled.The gabapentin that I prescribed has helped his pain. Bipolar.  He believes his psych meds are controling his bipolar well.  He sees psychiatry and does not want any changes.  Stress at home is much less and his relationship with his wife is much improved.   ED.  Has difficulty in both achieving and maintaining an erection.       OBJECTIVE:   BP 128/82 (BP Location: Left Arm, Cuff Size: Large)   Pulse 99   Ht 6' (1.829 m)   Wt 277 lb (125.6 kg)   SpO2 98%   BMI 37.57 kg/m   VS noted. Affect and mood normal throughout. Lungs clear Cardiac RRR without m or g   ASSESSMENT/PLAN:   Bipolar 1 disorder, mixed, moderate (HCC) Stable.  No change in meds  Erectile dysfunction Trial of Viagra.  Medium dose.  Diabetes mellitus type 2 in obese (HCC) Poor control.  Refer to Dr. Nicholaus Bloom, pharm D for [facility] instruction and monitoring.    Morbid obesity (HCC) Hopefully, he can become more active and lose weight, which will help his pain, DM and  HBP.  Ankylosing spondylitis (HCC) Per Rheum.  Start on [place].  Continue gabapentin.  HTN (hypertension) Good control on current meds     Moses Manners, MD [facility]

## 2021-09-11 NOTE — Assessment & Plan Note (Signed)
Trial of Viagra.  Medium dose.

## 2021-09-11 NOTE — Assessment & Plan Note (Signed)
Poor control.  Refer to Dr. Nicholaus Bloom, pharm D for [facility] instruction and monitoring.

## 2021-09-11 NOTE — Telephone Encounter (Signed)
Left VM for patient regarding Harriette Ohara to determine if he has yet started therapy. Unable to reach - left VM requesting return call to review  Chesley Mires, PharmD, MPH, BCPS Clinical Pharmacist (Rheumatology and Pulmonology)

## 2021-09-11 NOTE — Assessment & Plan Note (Signed)
Good control on current meds. 

## 2021-09-11 NOTE — Assessment & Plan Note (Signed)
Per Rheum.  Start on [employer].  Continue gabapentin.

## 2021-09-11 NOTE — Assessment & Plan Note (Signed)
Stable   No change in meds

## 2021-09-18 IMAGING — DX DG HIP (WITH OR WITHOUT PELVIS) 2-3V*R*
3 series · 3 of 3 positions shown · non-contrast
Comparison: None.

CLINICAL DATA: Right hip pain

EXAM:
DG HIP (WITH OR WITHOUT PELVIS) 2-3V RIGHT

[dg hip unilat w or w/o pelvis 2-3 views  (1 of 3)]
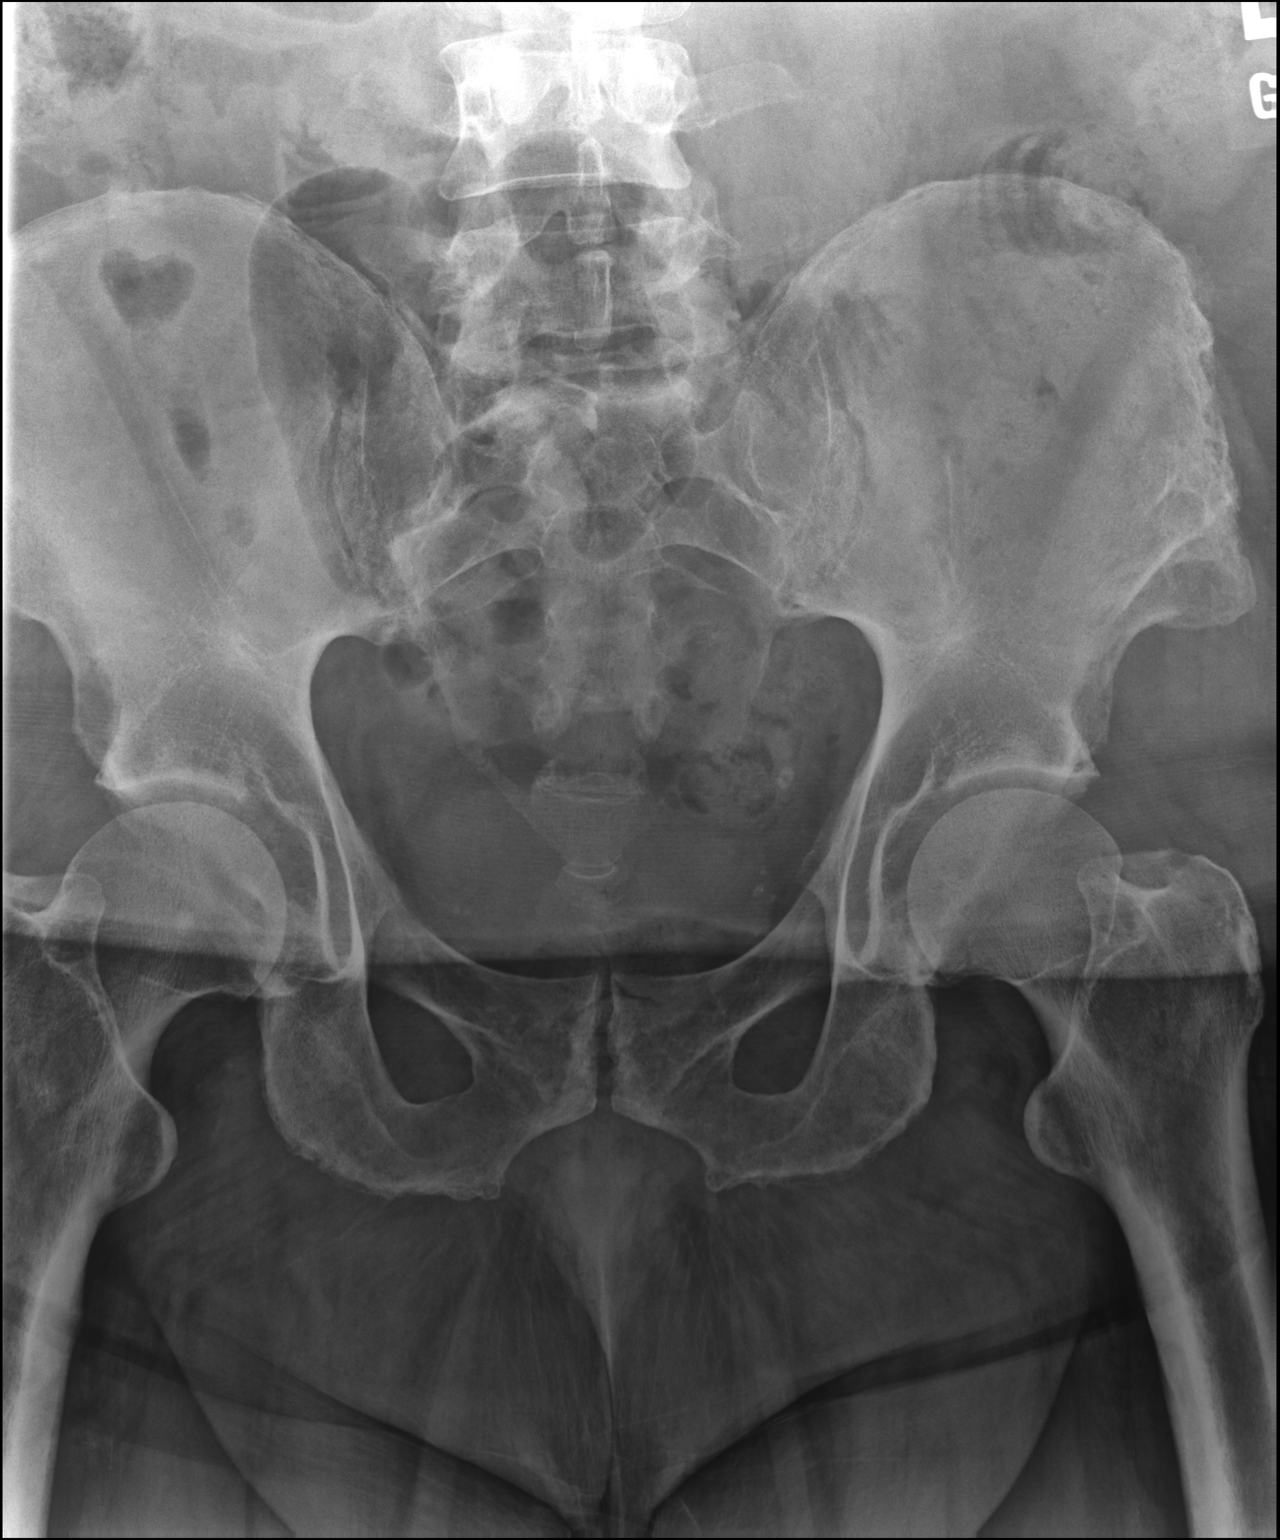

[dg hip unilat w or w/o pelvis 2-3 views  (2 of 3)]
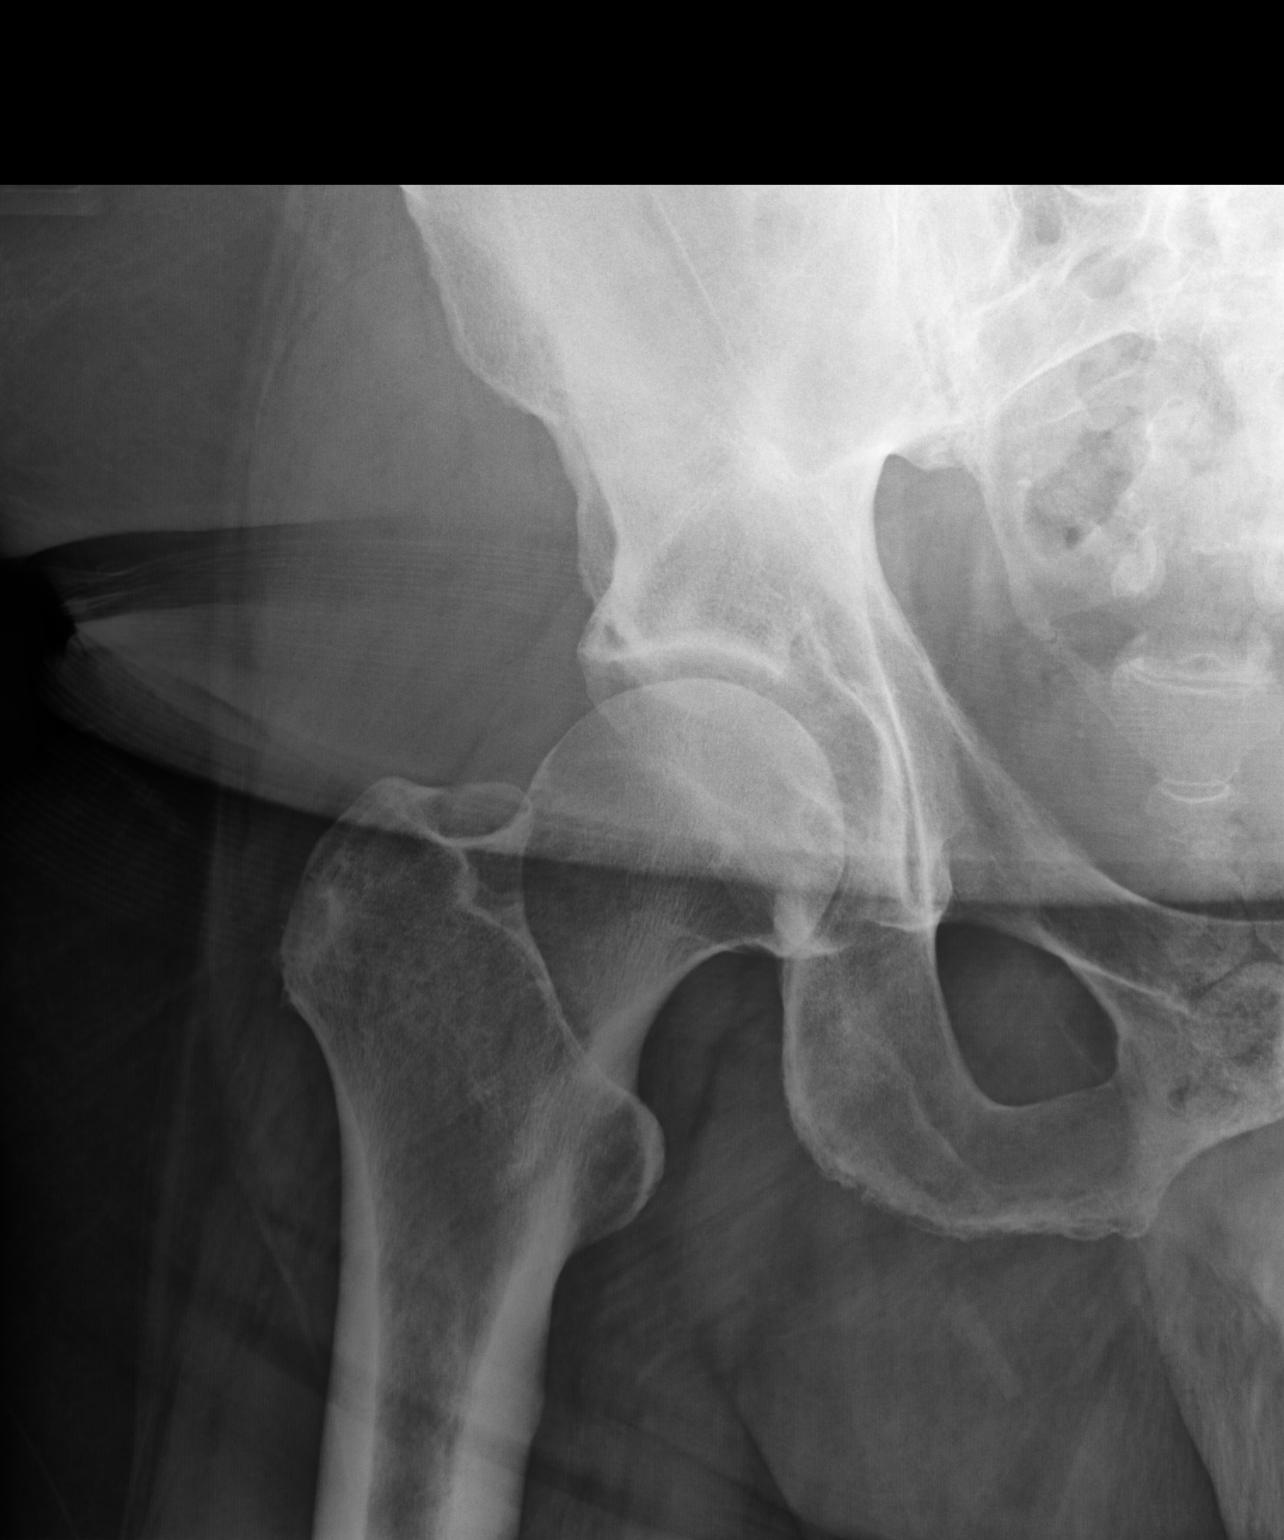

[dg hip unilat w or w/o pelvis 2-3 views  (3 of 3)]
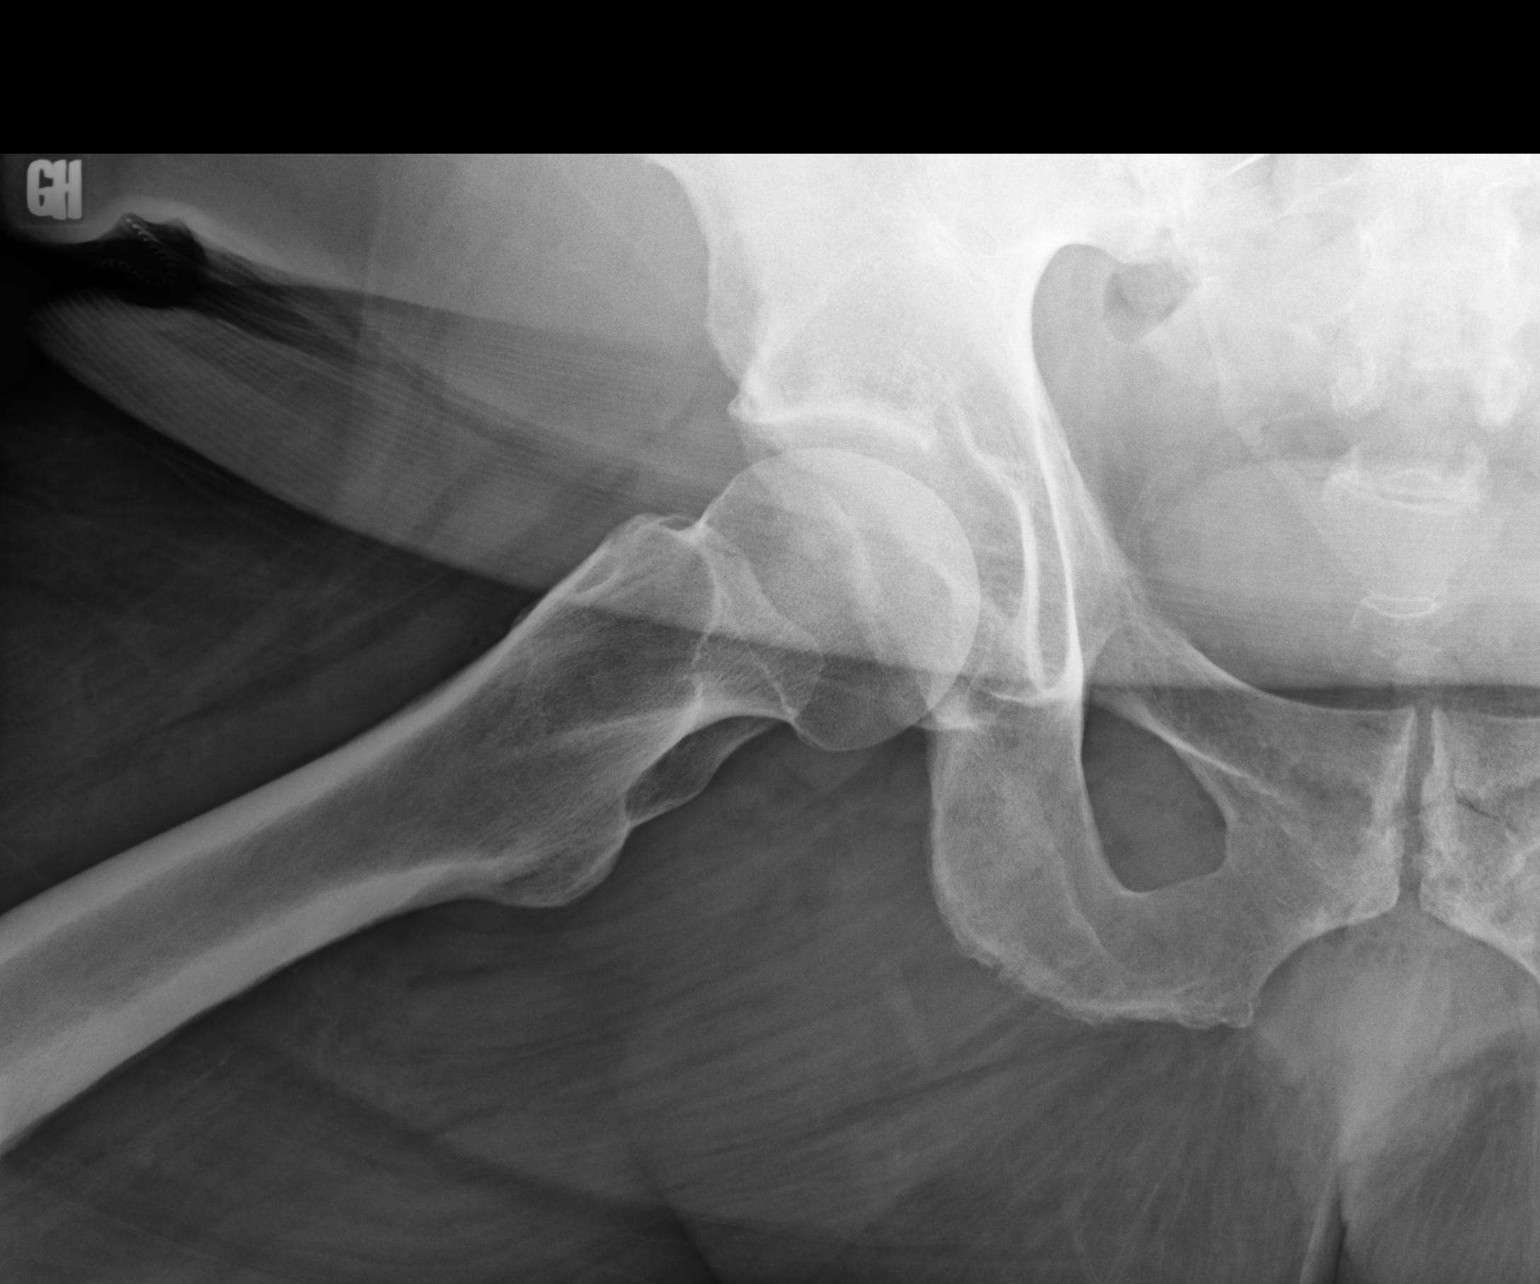

[3 of 3 positions shown; findings below may reference images not displayed]

FINDINGS: There is no evidence of hip fracture or dislocation. There is no
evidence of arthropathy or other focal bone abnormality.
IMPRESSION: Negative.

## 2021-09-26 ENCOUNTER — Ambulatory Visit (INDEPENDENT_AMBULATORY_CARE_PROVIDER_SITE_OTHER): Payer: No Typology Code available for payment source | Admitting: Pharmacist

## 2021-09-26 ENCOUNTER — Other Ambulatory Visit: Payer: Self-pay

## 2021-09-26 ENCOUNTER — Other Ambulatory Visit (HOSPITAL_COMMUNITY): Payer: Self-pay

## 2021-09-26 ENCOUNTER — Encounter: Payer: Self-pay | Admitting: Internal Medicine

## 2021-09-26 DIAGNOSIS — E7800 Pure hypercholesterolemia, unspecified: Secondary | ICD-10-CM | POA: Diagnosis not present

## 2021-09-26 DIAGNOSIS — E669 Obesity, unspecified: Secondary | ICD-10-CM

## 2021-09-26 DIAGNOSIS — E1169 Type 2 diabetes mellitus with other specified complication: Secondary | ICD-10-CM

## 2021-09-26 DIAGNOSIS — Z79899 Other long term (current) drug therapy: Secondary | ICD-10-CM | POA: Diagnosis not present

## 2021-09-26 DIAGNOSIS — Z23 Encounter for immunization: Secondary | ICD-10-CM | POA: Diagnosis not present

## 2021-09-26 MED ORDER — OZEMPIC (2 MG/DOSE) 8 MG/3ML ~~LOC~~ SOPN: 2.0000 mg | PEN_INJECTOR | SUBCUTANEOUS | 2 refills | Status: DC

## 2021-09-26 MED ORDER — FREESTYLE LIBRE 3 SENSOR MISC: 1.0000 | 2 refills | Status: DC

## 2021-09-26 NOTE — Assessment & Plan Note (Signed)
T2DM is not controlled likely due to sub-optimal lifestyle. Medication adherence appears optimal. Will increase patient's Ozempic to max dose of 2mg  once weekly. Patient also interested in CGM and will send in prescription. Discussed with patient if he begins experiencing hypoglycemia related to increased Ozempic dose he may decrease his Lantus down by 2 units. Following instruction patient verbalized understanding of treatment plan.    insulin glargine (Lantus) 15 units, Ozempic 1mg  once weekly Mondays, metformin XR 500mg  two tablets twice daily, Jardiance 25mg    1. Continued basal insulin Lantus 15 units once daily  2. Increased dose of GLP-1 Ozempic to 2mg  once weekly 3. Continued SGLT2-I Jardiance 25mg  once daily 4. Continued metformin XR 500mg  two tablets twice daily 5. Extensively discussed pathophysiology of diabetes, dietary effects on blood sugar control, and recommended lifestyle interventions,  6. Counseled on s/sx of and management of hypoglycemia 7. Next A1C anticipated February 2023.

## 2021-09-26 NOTE — Progress Notes (Signed)
Reviewed: I agree with Dr. Kelley's documentation and management. 

## 2021-09-26 NOTE — Patient Instructions (Signed)
Edward [name] it was a pleasure seeing you today.   Please do the following:  Increase your Ozempic to 2mg  once weekly as directed today during your appointment. If you have any questions or if you believe something has occurred because of this change, call me or your doctor to let one of know.  Continue checking blood sugars at home. It's really important that you record these and bring these in to your next doctor's appointment.  Continue making the lifestyle changes we've discussed together during our visit. Diet and exercise play a significant role in improving your blood sugars.  Follow-up with me in five weeks.    Hypoglycemia or low blood sugar:   Low blood sugar can happen quickly and may become an emergency if not treated right away.   While this shouldn't happen often, it can be brought upon if you skip a meal or do not eat enough. Also, if your insulin or other diabetes medications are dosed too high, this can cause your blood sugar to go to low.   Warning signs of low blood sugar include: Feeling shaky or dizzy Feeling weak or tired  Excessive hunger Feeling anxious or upset  Sweating even when you aren't exercising  What to do if I experience low blood sugar? Follow the Rule of 15 Check your blood sugar with your meter. If lower than 70, proceed to step 2.  Treat with 15 grams of fast acting carbs which is found in 3-4 glucose tablets. If none are available you can try hard candy, 1 tablespoon of sugar or honey,4 ounces of fruit juice, or 6 ounces of REGULAR soda.  Re-check your sugar in 15 minutes. If it is still below 70, do what you did in step 2 again. If your blood sugar has come back up, go ahead and eat a snack or small meal made up of complex carbs (ex. Whole grains) and protein at this time to avoid recurrence of low blood sugar.

## 2021-09-26 NOTE — Progress Notes (Signed)
Subjective:    Patient ID: Edward [name], male    DOB: 16-Mar-1971, 50 y.o.   MRN: [id number]  HPI Patient is a 50 y.o. male who presents for diabetes management. He is in good spirits and presents without assistance. Patient was referred and last seen by Primary Care Provider on 09/09/21  Patient reports diabetes was diagnosed in 2007.   Insurance coverage/medication affordability: [employer]  Family/Social history: patient is married and has two children  Current diabetes medications include: insulin glargine (Lantus) 15 units, Ozempic 1mg  once weekly Mondays, metformin XR 500mg  two tablets twice daily, Jardiance 25mg   Current hypertension medications include: amlodipine 5mg , carvedilol 12.5mg , lisinopril 5mg  Current hyperlipidemia medications include: rosuvastatin 20mg  Patient states that He is taking his medications as prescribed. Patient denies adherence with medications.   Do you feel that your medications are working for you?  yes  Have you been experiencing any side effects to the medications prescribed? no  Do you have any problems obtaining medications due to transportation or finances?  no     Patient reported dietary habits:  Intermittent fasting: 48 hours on 48 hours off States he struggles with eating too many carbohydrates Drinks:almond milk, water  Patient-reported exercise habits: denies currently due to pain   Patient denies hypoglycemic events. Patient denies polyuria (increased urination).  Patient denies polyphagia (increased appetite).  Patient denies polydipsia (increased thirst).  Patient reports neuropathy (nerve pain). Patient reports visual changes. Patient reports self foot exams.   Self-reported glucose readings: Home fasting blood sugars: 120's  Objective:   Labs:   Physical Exam Neurological:     Mental Status: He is alert and oriented to person, place, and time.    Review of Systems  Gastrointestinal:  Negative for nausea and vomiting.    Lab Results  Component Value Date   HGBA1C 9.2 (A) 09/09/2021   HGBA1C 9.5 (A) 05/02/2021   HGBA1C 9.9 (A) 10/22/2020    Lipid Panel     Component Value Date/Time   CHOL 285 (H) 05/15/2021 1424   TRIG 174 (H) 05/15/2021 1424   HDL 63 05/15/2021 1424   CHOLHDL 4.5 05/15/2021 1424   CHOLHDL 6.1 (H) 02/22/2016 1007   VLDL 35 (H) 02/22/2016 1007   LDLCALC 190 (H) 05/15/2021 1424    Clinical Atherosclerotic Cardiovascular Disease (ASCVD): No  The 10-year ASCVD risk score (Arnett DK, et al., 2019) is: 9%   Values used to calculate the score:     Age: 12 years     Sex: Male     Is Non-Hispanic African American: No     Diabetic: Yes     Tobacco smoker: No     Systolic Blood Pressure: 128 mmHg     Is BP treated: Yes     HDL Cholesterol: 63 mg/dL     Total Cholesterol: 285 mg/dL   Assessment/Plan:   07/15/2021 is not controlled likely due to sub-optimal lifestyle. Medication adherence appears optimal. Will increase patient's Ozempic to max dose of 2mg  once weekly. Patient also interested in CGM and will send in prescription. Discussed with patient if he begins experiencing hypoglycemia related to increased Ozempic dose he may decrease his Lantus down by 2 units. Following instruction patient verbalized understanding of treatment plan.    insulin glargine (Lantus) 15 units, Ozempic 1mg  once weekly Mondays, metformin XR 500mg  two tablets twice daily, Jardiance 25mg    Continued basal insulin Lantus 15 units once daily  Increased dose of GLP-1 Ozempic to 2mg  once weekly Continued SGLT2-I Jardiance  25mg  once daily Continued metformin XR 500mg  two tablets twice daily Extensively discussed pathophysiology of diabetes, dietary effects on blood sugar control, and recommended lifestyle interventions,  Counseled on s/sx of and management of hypoglycemia Next A1C anticipated February 2023.   Follow-up appointment 4 weeks to review sugar readings. Written patient instructions provided.  This  appointment required 50 minutes of direct patient care.  Thank you for involving pharmacy to assist in providing this patient's care.

## 2021-09-27 ENCOUNTER — Telehealth: Payer: Self-pay | Admitting: Family Medicine

## 2021-09-27 DIAGNOSIS — E7800 Pure hypercholesterolemia, unspecified: Secondary | ICD-10-CM

## 2021-09-27 LAB — LDL CHOLESTEROL, DIRECT: LDL Direct: 172 mg/dL — ABNORMAL HIGH (ref 0–99)

## 2021-09-27 MED ORDER — ROSUVASTATIN CALCIUM 40 MG PO TABS: 40.0000 mg | ORAL_TABLET | Freq: Every day | ORAL | 3 refills | Status: DC

## 2021-09-27 MED ORDER — EZETIMIBE 10 MG PO TABS
10.0000 mg | ORAL_TABLET | Freq: Every day | ORAL | 3 refills | Status: DC
Start: 2021-09-27 — End: 2023-01-14

## 2021-09-27 NOTE — Telephone Encounter (Signed)
Called with result of high direct LDL Patient verified that he has been taking crestor 20 regularly.   I am worried about familial hypercholesterolemia.  Will increase crestor to 40 mg daily, add zetia and refer to lipid clinic.

## 2021-09-30 ENCOUNTER — Telehealth: Payer: Self-pay

## 2021-09-30 NOTE — Telephone Encounter (Signed)
Received fax from pharmacy, PA needed on [facility] 3 sensors.  Clinical questions submitted via Cover My Meds.  Waiting on response, could take up to 72 hours.  Cover My Meds info: Key: [id number]   Veronda Prude, RN

## 2021-10-01 NOTE — Telephone Encounter (Signed)
Insurance denied [employer]. Please see below reasoning for denial.   Per your health plan's criteria, this drug is covered if you meet the following: You have tried or cannot use at least three drugs from the following: [facility], [address], [place], [place], and/or [place].   Please advise.   Veronda Prude, RN

## 2021-10-02 MED ORDER — DEXCOM G6 SENSOR MISC: 3 refills | Status: DC

## 2021-10-02 MED ORDER — DEXCOM G6 TRANSMITTER MISC
0 refills | Status: DC
Start: 2021-10-02 — End: 2022-03-31

## 2021-10-03 ENCOUNTER — Ambulatory Visit: Payer: No Typology Code available for payment source | Admitting: Internal Medicine

## 2021-10-03 NOTE — Telephone Encounter (Signed)
Future refills of Harriette Ohara to be sent to [facility]. Added to patient's preferred pharmacy list today  Chesley Mires, PharmD, MPH, BCPS Clinical Pharmacist (Rheumatology and Pulmonology)

## 2021-10-24 ENCOUNTER — Ambulatory Visit: Payer: No Typology Code available for payment source | Admitting: Pharmacist

## 2021-10-31 ENCOUNTER — Other Ambulatory Visit: Payer: Self-pay

## 2021-10-31 ENCOUNTER — Telehealth (INDEPENDENT_AMBULATORY_CARE_PROVIDER_SITE_OTHER): Payer: PRIVATE HEALTH INSURANCE | Admitting: Pharmacist

## 2021-10-31 DIAGNOSIS — E1169 Type 2 diabetes mellitus with other specified complication: Secondary | ICD-10-CM

## 2021-10-31 DIAGNOSIS — E669 Obesity, unspecified: Secondary | ICD-10-CM | POA: Diagnosis not present

## 2021-10-31 NOTE — Progress Notes (Signed)
Subjective:    Patient ID: Edward [name], male    DOB: 09-Jan-1971, 51 y.o.   MRN: [id number]  HPI Patient is a 51 y.o. male who presents for diabetes management and Dexcom discussion. He is in good spirits and presents without assistance. Patient was referred and last seen by Primary Care Provider on 09/09/21. Last seen in pharmacy clinic on 09/26/21.  I connected with  Henderson Newcomer on 11/06/21 by a video enabled telemedicine application and verified that I am speaking with the correct person using two identifiers.   I discussed the limitations of evaluation and management by telemedicine. The patient expressed understanding and agreed to proceed. Patient located at home and provider located in office.  Patient reports diabetes was diagnosed in 2007.   Insurance coverage/medication affordability: [employer]  Family/Social history: patient is married and has two children  Current diabetes medications include: insulin glargine (Lantus) 15 units, Ozempic 25m once weekly Mondays, metformin XR 5063mtwo tablets twice daily, Jardiance 2584mCurrent hypertension medications include: amlodipine 5mg104marvedilol 12.5mg,35msinopril 5mg C60ment hyperlipidemia medications include: rosuvastatin 20mg P16mnt states that He is taking his medications as prescribed. Patient denies adherence with medications.   Do you feel that your medications are working for you?  yes  Have you been experiencing any side effects to the medications prescribed? no  Do you have any problems obtaining medications due to transportation or finances?  no     Patient reported dietary habits:  Intermittent fasting: 48 hours on 48 hours off States he struggles with eating too many carbohydrates Drinks:almond milk, water  Patient-reported exercise habits: denies currently due to pain but pain is improving   Patient denies hypoglycemic events. Patient denies polyuria (increased urination).  Patient denies polyphagia  (increased appetite).  Patient denies polydipsia (increased thirst).  Patient reports neuropathy (nerve pain). Patient reports visual changes. Patient reports self foot exams.   Self-reported glucose readings: Home fasting blood sugars: 120's  Dexcom G6 patient education Person(s)instructed: patient  Patient taking >1 gram acetaminophen every 6 hours: denies  Patient taking hydroxyrea: denies.  Instruction: Patient oriented to three components of Dexcom G6 continuous glucose monitor (sensor, transmitter, receiver/cellphone) Receiver or cellphone: cellphone Dexcom G6 AND dexcom clarity app downloaded onto cellphone :yes  Patient educated that Dexom G6 app must always be running (patient should not close out of app) If using Dexcom G6 app, patient may share blood glucose data with up to 10 followers on dexcom follow app.  CGM overview and set-up  Button, touch screen, and icons Power supply and recharging Home screen Date and time Set BG target range: 90-250 Set alarm/alert tone  Interstitial vs. capillary blood glucose readings  When to verify sensor reading with fingerstick blood glucose Blood glucose reading measured every five minutes. Sensor will last 10 days Transmitter will last 90 days and must be reused  Transmitter must be within 20 feet of receiver/cell phone.  Sensor application -- sensor placed on arm Site selection and site prep with alcohol pad Sensor prep-sensor pack and sensor applicator Sensor applied to area away from waistband, scarring, tattoos, irritation, and bones Transmitter sanitized with alcohol pad and inserted into sensor. Starting the sensor: 2 hour warm up before BG readings available Sensor change every 10 days and rotate site Call [facility] if sensor comes off before 10 days  Safety and Troubleshooting Do a fingerstick blood glucose test if the sensor readings do not match how you feel Remove sensor prior to magnetic  resonance  imaging (MRI), computed tomography (CT) scan, or high-frequency electrical heat (diathermy) treatment. Do not allow sun screen or insect repellant to come into contact with Dexcom G6. These skin care products may lead for the plastic used in the Dexcom G6 to crack. Dexcom G6 may be worn through a Environmental education officer. It may not be exposed to an advanced Imaging Technology (AIT) body scanner (also called a millimeter wave scanner) or the baggage x-ray machine. Instead, ask for hand-wanding or full-body pat-down and visual inspection.  Doses of acetaminophen (Tylenol) >1 gram every 6 hours may cause false high readings. Hydroxyurea (Hydrea, Droxia) may interfere with accuracy of blood glucose readings from Dexcom G6. Store sensor kit between 36 and 86 degrees Farenheit. Can be refrigerated within this temperature range.  Contact information provided for [facility] customer service and/or trainer.  Objective:   Labs:   Physical Exam Neurological:     Mental Status: He is alert and oriented to person, place, and time.    Review of Systems  Gastrointestinal:  Negative for nausea and vomiting.   Lab Results  Component Value Date   HGBA1C 9.2 (A) 09/09/2021   HGBA1C 9.5 (A) 05/02/2021   HGBA1C 9.9 (A) 10/22/2020    Lipid Panel     Component Value Date/Time   CHOL 285 (H) 05/15/2021 1424   TRIG 174 (H) 05/15/2021 1424   HDL 63 05/15/2021 1424   CHOLHDL 4.5 05/15/2021 1424   CHOLHDL 6.1 (H) 02/22/2016 1007   VLDL 35 (H) 02/22/2016 1007   LDLCALC 190 (H) 05/15/2021 1424   LDLDIRECT 172 (H) 09/26/2021 1103    Clinical Atherosclerotic Cardiovascular Disease (ASCVD): No  The 10-year ASCVD risk score (Arnett DK, et al., 2019) is: 9%   Values used to calculate the score:     Age: 38 years     Sex: Male     Is Non-Hispanic African American: No     Diabetic: Yes     Tobacco smoker: No     Systolic Blood Pressure: 157 mmHg     Is BP treated: Yes     HDL Cholesterol: 63  mg/dL     Total Cholesterol: 285 mg/dL   Assessment/Plan:   T2DM is not controlled but improving likely due to changes in lifestyle and medication regimen. Medication adherence appears optimal. Will make further regimen adjustments at next appointment based on patient's CGM results. Following instruction patient verbalized understanding of treatment plan.    Continued basal insulin Lantus 15 units once daily  Continued GLP-1 Ozempic to 21m once weekly Continued SGLT2-I Jardiance 271monce daily Continued metformin XR 50078mwo tablets twice daily Extensively discussed pathophysiology of diabetes, dietary effects on blood sugar control, and recommended lifestyle interventions,  Counseled on s/sx of and management of hypoglycemia Next A1C anticipated February 2023.   Follow-up appointment 4 weeks to review sugar readings. Written patient instructions provided.  This appointment required 30 minutes of non-face-to-face direct patient care.  Thank you for involving pharmacy to assist in providing this patient's care.

## 2021-11-06 NOTE — Progress Notes (Signed)
Office Visit Note  Patient: Edward [name]             Date of Birth: [birth year]           MRN: [id number]             PCP: Zenia Resides, MD Referring: Zenia Resides, MD Visit Date: 11/07/2021   Subjective:   History of Present Illness: Edward [name] is a 51 y.o. male here for follow up for joint pains with chronic ankylosing spondylitis follow up after starting xeljanz ER 11 mg PO daily.  He has noticed a significant improvement in his chronic back pain and stiffness particularly the nighttime pain and awakenings and early morning stiffness.  He continues having some peripheral joint pain problems overall he rates symptoms as 4 out of 10.  He notices this acting up a lot more with cold temperatures in the rainy weather.  Worst affected is his right hand and elbow he does experience some swelling around the right hand finger joints and numbness from the elbow down to the fourth and fifth finger in particular.  He has trouble fully closing his grip on the right.  Previous HPI 08/22/21 Edward [name] is a 51 y.o. male here for follow up for chronic joint pains in multiple sites with history of ankylosing spondylitis currently on NSAIDs. After our last visit findings were inconclusive so went for lumbar and sacral MRI. He continues having similar chronic low back pain and stiffness especially in the morning and at night. He also reports some worsening in hand pain and stiffness, sometimes extends up to the right elbow and shoulder.   Previous HPI 07/02/21 Edward [name] is a 51 y.o. male here for back pain and joint pains with history of ankylosing spondylitis.  He has a longstanding disease history with original diagnosis in late teenage and adult years.  Treatments have primarily consisted of chronic long-term use of oral NSAIDs switching between several different medications over time.  He has also had prior knee surgeries with problems from right worse than left osteoarthritis arthroscopy  for injuries related to football and benign bone mass excision.  About 6 years ago was seen in rheumatology and recommended trial of DMARD treatment he took Humira and Enbrel.  He developed a significant pneumonia within 1 year of each medication and discontinued these.  He never felt a huge improvement on either medicine.  He also continues to have intermittent exacerbations and development of impingement and radicular type pain down 1 extremity or another usually 1 side at a time and lasting for up to a few months.  At present he has more trouble on the right side in both upper and lower back and going down around the lateral hip.  He is having some symptom improvement with oral meloxicam and tramadol prescribed through his PCP office at low-dose has been a good benefit. He saw Dr. Gerilyn Nestle in 2019 but apparently not much follow up or ability to continue treatment at least in part due to change in insurance status. Interestingly his most recent x-ray studies did not report significant sacroiliitis changes in the lumbar spine and pelvis.  Prior imaging from May 2016 in [place] system was reported consistent with inflammatory changes.   Review of Systems  Constitutional:  Negative for fever.  Gastrointestinal:  Negative for abdominal pain and diarrhea.  Musculoskeletal:  Positive for joint pain, joint pain, joint swelling and morning stiffness.  Skin:  Negative for rash.  Neurological:  Positive for numbness and parasthesias. Negative for weakness.   PMFS History:  Patient Active Problem List   Diagnosis Date Noted   Pain in right hand 11/07/2021   Pain in right elbow 11/07/2021   Erectile dysfunction 09/09/2021   Colon cancer screening 06/10/2021   Right leg pain 05/15/2021   Encounter for hepatitis C screening test for low risk patient 05/15/2021   Severe episode of recurrent major depressive disorder, without psychotic features ([place]) 09/23/2019   High risk medication use  02/08/2018   Healthcare maintenance 05/02/2016   Microalbuminuria 04/14/2016   Encounter for chronic pain management 04/10/2016   OCD (obsessive compulsive disorder) 04/10/2016   Vitamin D deficiency 02/26/2016   Bipolar 1 disorder, mixed, moderate ([place]) 02/22/2016   Migraines 02/22/2016   Osteoarthritis of both knees 02/22/2016   Controlled substance agreement signed 12/13/2015   Morbid obesity ([place]) 08/08/2015   HTN (hypertension) 08/08/2015   Diabetes mellitus type 2 in obese ([place]) [id number]   Metabolic syndrome [id number]   Hypertriglyceridemia 08/08/2015   Hypercholesteremia 08/08/2015   Ankylosing spondylitis ([place]) 08/08/2015   S/P gastric bypass 08/06/2015   Pain in joint of right knee 07/08/2014    Past Medical History:  Diagnosis Date   Rheumatic fever age 60    Family History  Problem Relation Age of Onset   Hyperlipidemia Mother    Hypertension Mother    Breast cancer Mother    Thyroid disease Father    Early death Father 38       Cancer   Diabetes Father    Autoimmune disease Father        Ankylosing Spondylitis   Squamous cell carcinoma Father    Diabetes Brother    Hyperlipidemia Brother    Hypertension Brother    Thyroid disease Brother    Alcohol abuse Brother    Heart disease Maternal Grandmother    Stroke Maternal Grandmother    Heart disease Maternal Grandfather    Autoimmune disease Paternal Grandfather    Past Surgical History:  Procedure Laterality Date   arthroscopic surgery Left years ago   both knee cartlidge surgery     GASTRIC ROUX-EN-Y N/A 08/06/2015   Procedure: LAPAROSCOPIC ROUX-EN-Y GASTRIC BYPASS WITH UPPER ENDOSCOPY;  Surgeon: Greer Pickerel, MD;  Location: [facility];  Service: General;  Laterality: N/A;   HIP SURGERY Left 1986   with knee surgery   KNEE SURGERY Right 1986   benign tumor with bone graft    UPPER GI ENDOSCOPY  08/06/2015   Procedure: UPPER GI ENDOSCOPY;  Surgeon: Greer Pickerel, MD;  Location: [facility];  Service: General;;    Social History   Social History Narrative   02/22/16   Married: Edward [name]   Children: Edward [name] (anxiety/depression), Edward [name] (Autism, non-verbal), Edward [name]   Education: PhD in [employer]   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   Immunization History  Administered Date(s) Administered   Influenza,inj,Quad PF,6+ Mos 08/08/2015, 08/15/2016, 11/12/2017, 09/26/2021   Influenza,inj,quad, With Preservative 07/30/2013, 09/05/2014   Influenza-Unspecified 09/11/2011, 08/08/2015, 08/15/2016, 11/12/2017   PFIZER Comirnaty(Gray Top)Covid-19 Tri-Sucrose Vaccine 05/15/2021   PFIZER(Purple Top)SARS-COV-2 Vaccination 07/20/2020   PPD Test 01/03/2013   Pneumococcal Polysaccharide-23 10/15/2010, 04/30/2017   Tdap 10/13/2004, 10/15/2010, 05/02/2016     Objective: Vital Signs: BP 118/83 (BP Location: Right Arm, Patient Position: Sitting, Cuff Size: Large)    Pulse (!) 103    Resp 12    Ht 5' 10.5" (1.791 m)    Wt 276 lb 12.8 oz (125.6 kg)  BMI 39.16 kg/m    Physical Exam Constitutional:      Appearance: He is obese.  Cardiovascular:     Rate and Rhythm: Normal rate and regular rhythm.  Pulmonary:     Effort: Pulmonary effort is normal.     Breath sounds: Normal breath sounds.  Skin:    General: Skin is warm and dry.     Findings: No rash.  Neurological:     Mental Status: He is alert.  Psychiatric:        Mood and Affect: Mood normal.     Musculoskeletal Exam:  Shoulders full ROM no tenderness or swelling Elbows full ROM right elbow tenderness pressure over olecranon, there is mild swelling or chronic soft tissue thickening, no warmth or erythema Wrists full ROM no tenderness or swelling Right 2nd-3rd MCPs with mild swelling or chronic enlargement, slightly decreased flexion ROM and tenderness to palpation No paraspinal tenderness to palpation over upper and lower back Knees full ROM no tenderness or swelling Ankles full ROM no tenderness or  swelling  Investigation: No additional findings.  Imaging: XR Elbow 2 Views Right  Result Date: 11/07/2021 Xray right elbow 2 views Of the joint spaces appear normal.  There are some cystic changes and probable early marginal osteophyte changes at the lateral humeral ulnar joint.  No erosions or significant enthesophytes.  Bone mineralization appears normal. Impression Mild changes at anterior humeral ulnar joint area suspect represents mild chronic degenerative arthritis, no erosive disease or significant joint space changes seen  XR Hand 2 View Right  Result Date: 11/07/2021 X-ray right hand 2 views Radiocarpal and carpal joints appear normal.  There is mild joint space narrowing and osteophyte formation at the second and third MCPs.  There is a small possible periarticular erosion versus cystic change at the third PIP joint.  Otherwise PIP and DIP joints appear normal.  Bone mineralization appears normal. Impression Osteoarthritis changes primarily limited to the second and third MCPs suggestive for underlying chronic inflammatory or metabolic disease, possible small erosion at the third PIP joint, no acute injury changes   Recent Labs: Lab Results  Component Value Date   WBC 12.0 (H) 07/02/2021   HGB 17.3 (H) 07/02/2021   PLT 342 07/02/2021   NA 144 05/15/2021   K 4.5 05/15/2021   CL 103 05/15/2021   CO2 25 05/15/2021   GLUCOSE 158 (H) 05/15/2021   BUN 12 05/15/2021   CREATININE 0.88 05/15/2021   BILITOT 0.4 05/15/2021   ALKPHOS 102 05/15/2021   AST 14 05/15/2021   ALT 6 05/15/2021   PROT 6.3 05/15/2021   ALBUMIN 3.8 (L) 05/15/2021   CALCIUM 9.4 05/15/2021   GFRAA 108 12/15/2019   QFTBGOLDPLUS NEGATIVE 07/02/2021    Speciality Comments: No specialty comments available.  Procedures:  No procedures performed Allergies: Demerol [meperidine], Nsaids, and Tapentadol   Assessment / Plan:     Visit Diagnoses: Ankylosing spondylitis of multiple sites in spine ([place]) - Plan:  Sedimentation rate, C-reactive protein  So far he reports noticeable improvement in the back pain and stiffness since starting Morrie Sheldon although its been only a short time.  There may be more benefit with longer treatment that is possible the 4 out of 10 improved symptoms may not progress to much more since back imaging did show mostly chronic established change from sacroiliitis.  Rechecking sed rate and CRP for disease activity monitoring these were very borderline elevations prior to starting treatment.  We will plan to continue the [facility] ER  11 mg daily.  Pain in right hand - Plan: XR Hand 2 View Right Pain in right elbow - Plan: XR Elbow 2 Views Right  Pain in the right hand and elbow there is a small amount of swelling overlying the areas of complaint.  May be some cubital tunnel impingement occurring with the radiating numbness intermittently down the arm.  Checking x-rays of the elbow and hand in clinic today there is MCP joint predominant osteoarthritis of the hand which may be secondary to underlying systemic disease.  There is very mild arthritis change in the elbow I do not believe that this would cause impingement symptoms.  High risk medication use - Plan: CBC with Differential/Platelet, COMPLETE METABOLIC PANEL WITH GFR, Lipid panel  Checking CBC CMP and lipid panel today for monitoring after starting the Xeljanz.  Orders: Orders Placed This Encounter  Procedures   XR Elbow 2 Views Right   XR Hand 2 View Right   CBC with Differential/Platelet   COMPLETE METABOLIC PANEL WITH GFR   Lipid panel   Sedimentation rate   C-reactive protein   No orders of the defined types were placed in this encounter.    Follow-Up Instructions: Return in about 3 months (around 02/05/2022) for AS on Xeljanz f/u 72mos.   Collier Salina, MD  Note - This record has been created using [employer].  Chart creation errors have been sought, but may not always  have been located. Such  creation errors do not reflect on  the standard of medical care.

## 2021-11-06 NOTE — Assessment & Plan Note (Signed)
T2DM is not controlled but improving likely due to changes in lifestyle and medication regimen. Medication adherence appears optimal. Will make further regimen adjustments at next appointment based on patient's CGM results. Following instruction patient verbalized understanding of treatment plan.    1. Continued basal insulin Lantus 15 units once daily  2. Continued GLP-1 Ozempic to 2mg  once weekly 3. Continued SGLT2-I Jardiance 25mg  once daily 4. Continued metformin XR 500mg  two tablets twice daily 5. Extensively discussed pathophysiology of diabetes, dietary effects on blood sugar control, and recommended lifestyle interventions,  6. Counseled on s/sx of and management of hypoglycemia 7. Next A1C anticipated February 2023.   Follow-up appointment 4 weeks to review sugar reading

## 2021-11-07 ENCOUNTER — Other Ambulatory Visit: Payer: Self-pay

## 2021-11-07 ENCOUNTER — Encounter: Payer: Self-pay | Admitting: Internal Medicine

## 2021-11-07 ENCOUNTER — Ambulatory Visit: Payer: Self-pay

## 2021-11-07 ENCOUNTER — Ambulatory Visit (INDEPENDENT_AMBULATORY_CARE_PROVIDER_SITE_OTHER): Payer: PRIVATE HEALTH INSURANCE | Admitting: Internal Medicine

## 2021-11-07 VITALS — BP 118/83 | HR 103 | Resp 12 | Ht 70.5 in | Wt 276.8 lb

## 2021-11-07 DIAGNOSIS — Z79899 Other long term (current) drug therapy: Secondary | ICD-10-CM

## 2021-11-07 DIAGNOSIS — M450 Ankylosing spondylitis of multiple sites in spine: Secondary | ICD-10-CM

## 2021-11-07 DIAGNOSIS — M25521 Pain in right elbow: Secondary | ICD-10-CM

## 2021-11-07 DIAGNOSIS — M79641 Pain in right hand: Secondary | ICD-10-CM | POA: Insufficient documentation

## 2021-11-08 ENCOUNTER — Other Ambulatory Visit: Payer: Self-pay

## 2021-11-08 ENCOUNTER — Encounter: Payer: Self-pay | Admitting: Family Medicine

## 2021-11-08 ENCOUNTER — Other Ambulatory Visit: Payer: Self-pay | Admitting: Family Medicine

## 2021-11-08 DIAGNOSIS — N529 Male erectile dysfunction, unspecified: Secondary | ICD-10-CM

## 2021-11-08 DIAGNOSIS — Z79899 Other long term (current) drug therapy: Secondary | ICD-10-CM

## 2021-11-08 LAB — CBC WITH DIFFERENTIAL/PLATELET
Absolute Monocytes: 702 cells/uL (ref 200–950)
Basophils Absolute: 65 cells/uL (ref 0–200)
Basophils Relative: 0.5 %
Eosinophils Absolute: 182 cells/uL (ref 15–500)
Eosinophils Relative: 1.4 %
HCT: 50.2 % — ABNORMAL HIGH (ref 38.5–50.0)
Hemoglobin: 15.9 g/dL (ref 13.2–17.1)
Lymphs Abs: 2418 cells/uL (ref 850–3900)
MCH: 27.7 pg (ref 27.0–33.0)
MCHC: 31.7 g/dL — ABNORMAL LOW (ref 32.0–36.0)
MCV: 87.3 fL (ref 80.0–100.0)
MPV: 10.9 fL (ref 7.5–12.5)
Monocytes Relative: 5.4 %
Neutro Abs: 9633 cells/uL — ABNORMAL HIGH (ref 1500–7800)
Neutrophils Relative %: 74.1 %
Platelets: 235 10*3/uL (ref 140–400)
RBC: 5.75 10*6/uL (ref 4.20–5.80)
RDW: 17.1 % — ABNORMAL HIGH (ref 11.0–15.0)
Total Lymphocyte: 18.6 %
WBC: 13 10*3/uL — ABNORMAL HIGH (ref 3.8–10.8)

## 2021-11-08 LAB — COMPLETE METABOLIC PANEL WITH GFR
AG Ratio: 1.5 (calc) (ref 1.0–2.5)
ALT: 13 U/L (ref 9–46)
AST: 25 U/L (ref 10–40)
Albumin: 3.3 g/dL — ABNORMAL LOW (ref 3.6–5.1)
Alkaline phosphatase (APISO): 75 U/L (ref 36–130)
BUN/Creatinine Ratio: 16 (calc) (ref 6–22)
BUN: 24 mg/dL (ref 7–25)
CO2: 28 mmol/L (ref 20–32)
Calcium: 8.9 mg/dL (ref 8.6–10.3)
Chloride: 106 mmol/L (ref 98–110)
Creat: 1.47 mg/dL — ABNORMAL HIGH (ref 0.60–1.29)
Globulin: 2.2 g/dL (calc) (ref 1.9–3.7)
Glucose, Bld: 194 mg/dL — ABNORMAL HIGH (ref 65–99)
Potassium: 4.1 mmol/L (ref 3.5–5.3)
Sodium: 141 mmol/L (ref 135–146)
Total Bilirubin: 0.5 mg/dL (ref 0.2–1.2)
Total Protein: 5.5 g/dL — ABNORMAL LOW (ref 6.1–8.1)
eGFR: 58 mL/min/{1.73_m2} — ABNORMAL LOW (ref >=60)

## 2021-11-08 LAB — C-REACTIVE PROTEIN: CRP: 0.7 mg/L (ref <8.0)

## 2021-11-08 LAB — LIPID PANEL
Cholesterol: 186 mg/dL (ref <200)
HDL: 49 mg/dL (ref >=40)
LDL Cholesterol (Calc): 107 mg/dL (calc) — ABNORMAL HIGH
Non-HDL Cholesterol (Calc): 137 mg/dL (calc) — ABNORMAL HIGH (ref <130)
Total CHOL/HDL Ratio: 3.8 (calc) (ref <5.0)
Triglycerides: 188 mg/dL — ABNORMAL HIGH (ref <150)

## 2021-11-08 LAB — SEDIMENTATION RATE: Sed Rate: 9 mm/h (ref 0–15)

## 2021-11-08 MED ORDER — SILDENAFIL CITRATE 50 MG PO TABS: 50.0000 mg | ORAL_TABLET | Freq: Every day | ORAL | 3 refills | Status: DC | PRN

## 2021-11-08 MED ORDER — CARVEDILOL 12.5 MG PO TABS: 12.5000 mg | ORAL_TABLET | Freq: Two times a day (BID) | ORAL | 3 refills | Status: DC

## 2021-11-08 NOTE — Progress Notes (Signed)
Xrays look okay there is definitely some osteoarthritis in the 2nd and 3rd knuckle and elbow but no bad erosions or damage. Blood count and cholesterol test show no problems. His sed rate and CRP which are markers of inflammation are both improved. His kidney function test is mildly worse than before. Morrie Sheldon would not usually explain this. I think we ought to repeat the basic metabolic panel in about 2 weeks to see if this is a real persistent change. If it is, we might have to try stopping the medicine.

## 2021-11-10 ENCOUNTER — Other Ambulatory Visit: Payer: Self-pay | Admitting: Family Medicine

## 2021-11-10 DIAGNOSIS — M450 Ankylosing spondylitis of multiple sites in spine: Secondary | ICD-10-CM

## 2021-11-10 DIAGNOSIS — M455 Ankylosing spondylitis of thoracolumbar region: Secondary | ICD-10-CM

## 2021-11-21 ENCOUNTER — Telehealth: Payer: PRIVATE HEALTH INSURANCE | Admitting: Pharmacist

## 2021-11-21 ENCOUNTER — Other Ambulatory Visit: Payer: Self-pay

## 2021-11-28 ENCOUNTER — Encounter: Payer: Self-pay | Admitting: Pharmacist

## 2021-12-06 ENCOUNTER — Telehealth: Payer: Self-pay | Admitting: Pharmacist

## 2021-12-06 NOTE — Telephone Encounter (Signed)
Patient reported lost Dexcom transmitter during recent move.   Next fill not due until Early May.  Transmitter provided for pick-up on Monday visit with PCP, Dr. Andria Frames.   I am willing to pick up this patient in follow-up after the departure of Dr. Hughes Better, PharmD if necessary.  Please schedule with me if needed.

## 2021-12-09 ENCOUNTER — Encounter: Payer: Self-pay | Admitting: Family Medicine

## 2021-12-09 ENCOUNTER — Ambulatory Visit: Payer: PRIVATE HEALTH INSURANCE | Admitting: Family Medicine

## 2021-12-09 ENCOUNTER — Other Ambulatory Visit: Payer: Self-pay

## 2021-12-09 VITALS — BP 126/78 | HR 83 | Ht 72.0 in | Wt 263.4 lb

## 2021-12-09 DIAGNOSIS — E1169 Type 2 diabetes mellitus with other specified complication: Secondary | ICD-10-CM | POA: Diagnosis not present

## 2021-12-09 DIAGNOSIS — E7800 Pure hypercholesterolemia, unspecified: Secondary | ICD-10-CM

## 2021-12-09 DIAGNOSIS — M450 Ankylosing spondylitis of multiple sites in spine: Secondary | ICD-10-CM | POA: Diagnosis not present

## 2021-12-09 DIAGNOSIS — E669 Obesity, unspecified: Secondary | ICD-10-CM | POA: Diagnosis not present

## 2021-12-09 LAB — POCT GLYCOSYLATED HEMOGLOBIN (HGB A1C): HbA1c, POC (controlled diabetic range): 7.3 % — AB (ref 0.0–7.0)

## 2021-12-09 NOTE — Patient Instructions (Signed)
Keep up the great work. Lets see where this virtuous cycle takes you. See me again in 3 months

## 2021-12-10 ENCOUNTER — Encounter: Payer: Self-pay | Admitting: Family Medicine

## 2021-12-10 NOTE — Assessment & Plan Note (Signed)
Great improvement, but not quite at goal.  Since he is on a mission to continue weight loss, no change in meds for now.

## 2021-12-10 NOTE — Assessment & Plan Note (Signed)
Excellent improvement in A1C.  Not quite at goal.  Continue current meds along with diet, exercise and weight loss.

## 2021-12-10 NOTE — Assessment & Plan Note (Signed)
Applauded excellent weight loss and encouraged continued work.

## 2021-12-10 NOTE — Progress Notes (Signed)
° ° °  SUBJECTIVE:   CHIEF COMPLAINT / HPI:   Diabetes and more: Anklyosis spondylitis: He is extremely happy with the treatment by rheum.  Much less pain.  Able to be more active.  See obesity and diabetes below. Diabetes.  Outstanding improvement (finally) with A1C down to 7.3.  We attribute this improvement to diet and exercise.  Morbid obesity: Outstanding trend in weight loss with improved activity and diet.  He has strong goals to lose further weight and decrease the number of medications he is taking. Wants to donate plasma.  I had forms to fill out and provided him a med list.  Forms returned to patient Hypercholesterolemia: Again a very nice improvement.  On rosuvastatin and zetia, his LDL dropped from 172 to 107    OBJECTIVE:   BP 126/78    Pulse 83    Ht 6' (1.829 m)    Wt 263 lb 6.4 oz (119.5 kg)    SpO2 97%    BMI 35.72 kg/m   VS, especially wt noted Lungs clear Cardiac RRR without m or g   ASSESSMENT/PLAN:   Hypercholesteremia Great improvement, but not quite at goal.  Since he is on a mission to continue weight loss, no change in meds for now.    Diabetes mellitus type 2 in obese (H[facility]) Excellent improvement in A1C.  Not quite at goal.  Continue current meds along with diet, exercise and weight loss.  Ankylosing spondylitis ([facility]) Excellent pain relief with Harriette Ohara.  This has allowed increased activity and wt loss  Morbid obesity ([facility]) Applauded excellent weight loss and encouraged continued work.       Moses Manners, MD [facility]

## 2021-12-10 NOTE — Assessment & Plan Note (Signed)
Excellent pain relief with Harriette Ohara.  This has allowed increased activity and wt loss

## 2021-12-12 ENCOUNTER — Encounter: Payer: Self-pay | Admitting: Family Medicine

## 2021-12-12 ENCOUNTER — Other Ambulatory Visit: Payer: Self-pay | Admitting: Family Medicine

## 2021-12-12 DIAGNOSIS — F3162 Bipolar disorder, current episode mixed, moderate: Secondary | ICD-10-CM

## 2021-12-12 DIAGNOSIS — M450 Ankylosing spondylitis of multiple sites in spine: Secondary | ICD-10-CM

## 2021-12-12 DIAGNOSIS — G43001 Migraine without aura, not intractable, with status migrainosus: Secondary | ICD-10-CM

## 2021-12-12 DIAGNOSIS — M455 Ankylosing spondylitis of thoracolumbar region: Secondary | ICD-10-CM

## 2021-12-13 MED ORDER — QUETIAPINE FUMARATE 25 MG PO TABS: 25.0000 mg | ORAL_TABLET | Freq: Every morning | ORAL | 3 refills | Status: DC

## 2021-12-28 ENCOUNTER — Encounter: Payer: Self-pay | Admitting: Family Medicine

## 2022-01-01 ENCOUNTER — Other Ambulatory Visit: Payer: Self-pay

## 2022-01-01 ENCOUNTER — Encounter: Payer: Self-pay | Admitting: Family Medicine

## 2022-01-01 ENCOUNTER — Ambulatory Visit: Payer: PRIVATE HEALTH INSURANCE | Admitting: Family Medicine

## 2022-01-01 DIAGNOSIS — F3162 Bipolar disorder, current episode mixed, moderate: Secondary | ICD-10-CM

## 2022-01-01 DIAGNOSIS — F429 Obsessive-compulsive disorder, unspecified: Secondary | ICD-10-CM | POA: Diagnosis not present

## 2022-01-01 DIAGNOSIS — I10 Essential (primary) hypertension: Secondary | ICD-10-CM

## 2022-01-01 NOTE — Patient Instructions (Addendum)
Stop the amoldipine for now. I worry you are on too much blood pressure medication with all the weight you have lost.   ?No other medicine changes ?Someone should call to set up the psychiatry appointment.   ?See me next week.  I will look at your blood pressure. ?If you will see the psychiatrist soon, I will wait for them to start medications.  If it will be a while before they can see you, I will be willing to adjust your meds and that would include considering lithium. ? ?

## 2022-01-02 ENCOUNTER — Encounter: Payer: Self-pay | Admitting: Family Medicine

## 2022-01-02 ENCOUNTER — Telehealth: Payer: Self-pay | Admitting: *Deleted

## 2022-01-02 DIAGNOSIS — F3162 Bipolar disorder, current episode mixed, moderate: Secondary | ICD-10-CM

## 2022-01-02 MED ORDER — LITHIUM CARBONATE 300 MG PO CAPS: 300.0000 mg | ORAL_CAPSULE | Freq: Two times a day (BID) | ORAL | 1 refills | Status: DC

## 2022-01-02 NOTE — Telephone Encounter (Signed)
Called: Has appointment with me next week.  Emphasized important to keep that appointment. ?Will address wrist problem at that appointment. ?Begun on low dose lithium.  Likely will need increase.  May need to adjust other meds.   ?

## 2022-01-02 NOTE — Assessment & Plan Note (Signed)
Overtreatment with his weight loss likely caused his syncope (as opposed to hypoglycemia.)  Stop amlodipine.  Encourage fluids.  FU 11 week.   ?

## 2022-01-02 NOTE — Telephone Encounter (Signed)
Pt calls for 2 reasons: ? ?He is unable to get a psychiatry visit until June 14.  He would like to discuss adding lithium in the meantime. ?He reports that his left hand a wrist are hurting him really bad and are weak.  He did not mention it at the appt because he was more worried about the mental health discussion. ? ? ?I will be happy to call and get him scheduled for his hand, but want to check with you first. Jone BasemanJessica Tanyah Debruyne, CMA ? ?

## 2022-01-02 NOTE — Assessment & Plan Note (Signed)
He variously describes his mental health issues as OCD, depression and bipolar.  Psych consult and counseling.   ?

## 2022-01-02 NOTE — Progress Notes (Signed)
? ? ?  SUBJECTIVE:  ? ?CHIEF COMPLAINT / HPI:  ? ?Patient carries the diagnosis of both OCD and bipolar.  Has had an acute decompensation in his mental health precipitated by his wife telling him that she wants a divorce.  This will affect two teen daughters, one normal and one with autism.  He is still in the home and no immediate plans to move out.   ?He does have thoughts of suicide but no active plan.  Feels he is safe.  He is protected by his concern for his daughters. ?He is drinking fluids well but his appetite has vanished.  He has lost 15 lbs in the past few weeks.  He had one episode of syncope that he though might be due to low blood sugar/not eating.   ?Wants to get set up with both counseling and with psych. ? ? ? ?OBJECTIVE:  ? ?BP 124/78   Pulse (!) 106   Wt 248 lb 6.4 oz (112.7 kg)   SpO2 98%   BMI 33.69 kg/m?   ?Wt and lowish blood pressure noted.   ?Affect: depressed ?Cardiac RRR without m or g ?Lungs clear ?Ext no edema. ? ?ASSESSMENT/PLAN:  ? ?Bipolar 1 disorder, mixed, moderate (HCC) ?No med adjustments during the visit.  Psychiatry referral.  I will only adjust meds if delay in psych consult. ?FU one week ?Update.  Cannot see psych until June 14.  We well start low dose lithium  See phone note. ? ?OCD (obsessive compulsive disorder) ?He variously describes his mental health issues as OCD, depression and bipolar.  Psych consult and counseling.   ? ?HTN (hypertension) ?Overtreatment with his weight loss likely caused his syncope (as opposed to hypoglycemia.)  Stop amlodipine.  Encourage fluids.  FU 11 week.   ?  ? ? ?Moses Manners, MD ?[facility]  ?

## 2022-01-02 NOTE — Assessment & Plan Note (Signed)
No med adjustments during the visit.  Psychiatry referral.  I will only adjust meds if delay in psych consult. ?FU one week ?Update.  Cannot see psych until June 14.  We well start low dose lithium  See phone note. ?

## 2022-01-08 ENCOUNTER — Other Ambulatory Visit: Payer: Self-pay | Admitting: Internal Medicine

## 2022-01-08 DIAGNOSIS — M450 Ankylosing spondylitis of multiple sites in spine: Secondary | ICD-10-CM

## 2022-01-08 DIAGNOSIS — Z79899 Other long term (current) drug therapy: Secondary | ICD-10-CM

## 2022-01-08 NOTE — Telephone Encounter (Signed)
Next Visit: 02/05/2022 ? ?Last Visit: 11/07/2021 ? ?Last Fill: 08/29/2021 ? ?PP:IRJJOACZYS spondylitis of multiple sites in spine  ? ?Current Dose per office note 11/07/2021:  Harriette Ohara ER 11 mg daily ? ?Labs: 11/07/2021 Blood count and cholesterol test show no problems. ? ?TB Gold: 07/02/2021 Neg   ? ?Okay to refill Harriette Ohara?  ?

## 2022-01-09 ENCOUNTER — Ambulatory Visit (INDEPENDENT_AMBULATORY_CARE_PROVIDER_SITE_OTHER): Payer: No Typology Code available for payment source | Admitting: Licensed Clinical Social Worker

## 2022-01-09 ENCOUNTER — Ambulatory Visit (INDEPENDENT_AMBULATORY_CARE_PROVIDER_SITE_OTHER): Payer: PRIVATE HEALTH INSURANCE | Admitting: Family Medicine

## 2022-01-09 DIAGNOSIS — F314 Bipolar disorder, current episode depressed, severe, without psychotic features: Secondary | ICD-10-CM | POA: Diagnosis not present

## 2022-01-09 DIAGNOSIS — F3162 Bipolar disorder, current episode mixed, moderate: Secondary | ICD-10-CM | POA: Diagnosis not present

## 2022-01-09 MED ORDER — QUETIAPINE FUMARATE 25 MG PO TABS: 25.0000 mg | ORAL_TABLET | Freq: Every day | ORAL | 3 refills | Status: DC

## 2022-01-09 MED ORDER — LITHIUM CARBONATE 300 MG PO CAPS: 300.0000 mg | ORAL_CAPSULE | Freq: Three times a day (TID) | ORAL | 1 refills | Status: DC

## 2022-01-09 NOTE — Plan of Care (Signed)
Edward [name] agrees with the Care Plan developed today. ? ? ?Problem: Depression ?Goal:  [name]'s will experience a decrease in his depression as evidenced by his PHQ-9 dropped from a 22 to a 4 or less by 07/11/2022 ?Outcome: Not Applicable ?  ?Problem: Self-harm ?Goal:  Edward [name] will not engage in self-harm 7/7 days per week based on self-report by 07/12/2022. ?Outcome: Not Applicable ?  ?

## 2022-01-09 NOTE — Patient Instructions (Addendum)
Stop the morning 25 mg seroquel now. ?After three days of being off the morning seroquel, increase your lithium to three times per day. ?After three days of being on the higher dose of the lithium, cut back the even seroquel to 25 mg. ?See me in 2 weeks.  I will plan a lithium level at that visit. ?I did not send in any new prescriptions assuming you have an adaquate supply.  Let me know if you need any new prescriptions.  ?
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
## 2022-01-10 ENCOUNTER — Encounter: Payer: Self-pay | Admitting: Family Medicine

## 2022-01-10 NOTE — Progress Notes (Signed)
? ? ?  SUBJECTIVE:  ? ?CHIEF COMPLAINT / HPI:  ? ?FU depression.  States he is eating better and his weight is up. ?Still overwhelmed but slightly less so.  Tolerating lithium well. ?Counseling starts next week.   ?No psychiatry visit until June.  That is why I am currently adjusting psych meds.  No SI or HI ? ? ? ?OBJECTIVE:  ? ?BP 130/86   Pulse 79   Ht 6' (1.829 m)   Wt 254 lb (115.2 kg)   SpO2 98%   BMI 34.45 kg/m?   ?Mildly sedated ?Cognition normal. ?Affect sad ?Engaged in discussion. ? ?ASSESSMENT/PLAN:  ? ?Bipolar 1 disorder, mixed, moderate (HCC) ?See AVS for schedule about weaning down on seroquel and increasing lithium   ?FU 2 weeks for BMP and lithium level. ?  ? ? ?Moses Manners, MD ?[facility]  ?

## 2022-01-10 NOTE — Assessment & Plan Note (Signed)
See AVS for schedule about weaning down on seroquel and increasing lithium   ?FU 2 weeks for BMP and lithium level. ?

## 2022-01-23 ENCOUNTER — Ambulatory Visit: Payer: PRIVATE HEALTH INSURANCE | Admitting: Family Medicine

## 2022-01-23 ENCOUNTER — Telehealth: Payer: Self-pay

## 2022-01-23 NOTE — Telephone Encounter (Signed)
2nd attempted to reach patient.  Patient called to see him he could change his in person appt to virtual. I called patient to inform him that Dr. Leveda Anna wanted him to still  keep his  in person 9:50 appt today. Patient didn't answer. LVM. Aquilla Solian, CMA ? ?

## 2022-01-26 ENCOUNTER — Encounter: Payer: Self-pay | Admitting: Family Medicine

## 2022-01-28 ENCOUNTER — Encounter: Payer: Self-pay | Admitting: Family Medicine

## 2022-01-28 ENCOUNTER — Ambulatory Visit (INDEPENDENT_AMBULATORY_CARE_PROVIDER_SITE_OTHER): Payer: PRIVATE HEALTH INSURANCE | Admitting: Family Medicine

## 2022-01-28 ENCOUNTER — Ambulatory Visit (HOSPITAL_COMMUNITY)
Admission: RE | Admit: 2022-01-28 | Discharge: 2022-01-28 | Disposition: A | Discharge status: Home / Self Care | Payer: PRIVATE HEALTH INSURANCE | Source: Ambulatory Visit | Attending: Family Medicine | Admitting: Family Medicine

## 2022-01-28 VITALS — BP 162/99 | HR 97 | Ht 66.0 in | Wt 252.1 lb

## 2022-01-28 DIAGNOSIS — R45851 Suicidal ideations: Secondary | ICD-10-CM | POA: Diagnosis not present

## 2022-01-28 DIAGNOSIS — M25562 Pain in left knee: Secondary | ICD-10-CM

## 2022-01-28 NOTE — Patient Instructions (Signed)
We are sending for x-rays today.  I would like for you to avoid weightbearing on that leg until you get back to see [place].  I want you make a follow-up with [place] in about 2 days.  In the meantime you can use Voltaren gel on the knee.  If you develop any fevers you to be seen immediately. ? ?I recommend scheduling a follow-up with your PCP in the next 1 to 2 weeks to discuss your concerns about your lithium dosing. ?

## 2022-01-28 NOTE — Progress Notes (Signed)
? ? ?  SUBJECTIVE:  ? ?CHIEF COMPLAINT / HPI:  ? ?Fall  knee pain: ?States that he was walking to work on Friday and attempted to pivot that his back leg did not pivot and he got a sharp pain.  He states he went to his workday but it started hurting so bad that he went home.  He states that yesterday, 2 days after the first injury he fell at home and landed on his knee.  States since then has been more swelling and more pain.  He denies any fevers.  States that it has felt a little bit warm since the swelling.  States that he is able to move the knee but it kind of hurts all over.  He is not on any blood thinners. ? ?PERTINENT  PMH / PSH: OA of both knees ? ?OBJECTIVE:  ? ?BP (!) 162/99   Pulse 97   Ht 5\' 6"  (1.676 m)   Wt 252 lb 2 oz (114.4 kg)   SpO2 100%   BMI 40.69 kg/m?   ? ?General: NAD, pleasant, able to participate in exam ?Respiratory: No respiratory distress ?MSK: Knee, left: Inspection negative for erythema or ecchymosis but he does have a joint effusion. No obvious bony abnormalities or signs of osteophyte development. Palpation yielded no asymmetric warmth but there is some generalized warmth of the knee in the setting of the effusion; he has positive joint line tenderness particular on the medial aspect.  He has some minor patellar tenderness.  No tenderness to palpation of the patellar and quadriceps tendon which seem to be intact.  He has limited range of motion in extension due to the leg swelling.  Strength testing limited due to patient discomfort.  Negative varus and valgus stress testing.  Negative anterior and posterior drawer testing.   ?Psych: Normal affect and mood ? ?ASSESSMENT/PLAN:  ? ?Left knee pain: ?In the setting of 2 different falls, one which was a twisting injury and 1 where the patient landed on his kneecap.  He has significant joint effusion with some warmth of that knee which I suspect is due to the injury as this just occurred.  He has no fevers.  No lesions of his foot to  suggest infection.  No erythema.  Strength testing is limited due to patient discomfort but he seems to have gross ability to flex and extend the knee.  He is got negative pain with palpation of patellar and quadriceps tendon.  He does have pain with medial joint line tenderness as well as patellar tenderness.  Varus valgus stress testing seem to be okay.  We will start the work-up with sending him for x-rays.  We will recommend that he is nonweightbearing until we get the results of the x-rays.  We will see him back in 2 days. ? ?Positive question #9 on PHQ: ?Patient states that he has a history of superficial cutting when he is stressed but has no actual suicidal intention and that he is currently seeing his PCP and being initiated on lithium for this.  He states that he wants to live and has no plans of harming himself.  Recommended continuing to follow-up with his primary doctor and seeking help if he develops any suicidal ideation. ? ?Jackelyn Poling, DO ?[facility]  ?

## 2022-01-30 ENCOUNTER — Ambulatory Visit: Payer: PRIVATE HEALTH INSURANCE | Admitting: Student

## 2022-01-30 ENCOUNTER — Encounter: Payer: Self-pay | Admitting: Family Medicine

## 2022-02-03 ENCOUNTER — Ambulatory Visit (INDEPENDENT_AMBULATORY_CARE_PROVIDER_SITE_OTHER): Payer: PRIVATE HEALTH INSURANCE | Admitting: Family Medicine

## 2022-02-03 ENCOUNTER — Encounter: Payer: Self-pay | Admitting: Family Medicine

## 2022-02-03 DIAGNOSIS — Z79899 Other long term (current) drug therapy: Secondary | ICD-10-CM

## 2022-02-03 DIAGNOSIS — F3162 Bipolar disorder, current episode mixed, moderate: Secondary | ICD-10-CM

## 2022-02-03 DIAGNOSIS — M170 Bilateral primary osteoarthritis of knee: Secondary | ICD-10-CM | POA: Diagnosis not present

## 2022-02-03 DIAGNOSIS — E669 Obesity, unspecified: Secondary | ICD-10-CM

## 2022-02-03 NOTE — Assessment & Plan Note (Signed)
Left knee injection for flare of osteoarthritis caused by mild trauma x 2. ?

## 2022-02-03 NOTE — Progress Notes (Signed)
? ? ?  SUBJECTIVE:  ? ?CHIEF COMPLAINT / HPI:  ? ?Two issues: ?Bipolar.  Doing very well on lithium.  Feels more stable/less labile.  Also feels more grounded.  Asks if I can increase the dose. ?Left knee pain.  Seen by Dr. Vanessa [place].  Known arthritis which apparently flared when he stepped wrong causing the knee to swell.  After being seen by Dr. Vanessa [place], he feel causing the knee to forcibly and fully flex, which caused another flare ?Morbid obesity.  Weight down from last visit  ? ? ? ?OBJECTIVE:  ? ?BP 126/70   Pulse 81   Ht 5\' 11"  (1.803 m)   Wt 248 lb 3.2 oz (112.6 kg)   SpO2 97%   BMI 34.62 kg/m?   ?Mood and cognition normal throughout. ?Left knee mild effusion. ?Discussed options of watchful waiting, PT referral and steroid shot.  He elected steroid shot. ?Informed consent and time out. ?Sterile prep ?Xylocaine 1% and 80 mg depo medrol injected into left knee without difficulty.  Patient tolerated the procedure well and left clinic in good condition.  ? ?ASSESSMENT/PLAN:  ? ?Bipolar 1 disorder, mixed, moderate (HCC) ?Now stabilizing on lithium and as he has some time and space since wife asked for separation. ? ?Lithium use ?Check TSH, BMP and lithium level.  Adjust dose based on level. ? ?Osteoarthritis of both knees ?Left knee injection for flare of osteoarthritis caused by mild trauma x 2. ? ?Obesity (BMI 30.0-34.9) ?Nice downward trend in weight.  No longer qualifies as morbid obesity. ?  ? ? ?Zenia Resides, MD ?[place]  ?

## 2022-02-03 NOTE — Assessment & Plan Note (Addendum)
Nice downward trend in weight.  No longer qualifies as morbid obesity. ?

## 2022-02-03 NOTE — Patient Instructions (Signed)
I will call with the blood test results. ?I hope you get a good response from the cortisone injection. ? ?

## 2022-02-03 NOTE — Assessment & Plan Note (Signed)
Check TSH, BMP and lithium level.  Adjust dose based on level. ?

## 2022-02-03 NOTE — Assessment & Plan Note (Signed)
Now stabilizing on lithium and as he has some time and space since wife asked for separation. ?

## 2022-02-04 LAB — LITHIUM LEVEL: Lithium Lvl: 0.8 mmol/L (ref 0.5–1.2)

## 2022-02-04 LAB — BASIC METABOLIC PANEL
BUN/Creatinine Ratio: 15 (ref 9–20)
BUN: 16 mg/dL (ref 6–24)
CO2: 25 mmol/L (ref 20–29)
Calcium: 9.1 mg/dL (ref 8.7–10.2)
Chloride: 104 mmol/L (ref 96–106)
Creatinine, Ser: 1.05 mg/dL (ref 0.76–1.27)
Glucose: 168 mg/dL — ABNORMAL HIGH (ref 70–99)
Potassium: 5.1 mmol/L (ref 3.5–5.2)
Sodium: 142 mmol/L (ref 134–144)
eGFR: 86 mL/min/{1.73_m2} (ref >59)

## 2022-02-04 LAB — TSH: TSH: 2.17 u[IU]/mL (ref 0.450–4.500)

## 2022-02-04 NOTE — Progress Notes (Deleted)
Office Visit Note  Patient: Edward DaneWilliam Hosang             Date of Birth: 12-12-1970           MRN: [id number]             PCP: Moses MannersHensel, Dewarren A, MD Referring: Moses MannersHensel, Canaan A, MD Visit Date: 02/05/2022   Subjective:  No chief complaint on file.   History of Present Illness: Edward [name] is a 51 y.o. male here for follow up for ankylosing spondyloarthritis on xeljanz 11 mg daily. He had recent labs 2 days ago showing normalization of renal function. ***   Previous HPI 11/07/21 Edward DaneWilliam Sainz is a 51 y.o. male here for follow up for joint pains with chronic ankylosing spondylitis follow up after starting xeljanz ER 11 mg PO daily.  He has noticed a significant improvement in his chronic back pain and stiffness particularly the nighttime pain and awakenings and early morning stiffness.  He continues having some peripheral joint pain problems overall he rates symptoms as 4 out of 10.  He notices this acting up a lot more with cold temperatures in the rainy weather.  Worst affected is his right hand and elbow he does experience some swelling around the right hand finger joints and numbness from the elbow down to the fourth and fifth finger in particular.  He has trouble fully closing his grip on the right.   Previous HPI 07/02/21 Edward DaneWilliam Carnero is a 51 y.o. male here for back pain and joint pains with history of ankylosing spondylitis.  He has a longstanding disease history with original diagnosis in late teenage and adult years.  Treatments have primarily consisted of chronic long-term use of oral NSAIDs switching between several different medications over time.  He has also had prior knee surgeries with problems from right worse than left osteoarthritis arthroscopy for injuries related to football and benign bone mass excision.  About 6 years ago was seen in rheumatology and recommended trial of DMARD treatment he took Humira and Enbrel.  He developed a significant pneumonia within 1 year of each  medication and discontinued these.  He never felt a huge improvement on either medicine.  He also continues to have intermittent exacerbations and development of impingement and radicular type pain down 1 extremity or another usually 1 side at a time and lasting for up to a few months.  At present he has more trouble on the right side in both upper and lower back and going down around the lateral hip.  He is having some symptom improvement with oral meloxicam and tramadol prescribed through his PCP office at low-dose has been a good benefit. He saw Dr. Sharmon RevereZiolkowska in 2019 but apparently not much follow up or ability to continue treatment at least in part due to change in insurance status. Interestingly his most recent x-ray studies did not report significant sacroiliitis changes in the lumbar spine and pelvis.  [facility] was reported consistent with inflammatory changes.     No Rheumatology ROS completed.   PMFS History:  Patient Active Problem List   Diagnosis Date Noted   Erectile dysfunction 09/09/2021   Colon cancer screening 06/10/2021   Severe episode of recurrent major depressive disorder, without psychotic features (HCC) 09/23/2019   Lithium use 02/08/2018   Healthcare maintenance 05/02/2016   Microalbuminuria 04/14/2016   Encounter for chronic pain management 04/10/2016   OCD (obsessive compulsive disorder) 04/10/2016   Vitamin  D deficiency 02/26/2016   Bipolar 1 disorder, mixed, moderate (HCC) 02/22/2016   Migraines 02/22/2016   Osteoarthritis of both knees 02/22/2016   Controlled substance agreement signed 12/13/2015   Obesity (BMI 30.0-34.9) 08/08/2015   HTN (hypertension) 08/08/2015   Diabetes mellitus type 2 in obese (HCC) 08/08/2015   Metabolic syndrome 08/08/2015   Hypertriglyceridemia 08/08/2015   Hypercholesteremia 08/08/2015   Ankylosing spondylitis (HCC) 08/08/2015   S/P gastric bypass 08/06/2015   Pain in joint of  right knee 07/08/2014    Past Medical History:  Diagnosis Date   Rheumatic fever age 51    Family History  Problem Relation Age of Onset   Hyperlipidemia Mother    Hypertension Mother    Breast cancer Mother    Thyroid disease Father    Early death Father 69       Cancer   Diabetes Father    Autoimmune disease Father        Ankylosing Spondylitis   Squamous cell carcinoma Father    Diabetes Brother    Hyperlipidemia Brother    Hypertension Brother    Thyroid disease Brother    Alcohol abuse Brother    Heart disease Maternal Grandmother    Stroke Maternal Grandmother    Heart disease Maternal Grandfather    Autoimmune disease Paternal Grandfather    Past Surgical History:  Procedure Laterality Date   arthroscopic surgery Left years ago   both knee cartlidge surgery     GASTRIC ROUX-EN-Y N/A 08/06/2015   Procedure: LAPAROSCOPIC ROUX-EN-Y GASTRIC BYPASS WITH UPPER ENDOSCOPY;  Surgeon: Gaynelle Adu, MD;  Location: [facility];  Service: General;  Laterality: N/A;   HIP SURGERY Left 1986   with knee surgery   KNEE SURGERY Right 1986   benign tumor with bone graft    UPPER GI ENDOSCOPY  08/06/2015   Procedure: UPPER GI ENDOSCOPY;  Surgeon: Gaynelle Adu, MD;  Location: [facility];  Service: General;;   Social History   Social History Narrative   02/22/16   Married: Doyne Keel   Children: Berline Lopes (anxiety/depression), [name] (Autism, non-verbal), Jacqlyn Larsen   Education: PhD in [employer]   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   Physical Exam   Musculoskeletal Exam: ***  CDAI Exam: CDAI Score: -- Patient Global: --; Provider Global: -- Swollen: --; Tender: -- Joint Exam 02/05/2022   No joint exam has been documented for this visit   There is currently no information documented on the homunculus. Go to the Rheumatology activity and complete the homunculus joint exam.  Investigation: No additional findings.  Imaging: DG Knee Complete 4 Views Left  Result Date: 01/29/2022 CLINICAL DATA:  Left knee pain and swelling.  Injury due to fall. EXAM: LEFT KNEE - COMPLETE 4+ VIEW COMPARISON:  None. FINDINGS: There is mild generalized edema at the level of the knee, small suprapatellar bursal effusion. There are mild features of degenerative arthrosis with preservation of the joint spaces but with small tricompartmental marginal osteophytes and with meniscal chondrocalcinosis of CPPD. No loose bodies, demineralization or fractures  are seen. There are no radiopaque foreign bodies. IMPRESSION: 1. Mild degenerative changes and meniscal chondrocalcinosis. 2. Small suprapatellar bursal effusion with mild soft tissue edema at the level of the knee. 3. No evidence of fractures. Electronically Signed   By: Almira Bar M.D.   On: 01/29/2022 20:26    Recent Labs: Lab Results  Component Value Date   WBC 13.0 (H) 11/07/2021   HGB 15.9 11/07/2021   PLT 235 11/07/2021   NA 142 02/03/2022   K 5.1 02/03/2022   CL 104 02/03/2022   CO2 25 02/03/2022   GLUCOSE 168 (H) 02/03/2022   BUN 16 02/03/2022   CREATININE 1.05 02/03/2022   BILITOT 0.5 11/07/2021   ALKPHOS 102 05/15/2021   AST 25 11/07/2021   ALT 13 11/07/2021   PROT 5.5 (L) 11/07/2021   ALBUMIN 3.8 (L) 05/15/2021   CALCIUM 9.1 02/03/2022   GFRAA 108 12/15/2019   QFTBGOLDPLUS NEGATIVE 07/02/2021    Speciality  Comments: No specialty comments available.  Procedures:  No procedures performed Allergies: Demerol [meperidine], Nsaids, and Tapentadol   Assessment / Plan:     Visit Diagnoses: No diagnosis found.  ***  Orders: No orders of the defined types were placed in this encounter.  No orders of the defined types were placed in this encounter.    Follow-Up Instructions: No follow-ups on file.   Fuller Plan, MD  Note - This record has been created using [employer].  Chart creation errors have been sought, but may not always  have been located. Such creation errors do not reflect on  the standard of medical care.

## 2022-02-05 ENCOUNTER — Ambulatory Visit: Payer: PRIVATE HEALTH INSURANCE | Admitting: Internal Medicine

## 2022-03-07 ENCOUNTER — Other Ambulatory Visit: Payer: Self-pay | Admitting: Family Medicine

## 2022-03-07 DIAGNOSIS — E1169 Type 2 diabetes mellitus with other specified complication: Secondary | ICD-10-CM

## 2022-03-11 ENCOUNTER — Encounter: Payer: Self-pay | Admitting: Family Medicine

## 2022-03-11 DIAGNOSIS — E1169 Type 2 diabetes mellitus with other specified complication: Secondary | ICD-10-CM

## 2022-03-11 DIAGNOSIS — F3162 Bipolar disorder, current episode mixed, moderate: Secondary | ICD-10-CM

## 2022-03-11 MED ORDER — DEXCOM G7 SENSOR MISC: 6 refills | Status: DC

## 2022-03-11 MED ORDER — DEXCOM G7 RECEIVER DEVI: 0 refills | Status: DC

## 2022-03-12 ENCOUNTER — Other Ambulatory Visit: Payer: Self-pay | Admitting: Family Medicine

## 2022-03-12 MED ORDER — LITHIUM CARBONATE 300 MG PO CAPS: 300.0000 mg | ORAL_CAPSULE | Freq: Two times a day (BID) | ORAL | 1 refills | Status: DC

## 2022-03-12 NOTE — Addendum Note (Signed)
Addended by: Zenia Resides on: 03/12/2022 08:58 AM   Modules accepted: Orders

## 2022-03-18 ENCOUNTER — Encounter: Payer: Self-pay | Admitting: *Deleted

## 2022-03-19 ENCOUNTER — Telehealth: Payer: Self-pay | Admitting: Pharmacist

## 2022-03-19 NOTE — Telephone Encounter (Signed)
Noted and agree. 

## 2022-03-19 NOTE — Telephone Encounter (Signed)
Contacted patient in follow-up of Dexcom G7 CGM transition.   Patient reports he is doing well and everything is functioning well.    He requests Lithium Refill sent to [facility].  He has appointment in near future but does not have enough supply until that visit. I agreed to alert Dr. Andria Frames of this request.

## 2022-03-26 ENCOUNTER — Ambulatory Visit (HOSPITAL_COMMUNITY): Payer: Self-pay | Admitting: Psychiatry

## 2022-03-31 ENCOUNTER — Encounter: Payer: Self-pay | Admitting: Family Medicine

## 2022-03-31 ENCOUNTER — Ambulatory Visit (INDEPENDENT_AMBULATORY_CARE_PROVIDER_SITE_OTHER): Payer: PRIVATE HEALTH INSURANCE | Admitting: Family Medicine

## 2022-03-31 VITALS — BP 81/67 | HR 75 | Ht 71.0 in | Wt 240.6 lb

## 2022-03-31 DIAGNOSIS — F332 Major depressive disorder, recurrent severe without psychotic features: Secondary | ICD-10-CM

## 2022-03-31 DIAGNOSIS — I10 Essential (primary) hypertension: Secondary | ICD-10-CM | POA: Diagnosis not present

## 2022-03-31 DIAGNOSIS — F3162 Bipolar disorder, current episode mixed, moderate: Secondary | ICD-10-CM

## 2022-03-31 DIAGNOSIS — E669 Obesity, unspecified: Secondary | ICD-10-CM | POA: Diagnosis not present

## 2022-03-31 DIAGNOSIS — E1169 Type 2 diabetes mellitus with other specified complication: Secondary | ICD-10-CM | POA: Diagnosis not present

## 2022-03-31 LAB — POCT GLYCOSYLATED HEMOGLOBIN (HGB A1C): HbA1c, POC (controlled diabetic range): 6.8 % (ref 0.0–7.0)

## 2022-03-31 MED ORDER — VENLAFAXINE HCL ER 75 MG PO CP24: 225.0000 mg | ORAL_CAPSULE | Freq: Every day | ORAL | 3 refills | Status: DC

## 2022-03-31 NOTE — Assessment & Plan Note (Signed)
Over treated.  Will DC amlodipine.  Follow home BPs.  May be able to decrease carvedilol.

## 2022-03-31 NOTE — Assessment & Plan Note (Signed)
Doing quite well.  No change.  Watch for hypoglycemia.  Continue diet and exercise.

## 2022-03-31 NOTE — Assessment & Plan Note (Signed)
Improved.  With concern about muscles, will DC seroquel.

## 2022-03-31 NOTE — Progress Notes (Signed)
    SUBJECTIVE:   CHIEF COMPLAINT / HPI:   Several issues: DM.  A1V at goal.  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  No hypoglycemia. Morbid obesity.  Excellent weight loss continues.  Continues to exercise  HBP.  On multiple meds.  With weight loss and today's low BP reading, time to decrease.  No reported lightheadedness Depression OK.  Still with some ups and downs.  Much better on lithium.  Concerned about tremor and a few muscle movements.      OBJECTIVE:   BP (!) 81/67   Pulse 75   Ht 5\' 11"  (1.803 m)   Wt 240 lb 9.6 oz (109.1 kg)   SpO2 100%   BMI 33.56 kg/m   Lungs clear Cardiac RRR without m or g Mood is upbeat  ASSESSMENT/PLAN:   HTN (hypertension) Over treated.  Will DC amlodipine.  Follow home BPs.  May be able to decrease carvedilol.  Diabetes mellitus type 2 in obese [facility]) Doing quite well.  No change.  Watch for hypoglycemia.  Continue diet and exercise.  Obesity (BMI 30.0-34.9) Great job with continued  Weight loss.    Severe episode of recurrent major depressive disorder, without psychotic features (HCC) Improved.  With concern about muscles, will DC seroquel.       [birth year]-14-1993, MD [facility]

## 2022-03-31 NOTE — Assessment & Plan Note (Signed)
Great job with continued  Weight loss.

## 2022-03-31 NOTE — Patient Instructions (Addendum)
Your A1C is at goal.  [place] job. Great job on the continued weight loss.   Stop the seroquel.   Discontinue the amlodipine.  Please check your blood pressure at home.  Also look to see if you can cut the carvedilol in half.  Decreasing the carvedilol is the next thing I would do.  I refilled the effexor.   You are due for the shingles vaccine, a covid booster, and a colonoscopy.  Let me know when I can order one or more of these for you.   See me in three months.

## 2022-04-01 ENCOUNTER — Ambulatory Visit (HOSPITAL_BASED_OUTPATIENT_CLINIC_OR_DEPARTMENT_OTHER): Payer: PRIVATE HEALTH INSURANCE | Admitting: Internal Medicine

## 2022-04-10 ENCOUNTER — Encounter: Payer: Self-pay | Admitting: Internal Medicine

## 2022-04-13 ENCOUNTER — Encounter: Payer: Self-pay | Admitting: Family Medicine

## 2022-04-13 DIAGNOSIS — F3162 Bipolar disorder, current episode mixed, moderate: Secondary | ICD-10-CM

## 2022-04-28 ENCOUNTER — Ambulatory Visit (INDEPENDENT_AMBULATORY_CARE_PROVIDER_SITE_OTHER): Payer: PRIVATE HEALTH INSURANCE | Admitting: Family Medicine

## 2022-04-28 ENCOUNTER — Encounter: Payer: Self-pay | Admitting: Family Medicine

## 2022-04-28 DIAGNOSIS — F3162 Bipolar disorder, current episode mixed, moderate: Secondary | ICD-10-CM | POA: Diagnosis not present

## 2022-04-28 DIAGNOSIS — E1169 Type 2 diabetes mellitus with other specified complication: Secondary | ICD-10-CM | POA: Diagnosis not present

## 2022-04-28 DIAGNOSIS — Z79899 Other long term (current) drug therapy: Secondary | ICD-10-CM | POA: Diagnosis not present

## 2022-04-28 DIAGNOSIS — M170 Bilateral primary osteoarthritis of knee: Secondary | ICD-10-CM

## 2022-04-28 DIAGNOSIS — G43001 Migraine without aura, not intractable, with status migrainosus: Secondary | ICD-10-CM

## 2022-04-28 DIAGNOSIS — E669 Obesity, unspecified: Secondary | ICD-10-CM

## 2022-04-28 DIAGNOSIS — N529 Male erectile dysfunction, unspecified: Secondary | ICD-10-CM

## 2022-04-28 DIAGNOSIS — M450 Ankylosing spondylitis of multiple sites in spine: Secondary | ICD-10-CM

## 2022-04-28 DIAGNOSIS — E7800 Pure hypercholesterolemia, unspecified: Secondary | ICD-10-CM

## 2022-04-28 DIAGNOSIS — M455 Ankylosing spondylitis of thoracolumbar region: Secondary | ICD-10-CM

## 2022-04-28 DIAGNOSIS — I10 Essential (primary) hypertension: Secondary | ICD-10-CM

## 2022-04-28 MED ORDER — METFORMIN HCL ER 500 MG PO TB24: 1000.0000 mg | ORAL_TABLET | Freq: Two times a day (BID) | ORAL | 3 refills | Status: DC

## 2022-04-28 MED ORDER — SILDENAFIL CITRATE 50 MG PO TABS: 50.0000 mg | ORAL_TABLET | Freq: Every day | ORAL | 3 refills | Status: DC | PRN

## 2022-04-28 MED ORDER — GABAPENTIN 800 MG PO TABS: 800.0000 mg | ORAL_TABLET | Freq: Three times a day (TID) | ORAL | 3 refills | Status: DC

## 2022-04-28 MED ORDER — TIZANIDINE HCL 4 MG PO TABS: 4.0000 mg | ORAL_TABLET | Freq: Four times a day (QID) | ORAL | 3 refills | Status: DC | PRN

## 2022-04-28 MED ORDER — ROSUVASTATIN CALCIUM 40 MG PO TABS: 40.0000 mg | ORAL_TABLET | Freq: Every day | ORAL | 3 refills | Status: DC

## 2022-04-28 MED ORDER — SUMATRIPTAN SUCCINATE 50 MG PO TABS: 50.0000 mg | ORAL_TABLET | ORAL | 5 refills | Status: DC | PRN

## 2022-04-28 MED ORDER — INSULIN GLARGINE 100 UNIT/ML SOLOSTAR PEN: 15.0000 [IU] | PEN_INJECTOR | Freq: Every day | SUBCUTANEOUS | 11 refills | Status: DC

## 2022-04-28 MED ORDER — TRAMADOL HCL 50 MG PO TABS: 50.0000 mg | ORAL_TABLET | Freq: Four times a day (QID) | ORAL | 5 refills | Status: DC | PRN

## 2022-04-28 MED ORDER — CLONAZEPAM 1 MG PO TABS: 1.0000 mg | ORAL_TABLET | Freq: Two times a day (BID) | ORAL | 1 refills | Status: DC | PRN

## 2022-04-28 NOTE — Patient Instructions (Addendum)
I will not change your BP medicine today.  If you have light headed spells, the next change I would make would be to decrease the carvedilol. If the diabetes goes great, the next step would be to decrease or stop the lantus insulin.   I refilled everything you asked, except the lithium.  Remind me to refill it tomorrow after I see the lithium level.   Good luck in [place].  I will be retired by the time you might get back to [place].

## 2022-04-29 ENCOUNTER — Encounter: Payer: Self-pay | Admitting: Family Medicine

## 2022-04-29 LAB — BASIC METABOLIC PANEL
BUN/Creatinine Ratio: 20 (ref 9–20)
BUN: 22 mg/dL (ref 6–24)
CO2: 25 mmol/L (ref 20–29)
Calcium: 9.2 mg/dL (ref 8.7–10.2)
Chloride: 105 mmol/L (ref 96–106)
Creatinine, Ser: 1.09 mg/dL (ref 0.76–1.27)
Glucose: 78 mg/dL (ref 70–99)
Potassium: 4.5 mmol/L (ref 3.5–5.2)
Sodium: 142 mmol/L (ref 134–144)
eGFR: 83 mL/min/{1.73_m2} (ref >59)

## 2022-04-29 LAB — LITHIUM LEVEL: Lithium Lvl: 0.2 mmol/L — ABNORMAL LOW (ref 0.5–1.2)

## 2022-04-29 MED ORDER — LITHIUM CARBONATE 300 MG PO CAPS: 300.0000 mg | ORAL_CAPSULE | Freq: Three times a day (TID) | ORAL | 3 refills | Status: DC

## 2022-04-29 NOTE — Progress Notes (Signed)
    SUBJECTIVE:   CHIEF COMPLAINT / HPI:   Multiple issues. Bipolar.  In correspondence, he asked about increasing the lithium dose.  Informed needs level and last level two months ago was normal.  Feels symptoms are reasonably well controled on current med regimen.  Also see problems below. Obesity.  Continue to lose weight nicely with a combo of semaglutide and markedly increased exercise.   Diabetes.  Control is good at this time.   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  With his divorce, this seems to be a positive move for him educationally and financially.  He is upbeat about it.  He will need three month scripts to allow him time to reestablish care in [place].   Hypertension.  No lightheadedness.  Feels better since HCTZ DCed last visit.     OBJECTIVE:   BP 97/73   Pulse 72   Ht 5\' 11"  (1.803 m)   Wt 235 lb 9.6 oz (106.9 kg)   SpO2 100%   BMI 32.86 kg/m   Low BP noted. Lungs clear Cardiac RRR without m or g  ASSESSMENT/PLAN:   Bipolar 1 disorder, mixed, moderate (HCC) Will increase lithium to tid due to documented low level.  Lithium use Level checked and low.  Renal function normal.  Obesity (BMI 30.0-34.9) Continues to improve.  Congratulated.  Wt loss had begun prior to semaglutide.  Semaglutide accelerated loss.    Diabetes mellitus type 2 in obese (HCC) Good control.  We discussed that if wt loss continues, next step would be to decrease or stop lantus.  HTN (hypertension) Meds decreased last visit.  May need further decrease.  Discussed that I would decrease carvedilol.  Continue lisinopril for renal protection.  He wants to wait until he establishes in 02-14-1992.      [place], MD [facility]

## 2022-04-29 NOTE — Assessment & Plan Note (Signed)
Will increase lithium to tid due to documented low level.

## 2022-04-29 NOTE — Assessment & Plan Note (Signed)
Meds decreased last visit.  May need further decrease.  Discussed that I would decrease carvedilol.  Continue lisinopril for renal protection.  He wants to wait until he establishes in [place].

## 2022-04-29 NOTE — Assessment & Plan Note (Signed)
Level checked and low.  Renal function normal.

## 2022-04-29 NOTE — Assessment & Plan Note (Signed)
Continues to improve.  Congratulated.  Wt loss had begun prior to semaglutide.  Semaglutide accelerated loss.

## 2022-04-29 NOTE — Assessment & Plan Note (Signed)
Good control.  We discussed that if wt loss continues, next step would be to decrease or stop lantus.

## 2022-06-03 IMAGING — CR DG KNEE COMPLETE 4+V*L*
5 series · 5 of 5 positions shown · non-contrast
Comparison: None.

CLINICAL DATA: Left knee pain and swelling.  Injury due to fall.

EXAM:
LEFT KNEE - COMPLETE 4+ VIEW

[knee ap]
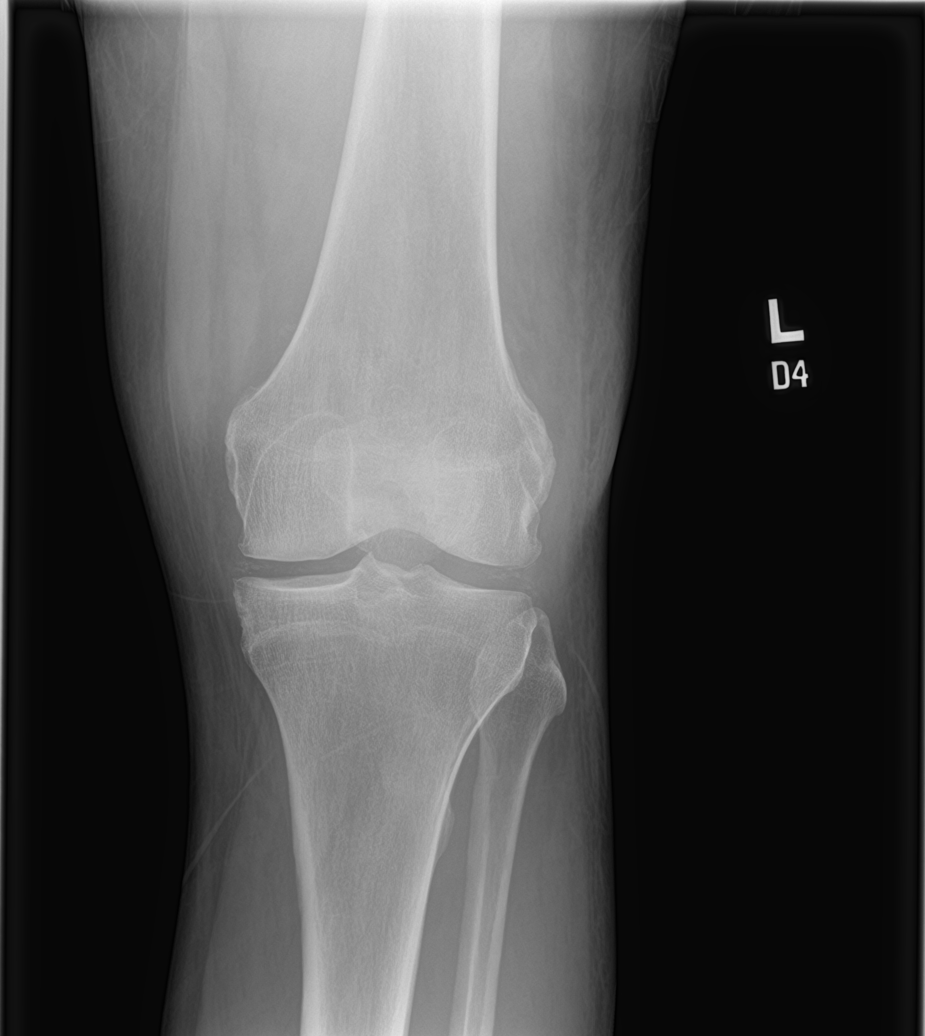

[knee lat]
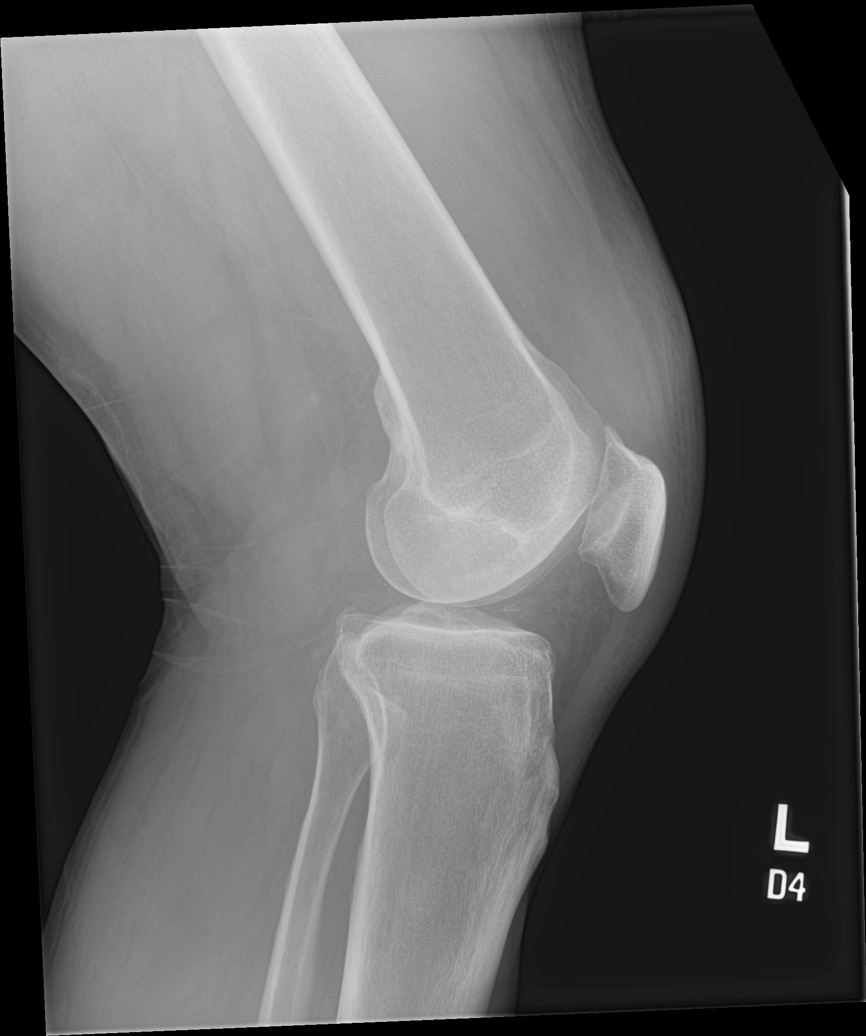

[knee sunrise]
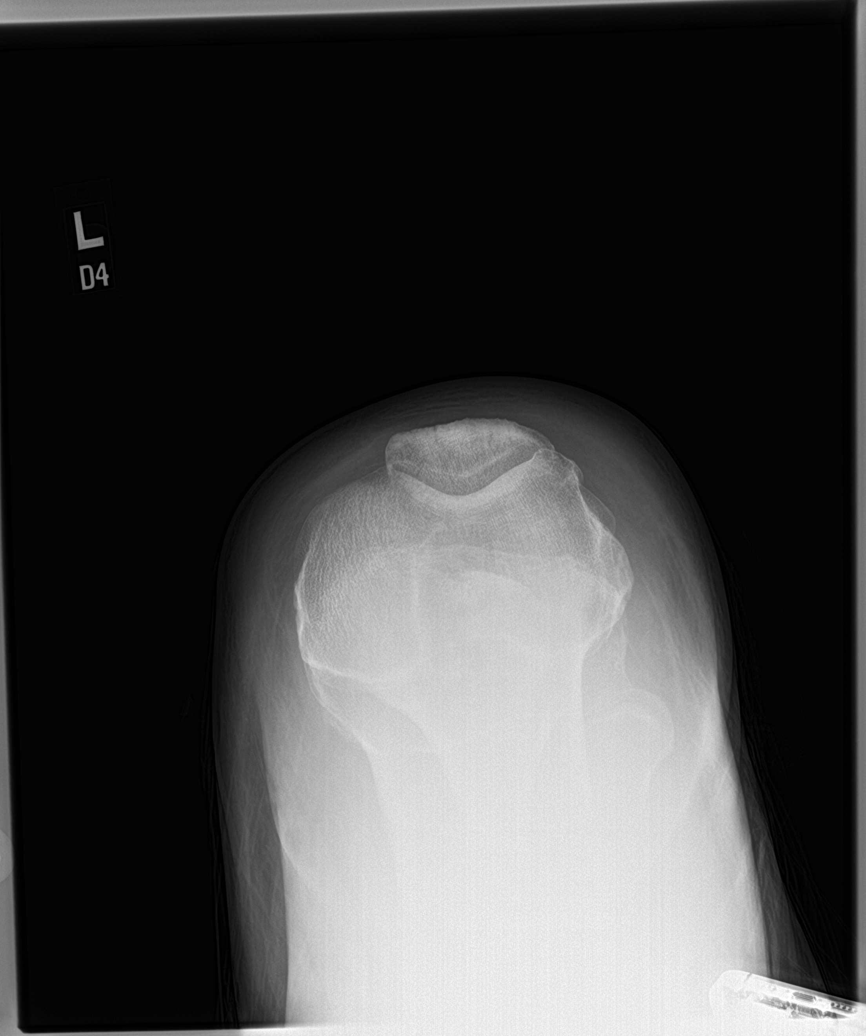

[knee obl (1 of 2)]
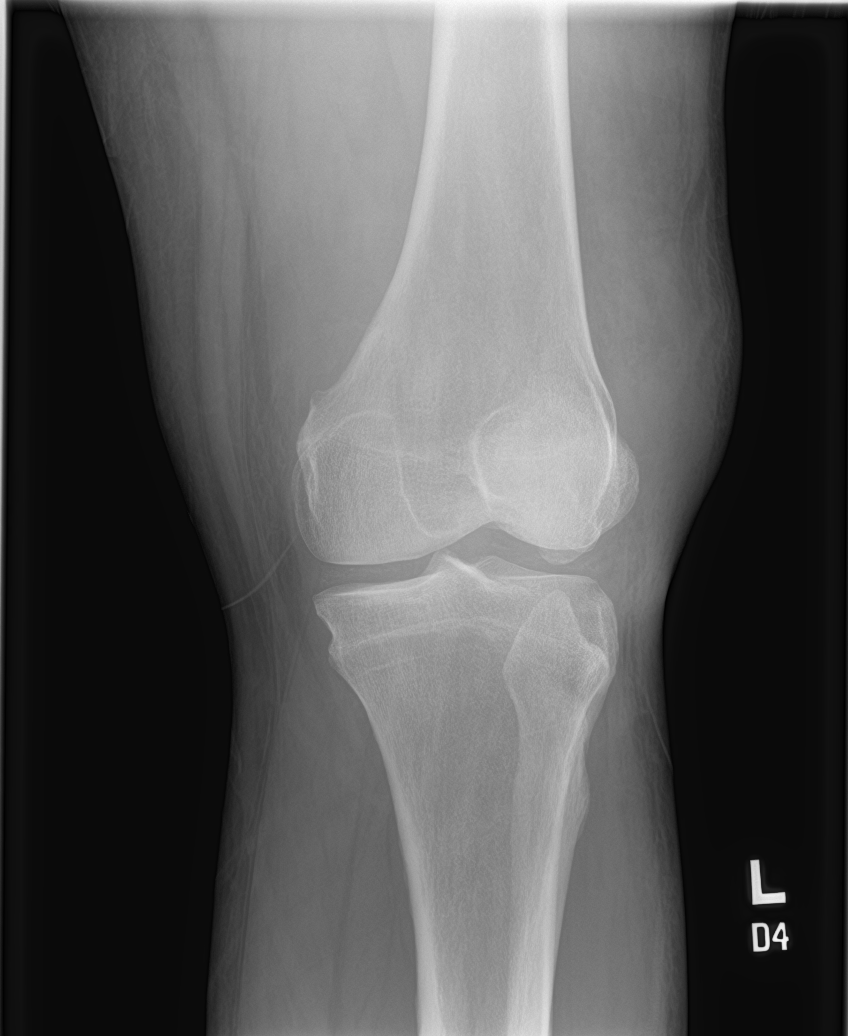

[knee obl (2 of 2)]
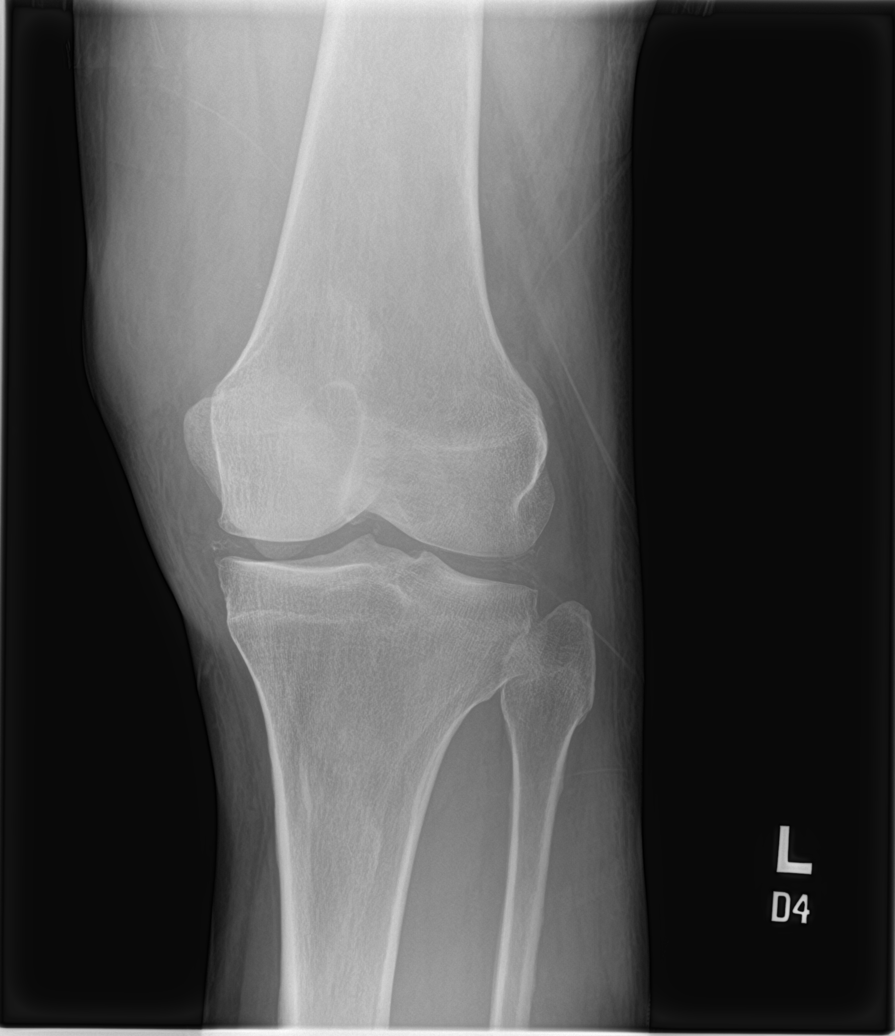

[5 of 5 positions shown; findings below may reference images not displayed]

FINDINGS: There is mild generalized edema at the level of the knee, small
suprapatellar bursal effusion.

There are mild features of degenerative arthrosis with preservation
of the joint spaces but with small tricompartmental marginal
osteophytes and with meniscal chondrocalcinosis of CPPD.

No loose bodies, demineralization or fractures are seen. There are
no radiopaque foreign bodies.
IMPRESSION: 1. Mild degenerative changes and meniscal chondrocalcinosis.
2. Small suprapatellar bursal effusion with mild soft tissue edema
at the level of the knee.
3. No evidence of fractures.

## 2022-06-16 ENCOUNTER — Other Ambulatory Visit: Payer: Self-pay | Admitting: Internal Medicine

## 2022-06-16 DIAGNOSIS — M450 Ankylosing spondylitis of multiple sites in spine: Secondary | ICD-10-CM

## 2022-06-16 DIAGNOSIS — Z79899 Other long term (current) drug therapy: Secondary | ICD-10-CM

## 2022-07-07 ENCOUNTER — Telehealth: Payer: Self-pay | Admitting: Internal Medicine

## 2022-07-07 DIAGNOSIS — M450 Ankylosing spondylitis of multiple sites in spine: Secondary | ICD-10-CM

## 2022-07-07 DIAGNOSIS — Z79899 Other long term (current) drug therapy: Secondary | ICD-10-CM

## 2022-07-07 NOTE — Telephone Encounter (Signed)
Patient is in [place] and scheduled an appointment for 07/31/22 when he returns to [facility].  Patient states he does not need a refill for Xeljanz.  The pharmacy automatically requested the refill, but he still has plenty of medication until his appointment in October.

## 2022-07-07 NOTE — Telephone Encounter (Signed)
Due ASAP. Message sent to the front to schedule.   Patient needs follow-up appointment and labs before refilling Xeljanz. Thanks!

## 2022-07-24 NOTE — Progress Notes (Deleted)
Office Visit Note  Patient: Edward [name]             Date of Birth: [birth year]           MRN: [id number]             PCP: Moses Manners, MD Referring: Moses Manners, MD Visit Date: 07/31/2022   Subjective:  No chief complaint on file.   History of Present Illness: Edward [name] is a 51 y.o. male here for follow up ***   Previous HPI 11/07/2021 Edward [name] is a 51 y.o. male here for follow up for joint pains with chronic ankylosing spondylitis follow up after starting xeljanz ER 11 mg PO daily.  He has noticed a significant improvement in his chronic back pain and stiffness particularly the nighttime pain and awakenings and early morning stiffness.  He continues having some peripheral joint pain problems overall he rates symptoms as 4 out of 10.  He notices this acting up a lot more with cold temperatures in the rainy weather.  Worst affected is his right hand and elbow he does experience some swelling around the right hand finger joints and numbness from the elbow down to the fourth and fifth finger in particular.  He has trouble fully closing his grip on the right.   Previous HPI 08/22/21 Edward [name] is a 51 y.o. male here for follow up for chronic joint pains in multiple sites with history of ankylosing spondylitis currently on NSAIDs. After our last visit findings were inconclusive so went for lumbar and sacral MRI. He continues having similar chronic low back pain and stiffness especially in the morning and at night. He also reports some worsening in hand pain and stiffness, sometimes extends up to the right elbow and shoulder.   Previous HPI 07/02/21 Edward [name] is a 51 y.o. male here for back pain and joint pains with history of ankylosing spondylitis.  He has a longstanding disease history with original diagnosis in late teenage and adult years.  Treatments have primarily consisted of chronic long-term use of oral NSAIDs switching between several different medications  over time.  He has also had prior knee surgeries with problems from right worse than left osteoarthritis arthroscopy for injuries related to football and benign bone mass excision.  About 6 years ago was seen in rheumatology and recommended trial of DMARD treatment he took Humira and Enbrel.  He developed a significant pneumonia within 1 year of each medication and discontinued these.  He never felt a huge improvement on either medicine.  He also continues to have intermittent exacerbations and development of impingement and radicular type pain down 1 extremity or another usually 1 side at a time and lasting for up to a few months.  At present he has more trouble on the right side in both upper and lower back and going down around the lateral hip.  He is having some symptom improvement with oral meloxicam and tramadol prescribed through his PCP office at low-dose has been a good benefit. He saw Dr. Sharmon Revere in 2019 but apparently not much follow up or ability to continue treatment at least in part due to change in insurance status. Interestingly his most recent x-ray studies did not report significant sacroiliitis changes in the lumbar spine and pelvis.  Prior imaging from May 2016 in [facility] was reported consistent with inflammatory changes.     No Rheumatology ROS completed.   PMFS History:  Patient Active Problem List  Diagnosis Date Noted   Erectile dysfunction 09/09/2021   Colon cancer screening 06/10/2021   Severe episode of recurrent major depressive disorder, without psychotic features ([place]) 09/23/2019   Lithium use 02/08/2018   Healthcare maintenance 05/02/2016   Microalbuminuria 04/14/2016   Encounter for chronic pain management 04/10/2016   OCD (obsessive compulsive disorder) 04/10/2016   Vitamin D deficiency 02/26/2016   Bipolar 1 disorder, mixed, moderate ([place]) 02/22/2016   Migraines 02/22/2016   Osteoarthritis of both knees 02/22/2016   Controlled substance  agreement signed 12/13/2015   Obesity (BMI 30.0-34.9) 08/08/2015   HTN (hypertension) 08/08/2015   Diabetes mellitus type 2 in obese ([place]) 94/85/4627   Metabolic syndrome 03/50/0938   Hypertriglyceridemia 08/08/2015   Hypercholesteremia 08/08/2015   Ankylosing spondylitis ([place]) 08/08/2015   S/P gastric bypass 08/06/2015   Pain in joint of right knee 07/08/2014    Past Medical History:  Diagnosis Date   Rheumatic fever age 62    Family History  Problem Relation Age of Onset   Hyperlipidemia Mother    Hypertension Mother    Breast cancer Mother    Thyroid disease Father    Early death Father 85       Cancer   Diabetes Father    Autoimmune disease Father        Ankylosing Spondylitis   Squamous cell carcinoma Father    Diabetes Brother    Hyperlipidemia Brother    Hypertension Brother    Thyroid disease Brother    Alcohol abuse Brother    Heart disease Maternal Grandmother    Stroke Maternal Grandmother    Heart disease Maternal Grandfather    Autoimmune disease Paternal Grandfather    Past Surgical History:  Procedure Laterality Date   arthroscopic surgery Left years ago   both knee cartlidge surgery     GASTRIC ROUX-EN-Y N/A 08/06/2015   Procedure: LAPAROSCOPIC ROUX-EN-Y GASTRIC BYPASS WITH UPPER ENDOSCOPY;  Surgeon: Greer Pickerel, MD;  Location: [facility];  Service: General;  Laterality: N/A;   HIP SURGERY Left 1986   with knee surgery   KNEE SURGERY Right 1986   benign tumor with bone graft    UPPER GI ENDOSCOPY  08/06/2015   Procedure: UPPER GI ENDOSCOPY;  Surgeon: Greer Pickerel, MD;  Location: [facility];  Service: General;;   Social History   Social History Narrative   02/22/16   Married: [name]   Children: [name] (anxiety/depression), [name] (Autism, non-verbal), [name]   Education: PhD in [employer]   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   Physical Exam   Musculoskeletal Exam: ***  CDAI Exam: CDAI Score: -- Patient Global: --; Provider Global: -- Swollen: --; Tender: -- Joint Exam 07/31/2022   No joint exam has been documented for this visit   There is currently no information documented on the homunculus. Go to the Rheumatology activity and complete the homunculus joint exam.  Investigation: No additional findings.  Imaging: No results found.  Recent Labs: Lab Results  Component Value Date   WBC 13.0 (H) 11/07/2021  HGB 15.9 11/07/2021   PLT 235 11/07/2021   NA 142 04/28/2022   K 4.5 04/28/2022   CL 105 04/28/2022   CO2 25 04/28/2022   GLUCOSE 78 04/28/2022   BUN 22 04/28/2022   CREATININE 1.09 04/28/2022   BILITOT 0.5 11/07/2021   ALKPHOS 102 05/15/2021   AST 25 11/07/2021   ALT 13 11/07/2021   PROT 5.5 (L) 11/07/2021   ALBUMIN 3.8 (L) 05/15/2021   CALCIUM 9.2 04/28/2022   GFRAA 108 12/15/2019   QFTBGOLDPLUS NEGATIVE 07/02/2021    Speciality Comments: No specialty comments available.  Procedures:  No procedures performed Allergies: Demerol [meperidine], Nsaids, and Tapentadol   Assessment / Plan:     Visit Diagnoses: No diagnosis found.  ***  Orders: No orders of the defined types were placed in this encounter.  No orders of the defined types were placed in this encounter.    Follow-Up Instructions: No follow-ups on file.   Jairo Ben, RT  Note - This record has been created using [employer].  Chart creation errors have been sought, but may not always  have been located. Such creation errors do not reflect on  the standard of medical care.

## 2022-07-25 NOTE — Telephone Encounter (Signed)
Opened in error

## 2022-07-31 ENCOUNTER — Ambulatory Visit: Payer: PRIVATE HEALTH INSURANCE | Attending: Internal Medicine | Admitting: Internal Medicine

## 2022-07-31 ENCOUNTER — Ambulatory Visit: Payer: PRIVATE HEALTH INSURANCE | Admitting: Family Medicine

## 2022-07-31 DIAGNOSIS — M450 Ankylosing spondylitis of multiple sites in spine: Secondary | ICD-10-CM

## 2022-07-31 DIAGNOSIS — M25521 Pain in right elbow: Secondary | ICD-10-CM

## 2022-07-31 DIAGNOSIS — M79641 Pain in right hand: Secondary | ICD-10-CM

## 2022-07-31 DIAGNOSIS — Z79899 Other long term (current) drug therapy: Secondary | ICD-10-CM

## 2022-08-22 ENCOUNTER — Encounter: Payer: Self-pay | Admitting: Family Medicine

## 2022-08-22 DIAGNOSIS — E1169 Type 2 diabetes mellitus with other specified complication: Secondary | ICD-10-CM

## 2022-08-25 MED ORDER — DEXCOM G7 SENSOR MISC: 6 refills | Status: DC

## 2022-08-25 MED ORDER — OZEMPIC (2 MG/DOSE) 8 MG/3ML ~~LOC~~ SOPN: 2.0000 mg | PEN_INJECTOR | SUBCUTANEOUS | 6 refills | Status: DC

## 2022-09-08 ENCOUNTER — Telehealth: Payer: Self-pay

## 2022-09-08 ENCOUNTER — Other Ambulatory Visit (HOSPITAL_COMMUNITY): Payer: Self-pay

## 2022-09-08 NOTE — Telephone Encounter (Signed)
I am unclear about providing care since he is now living in [place].  Apparently plans to see me in Dec.  Since prior auth denied since I have not tried the preferred drug, trulicity.  Likely do trulicity Rx if sees me in Dec.

## 2022-09-08 NOTE — Telephone Encounter (Signed)
Prior Auth for patients medication OZEMPIC denied by [employer] via CoverMyMeds.   Reason: NOTHING TO SUPPORT THAT PATIENT HAS HAD FAILURE, CONTRAINDICATION, OR INTOLERANCE TO COVERED ALTERNATIVE TRULICITY.   FULL DENIAL SCANNED TO PT'S MEDIA    CoverMyMeds Key: [id number]

## 2022-09-08 NOTE — Telephone Encounter (Signed)
A Prior Authorization was initiated for this patients OZEMPIC through CoverMyMeds.   Key: [id number]

## 2022-10-27 ENCOUNTER — Encounter: Payer: Self-pay | Admitting: Family Medicine

## 2022-10-27 ENCOUNTER — Other Ambulatory Visit: Payer: Self-pay | Admitting: Internal Medicine

## 2022-10-27 DIAGNOSIS — M450 Ankylosing spondylitis of multiple sites in spine: Secondary | ICD-10-CM

## 2022-10-27 DIAGNOSIS — Z79899 Other long term (current) drug therapy: Secondary | ICD-10-CM

## 2022-11-07 ENCOUNTER — Ambulatory Visit: Payer: PRIVATE HEALTH INSURANCE | Admitting: Family Medicine

## 2022-11-18 ENCOUNTER — Encounter: Payer: Self-pay | Admitting: Family Medicine

## 2022-11-24 ENCOUNTER — Ambulatory Visit: Payer: PRIVATE HEALTH INSURANCE | Admitting: Family Medicine

## 2022-12-08 ENCOUNTER — Encounter: Payer: Self-pay | Admitting: Family Medicine

## 2022-12-09 ENCOUNTER — Other Ambulatory Visit: Payer: Self-pay | Admitting: Family Medicine

## 2022-12-09 DIAGNOSIS — E1169 Type 2 diabetes mellitus with other specified complication: Secondary | ICD-10-CM

## 2022-12-09 MED ORDER — OZEMPIC (2 MG/DOSE) 8 MG/3ML ~~LOC~~ SOPN: 2.0000 mg | PEN_INJECTOR | SUBCUTANEOUS | 6 refills | Status: DC

## 2022-12-09 MED ORDER — DEXCOM G7 SENSOR MISC: 6 refills | Status: DC

## 2022-12-12 ENCOUNTER — Other Ambulatory Visit (HOSPITAL_COMMUNITY): Payer: Self-pay

## 2022-12-15 ENCOUNTER — Other Ambulatory Visit: Payer: Self-pay | Admitting: Family Medicine

## 2022-12-15 ENCOUNTER — Telehealth: Payer: Self-pay

## 2022-12-15 ENCOUNTER — Other Ambulatory Visit (HOSPITAL_COMMUNITY): Payer: Self-pay

## 2022-12-15 DIAGNOSIS — E1169 Type 2 diabetes mellitus with other specified complication: Secondary | ICD-10-CM

## 2022-12-15 MED ORDER — EMPAGLIFLOZIN 25 MG PO TABS: 25.0000 mg | ORAL_TABLET | Freq: Every day | ORAL | 3 refills | Status: DC

## 2022-12-15 MED ORDER — CARVEDILOL 12.5 MG PO TABS: 12.5000 mg | ORAL_TABLET | Freq: Two times a day (BID) | ORAL | 3 refills | Status: DC

## 2022-12-15 NOTE — Telephone Encounter (Signed)
A Prior Authorization was initiated AND APPROVED for this patients ozempic '2mg'$  dose pens through CoverMyMeds.   Key: Kevan Ny

## 2022-12-24 ENCOUNTER — Telehealth: Payer: Self-pay

## 2022-12-24 NOTE — Telephone Encounter (Signed)
A Prior Authorization was initiated for this patients DEXCOM G7 SENSOR through CoverMyMeds.   Key: [id number]

## 2022-12-31 NOTE — Telephone Encounter (Signed)
Prior Auth for patients medication DEXCOM G7 SENSORS approved by [employer] from 12/24/22 to 12/29/23.  CoverMyMeds Key: [id number] PA Case ID #: [id number]

## 2023-01-12 ENCOUNTER — Encounter: Payer: Self-pay | Admitting: Family Medicine

## 2023-01-12 ENCOUNTER — Ambulatory Visit (INDEPENDENT_AMBULATORY_CARE_PROVIDER_SITE_OTHER): Payer: 59 | Admitting: Family Medicine

## 2023-01-12 VITALS — BP 138/85 | HR 87 | Temp 97.7°F | Ht 71.0 in | Wt 253.4 lb

## 2023-01-12 DIAGNOSIS — M7989 Other specified soft tissue disorders: Secondary | ICD-10-CM | POA: Insufficient documentation

## 2023-01-12 DIAGNOSIS — G43001 Migraine without aura, not intractable, with status migrainosus: Secondary | ICD-10-CM

## 2023-01-12 DIAGNOSIS — Z79899 Other long term (current) drug therapy: Secondary | ICD-10-CM | POA: Diagnosis not present

## 2023-01-12 DIAGNOSIS — E1169 Type 2 diabetes mellitus with other specified complication: Secondary | ICD-10-CM

## 2023-01-12 DIAGNOSIS — M455 Ankylosing spondylitis of thoracolumbar region: Secondary | ICD-10-CM

## 2023-01-12 DIAGNOSIS — F3162 Bipolar disorder, current episode mixed, moderate: Secondary | ICD-10-CM

## 2023-01-12 DIAGNOSIS — E1165 Type 2 diabetes mellitus with hyperglycemia: Secondary | ICD-10-CM

## 2023-01-12 DIAGNOSIS — M450 Ankylosing spondylitis of multiple sites in spine: Secondary | ICD-10-CM

## 2023-01-12 DIAGNOSIS — E669 Obesity, unspecified: Secondary | ICD-10-CM

## 2023-01-12 DIAGNOSIS — I10 Essential (primary) hypertension: Secondary | ICD-10-CM

## 2023-01-12 DIAGNOSIS — M170 Bilateral primary osteoarthritis of knee: Secondary | ICD-10-CM

## 2023-01-12 LAB — POCT GLYCOSYLATED HEMOGLOBIN (HGB A1C): HbA1c, POC (controlled diabetic range): 8.4 % — AB (ref 0.0–7.0)

## 2023-01-12 MED ORDER — VENLAFAXINE HCL ER 75 MG PO CP24
225.0000 mg | ORAL_CAPSULE | Freq: Every day | ORAL | 3 refills | Status: DC
Start: 2023-01-12 — End: 2024-03-21

## 2023-01-12 MED ORDER — CLONAZEPAM 1 MG PO TABS
1.0000 mg | ORAL_TABLET | Freq: Two times a day (BID) | ORAL | 0 refills | Status: DC | PRN
Start: 2023-01-12 — End: 2024-03-21

## 2023-01-12 MED ORDER — TIZANIDINE HCL 4 MG PO TABS
4.0000 mg | ORAL_TABLET | Freq: Four times a day (QID) | ORAL | 1 refills | Status: AC | PRN
Start: 2023-01-12 — End: 2023-03-13

## 2023-01-12 MED ORDER — TRAMADOL HCL 50 MG PO TABS
50.0000 mg | ORAL_TABLET | Freq: Four times a day (QID) | ORAL | 0 refills | Status: DC | PRN
Start: 2023-01-12 — End: 2024-05-25

## 2023-01-12 NOTE — Assessment & Plan Note (Signed)
Will get lab work today to rule out other causes Reassuringly no cardiac symptoms Does have cardiology referral pending in [place], but he will be here until June- pending lab work consider ECHO here

## 2023-01-12 NOTE — Assessment & Plan Note (Signed)
At goal, continue home medications

## 2023-01-12 NOTE — Progress Notes (Signed)
    SUBJECTIVE:   CHIEF COMPLAINT / HPI:   T2DM- last A1c 6.8 in June 2023. Taking Jardiance 25 mg, metformin 1000mg  BID, Semalgutide 2 mg weekly.  Leg swelling- started several months ago, saw PCP in [place] who did not do labs and put him on lasix 20mg  daily and daily potassium and sent a referral to cardiology. Open to getting labs today. No dyspnea, shortness of breath, chest pain, orthopnea. He notes his swelling today is improved from prior.  HTN- on lisinopril 5mg  daily and Coreg 12.5 mg BID  HLD- on rosuvastatin 40 mg daily and zetia 10mg  daily.   ED- on viagra 50mg  PRN  Bipolar 1 disorder- was on lithium managed and started by previous PCP Dr Andria Frames. Weaned off of lithium and hasn't been on for two months.Still taking venlafaxine 225mg  daily, Klonopin 1mg  BID PRN anxiety (last filled 04/2022).   Ankylosing spondylitis- was on Xejanz XR, followed by rheumatology but couldn't follow up regularly so was unable to get refills.   Chronic pain- on tramadol 50mg  q6hr PRN (last filled 04/2022). Tizanidine 4mg  PRN muscle spasms. Restart meloxicam 7.5 mg with PCP in [place] and takes on a full stomach.   PERTINENT  PMH / PSH: T2DM, HTN, migraines, ankylosing spondylitis, OA knees, bipolar type 1, ED, HLD, OCD, s/p gastric bypass  OBJECTIVE:   BP 138/85   Pulse 87   Temp 97.7 F (36.5 C)   Ht 5\' 11"  (1.803 m)   Wt 253 lb 6.4 oz (114.9 kg)   SpO2 96%   BMI 35.34 kg/m   General: A&O, NAD HEENT: No sign of trauma, EOM grossly intact Cardiac: RRR, no m/r/g, 1+ symmetric pitting edema to knees bilaterally Respiratory: CTAB, normal WOB, no w/c/r GI: Soft, NTTP, non-distended  Extremities: NTTP, no peripheral edema. Neuro: Normal gait, moves all four extremities appropriately. Psych: Appropriate mood and affect   ASSESSMENT/PLAN:   Bipolar 1 disorder, mixed, moderate (HCC) No longer on lithium, discussed will continue venlafaxine and Klonopin for now Consider psychiatry  referral versus lamictal vs other antipsychotic Would not increase benzodiazepine dose at this time due multiple other medications, he is in agreement with this today  Ankylosing spondylitis ([place]) On tramadol, meloxicam, tizanidine - PDMP reviewed and appropriate Would be better if he could get back on Xejanz with rheumatology and wean these medications  HTN (hypertension) At goal, continue home medications  Leg swelling Will get lab work today to rule out other causes Reassuringly no cardiac symptoms Does have cardiology referral pending in [place], but he will be here until June- pending lab work consider ECHO here  Type 2 diabetes mellitus with obesity Continue home medications, A1c 8.4 today, has been cooking at home and improving diet Urine microalbumin today, continue ACEI/SGLT2 for renal protection     Lenoria Chime, MD [place]

## 2023-01-12 NOTE — Assessment & Plan Note (Signed)
On tramadol, meloxicam, tizanidine - PDMP reviewed and appropriate Would be better if he could get back on Xejanz with rheumatology and wean these medications

## 2023-01-12 NOTE — Patient Instructions (Signed)
It was wonderful to see you today.  Please bring ALL of your medications with you to every visit.   Today we talked about:  I will refill your medications later today and sent to your pharmacy We have sent some lab work for your leg swelling, I will call/message with results  Thank you for choosing [facility].   Please call [phone] with any questions about today's appointment.  Please arrive at least 15 minutes prior to your scheduled appointments.   If you had blood work today, I will send you a MyChart message or a letter if results are normal. Otherwise, I will give you a call.   If you had a referral placed, they will call you to set up an appointment. Please give [place] a call if you don't hear back in the next 2 weeks.   If you need additional refills before your next appointment, please call your pharmacy first.   Yehuda Savannah, MD  Family Medicine

## 2023-01-12 NOTE — Assessment & Plan Note (Signed)
No longer on lithium, discussed will continue venlafaxine and Klonopin for now Consider psychiatry referral versus lamictal vs other antipsychotic Would not increase benzodiazepine dose at this time due multiple other medications, he is in agreement with this today

## 2023-01-12 NOTE — Assessment & Plan Note (Signed)
Continue home medications, A1c 8.4 today, has been cooking at home and improving diet Urine microalbumin today, continue ACEI/SGLT2 for renal protection

## 2023-01-13 ENCOUNTER — Encounter: Payer: Self-pay | Admitting: Family Medicine

## 2023-01-13 ENCOUNTER — Telehealth: Payer: Self-pay | Admitting: Family Medicine

## 2023-01-13 DIAGNOSIS — M7989 Other specified soft tissue disorders: Secondary | ICD-10-CM

## 2023-01-13 DIAGNOSIS — R808 Other proteinuria: Secondary | ICD-10-CM | POA: Insufficient documentation

## 2023-01-13 LAB — CBC
Hematocrit: 48.8 % (ref 37.5–51.0)
Hemoglobin: 15.7 g/dL (ref 13.0–17.7)
MCH: 27.4 pg (ref 26.6–33.0)
MCHC: 32.2 g/dL (ref 31.5–35.7)
MCV: 85 fL (ref 79–97)
Platelets: 305 10*3/uL (ref 150–450)
RBC: 5.73 x10E6/uL (ref 4.14–5.80)
RDW: 15.4 % (ref 11.6–15.4)
WBC: 6.1 10*3/uL (ref 3.4–10.8)

## 2023-01-13 LAB — BASIC METABOLIC PANEL
BUN/Creatinine Ratio: 18 (ref 9–20)
BUN: 21 mg/dL (ref 6–24)
CO2: 25 mmol/L (ref 20–29)
Calcium: 9.1 mg/dL (ref 8.7–10.2)
Chloride: 104 mmol/L (ref 96–106)
Creatinine, Ser: 1.15 mg/dL (ref 0.76–1.27)
Glucose: 119 mg/dL — ABNORMAL HIGH (ref 70–99)
Potassium: 4.5 mmol/L (ref 3.5–5.2)
Sodium: 139 mmol/L (ref 134–144)
eGFR: 77 mL/min/{1.73_m2} (ref >59)

## 2023-01-13 LAB — MICROALBUMIN / CREATININE URINE RATIO
Creatinine, Urine: 52.7 mg/dL
Microalb/Creat Ratio: 5780 mg/g creat — ABNORMAL HIGH (ref 0–29)
Microalbumin, Urine: 3046.1 ug/mL

## 2023-01-13 LAB — LIPID PANEL
Chol/HDL Ratio: 4.5 ratio (ref 0.0–5.0)
Cholesterol, Total: 230 mg/dL — ABNORMAL HIGH (ref 100–199)
HDL: 51 mg/dL (ref >39)
LDL Chol Calc (NIH): 160 mg/dL — ABNORMAL HIGH (ref 0–99)
Triglycerides: 105 mg/dL (ref 0–149)
VLDL Cholesterol Cal: 19 mg/dL (ref 5–40)

## 2023-01-13 LAB — TSH RFX ON ABNORMAL TO FREE T4: TSH: 1.48 u[IU]/mL (ref 0.450–4.500)

## 2023-01-13 LAB — BRAIN NATRIURETIC PEPTIDE: BNP: 47.5 pg/mL (ref 0.0–100.0)

## 2023-01-13 NOTE — Telephone Encounter (Signed)
Left VM to patient and left clinic number to call back. Will send MyChart message as well.  Urine test showing large amount of proteinuria, concern for nephrotic syndrome causing his leg swelling.  If patient calls back - Please let him know I would like him to come give another urine sample at clinic to recheck total protein - I have also ordered a renal US that I would like him to have and would like to refer to nephrology - Please ask him good call back times  Yehuda Savannah MD

## 2023-01-14 ENCOUNTER — Telehealth: Payer: Self-pay | Admitting: Family Medicine

## 2023-01-14 DIAGNOSIS — R809 Proteinuria, unspecified: Secondary | ICD-10-CM

## 2023-01-14 DIAGNOSIS — I10 Essential (primary) hypertension: Secondary | ICD-10-CM

## 2023-01-14 DIAGNOSIS — E7800 Pure hypercholesterolemia, unspecified: Secondary | ICD-10-CM

## 2023-01-14 MED ORDER — ROSUVASTATIN CALCIUM 40 MG PO TABS: 40.0000 mg | ORAL_TABLET | Freq: Every day | ORAL | 3 refills | Status: DC

## 2023-01-14 MED ORDER — EZETIMIBE 10 MG PO TABS: 10.0000 mg | ORAL_TABLET | Freq: Every day | ORAL | 3 refills | Status: DC

## 2023-01-14 MED ORDER — LISINOPRIL 5 MG PO TABS: 5.0000 mg | ORAL_TABLET | Freq: Every day | ORAL | 3 refills | Status: DC

## 2023-01-14 NOTE — Telephone Encounter (Signed)
Called patient and discussed lab work. Discussed our lab will be closed by 5 pm today but I think it is appropriate for him to see his PCP in [place] in the next week or so to recheck labs, kidney ultrasound, and get into see nephrology. He is in agreement with this- will send myChart message with recommended labs and imaging.  Discussed my concern for nephrotic syndrome, potential causes, and potential risks of this (VTE, edema, kidney damage/failure, etc).   Refills sent to [facility] for rosuvastatin and Zetia, he had not been taking Zetia as he wasn't sure he was supposed to continue.   Answered all questions and concerns.  Yehuda Savannah MD

## 2023-01-18 ENCOUNTER — Other Ambulatory Visit: Payer: Self-pay | Admitting: Family Medicine

## 2023-01-18 DIAGNOSIS — G43001 Migraine without aura, not intractable, with status migrainosus: Secondary | ICD-10-CM

## 2023-01-18 DIAGNOSIS — F3162 Bipolar disorder, current episode mixed, moderate: Secondary | ICD-10-CM

## 2023-01-19 NOTE — Telephone Encounter (Signed)
This was refilled on 4/1 by Dr. Miquel Dunn. Can you please confirm if patient needs this sent somewhere else or if this is a duplicate?  Terisa Starr, MD  Family Medicine Teaching Service

## 2023-03-17 ENCOUNTER — Encounter: Payer: Self-pay | Admitting: Family Medicine

## 2023-03-17 ENCOUNTER — Other Ambulatory Visit: Payer: Self-pay | Admitting: Family Medicine

## 2023-03-17 DIAGNOSIS — R808 Other proteinuria: Secondary | ICD-10-CM

## 2023-03-17 NOTE — Progress Notes (Signed)
Referralto nephrolgoy placed as pt is back in town. Labs future ordered including:  Protein creatinine ratio HIV, RPR, Hepatitis B serologies, hepatitis C serologies, ANA   Renal US ordered, waiting to hear for patient time/schedule preference and will have CMA call to schedule.

## 2023-03-18 ENCOUNTER — Ambulatory Visit: Payer: 59 | Admitting: Pharmacist

## 2023-03-30 ENCOUNTER — Telehealth (HOSPITAL_BASED_OUTPATIENT_CLINIC_OR_DEPARTMENT_OTHER): Payer: Self-pay

## 2023-03-30 ENCOUNTER — Ambulatory Visit (HOSPITAL_BASED_OUTPATIENT_CLINIC_OR_DEPARTMENT_OTHER): Admission: RE | Admit: 2023-03-30 | Payer: PRIVATE HEALTH INSURANCE | Source: Ambulatory Visit

## 2023-04-09 NOTE — Progress Notes (Deleted)
    SUBJECTIVE:   CHIEF COMPLAINT / HPI:   Lab swelling with microalbuminuria- here for follow up, getting UPC and other blood work today. Renal US was scheduled for 03/30/23 - did pt go?? Nephrology referral has been placed- has he been scheduled for an appt yet?Marland Kitchen Denies chest pain, shortness of breath, or orthopnea. On ACEI and SGLT2. Was previously on lithium and stopped early 2024.  T2DM- on Jardiance 25mg , metformin 1000mg  BID, Semaglutide 2mg  weekly.   Ankylosing spondylitis- tramadol, meloxicam, tizanidine. No longer than [place] Sell as he is not following with rheumatology anymore.   Bipolar 1 disorder- only on venlafaxine and Klonopin currently, does not follow with psychiatry, has weined himself off of lithium   HLD- on rosuvastatin 40mg  and Zetia 10mg . Consider lipid clinic referral.   PERTINENT  PMH / PSH: T2DM, s/p gastric bypass, OCD, HLD, HTN, bipolar 1 disorder, ankylosing spondylitis  OBJECTIVE:   There were no vitals taken for this visit.  General: A&O, NAD HEENT: No sign of trauma, EOM grossly intact Cardiac: RRR, no m/r/g Respiratory: CTAB, normal WOB, no w/c/r GI: Soft, NTTP, non-distended  Extremities: NTTP, no peripheral edema. Neuro: Normal gait, moves all four extremities appropriately. Psych: Appropriate mood and affect   ASSESSMENT/PLAN:   No problem-specific Assessment & Plan notes found for this encounter.     Billey Co, MD [facility]

## 2023-04-10 ENCOUNTER — Ambulatory Visit: Payer: 59 | Admitting: Family Medicine

## 2023-08-26 ENCOUNTER — Other Ambulatory Visit: Payer: Self-pay | Admitting: Family Medicine

## 2023-08-26 DIAGNOSIS — M170 Bilateral primary osteoarthritis of knee: Secondary | ICD-10-CM

## 2023-08-26 DIAGNOSIS — M450 Ankylosing spondylitis of multiple sites in spine: Secondary | ICD-10-CM

## 2023-08-26 DIAGNOSIS — M455 Ankylosing spondylitis of thoracolumbar region: Secondary | ICD-10-CM

## 2023-08-28 ENCOUNTER — Other Ambulatory Visit: Payer: Self-pay

## 2023-08-28 DIAGNOSIS — F3162 Bipolar disorder, current episode mixed, moderate: Secondary | ICD-10-CM

## 2023-08-28 DIAGNOSIS — M450 Ankylosing spondylitis of multiple sites in spine: Secondary | ICD-10-CM

## 2023-08-28 DIAGNOSIS — M170 Bilateral primary osteoarthritis of knee: Secondary | ICD-10-CM

## 2023-08-28 DIAGNOSIS — G43001 Migraine without aura, not intractable, with status migrainosus: Secondary | ICD-10-CM

## 2023-08-28 DIAGNOSIS — M455 Ankylosing spondylitis of thoracolumbar region: Secondary | ICD-10-CM

## 2023-08-28 NOTE — Telephone Encounter (Signed)
Covering for Dr. Miquel Dunn. I am not able to prescribe medications to patient when he is in another state, especially controlled/sedating medications. I do not have a [employer].  If he is living in [place] the majority of the time would strongly recommend he gets a PCP there to manage his medications.  Dr. Miquel Dunn will be back on Tuesday - I will leave this request in her box so she is aware this is my response.  Latrelle Dodrill, MD

## 2023-08-31 ENCOUNTER — Other Ambulatory Visit: Payer: Self-pay

## 2023-08-31 DIAGNOSIS — M455 Ankylosing spondylitis of thoracolumbar region: Secondary | ICD-10-CM

## 2023-08-31 DIAGNOSIS — M450 Ankylosing spondylitis of multiple sites in spine: Secondary | ICD-10-CM

## 2023-09-01 ENCOUNTER — Other Ambulatory Visit: Payer: Self-pay | Admitting: Family Medicine

## 2023-09-01 NOTE — Progress Notes (Signed)
Refills for multiple controlled medications declined. PDMP has not filled in [place] since Nov 2023, pt saw me once in April 2024, and was given one month supply. I have not refilled these medications since and June follow up appointment was missed and no further appts scheduled.  Message sent to team to call patient- cannot prescribe controlled substances as has not been seen and needs consistent follow up and further appt to discuss as I have not seen or refilled since April 2024 and it is now November and pt noted he is not in [place] yet. I do not have any records from [place] provider if these medications have been continued.  Hi Team,  I noted [place] note that patient is still in [place]. Please call and let patient know I cannot refill controlled substances without an appointment while patient is living in another state. I recommend that he makes an appointment with his [place] PCP or gets refills from PCP that he has in [place] that he has been seeing regularly as I have not refilled these medications since April 2024 and am uncomfortable refilling further as he has not followed up and I have no idea how often he is taking. I have openings Dec 2, Dec 16, Jan 2, Jan 3rd so multiple clinic options before his scheduled appt, and if he is not in [place] before then needs to get refills from provider seeing him and prescribing there.   Burley Saver MD

## 2023-10-15 ENCOUNTER — Ambulatory Visit: Payer: PRIVATE HEALTH INSURANCE | Admitting: Family Medicine

## 2023-10-15 NOTE — Progress Notes (Deleted)
    SUBJECTIVE:   CHIEF COMPLAINT / HPI:   Last PDMP shows no meds filled in [place] since 08/2022. I last filled one month supply Klonopin /tramadol  in April 2024.   [facility] [place] [phone] - called and confirmed with pharmacy that Klonopin  and tramadol  have not been filled since April 2024 by myself.   Flu/COVID vaccine  T2DM-   PERTINENT  PMH / PSH: T2DM, s/p gastric bypass, OCD, HTN, HLD, chronic pain management, bipolar 1 disorder, ankylosing spondylitis  OBJECTIVE:   There were no vitals taken for this visit.  General: A&O, NAD HEENT: No sign of trauma, EOM grossly intact Cardiac: RRR, no m/r/g Respiratory: CTAB, normal WOB, no w/c/r GI: Soft, NTTP, non-distended  Extremities: NTTP, no peripheral edema. Neuro: Normal gait, moves all four extremities appropriately. Psych: Appropriate mood and affect   ASSESSMENT/PLAN:   Assessment & Plan Type 2 diabetes mellitus with obesity (HCC)      Rollene FORBES Keeling, MD [facility]

## 2023-10-20 ENCOUNTER — Ambulatory Visit: Payer: PRIVATE HEALTH INSURANCE | Admitting: Family Medicine

## 2024-02-11 DIAGNOSIS — I639 Cerebral infarction, unspecified: Secondary | ICD-10-CM

## 2024-02-11 HISTORY — DX: Cerebral infarction, unspecified: I63.9

## 2024-03-21 ENCOUNTER — Ambulatory Visit: Payer: PRIVATE HEALTH INSURANCE | Admitting: Family Medicine

## 2024-03-21 ENCOUNTER — Encounter: Payer: Self-pay | Admitting: Family Medicine

## 2024-03-21 VITALS — BP 146/94 | HR 88 | Wt 302.8 lb

## 2024-03-21 DIAGNOSIS — E669 Obesity, unspecified: Secondary | ICD-10-CM

## 2024-03-21 DIAGNOSIS — N049 Nephrotic syndrome with unspecified morphologic changes: Secondary | ICD-10-CM

## 2024-03-21 DIAGNOSIS — Z8719 Personal history of other diseases of the digestive system: Secondary | ICD-10-CM

## 2024-03-21 DIAGNOSIS — E1169 Type 2 diabetes mellitus with other specified complication: Secondary | ICD-10-CM

## 2024-03-21 DIAGNOSIS — M450 Ankylosing spondylitis of multiple sites in spine: Secondary | ICD-10-CM

## 2024-03-21 DIAGNOSIS — I10 Essential (primary) hypertension: Secondary | ICD-10-CM

## 2024-03-21 DIAGNOSIS — F3162 Bipolar disorder, current episode mixed, moderate: Secondary | ICD-10-CM

## 2024-03-21 DIAGNOSIS — E7800 Pure hypercholesterolemia, unspecified: Secondary | ICD-10-CM

## 2024-03-21 MED ORDER — PANTOPRAZOLE SODIUM 40 MG PO TBEC: 40.0000 mg | DELAYED_RELEASE_TABLET | Freq: Two times a day (BID) | ORAL | 3 refills | Status: DC

## 2024-03-21 MED ORDER — FERROUS SULFATE 325 (65 FE) MG PO TABS: 325.0000 mg | ORAL_TABLET | Freq: Every day | ORAL | 1 refills | Status: DC

## 2024-03-21 NOTE — Assessment & Plan Note (Signed)
 Discsused BMP today to check Cr, consider pain control options pending GFR

## 2024-03-21 NOTE — Patient Instructions (Signed)
 It was wonderful to see you today.  Please bring ALL of your medications with you to every visit.   Today we talked about:  We are checking a bunch of labs for your kidneys and history of GI bleed. I will reach out when these get back and we can discuss pain control going forward.  I have placed a referral to psychiatry to discuss medications for your mood!  I have placed a nephrology and GI referral as well.  Thank you for choosing [facility].   Please call [phone] with any questions about today's appointment.  Please arrive at least 15 minutes prior to your scheduled appointments.   If you had blood work today, I will send you a MyChart message or a letter if results are normal. Otherwise, I will give you a call.   If you had a referral placed, they will call you to set up an appointment. Please give us  a call if you don't hear back in the next 2 weeks.   If you need additional refills before your next appointment, please call your pharmacy first.   Avanell Bob, MD  Family Medicine

## 2024-03-21 NOTE — Assessment & Plan Note (Signed)
 Not only any chronic medications, has tried multiple in the past  Referral to psychiatry for medication management

## 2024-03-21 NOTE — Assessment & Plan Note (Signed)
 Checking lipid panel today, no longer on any medications

## 2024-03-21 NOTE — Progress Notes (Signed)
 SUBJECTIVE:   CHIEF COMPLAINT / HPI:  Hospitalized in [place]  01/31/24 with melena and for upper GI bleed and chronic NSAID use causing AKI. Endocscopy 02/01/24 showed large anastomotic ulcer, BID PPI started, 4 units pRBCs received, recommended GI follow up in 3-4 months for repeat endoscipy. Had AKI, renal ultrasound was unremarkable. Hgb 02/29/2024 was 8.2, started on iron supplement. Cr was 2.53 eGFR 30, Hgb 10.5, BNP 67 (normal), A1c 7.0 on 02/29/24. Recommended GI follow up August/Sept 2025 and nephrology follow up as well. He denies black stools or blood in stool.   Leg swelling, Proteinuria, concern for nephrotic syndrome- on lasix 20 mg tab daily. BNP was normal in [place]  in May. Weight 301 lbs on 02/29/2024 in [place] .  Labs I would like to get: Urine protein creatinine ratio. HIV, RPR, Hepatitis B serologies, hepatitis C serologies, ANA Renal ultrasound - was normal in hospital in [place]  T2DM- no home meds, A1c was 7.0 on 02/29/24 per hospital records.  HTN - carvedilol  25 mg BID. BP elevated on repeat today.   HLD- no meds currently  CKD stage 3b- last Cr 2.53 during follow up from hospital stay. Was thought to be AKI from NSAID use but pt also previously had proteinuria and suspected nephrotic syndrome. Lasix 20mg  daily started by PCP in [place]  but only for 2 weeks and he has not been taking. Denies shortness of breath or chest pain. He notes he is still urinating well multiple times per day.  Ankylosing spondylitis- was on Xejanz XR, followed by rheumatology but couldn't follow up regularly so was unable to get refills. Has been taking tramadol  was refilled by PCP in [place]  03/14/2024 but I had not refilled since on time visit in April 2024 in [place]. He would like to wait on referral to nephrology  Bipolar 1 disorder- previously was on lithium  with Dr Bronson Canny and was discontinued. Was also previously on Clonazepam  but has not been refilled in a while. Taking some since  he left the hospital. Wi  PERTINENT  PMH / PSH: bipolar disorder type 1, T2DM, HTN, s/p gastric bypass, ho GI bleed in March 2025 due to ulcer from NSAID use  OBJECTIVE:   BP (!) 146/94   Pulse 88   Wt (!) 302 lb 12.8 oz (137.3 kg)   SpO2 97%   BMI 42.23 kg/m   General: A&O, NAD HEENT: No sign of trauma, EOM grossly intact Cardiac: RRR, no m/r/g Respiratory: CTAB, normal WOB, no w/c/r GI: Soft, NTTP, non-distended  Extremities: NTTP, 3+ pitting edema to mid shin bilaterally Neuro: Normal gait, moves all four extremities appropriately. Psych: Appropriate mood and affect   ASSESSMENT/PLAN:   Assessment & Plan History of GI bleed No melena or hematochezia, check CBC today, refilled PO ferrous sulfate and checking iron studies From anastomatic ulcer with his previous gastric sleeve, referral to GI today for repeat endoscopy, and also needs screening colonoscopy. Refilled BID PPI Nephrotic syndrome Per hospital records in [place] , will do urine protein creatinine ratio today as well as labs for possible causes  Referral to nephrology today  Had normal renal US  per hospital dc summary in [place] Bipolar 1 disorder, mixed, moderate (HCC) Not only any chronic medications, has tried multiple in the past  Referral to psychiatry for medication management Type 2 diabetes mellitus with obesity (HCC) Recent A1c 7.0, checking lipid panel today, no longer on any medications Hypercholesteremia Checking lipid panel today, no longer on any medications Primary hypertension Not at goal today,  on Coreg  25 mg BID, pending repeat Cr will add additional agent, avoiding amlodipine  as he already has significant leg swelling Ankylosing spondylitis of multiple sites in spine (HCC) Discsused BMP today to check Cr, consider pain control options pending GFR   Total patient care 61 minutes   Charmel Cooter, MD [facility]

## 2024-03-21 NOTE — Assessment & Plan Note (Signed)
 No melena or hematochezia, check CBC today, refilled PO ferrous sulfate and checking iron studies From anastomatic ulcer with his previous gastric sleeve, referral to GI today for repeat endoscopy, and also needs screening colonoscopy. Refilled BID PPI

## 2024-03-21 NOTE — Assessment & Plan Note (Signed)
 Recent A1c 7.0, checking lipid panel today, no longer on any medications

## 2024-03-21 NOTE — Assessment & Plan Note (Signed)
 Not at goal today, on Coreg  25 mg BID, pending repeat Cr will add additional agent, avoiding amlodipine  as he already has significant leg swelling

## 2024-03-22 ENCOUNTER — Ambulatory Visit: Payer: Self-pay | Admitting: Family Medicine

## 2024-03-22 DIAGNOSIS — N049 Nephrotic syndrome with unspecified morphologic changes: Secondary | ICD-10-CM

## 2024-03-22 LAB — CBC
Hematocrit: 36.2 % — ABNORMAL LOW (ref 37.5–51.0)
Hemoglobin: 11.3 g/dL — ABNORMAL LOW (ref 13.0–17.7)
MCH: 29 pg (ref 26.6–33.0)
MCHC: 31.2 g/dL — ABNORMAL LOW (ref 31.5–35.7)
MCV: 93 fL (ref 79–97)
Platelets: 338 10*3/uL (ref 150–450)
RBC: 3.89 x10E6/uL — ABNORMAL LOW (ref 4.14–5.80)
RDW: 14.2 % (ref 11.6–15.4)
WBC: 6.9 10*3/uL (ref 3.4–10.8)

## 2024-03-22 LAB — RENAL FUNCTION PANEL
Albumin: 3.1 g/dL — ABNORMAL LOW (ref 3.8–4.9)
BUN/Creatinine Ratio: 13 (ref 9–20)
BUN: 34 mg/dL — ABNORMAL HIGH (ref 6–24)
CO2: 16 mmol/L — ABNORMAL LOW (ref 20–29)
Calcium: 8.7 mg/dL (ref 8.7–10.2)
Chloride: 110 mmol/L — ABNORMAL HIGH (ref 96–106)
Creatinine, Ser: 2.69 mg/dL — ABNORMAL HIGH (ref 0.76–1.27)
Glucose: 126 mg/dL — ABNORMAL HIGH (ref 70–99)
Phosphorus: 4.1 mg/dL (ref 2.8–4.1)
Potassium: 4.9 mmol/L (ref 3.5–5.2)
Sodium: 141 mmol/L (ref 134–144)
eGFR: 28 mL/min/{1.73_m2} — ABNORMAL LOW (ref >59)

## 2024-03-22 LAB — HCV AB W REFLEX TO QUANT PCR: HCV Ab: NONREACTIVE

## 2024-03-22 LAB — HIV ANTIBODY (ROUTINE TESTING W REFLEX): HIV Screen 4th Generation wRfx: NONREACTIVE

## 2024-03-22 LAB — LIPID PANEL
Chol/HDL Ratio: 7.7 ratio — ABNORMAL HIGH (ref 0.0–5.0)
Cholesterol, Total: 294 mg/dL — ABNORMAL HIGH (ref 100–199)
HDL: 38 mg/dL — ABNORMAL LOW (ref >39)
LDL Chol Calc (NIH): 217 mg/dL — ABNORMAL HIGH (ref 0–99)
Triglycerides: 200 mg/dL — ABNORMAL HIGH (ref 0–149)
VLDL Cholesterol Cal: 39 mg/dL (ref 5–40)

## 2024-03-22 LAB — IRON,TIBC AND FERRITIN PANEL
Ferritin: 60 ng/mL (ref 30–400)
Iron Saturation: 17 % (ref 15–55)
Iron: 52 ug/dL (ref 38–169)
Total Iron Binding Capacity: 298 ug/dL (ref 250–450)
UIBC: 246 ug/dL (ref 111–343)

## 2024-03-22 LAB — HEPATITIS B SURFACE ANTIBODY, QUANTITATIVE: Hepatitis B Surf Ab Quant: 4.9 m[IU]/mL — ABNORMAL LOW

## 2024-03-22 LAB — HEPATITIS B CORE ANTIBODY, TOTAL: Hep B Core Total Ab: NEGATIVE

## 2024-03-22 LAB — HEPATITIS B SURFACE ANTIGEN: Hepatitis B Surface Ag: NEGATIVE

## 2024-03-22 LAB — RPR: RPR Ser Ql: NONREACTIVE

## 2024-03-22 LAB — HCV INTERPRETATION

## 2024-03-22 LAB — ANA: Anti Nuclear Antibody (ANA): NEGATIVE

## 2024-03-22 MED ORDER — FUROSEMIDE 20 MG PO TABS: 20.0000 mg | ORAL_TABLET | Freq: Every day | ORAL | 0 refills | Status: DC

## 2024-03-22 MED ORDER — SODIUM BICARBONATE 650 MG PO TABS: 650.0000 mg | ORAL_TABLET | Freq: Two times a day (BID) | ORAL | 1 refills | Status: DC

## 2024-03-24 ENCOUNTER — Other Ambulatory Visit: Payer: Self-pay | Admitting: Family Medicine

## 2024-03-24 DIAGNOSIS — N049 Nephrotic syndrome with unspecified morphologic changes: Secondary | ICD-10-CM

## 2024-03-24 LAB — PROTEIN / CREATININE RATIO, URINE
Creatinine, Urine: 84.7 mg/dL
Protein, Ur: 1177.9 mg/dL
Protein/Creat Ratio: 13907 mg/g{creat} — ABNORMAL HIGH (ref 0–200)

## 2024-03-24 LAB — MICROALBUMIN / CREATININE URINE RATIO
Microalb/Creat Ratio: 7367 mg/g{creat} — ABNORMAL HIGH (ref 0–29)
Microalbumin, Urine: 6239.7 ug/mL

## 2024-03-24 NOTE — Progress Notes (Signed)
Future BMP ordered

## 2024-03-29 ENCOUNTER — Other Ambulatory Visit: Payer: PRIVATE HEALTH INSURANCE

## 2024-03-29 DIAGNOSIS — N049 Nephrotic syndrome with unspecified morphologic changes: Secondary | ICD-10-CM

## 2024-03-30 ENCOUNTER — Telehealth: Payer: Self-pay | Admitting: Family Medicine

## 2024-03-30 ENCOUNTER — Ambulatory Visit: Payer: Self-pay | Admitting: Family Medicine

## 2024-03-30 DIAGNOSIS — N049 Nephrotic syndrome with unspecified morphologic changes: Secondary | ICD-10-CM

## 2024-03-30 LAB — BASIC METABOLIC PANEL WITH GFR
BUN/Creatinine Ratio: 12 (ref 9–20)
BUN: 36 mg/dL — ABNORMAL HIGH (ref 6–24)
CO2: 18 mmol/L — ABNORMAL LOW (ref 20–29)
Calcium: 8.5 mg/dL — ABNORMAL LOW (ref 8.7–10.2)
Chloride: 108 mmol/L — ABNORMAL HIGH (ref 96–106)
Creatinine, Ser: 3.03 mg/dL — ABNORMAL HIGH (ref 0.76–1.27)
Glucose: 141 mg/dL — ABNORMAL HIGH (ref 70–99)
Potassium: 5 mmol/L (ref 3.5–5.2)
Sodium: 142 mmol/L (ref 134–144)
eGFR: 24 mL/min/{1.73_m2} — ABNORMAL LOW (ref >59)

## 2024-03-30 MED ORDER — FUROSEMIDE 40 MG PO TABS: 40.0000 mg | ORAL_TABLET | Freq: Every day | ORAL | 0 refills | Status: DC

## 2024-03-30 NOTE — Telephone Encounter (Signed)
 Called and spoke with Edward [name] with nephrology about patients increasing creatinine, 3+ pitting edema on exam last week, and recommendations for diuretic dosing. He recommended lasix  40mg  BID x 3 days, and then 40mg  daily after that, repeat labs next week. Due to borderline high potassium does not recommend potassium supplementation. He notes they see referral and are working to get him in. Greatly appreciate nephrology team.  Called and spoke with Edward [name]. He notes he has taken lasix  20 mg daily x 4 days and swelling is going down some and he is feeling more mobile. Discussed recommendation for lasix  40 mg twice daily for 3 days, then decrease to 40 mg daily. Recommended repeat lab work in 1 week, appointment scheduled for next week lab only visit, future BMP ordered. Recommended follow up appointment to reassess fluid status and discuss anxiety/pain control, he notes he is checking on his insurance and then will schedule. Answered all questions and concerns.   Edward Bob MD

## 2024-03-30 NOTE — Progress Notes (Signed)
 See phone note, called and discusesd with nephrology and then patient.  Increase lasix  to 40mg  BID x 3 days, then 40 mg daily after, repeat BMP in 1 week.  Avanell Bob MD

## 2024-03-30 NOTE — Telephone Encounter (Signed)
 Patient returns call to nurse line.   Results discussed with patient. Advised PCP sent a mychart message as well with more details.   He reports he has taken 4 doses of Lasix  so far. He reports improvement with leg swelling. He denies any shortness of breath or chest pains.   He reports he has not heard from Nephrology.   Advised will send him a mychart message with office details. He will call to make an apt.

## 2024-03-30 NOTE — Telephone Encounter (Signed)
 Left HIPAA compliant VM for patient with clinic number to call back to discuss test results. Will Mychart message.  Avanell Bob MD

## 2024-04-04 ENCOUNTER — Other Ambulatory Visit: Payer: Self-pay | Admitting: Family Medicine

## 2024-04-04 DIAGNOSIS — N049 Nephrotic syndrome with unspecified morphologic changes: Secondary | ICD-10-CM

## 2024-04-07 ENCOUNTER — Other Ambulatory Visit: Payer: Self-pay

## 2024-05-02 ENCOUNTER — Encounter: Payer: Self-pay | Admitting: Family Medicine

## 2024-05-05 ENCOUNTER — Other Ambulatory Visit: Payer: Self-pay | Admitting: Family Medicine

## 2024-05-05 DIAGNOSIS — Z111 Encounter for screening for respiratory tuberculosis: Secondary | ICD-10-CM

## 2024-05-05 NOTE — Progress Notes (Signed)
 Quant gold ordered.

## 2024-05-06 ENCOUNTER — Telehealth: Payer: Self-pay | Admitting: Family Medicine

## 2024-05-06 ENCOUNTER — Other Ambulatory Visit: Payer: PRIVATE HEALTH INSURANCE

## 2024-05-06 DIAGNOSIS — Z111 Encounter for screening for respiratory tuberculosis: Secondary | ICD-10-CM

## 2024-05-06 DIAGNOSIS — N049 Nephrotic syndrome with unspecified morphologic changes: Secondary | ICD-10-CM

## 2024-05-06 NOTE — Telephone Encounter (Signed)
 Reviewed form. Placed in PCP's box to be completed.  Christ Courier, CMA

## 2024-05-07 LAB — BASIC METABOLIC PANEL WITH GFR
BUN/Creatinine Ratio: 13 (ref 9–20)
BUN: 52 mg/dL — ABNORMAL HIGH (ref 6–24)
CO2: 16 mmol/L — ABNORMAL LOW (ref 20–29)
Calcium: 8 mg/dL — ABNORMAL LOW (ref 8.7–10.2)
Chloride: 107 mmol/L — ABNORMAL HIGH (ref 96–106)
Creatinine, Ser: 3.86 mg/dL — ABNORMAL HIGH (ref 0.76–1.27)
Glucose: 182 mg/dL — ABNORMAL HIGH (ref 70–99)
Potassium: 5.6 mmol/L — ABNORMAL HIGH (ref 3.5–5.2)
Sodium: 141 mmol/L (ref 134–144)
eGFR: 18 mL/min/1.73 — ABNORMAL LOW (ref >59)

## 2024-05-09 ENCOUNTER — Encounter: Payer: Self-pay | Admitting: Family Medicine

## 2024-05-09 ENCOUNTER — Telehealth: Payer: Self-pay | Admitting: Family Medicine

## 2024-05-09 ENCOUNTER — Other Ambulatory Visit: Payer: Self-pay

## 2024-05-09 ENCOUNTER — Encounter (HOSPITAL_COMMUNITY): Payer: Self-pay | Admitting: Emergency Medicine

## 2024-05-09 ENCOUNTER — Emergency Department (HOSPITAL_COMMUNITY)
Admission: EM | Admit: 2024-05-09 | Discharge: 2024-05-09 | Disposition: A | Discharge status: Home / Self Care | Payer: PRIVATE HEALTH INSURANCE | Attending: Emergency Medicine | Admitting: Emergency Medicine

## 2024-05-09 ENCOUNTER — Emergency Department (HOSPITAL_COMMUNITY): Payer: PRIVATE HEALTH INSURANCE

## 2024-05-09 ENCOUNTER — Ambulatory Visit: Payer: Self-pay | Admitting: Family Medicine

## 2024-05-09 ENCOUNTER — Other Ambulatory Visit: Payer: Self-pay | Admitting: Family Medicine

## 2024-05-09 DIAGNOSIS — E119 Type 2 diabetes mellitus without complications: Secondary | ICD-10-CM | POA: Insufficient documentation

## 2024-05-09 DIAGNOSIS — Z79899 Other long term (current) drug therapy: Secondary | ICD-10-CM | POA: Insufficient documentation

## 2024-05-09 DIAGNOSIS — R000 Tachycardia, unspecified: Secondary | ICD-10-CM | POA: Diagnosis not present

## 2024-05-09 DIAGNOSIS — N049 Nephrotic syndrome with unspecified morphologic changes: Secondary | ICD-10-CM

## 2024-05-09 DIAGNOSIS — I160 Hypertensive urgency: Secondary | ICD-10-CM | POA: Insufficient documentation

## 2024-05-09 DIAGNOSIS — R252 Cramp and spasm: Secondary | ICD-10-CM | POA: Diagnosis present

## 2024-05-09 LAB — URINALYSIS, ROUTINE W REFLEX MICROSCOPIC
Bilirubin Urine: NEGATIVE
Glucose, UA: 50 mg/dL — AB
Ketones, ur: NEGATIVE mg/dL
Leukocytes,Ua: NEGATIVE
Nitrite: NEGATIVE
Protein, ur: >=300 mg/dL — AB
Specific Gravity, Urine: 1.016 (ref 1.005–1.030)
pH: 5 (ref 5.0–8.0)

## 2024-05-09 LAB — CBC
HCT: 40.7 % (ref 39.0–52.0)
Hemoglobin: 12.7 g/dL — ABNORMAL LOW (ref 13.0–17.0)
MCH: 28.5 pg (ref 26.0–34.0)
MCHC: 31.2 g/dL (ref 30.0–36.0)
MCV: 91.3 fL (ref 80.0–100.0)
Platelets: 353 K/uL (ref 150–400)
RBC: 4.46 MIL/uL (ref 4.22–5.81)
RDW: 15.5 % (ref 11.5–15.5)
WBC: 7.3 K/uL (ref 4.0–10.5)
nRBC: 0 % (ref 0.0–0.2)

## 2024-05-09 LAB — CBG MONITORING, ED: Glucose-Capillary: 114 mg/dL — ABNORMAL HIGH (ref 70–99)

## 2024-05-09 LAB — COMPREHENSIVE METABOLIC PANEL WITH GFR
ALT: 10 U/L (ref 0–44)
AST: 20 U/L (ref 15–41)
Albumin: 2.7 g/dL — ABNORMAL LOW (ref 3.5–5.0)
Alkaline Phosphatase: 87 U/L (ref 38–126)
Anion gap: 11 (ref 5–15)
BUN: 54 mg/dL — ABNORMAL HIGH (ref 6–20)
CO2: 15 mmol/L — ABNORMAL LOW (ref 22–32)
Calcium: 8.2 mg/dL — ABNORMAL LOW (ref 8.9–10.3)
Chloride: 114 mmol/L — ABNORMAL HIGH (ref 98–111)
Creatinine, Ser: 3.98 mg/dL — ABNORMAL HIGH (ref 0.61–1.24)
GFR, Estimated: 17 mL/min — ABNORMAL LOW (ref >60)
Glucose, Bld: 123 mg/dL — ABNORMAL HIGH (ref 70–99)
Potassium: 4.4 mmol/L (ref 3.5–5.1)
Sodium: 140 mmol/L (ref 135–145)
Total Bilirubin: 0.6 mg/dL (ref 0.0–1.2)
Total Protein: 6.5 g/dL (ref 6.5–8.1)

## 2024-05-09 LAB — HIV ANTIBODY (ROUTINE TESTING W REFLEX): HIV Screen 4th Generation wRfx: NONREACTIVE

## 2024-05-09 LAB — HEPATITIS B SURFACE ANTIBODY,QUALITATIVE: Hep B S Ab: NONREACTIVE

## 2024-05-09 LAB — HEPATITIS B SURFACE ANTIGEN: Hepatitis B Surface Ag: NONREACTIVE

## 2024-05-09 LAB — HEPATITIS C ANTIBODY: HCV Ab: NONREACTIVE

## 2024-05-09 MED ORDER — SODIUM ZIRCONIUM CYCLOSILICATE 10 G PO PACK
10.0000 g | PACK | Freq: Once | ORAL | Status: AC
  Administered 2024-05-09: 10 g via ORAL
  Filled 2024-05-09: qty 1

## 2024-05-09 MED ORDER — SODIUM BICARBONATE 650 MG PO TABS
1300.0000 mg | ORAL_TABLET | Freq: Two times a day (BID) | ORAL | Status: DC
  Administered 2024-05-09: 1300 mg via ORAL
  Filled 2024-05-09: qty 2

## 2024-05-09 MED ORDER — FUROSEMIDE 40 MG PO TABS: 40.0000 mg | ORAL_TABLET | Freq: Two times a day (BID) | ORAL | 1 refills | Status: DC

## 2024-05-09 MED ORDER — AMLODIPINE BESYLATE 5 MG PO TABS
10.0000 mg | ORAL_TABLET | Freq: Every day | ORAL | Status: DC
  Administered 2024-05-09: 10 mg via ORAL
  Filled 2024-05-09: qty 2

## 2024-05-09 MED ORDER — FUROSEMIDE 20 MG PO TABS: 40.0000 mg | ORAL_TABLET | Freq: Two times a day (BID) | ORAL | Status: DC

## 2024-05-09 MED ORDER — AMLODIPINE BESYLATE 10 MG PO TABS: 10.0000 mg | ORAL_TABLET | Freq: Every day | ORAL | 0 refills | Status: DC

## 2024-05-09 NOTE — ED Notes (Signed)
 Informed RN of pt BP [id number])- re ran BP, RN attaining vitals.

## 2024-05-09 NOTE — ED Provider Notes (Signed)
  Physical Exam  BP (!) 191/119 (BP Location: Left Arm)   Pulse 78   Temp 98 F (36.7 C) (Oral)   Resp 20   Ht 5' 11 (1.803 m)   Wt (!) 137.3 kg   SpO2 100%   BMI 42.22 kg/m   Physical Exam  Procedures  Procedures  ED Course / MDM    Medical Decision Making Care assumed at 3 PM patient has a history of chronic renal failure and nephrology was consulted and recommend renal ultrasound.  If renal ultrasound is unremarkable, anticipate discharge home with amlodipine  and follow-up  4:43 PM Ultrasound showed no hydronephrosis.  At this point patient is stable for discharge  Amount and/or Complexity of Data Reviewed Labs: ordered. Decision-making details documented in ED Course.  Risk Prescription drug management.         Edward Alm Macho, MD 05/09/24 [phone]

## 2024-05-09 NOTE — Discharge Instructions (Addendum)
 Mr. Clowers was evaluated in the ED today for concern for high potassium and worsening renal function. Nephrology was consulted and recommended starting Amlodipine  for your high blood pressure and continuing your lasix  40mg  twice per day.  Make sure you follow up closely with nephrology and your PCP. Return to the ED if you develop chest pain, SOB, or difficulty/decreased urination.

## 2024-05-09 NOTE — ED Triage Notes (Signed)
 Pt had routine blood work done on Friday and was called this am to come in for hyperkaliemia. Only sx are generalized muscle pain and weakness.

## 2024-05-09 NOTE — ED Notes (Addendum)
 Pt back from ultrasound.

## 2024-05-09 NOTE — ED Notes (Signed)
 Pt. Is in bed; provider came by and discussed follow up plans with nephrology and plans to start on BP meds; pt. Has no complains of pain at this time.

## 2024-05-09 NOTE — ED Notes (Signed)
 Pt to ultrasound, RN aware giving meds prior to pt departure.

## 2024-05-09 NOTE — Progress Notes (Signed)
 Refilled lasix  40mg  BID per nephrology at ED

## 2024-05-09 NOTE — Telephone Encounter (Signed)
 Labs notable for AKI, and mild hyperkalemia K 5.6, pt previously at 2.53 Cr in [place]  and so this is notable for AKI.  Called pt x2, left VM recommending ED evaluation for worsening renal function and hyperkalemia. Left clinic number to call back. Will send Mychart message and try again later today.  Rollene Keeling MD

## 2024-05-09 NOTE — ED Notes (Signed)
 Md aware of BP, 189/112 and states that pt is OK for discharge.

## 2024-05-09 NOTE — ED Triage Notes (Signed)
 POV/ ambulatory by self/ sent by PCP for hyperkalemia @ 5.6/ pt denies symptoms at this time/ A&OX4

## 2024-05-09 NOTE — ED Provider Notes (Addendum)
 [place] [facility] Provider Note   CSN: [id number] Arrival date & time: 05/09/24  1117     Patient presents from PCP office with hyperkalemia   Edward [name] is a 53 y.o. male with PMH HTN, T2DM, HLD, Ankylosing spondylitis, Bipolar 1 disorder, migraines, and hx of perforated gastric ulcer presents today after routine labs from PCP resulted with hyperkalemia. His PCP instructed him that his K was critical and he should present to the ED. CMP from PCP showed K 5.6, Cr 3.86, BUN 56. The patient states that he has been following with his PCP for the last 3 months for worsening renal function and electrolyte monitoring. Today, he reports that his only symptom is persistent muscle cramps. Denies CP, SOB, or palpitations.      Prior to Admission medications   Medication Sig Start Date End Date Taking? Authorizing Provider  amLODipine  (NORVASC ) 10 MG tablet Take 1 tablet (10 mg total) by mouth daily. 05/09/24  Yes Myrna Bitters, DO  carvedilol  (COREG ) 12.5 MG tablet Take 1 tablet (12.5 mg total) by mouth 2 (two) times daily with a meal. Patient taking differently: Take 25 mg by mouth 2 (two) times daily with a meal. 12/15/22   Pray, Rollene BRAVO, MD  Continuous Blood Gluc Receiver (DEXCOM G7 RECEIVER) DEVI Use to monitor blood sugar 03/11/22   Scarlet Elsie LABOR, MD  Continuous Blood Gluc Sensor (DEXCOM G7 SENSOR) MISC Use to monitor blood sugar 12/09/22   Donzetta Rollene BRAVO, MD  ferrous sulfate  325 (65 FE) MG tablet Take 1 tablet (325 mg total) by mouth daily with breakfast. 03/21/24   Donzetta Rollene BRAVO, MD  furosemide  (LASIX ) 40 MG tablet Take 1 tablet (40 mg total) by mouth daily. 03/30/24   Donzetta Rollene BRAVO, MD  Multiple Vitamin (MULTIVITAMIN) tablet Take 1 tablet by mouth daily.    [provider]  pantoprazole  (PROTONIX ) 40 MG tablet Take 1 tablet (40 mg total) by mouth 2 (two) times daily. 03/21/24   Donzetta Rollene BRAVO, MD  sodium bicarbonate  650 MG tablet Take 1  tablet (650 mg total) by mouth 2 (two) times daily. 03/22/24   Donzetta Rollene BRAVO, MD  traMADol  (ULTRAM ) 50 MG tablet Take 1 tablet (50 mg total) by mouth every 6 (six) hours as needed. 01/12/23   Donzetta Rollene BRAVO, MD    Allergies: Demerol [meperidine], Nsaids, and Tapentadol    Review of Systems  Constitutional:  Negative for appetite change, chills and fever.  Respiratory:  Negative for chest tightness and shortness of breath.   Cardiovascular:  Positive for leg swelling. Negative for chest pain and palpitations.       Pt has frequent and intermittent leg swelling for which he takes Lasix   Gastrointestinal:  Positive for abdominal distention. Negative for abdominal pain, blood in stool, nausea and vomiting.  Genitourinary:  Negative for difficulty urinating, flank pain and hematuria.  Musculoskeletal:  Positive for myalgias.       Intermittent cramping feeling  Neurological:  Negative for dizziness.    Updated Vital Signs BP (!) 190/116   Pulse 85   Temp 98 F (36.7 C)   Resp (!) 22   Ht 5' 11 (1.803 m)   Wt (!) 137.3 kg   SpO2 100%   BMI 42.22 kg/m   Physical Exam Constitutional:      Appearance: Normal appearance. He is obese.  HENT:     Head: Normocephalic and atraumatic.  Cardiovascular:     Rate and  Rhythm: Regular rhythm. Tachycardia present.     Heart sounds: Normal heart sounds.  Pulmonary:     Effort: Pulmonary effort is normal.     Breath sounds: Normal breath sounds.  Abdominal:     General: Bowel sounds are normal. There is no distension.     Palpations: Abdomen is soft.     Tenderness: There is no abdominal tenderness. There is no guarding.  Musculoskeletal:     Right lower leg: No edema.     Left lower leg: No edema.  Skin:    General: Skin is warm and dry.  Neurological:     General: No focal deficit present.     Mental Status: He is alert.  Psychiatric:        Mood and Affect: Mood normal.        Thought Content: Thought content normal.      (all labs ordered are listed, but only abnormal results are displayed) Labs Reviewed  COMPREHENSIVE METABOLIC PANEL WITH GFR - Abnormal; Notable for the following components:      Result Value   Chloride 114 (*)    CO2 15 (*)    Glucose, Bld 123 (*)    BUN 54 (*)    Creatinine, Ser 3.98 (*)    Calcium  8.2 (*)    Albumin 2.7 (*)    GFR, Estimated 17 (*)    All other components within normal limits  CBC - Abnormal; Notable for the following components:   Hemoglobin 12.7 (*)    All other components within normal limits  URINALYSIS, ROUTINE W REFLEX MICROSCOPIC - Abnormal; Notable for the following components:   APPearance HAZY (*)    Glucose, UA 50 (*)    Hgb urine dipstick SMALL (*)    Protein, ur >=300 (*)    Bacteria, UA RARE (*)    All other components within normal limits  CBG MONITORING, ED - Abnormal; Notable for the following components:   Glucose-Capillary 114 (*)    All other components within normal limits  KAPPA/LAMBDA LIGHT CHAINS  PROTEIN ELECTROPHORESIS, SERUM  HIV ANTIBODY (ROUTINE TESTING W REFLEX)  HEPATITIS B CORE ANTIBODY, TOTAL  HEPATITIS B SURFACE ANTIBODY,QUALITATIVE  HEPATITIS B SURFACE ANTIGEN  HEPATITIS C ANTIBODY    EKG: None Sinus tachycardic with right axis deviation   Radiology: No results found.    Medications Ordered in the ED  amLODipine  (NORVASC ) tablet 10 mg (10 mg Oral Given 05/09/24 1445)  furosemide  (LASIX ) tablet 40 mg (has no administration in time range)  sodium bicarbonate  tablet 1,300 mg (has no administration in time range)  sodium zirconium cyclosilicate  (LOKELMA ) packet 10 g (10 g Oral Given 05/09/24 1445)    Medical Decision Making Amount and/or Complexity of Data Reviewed Labs: ordered.  Risk Prescription drug management.   Nephrotic Syndrome Pt has been following closely with PCP for management of worsening renal function. Does have long standing history of uncontrolled T2DM, uncontrolled HTN, and NSAID use  due to ankylosing spondylitis.  Today's labs significant for Cr. 3.98, BUN 54, CO2 15 In June, Protein/Cr Ratio [phone] Nephrology consulted and recommended outpatient follow up with initiation of amlodipine  and to continue lasix . Will obtain renal u/s here per nephrology. Pt stable for discharge after ultrasound  HTN Pt 195/118 today. Pt currently asymptomatic. He does run high at home generally 170's/100's. Amlodipine  initiated per nephrology today.      Final diagnoses:  Nephrotic syndrome  Asymptomatic hypertensive urgency    ED Discharge Orders  Ordered    amLODipine  (NORVASC ) 10 MG tablet  Daily        05/09/24 1523               Myrna Bitters, DO 05/09/24 1524    Yanelle Sousa, DO 05/09/24 1532    Myrna Bitters, DO 05/09/24 1536    Pamella Sharper A, DO 05/21/24 1627

## 2024-05-09 NOTE — Consult Note (Signed)
 Reason for Consult: Renal failure Referring Physician:  Dr. Schuyler Novak  Chief Complaint: Abnormal labs  Assessment/Plan: Renal failure -he has had progressive renal failure currently CKD stage IV secondary to diabetic nephropathy with nephrotic range proteinuria.  Will request a limited workup as he has a bland sediment and longstanding diabetes with already significant renal failure noted in [place] . -I spent a considerable time educating the patient and he will need follow-up at [facility] clinic for options class, CKD4 education, possible 24-hour urine collection, possibly referral for preemptive transplantation. -Absolute indication for dialysis but hyperkalemia may become an issue; he may not tolerate lisinopril  at this current time.  He did not even know what a low potassium diet was; I educated him to do some research as well as some of the more high yield foods to avoid that are very high in potassium.   - He already has an appt in early to mid August. We can check a Bmet early in a week as well.  -Monitor Daily I/Os, Daily weight  -Maintain MAP>65 for optimal renal perfusion.  - Avoid nephrotoxic agents such as IV contrast, NSAIDs, and phosphate containing bowel preps (FLEETS)  Hypertension -we will start amlodipine  as well as increase Lasix  to 40 mg twice daily, close follow-up to determine response and adjustment of therapy as needed. DM - per primary, A1c less than 7 Anklylosing spondylitis -already knows to avoid NSAIDs Bipolar disorger   HPI: Edward [name] is an 53 y.o. male with a history of hypertension, type 2 diabetes for greater than 15 years, hyperlipidemia, ankylosing spondylitis, bipolar 1 disorder, migraines and a perforated gastric ulcer in May 2025.  Patient is being referred by his primary care Dr. Donzetta because of abnormal labs.  Patient has had longstanding diabetes but is not on insulin  therapy at this current time, has not had an eye exam in many years, has been working  as a Warden/ranger in [place]  which is why he has not had routine physician office visits in until he recently returned from [place]  he has not had any blood work done until he had a perforated ulcer in May as well as when he returned to [facility] and saw Dr. Donzetta.  He was told he had renal failure in [place] , was taken off his blood pressure medicines recently because of worsening renal failure as well as hyperkalemia.  Patient has used NSAIDs in the past but has not used it since the gastric ulcer perforation.  He denies any history of nephrolithiasis, family members on dialysis but his mother is CKD 3; he does [place] obstructive symptoms, dysuria, hematuria.  He has had leg swelling for the past year.  ROS Pertinent items are noted in HPI.  Chemistry and CBC: Creat  Date/Time Value Ref Range Status  11/07/2021 09:03 AM 1.47 (H) 0.60 - 1.29 mg/dL Final  92/78/7982 90:74 AM 0.88 0.60 - 1.35 mg/dL Final  94/87/7982 89:92 AM 0.75 0.60 - 1.35 mg/dL Final   Creatinine, Ser  Date/Time Value Ref Range Status  05/09/2024 11:59 AM 3.98 (H) 0.61 - 1.24 mg/dL Final  92/74/7974 96:47 PM 3.86 (H) 0.76 - 1.27 mg/dL Final  93/82/7974 94:81 PM 3.03 (H) 0.76 - 1.27 mg/dL Final  93/90/7974 89:62 AM 2.69 (H) 0.76 - 1.27 mg/dL Final  95/98/7975 96:56 PM 1.15 0.76 - 1.27 mg/dL Final  92/82/7976 90:45 AM 1.09 0.76 - 1.27 mg/dL Final  95/75/7976 87:86 PM 1.05 0.76 - 1.27 mg/dL Final  91/96/7977 97:75 PM 0.88 0.76 - 1.27 mg/dL Final  12/15/2019 10:30 AM 0.96 0.76 - 1.27 mg/dL Final  89/74/7983 94:50 AM 0.88 0.61 - 1.24 mg/dL Final  89/79/7983 90:64 AM 0.78 0.61 - 1.24 mg/dL Final   Recent Labs  Lab 05/06/24 1552 05/09/24 1159  NA 141 140  K 5.6* 4.4  CL 107* 114*  CO2 16* 15*  GLUCOSE 182* 123*  BUN 52* 54*  CREATININE 3.86* 3.98*  CALCIUM  8.0* 8.2*   Recent Labs  Lab 05/09/24 1159  WBC 7.3  HGB 12.7*  HCT 40.7  MCV 91.3  PLT 353   Liver Function Tests: Recent Labs  Lab 05/09/24 1159   AST 20  ALT 10  ALKPHOS 87  BILITOT 0.6  PROT 6.5  ALBUMIN 2.7*   No results for input(s): LIPASE, AMYLASE in the last 168 hours. No results for input(s): AMMONIA in the last 168 hours. Cardiac Enzymes: No results for input(s): CKTOTAL, CKMB, CKMBINDEX, TROPONINI in the last 168 hours. Iron Studies: No results for input(s): IRON, TIBC, TRANSFERRIN, FERRITIN in the last 72 hours. PT/INR: @LABRCNTIP (inr:5)  Xrays/Other Studies: ) Results for orders placed or performed during the hospital encounter of 05/09/24 (from the past 48 hours)  Urinalysis, Routine w reflex microscopic -Urine, Clean Catch     Status: Abnormal   Collection Time: 05/09/24 11:54 AM  Result Value Ref Range   Color, Urine YELLOW YELLOW   APPearance HAZY (A) CLEAR   Specific Gravity, Urine 1.016 1.005 - 1.030   pH 5.0 5.0 - 8.0   Glucose, UA 50 (A) NEGATIVE mg/dL   Hgb urine dipstick SMALL (A) NEGATIVE   Bilirubin Urine NEGATIVE NEGATIVE   Ketones, ur NEGATIVE NEGATIVE mg/dL   Protein, ur >=699 (A) NEGATIVE mg/dL   Nitrite NEGATIVE NEGATIVE   Leukocytes,Ua NEGATIVE NEGATIVE   RBC / HPF 6-10 0 - 5 RBC/hpf   WBC, UA 0-5 0 - 5 WBC/hpf   Bacteria, UA RARE (A) NONE SEEN   Squamous Epithelial / HPF 0-5 0 - 5 /HPF   Mucus PRESENT    Amorphous Crystal PRESENT     Comment: Performed at [facility], [address] [place], [place] [address]  Comprehensive metabolic panel     Status: Abnormal   Collection Time: 05/09/24 11:59 AM  Result Value Ref Range   Sodium 140 135 - 145 mmol/L   Potassium 4.4 3.5 - 5.1 mmol/L   Chloride 114 (H) 98 - 111 mmol/L   CO2 15 (L) 22 - 32 mmol/L   Glucose, Bld 123 (H) 70 - 99 mg/dL    Comment: Glucose reference range applies only to samples taken after fasting for at least 8 hours.   BUN 54 (H) 6 - 20 mg/dL   Creatinine, Ser 6.01 (H) 0.61 - 1.24 mg/dL   Calcium  8.2 (L) 8.9 - 10.3 mg/dL   Total Protein 6.5 6.5 - 8.1 g/dL   Albumin 2.7 (L) 3.5 - 5.0  g/dL   AST 20 15 - 41 U/L   ALT 10 0 - 44 U/L   Alkaline Phosphatase 87 38 - 126 U/L   Total Bilirubin 0.6 0.0 - 1.2 mg/dL   GFR, Estimated 17 (L) >60 mL/min    Comment: (NOTE) Calculated using the CKD-EPI Creatinine Equation (2021)    Anion gap 11 5 - 15    Comment: Performed at [facility], [address] [place], [place] [address]  CBC     Status: Abnormal   Collection Time: 05/09/24 11:59 AM  Result Value Ref Range  WBC 7.3 4.0 - 10.5 K/uL   RBC 4.46 4.22 - 5.81 MIL/uL   Hemoglobin 12.7 (L) 13.0 - 17.0 g/dL   HCT 59.2 60.9 - 47.9 %   MCV 91.3 80.0 - 100.0 fL   MCH 28.5 26.0 - 34.0 pg   MCHC 31.2 30.0 - 36.0 g/dL   RDW 84.4 88.4 - 84.4 %   Platelets 353 150 - 400 K/uL   nRBC 0.0 0.0 - 0.2 %    Comment: Performed at [facility], [address] [place], [place] [address]  CBG monitoring, ED     Status: Abnormal   Collection Time: 05/09/24 12:26 PM  Result Value Ref Range   Glucose-Capillary 114 (H) 70 - 99 mg/dL    Comment: Glucose reference range applies only to samples taken after fasting for at least 8 hours.   No results found.  PMH:   Past Medical History:  Diagnosis Date   Rheumatic fever age 68    PSH:   Past Surgical History:  Procedure Laterality Date   arthroscopic surgery Left years ago   both knee cartlidge surgery     GASTRIC ROUX-EN-Y N/A 08/06/2015   Procedure: LAPAROSCOPIC ROUX-EN-Y GASTRIC BYPASS WITH UPPER ENDOSCOPY;  Surgeon: Camellia Blush, MD;  Location: [facility];  Service: General;  Laterality: N/A;   HIP SURGERY Left 1986   with knee surgery   KNEE SURGERY Right 1986   benign tumor with bone graft    UPPER GI ENDOSCOPY  08/06/2015   Procedure: UPPER GI ENDOSCOPY;  Surgeon: Camellia Blush, MD;  Location: [facility];  Service: General;;    Allergies:  Allergies  Allergen Reactions   Demerol [Meperidine] Swelling    Swelling of hands, neck   Nsaids Other (See Comments)    Gi upset, Per patient he is able to take some NSAIDs.    Tapentadol Other (See Comments)    Skin crawling    Medications:   Prior to Admission medications   Medication Sig Start Date End Date Taking? Authorizing Provider  carvedilol  (COREG ) 12.5 MG tablet Take 1 tablet (12.5 mg total) by mouth 2 (two) times daily with a meal. Patient taking differently: Take 25 mg by mouth 2 (two) times daily with a meal. 12/15/22   Pray, Rollene BRAVO, MD  Continuous Blood Gluc Receiver (DEXCOM G7 RECEIVER) DEVI Use to monitor blood sugar 03/11/22   Scarlet Elsie LABOR, MD  Continuous Blood Gluc Sensor (DEXCOM G7 SENSOR) MISC Use to monitor blood sugar 12/09/22   Donzetta Rollene BRAVO, MD  ferrous sulfate  325 (65 FE) MG tablet Take 1 tablet (325 mg total) by mouth daily with breakfast. 03/21/24   Donzetta Rollene BRAVO, MD  furosemide  (LASIX ) 40 MG tablet Take 1 tablet (40 mg total) by mouth daily. 03/30/24   Donzetta Rollene BRAVO, MD  Multiple Vitamin (MULTIVITAMIN) tablet Take 1 tablet by mouth daily.    [provider]  pantoprazole  (PROTONIX ) 40 MG tablet Take 1 tablet (40 mg total) by mouth 2 (two) times daily. 03/21/24   Donzetta Rollene BRAVO, MD  sodium bicarbonate  650 MG tablet Take 1 tablet (650 mg total) by mouth 2 (two) times daily. 03/22/24   Donzetta Rollene BRAVO, MD  traMADol  (ULTRAM ) 50 MG tablet Take 1 tablet (50 mg total) by mouth every 6 (six) hours as needed. 01/12/23   Donzetta Rollene BRAVO, MD    Discontinued Meds:  There are no discontinued medications.  Social History:  reports that he quit smoking about 12 years  ago. His smoking use included cigarettes. He started smoking about 22 years ago. He has a 2.5 pack-year smoking history. He has never used smokeless tobacco. He reports that he does not drink alcohol and does not use drugs.  Family History:   Family History  Problem Relation Age of Onset   Hyperlipidemia Mother    Hypertension Mother    Breast cancer Mother    Thyroid  disease Father    Early death Father 79       Cancer   Diabetes Father    Autoimmune disease  Father        Ankylosing Spondylitis   Squamous cell carcinoma Father    Diabetes Brother    Hyperlipidemia Brother    Hypertension Brother    Thyroid  disease Brother    Alcohol abuse Brother    Heart disease Maternal Grandmother    Stroke Maternal Grandmother    Heart disease Maternal Grandfather    Autoimmune disease Paternal Grandfather     Blood pressure (!) 192/120, pulse 85, temperature 98 F (36.7 C), resp. rate (!) 22, height 5' 11 (1.803 m), weight (!) 137.3 kg, SpO2 100%. General appearance: alert, cooperative, and appears stated age Head: Normocephalic, without obvious abnormality, atraumatic Eyes: negative Neck: no adenopathy, no carotid bruit, supple, symmetrical, trachea midline, and thyroid  not enlarged, symmetric, no tenderness/mass/nodules Back: symmetric, no curvature. ROM normal. No CVA tenderness. Resp: clear to auscultation bilaterally Cardio: regular rate and rhythm GI: soft, non-tender; bowel sounds normal; no masses,  no organomegaly Extremities: edema 1+ Pulses: 2+ and symmetric Skin: Skin color, texture, turgor normal. No rashes or lesions       Kristiann Noyce, LYNWOOD ORN, MD 05/09/2024, 2:11 PM

## 2024-05-09 NOTE — ED Notes (Signed)
Nephrology in room.

## 2024-05-10 LAB — KAPPA/LAMBDA LIGHT CHAINS
Kappa free light chain: 83.2 mg/L — ABNORMAL HIGH (ref 3.3–19.4)
Kappa, lambda light chain ratio: 1.28 (ref 0.26–1.65)
Lambda free light chains: 65.2 mg/L — ABNORMAL HIGH (ref 5.7–26.3)

## 2024-05-10 LAB — HEPATITIS B CORE ANTIBODY, TOTAL: HEP B CORE AB: NEGATIVE

## 2024-05-11 LAB — PROTEIN ELECTROPHORESIS, SERUM
A/G Ratio: 1.2 (ref 0.7–1.7)
Albumin ELP: 2.9 g/dL (ref 2.9–4.4)
Alpha-1-Globulin: 0.1 g/dL (ref 0.0–0.4)
Alpha-2-Globulin: 0.8 g/dL (ref 0.4–1.0)
Beta Globulin: 1 g/dL (ref 0.7–1.3)
Gamma Globulin: 0.5 g/dL (ref 0.4–1.8)
Globulin, Total: 2.4 g/dL (ref 2.2–3.9)
Total Protein ELP: 5.3 g/dL — ABNORMAL LOW (ref 6.0–8.5)

## 2024-05-11 LAB — QUANTIFERON-TB GOLD PLUS
QuantiFERON Mitogen Value: >10 [IU]/mL
QuantiFERON Nil Value: 0.04 [IU]/mL
QuantiFERON TB1 Ag Value: 0.04 [IU]/mL
QuantiFERON TB2 Ag Value: 0.02 [IU]/mL

## 2024-05-12 ENCOUNTER — Telehealth: Payer: Self-pay

## 2024-05-12 NOTE — Telephone Encounter (Signed)
 Form placed up front for pick up.   Copy made for batch scanning.   Attempted to call patient, however no answer. VM left.   Forms are ready up front.

## 2024-05-12 NOTE — Telephone Encounter (Signed)
-----   Message from Rollene FORBES Keeling sent at 05/11/2024 10:39 AM EDT ----- Regarding: TB test completed Hi team,  Pt form completed TB test engative, in RN box this AM.  Thanks, Gwynneth Keeling

## 2024-05-19 ENCOUNTER — Other Ambulatory Visit (HOSPITAL_COMMUNITY): Payer: Self-pay | Admitting: Nephrology

## 2024-05-19 DIAGNOSIS — R809 Proteinuria, unspecified: Secondary | ICD-10-CM

## 2024-05-22 ENCOUNTER — Encounter (HOSPITAL_COMMUNITY): Payer: Self-pay

## 2024-05-22 ENCOUNTER — Inpatient Hospital Stay (HOSPITAL_COMMUNITY)
Admission: EM | Admit: 2024-05-22 | Discharge: 2024-05-25 | DRG: 304 | Disposition: A | Discharge status: Home / Self Care | Payer: PRIVATE HEALTH INSURANCE | Attending: Family Medicine | Admitting: Family Medicine

## 2024-05-22 ENCOUNTER — Other Ambulatory Visit: Payer: Self-pay

## 2024-05-22 ENCOUNTER — Emergency Department (HOSPITAL_COMMUNITY): Payer: PRIVATE HEALTH INSURANCE

## 2024-05-22 DIAGNOSIS — Z8659 Personal history of other mental and behavioral disorders: Secondary | ICD-10-CM

## 2024-05-22 DIAGNOSIS — Z803 Family history of malignant neoplasm of breast: Secondary | ICD-10-CM

## 2024-05-22 DIAGNOSIS — Z8719 Personal history of other diseases of the digestive system: Secondary | ICD-10-CM

## 2024-05-22 DIAGNOSIS — Z87891 Personal history of nicotine dependence: Secondary | ICD-10-CM

## 2024-05-22 DIAGNOSIS — I161 Hypertensive emergency: Secondary | ICD-10-CM | POA: Diagnosis present

## 2024-05-22 DIAGNOSIS — E1165 Type 2 diabetes mellitus with hyperglycemia: Secondary | ICD-10-CM | POA: Diagnosis present

## 2024-05-22 DIAGNOSIS — I6381 Other cerebral infarction due to occlusion or stenosis of small artery: Secondary | ICD-10-CM | POA: Diagnosis present

## 2024-05-22 DIAGNOSIS — Z789 Other specified health status: Secondary | ICD-10-CM

## 2024-05-22 DIAGNOSIS — Z79899 Other long term (current) drug therapy: Secondary | ICD-10-CM | POA: Diagnosis not present

## 2024-05-22 DIAGNOSIS — Z83438 Family history of other disorder of lipoprotein metabolism and other lipidemia: Secondary | ICD-10-CM | POA: Diagnosis not present

## 2024-05-22 DIAGNOSIS — Z8249 Family history of ischemic heart disease and other diseases of the circulatory system: Secondary | ICD-10-CM

## 2024-05-22 DIAGNOSIS — M459 Ankylosing spondylitis of unspecified sites in spine: Secondary | ICD-10-CM | POA: Diagnosis present

## 2024-05-22 DIAGNOSIS — I639 Cerebral infarction, unspecified: Secondary | ICD-10-CM | POA: Diagnosis present

## 2024-05-22 DIAGNOSIS — Z8349 Family history of other endocrine, nutritional and metabolic diseases: Secondary | ICD-10-CM | POA: Diagnosis not present

## 2024-05-22 DIAGNOSIS — R1310 Dysphagia, unspecified: Secondary | ICD-10-CM | POA: Diagnosis present

## 2024-05-22 DIAGNOSIS — I129 Hypertensive chronic kidney disease with stage 1 through stage 4 chronic kidney disease, or unspecified chronic kidney disease: Secondary | ICD-10-CM | POA: Diagnosis present

## 2024-05-22 DIAGNOSIS — R29705 NIHSS score 5: Secondary | ICD-10-CM | POA: Diagnosis present

## 2024-05-22 DIAGNOSIS — R4182 Altered mental status, unspecified: Secondary | ICD-10-CM | POA: Diagnosis present

## 2024-05-22 DIAGNOSIS — Z811 Family history of alcohol abuse and dependence: Secondary | ICD-10-CM | POA: Diagnosis not present

## 2024-05-22 DIAGNOSIS — I6389 Other cerebral infarction: Secondary | ICD-10-CM

## 2024-05-22 DIAGNOSIS — E785 Hyperlipidemia, unspecified: Secondary | ICD-10-CM | POA: Diagnosis present

## 2024-05-22 DIAGNOSIS — N179 Acute kidney failure, unspecified: Secondary | ICD-10-CM | POA: Diagnosis present

## 2024-05-22 DIAGNOSIS — Z823 Family history of stroke: Secondary | ICD-10-CM

## 2024-05-22 DIAGNOSIS — Z885 Allergy status to narcotic agent status: Secondary | ICD-10-CM

## 2024-05-22 DIAGNOSIS — R4701 Aphasia: Secondary | ICD-10-CM | POA: Diagnosis present

## 2024-05-22 DIAGNOSIS — Z9884 Bariatric surgery status: Secondary | ICD-10-CM

## 2024-05-22 DIAGNOSIS — E66813 Obesity, class 3: Secondary | ICD-10-CM | POA: Diagnosis present

## 2024-05-22 DIAGNOSIS — Z6841 Body Mass Index (BMI) 40.0 and over, adult: Secondary | ICD-10-CM

## 2024-05-22 DIAGNOSIS — E1122 Type 2 diabetes mellitus with diabetic chronic kidney disease: Secondary | ICD-10-CM | POA: Diagnosis present

## 2024-05-22 DIAGNOSIS — Z833 Family history of diabetes mellitus: Secondary | ICD-10-CM

## 2024-05-22 DIAGNOSIS — G4733 Obstructive sleep apnea (adult) (pediatric): Secondary | ICD-10-CM | POA: Diagnosis present

## 2024-05-22 DIAGNOSIS — R29701 NIHSS score 1: Secondary | ICD-10-CM | POA: Diagnosis not present

## 2024-05-22 DIAGNOSIS — N184 Chronic kidney disease, stage 4 (severe): Secondary | ICD-10-CM | POA: Diagnosis present

## 2024-05-22 DIAGNOSIS — N1832 Chronic kidney disease, stage 3b: Secondary | ICD-10-CM | POA: Diagnosis not present

## 2024-05-22 DIAGNOSIS — Z886 Allergy status to analgesic agent status: Secondary | ICD-10-CM

## 2024-05-22 DIAGNOSIS — I69391 Dysphagia following cerebral infarction: Secondary | ICD-10-CM | POA: Diagnosis not present

## 2024-05-22 HISTORY — DX: Disorder of kidney and ureter, unspecified: N28.9

## 2024-05-22 LAB — CBC WITH DIFFERENTIAL/PLATELET
Abs Immature Granulocytes: 0.03 K/uL (ref 0.00–0.07)
Basophils Absolute: 0.1 K/uL (ref 0.0–0.1)
Basophils Relative: 1 %
Eosinophils Absolute: 0.4 K/uL (ref 0.0–0.5)
Eosinophils Relative: 4 %
HCT: 41.4 % (ref 39.0–52.0)
Hemoglobin: 13.1 g/dL (ref 13.0–17.0)
Immature Granulocytes: 0 %
Lymphocytes Relative: 23 %
Lymphs Abs: 1.9 K/uL (ref 0.7–4.0)
MCH: 28.2 pg (ref 26.0–34.0)
MCHC: 31.6 g/dL (ref 30.0–36.0)
MCV: 89.2 fL (ref 80.0–100.0)
Monocytes Absolute: 0.7 K/uL (ref 0.1–1.0)
Monocytes Relative: 8 %
Neutro Abs: 5.2 K/uL (ref 1.7–7.7)
Neutrophils Relative %: 64 %
Platelets: 360 K/uL (ref 150–400)
RBC: 4.64 MIL/uL (ref 4.22–5.81)
RDW: 15.3 % (ref 11.5–15.5)
WBC: 8.3 K/uL (ref 4.0–10.5)
nRBC: 0 % (ref 0.0–0.2)

## 2024-05-22 LAB — COMPREHENSIVE METABOLIC PANEL WITH GFR
ALT: 9 U/L (ref 0–44)
AST: 19 U/L (ref 15–41)
Albumin: 2.7 g/dL — ABNORMAL LOW (ref 3.5–5.0)
Alkaline Phosphatase: 86 U/L (ref 38–126)
Anion gap: 12 (ref 5–15)
BUN: 61 mg/dL — ABNORMAL HIGH (ref 6–20)
CO2: 22 mmol/L (ref 22–32)
Calcium: 8.3 mg/dL — ABNORMAL LOW (ref 8.9–10.3)
Chloride: 104 mmol/L (ref 98–111)
Creatinine, Ser: 4.76 mg/dL — ABNORMAL HIGH (ref 0.61–1.24)
GFR, Estimated: 14 mL/min — ABNORMAL LOW (ref >60)
Glucose, Bld: 139 mg/dL — ABNORMAL HIGH (ref 70–99)
Potassium: 4.8 mmol/L (ref 3.5–5.1)
Sodium: 138 mmol/L (ref 135–145)
Total Bilirubin: 0.5 mg/dL (ref 0.0–1.2)
Total Protein: 6.4 g/dL — ABNORMAL LOW (ref 6.5–8.1)

## 2024-05-22 LAB — CBG MONITORING, ED: Glucose-Capillary: 134 mg/dL — ABNORMAL HIGH (ref 70–99)

## 2024-05-22 LAB — URINALYSIS, ROUTINE W REFLEX MICROSCOPIC
Bilirubin Urine: NEGATIVE
Glucose, UA: >=500 mg/dL — AB
Hgb urine dipstick: NEGATIVE
Ketones, ur: NEGATIVE mg/dL
Leukocytes,Ua: NEGATIVE
Nitrite: NEGATIVE
Protein, ur: >=300 mg/dL — AB
Specific Gravity, Urine: 1.01 (ref 1.005–1.030)
pH: 6 (ref 5.0–8.0)

## 2024-05-22 LAB — AMMONIA: Ammonia: <13 umol/L (ref 9–35)

## 2024-05-22 MED ORDER — CLOPIDOGREL BISULFATE 300 MG PO TABS
300.0000 mg | ORAL_TABLET | Freq: Once | ORAL | Status: AC
  Administered 2024-05-22: 300 mg via ORAL
  Filled 2024-05-22: qty 1

## 2024-05-22 MED ORDER — ACETAMINOPHEN 325 MG PO TABS
650.0000 mg | ORAL_TABLET | ORAL | Status: DC | PRN
  Administered 2024-05-22: 650 mg via ORAL
  Filled 2024-05-22: qty 2

## 2024-05-22 MED ORDER — ACETAMINOPHEN 650 MG RE SUPP: 650.0000 mg | RECTAL | Status: DC | PRN

## 2024-05-22 MED ORDER — TIZANIDINE HCL 4 MG PO TABS
4.0000 mg | ORAL_TABLET | Freq: Three times a day (TID) | ORAL | Status: DC | PRN
  Administered 2024-05-22 – 2024-05-23 (×3): 4 mg via ORAL
  Filled 2024-05-22 (×2): qty 1

## 2024-05-22 MED ORDER — CLOPIDOGREL BISULFATE 75 MG PO TABS
75.0000 mg | ORAL_TABLET | Freq: Every day | ORAL | Status: DC
  Administered 2024-05-23 – 2024-05-25 (×6): 75 mg via ORAL
  Filled 2024-05-22 (×3): qty 1

## 2024-05-22 MED ORDER — ROSUVASTATIN CALCIUM 5 MG PO TABS
5.0000 mg | ORAL_TABLET | Freq: Every day | ORAL | Status: DC
  Administered 2024-05-22: 5 mg via ORAL
  Filled 2024-05-22: qty 1

## 2024-05-22 MED ORDER — STROKE: EARLY STAGES OF RECOVERY BOOK
Freq: Once | Status: AC
  Filled 2024-05-22: qty 1

## 2024-05-22 MED ORDER — ENOXAPARIN SODIUM 30 MG/0.3ML IJ SOSY
30.0000 mg | PREFILLED_SYRINGE | INTRAMUSCULAR | Status: DC
  Administered 2024-05-22: 30 mg via SUBCUTANEOUS
  Filled 2024-05-22: qty 0.3

## 2024-05-22 MED ORDER — SODIUM CHLORIDE 0.9 % IV SOLN: INTRAVENOUS | Status: AC

## 2024-05-22 MED ORDER — ACETAMINOPHEN 160 MG/5ML PO SOLN: 650.0000 mg | ORAL | Status: DC | PRN

## 2024-05-22 MED ORDER — ASPIRIN 81 MG PO TBEC
81.0000 mg | DELAYED_RELEASE_TABLET | Freq: Every day | ORAL | Status: DC
  Administered 2024-05-22 – 2024-05-25 (×7): 81 mg via ORAL
  Filled 2024-05-22 (×4): qty 1

## 2024-05-22 MED ORDER — OXYCODONE HCL 5 MG PO TABS
2.5000 mg | ORAL_TABLET | Freq: Four times a day (QID) | ORAL | Status: DC | PRN
  Administered 2024-05-23 (×2): 2.5 mg via ORAL
  Filled 2024-05-22: qty 1

## 2024-05-22 MED ORDER — TIZANIDINE HCL 4 MG PO TABS
4.0000 mg | ORAL_TABLET | Freq: Once | ORAL | Status: AC
  Administered 2024-05-22: 4 mg via ORAL
  Filled 2024-05-22: qty 1

## 2024-05-22 MED ORDER — PANTOPRAZOLE SODIUM 40 MG PO TBEC
40.0000 mg | DELAYED_RELEASE_TABLET | Freq: Two times a day (BID) | ORAL | Status: DC
  Administered 2024-05-22 – 2024-05-25 (×11): 40 mg via ORAL
  Filled 2024-05-22 (×6): qty 1

## 2024-05-22 MED ORDER — LORAZEPAM 2 MG/ML IJ SOLN
1.0000 mg | Freq: Once | INTRAMUSCULAR | Status: AC
  Administered 2024-05-22: 1 mg via INTRAVENOUS
  Filled 2024-05-22: qty 1

## 2024-05-22 MED ORDER — TRAMADOL HCL 50 MG PO TABS
50.0000 mg | ORAL_TABLET | Freq: Two times a day (BID) | ORAL | Status: DC
  Administered 2024-05-23 (×2): 50 mg via ORAL
  Filled 2024-05-22: qty 1

## 2024-05-22 MED ORDER — SENNOSIDES-DOCUSATE SODIUM 8.6-50 MG PO TABS: 1.0000 | ORAL_TABLET | Freq: Every evening | ORAL | Status: DC | PRN

## 2024-05-22 NOTE — Assessment & Plan Note (Addendum)
 CT head showing 2 lacunar infarcts, head MRI showing 8 mm acute ischemic nonhemorrhagic stroke involving the ventral left thalamus. Clinically improving with better mentation since presenting to the ED.  - Admit to FMTS, attending Dr. Donah - Medtele, Vital signs per floor - Renal diet  - VTE prophylaxis: Lovenox  - Fall precautions - Delirium precautions - Neurology consulted - Hold home BP medications d/t acute stroke, permissive HTN - PT/OT/SLP - AM UA, BMP, lipids and A1c ordered - Echo ordered - Continue Crestor  5mg  qhs - Antiplatelet therapy per neurology - Telemetry 24-48h

## 2024-05-22 NOTE — Consult Note (Signed)
 NEUROLOGY CONSULT NOTE   Date of service: May 22, 2024 Patient Name: Edward [name] MRN:  [id number] DOB:  1971-04-08 Chief Complaint: Confusion Requesting Provider: Donah Laymon PARAS, MD  History of Present Illness  Edward [name] is a 53 y.o. male with hx of CKD  LKW: *** Modified rankin score: {Modified Rankin Scale:21264} IV Thrombolysis: ***Yes, *** No (reason) EVT: ***Yes, *** No (reason) ICH Score:***  NIHSS components Score: Comment  1a Level of Conscious 0[]  1[]  2[]  3[]      1b LOC Questions 0[]  1[]  2[]       1c LOC Commands 0[]  1[]  2[]       2 Best Gaze 0[]  1[]  2[]       3 Visual 0[]  1[]  2[]  3[]      4 Facial Palsy 0[]  1[]  2[]  3[]      5a Motor Arm - left 0[]  1[]  2[]  3[]  4[]  UN[]    5b Motor Arm - Right 0[]  1[]  2[]  3[]  4[]  UN[]    6a Motor Leg - Left 0[]  1[]  2[]  3[]  4[]  UN[]    6b Motor Leg - Right 0[]  1[]  2[]  3[]  4[]  UN[]    7 Limb Ataxia 0[]  1[]  2[]  UN[]      8 Sensory 0[]  1[]  2[]  UN[]      9 Best Language 0[]  1[]  2[]  3[]      10 Dysarthria 0[]  1[]  2[]  UN[]      11 Extinct. and Inattention 0[]  1[]  2[]       TOTAL:       ROS  ***Comprehensive ROS performed and pertinent positives documented in HPI  ***Unable to ascertain due to ***  Past History   Past Medical History:  Diagnosis Date   Renal disorder    CKD stage 4   Rheumatic fever age 39    Past Surgical History:  Procedure Laterality Date   arthroscopic surgery Left years ago   both knee cartlidge surgery     GASTRIC ROUX-EN-Y N/A 08/06/2015   Procedure: LAPAROSCOPIC ROUX-EN-Y GASTRIC BYPASS WITH UPPER ENDOSCOPY;  Surgeon: Camellia Blush, MD;  Location: [facility];  Service: General;  Laterality: N/A;   HIP SURGERY Left 1986   with knee surgery   KNEE SURGERY Right 1986   benign tumor with bone graft    UPPER GI ENDOSCOPY  08/06/2015   Procedure: UPPER GI ENDOSCOPY;  Surgeon: Camellia Blush, MD;  Location: [facility];  Service: General;;    Family History: Family History  Problem Relation Age of Onset    Hyperlipidemia Mother    Hypertension Mother    Breast cancer Mother    Thyroid  disease Father    Early death Father 23       Cancer   Diabetes Father    Autoimmune disease Father        Ankylosing Spondylitis   Squamous cell carcinoma Father    Diabetes Brother    Hyperlipidemia Brother    Hypertension Brother    Thyroid  disease Brother    Alcohol abuse Brother    Heart disease Maternal Grandmother    Stroke Maternal Grandmother    Heart disease Maternal Grandfather    Autoimmune disease Paternal Grandfather     Social History  reports that he quit smoking about 12 years ago. His smoking use included cigarettes. He started smoking about 22 years ago. He has a 2.5 pack-year smoking history. He has never used smokeless tobacco. He reports that he does not drink alcohol and does not use drugs.  Allergies  Allergen Reactions   Demerol [Meperidine] Swelling  Swelling of hands, neck   Nsaids Other (See Comments)    Gi upset, Per patient he is able to take some NSAIDs.   Tapentadol Other (See Comments)    Skin crawling    Medications   Current Facility-Administered Medications:    [START ON 05/23/2024]  stroke: early stages of recovery book, , Does not apply, Once, Edward Perkins, DO   0.9 %  sodium chloride  infusion, , Intravenous, Continuous, Edward Perkins, DO, Last Rate: 40 mL/hr at 05/22/24 2044, New Bag at 05/22/24 2044   acetaminophen  (TYLENOL ) tablet 650 mg, 650 mg, Oral, Q4H PRN, 650 mg at 05/22/24 2044 **OR** acetaminophen  (TYLENOL ) 160 MG/5ML solution 650 mg, 650 mg, Per Tube, Q4H PRN **OR** acetaminophen  (TYLENOL ) suppository 650 mg, 650 mg, Rectal, Q4H PRN, Edward Perkins, DO   aspirin  EC tablet 81 mg, 81 mg, Oral, Daily, Michaela Aisha SQUIBB, MD, 81 mg at 05/22/24 2228   [COMPLETED] clopidogrel  (PLAVIX ) tablet 300 mg, 300 mg, Oral, Once, 300 mg at 05/22/24 2228 **AND** [START ON 05/23/2024] clopidogrel  (PLAVIX ) tablet 75 mg, 75 mg, Oral, Daily, Michaela Aisha SQUIBB,  MD   enoxaparin  (LOVENOX ) injection 30 mg, 30 mg, Subcutaneous, Q24H, Edward Perkins, DO, 30 mg at 05/22/24 2043   oxyCODONE  (Oxy IR/ROXICODONE ) immediate release tablet 2.5 mg, 2.5 mg, Oral, Q6H PRN, Edward Perkins, DO   pantoprazole  (PROTONIX ) EC tablet 40 mg, 40 mg, Oral, BID, Edward Perkins, DO, 40 mg at 05/22/24 2228   rosuvastatin  (CRESTOR ) tablet 5 mg, 5 mg, Oral, QHS, Edward Perkins, DO, 5 mg at 05/22/24 2228   senna-docusate (Senokot-S) tablet 1 tablet, 1 tablet, Oral, QHS PRN, Edward Perkins, DO   tiZANidine  (ZANAFLEX ) tablet 4 mg, 4 mg, Oral, Q8H PRN, Edward Perkins, DO, 4 mg at 05/22/24 2044   [START ON 05/23/2024] traMADol  (ULTRAM ) tablet 50 mg, 50 mg, Oral, BID, Edward Perkins, DO  Current Outpatient Medications:    amLODipine  (NORVASC ) 10 MG tablet, Take 1 tablet (10 mg total) by mouth daily., Disp: 30 tablet, Rfl: 0   b complex vitamins capsule, Take 1 capsule by mouth daily., Disp: , Rfl:    carvedilol  (COREG ) 3.125 MG tablet, Take 3.125 mg by mouth 2 (two) times daily., Disp: , Rfl:    diphenhydrAMINE (BENADRYL) 25 MG tablet, Take 50 mg by mouth at bedtime., Disp: , Rfl:    ferrous sulfate  325 (65 FE) MG tablet, Take 1 tablet (325 mg total) by mouth daily with breakfast., Disp: 60 tablet, Rfl: 1   furosemide  (LASIX ) 40 MG tablet, Take 1 tablet (40 mg total) by mouth 2 (two) times daily., Disp: 60 tablet, Rfl: 1   magnesium (MAGTAB) 84 MG ( ) TBCR SR tablet, Take 250 mg by mouth daily., Disp: , Rfl:    pantoprazole  (PROTONIX ) 40 MG tablet, Take 1 tablet (40 mg total) by mouth 2 (two) times daily., Disp: 60 tablet, Rfl: 3   rosuvastatin  (CRESTOR ) 5 MG tablet, Take 5 mg by mouth at bedtime., Disp: , Rfl:    sodium bicarbonate  650 MG tablet, Take 1 tablet (650 mg total) by mouth 2 (two) times daily., Disp: 60 tablet, Rfl: 1   traMADol  (ULTRAM ) 50 MG tablet, Take 1 tablet (50 mg total) by mouth every 6 (six) hours as needed., Disp: 120 tablet, Rfl: 0   zinc gluconate 50 MG tablet, Take  50 mg by mouth daily., Disp: , Rfl:    carvedilol  (COREG ) 12.5 MG tablet, Take 1 tablet (12.5 mg total) by mouth 2 (two) times daily with a meal. (  Patient not taking: Reported on 04-Jun-2024), Disp: 180 tablet, Rfl: 3   Continuous Blood Gluc Receiver (DEXCOM G7 RECEIVER) DEVI, Use to monitor blood sugar (Patient not taking: Reported on June 04, 2024), Disp: 1 each, Rfl: 0   Continuous Blood Gluc Sensor (DEXCOM G7 SENSOR) MISC, Use to monitor blood sugar (Patient not taking: Reported on 06-04-24), Disp: 1 each, Rfl: 6   Multiple Vitamin (MULTIVITAMIN) tablet, Take 1 tablet by mouth daily. (Patient not taking: Reported on 06-04-24), Disp: , Rfl:   Vitals   Vitals:   04-Jun-2024 1930 Jun 04, 2024 2015 June 04, 2024 2215 06/04/2024 2222  BP: 102/69 119/83 94/65 122/82  Pulse: 75 77 74 78  Resp: (!) 25 16 19  (!) 23  Temp: 98.5 F (36.9 C)     TempSrc: Oral     SpO2: 93% 96% 94% 96%  Weight:      Height:        Body mass index is 42.22 kg/m.   Physical Exam   Constitutional: Appears well-developed and well-nourished. *** Psych: Affect appropriate to situation. *** Eyes: No scleral injection. *** HENT: No OP obstruction. *** Head: Normocephalic. *** Cardiovascular: Normal rate and regular rhythm. *** Respiratory: Effort normal, non-labored breathing. *** GI: Soft.  No distension. There is no tenderness. *** Skin: WDI. ***  Neurologic Examination   ***  Labs/Imaging/Neurodiagnostic studies   CBC:  Recent Labs  Lab 06-04-24 0837  WBC 8.3  NEUTROABS 5.2  HGB 13.1  HCT 41.4  MCV 89.2  PLT 360   Basic Metabolic Panel:  Lab Results  Component Value Date   NA 138 04-Jun-2024   K 4.8 06-04-2024   CO2 22 June 04, 2024   GLUCOSE 139 (H) Jun 04, 2024   BUN 61 (H) 2024/06/04   CREATININE 4.76 (H) 06-04-2024   CALCIUM  8.3 (L) 06-04-2024   GFRNONAA 14 (L) Jun 04, 2024   GFRAA 108 12/15/2019   Lipid Panel:  Lab Results  Component Value Date   LDLCALC 217 (H) 03/21/2024   HgbA1c:  Lab Results   Component Value Date   HGBA1C 8.4 (A) 01/12/2023   Urine Drug Screen: No results found for: LABOPIA, COCAINSCRNUR, LABBENZ, AMPHETMU, THCU, LABBARB  Alcohol Level No results found for: ETH INR No results found for: INR APTT No results found for: APTT AED levels: No results found for: PHENYTOIN, ZONISAMIDE, LAMOTRIGINE, LEVETIRACETA  CT Head without contrast(Personally reviewed): ***  CT angio Head and Neck with contrast(Personally reviewed): ***  MR Angio head without contrast and Carotid Duplex BL(Personally reviewed): ***  MRI Brain(Personally reviewed): ***  Neurodiagnostics rEEG:  ***  ASSESSMENT   Blayton Huttner is a 53 y.o. male ***  RECOMMENDATIONS  *** ______________________________________________________________________    Bonney Aisha Seals, MD [facility]

## 2024-05-22 NOTE — Hospital Course (Addendum)
 Edward [name] is a 53 y.o. male presenting with AMS and left sided flank pain   Stroke, AMS: Patient presented with AMS have been worsening since 05/18/24 and found to have lacunar infarcts on CT head and 8 mm acute ischemic nonhemorrhagic stroke involving ventral left thalamus on head MRI. Echo unremarkable. A1C 7.4. Started atorvastatin  + ezetemibe instead for high intensity statin therapy for stroke and LDL level of 202. Neurology recommends DAPT for 3 weeks, then keeping only aspirin  until renal biopsy in 4 weeks, then continue aspirin  after. Pt was also given CPAP prior to discharge.   CKD stage 3b: Creatinine of 4.76 in the ED, with baseline creatinine of 2-2.5.  Suspected nephrotic syndrome given very elevated protein creatinine ratio outpatient.  Due to necessity of aspirin  and plavix  post stroke, renal biopsy deferred until outpatient in 4 weeks.   Other chronic conditions were medically managed with home medications and formulary alternatives as necessary (HTN, T2DM, HLD, ankylosing spondylitis, Bipolar 1)  PCP Follow-up Recommendations: Ensure follow up with nephrology, they want to do a renal biopsy in 4 weeks.  Will take Plavix  for 3 weeks, then ASA alone Discontinued Tramadol  for AS pain (AKI) and sent home with oxy, assess benefit He is interested in GLP1 for DM control, was previously approved

## 2024-05-22 NOTE — ED Notes (Signed)
 Patient transported to MRI

## 2024-05-22 NOTE — ED Notes (Signed)
 Patient transported to CT

## 2024-05-22 NOTE — ED Provider Notes (Signed)
 [place] EM[facility]GENCY DEPARTMENT AT [facility] Provider Note   CSN: [id number] Arrival date & time: 05/22/24  9176     Patient presents with: Back Pain and Altered Mental Status   Edward [name] is a 53 y.o. male.  With a history of ankylosing spondylitis, hypertension, diabetes and renal failure who presents to the ED for back pain and altered mental status.  Patient was seen in the emergency department on July 28 given concern for increased creatinine.  He is currently undergoing evaluation from nephrology (Dr. Melia) and has undergone renal ultrasound with a plan for renal biopsy later this month with concern for persistently increasing creatinine with decreasing renal function.  Today he returns with increased left-sided back pain.  No inciting injury.  This pain is different from the pain typically associated with his ankylosing spondylitis.  He also voices concern for increased global confusion over the last few days which is also noted by his fiance.  No fevers chills chest pain shortness of breath or abdominal pain.  Has been able to urinate without great issue.    Back Pain Altered Mental Status      Prior to Admission medications   Medication Sig Start Date End Date Taking? Authorizing Provider  amLODipine  (NORVASC ) 10 MG tablet Take 1 tablet (10 mg total) by mouth daily. 05/09/24  Yes King, Olivia, DO  b complex vitamins capsule Take 1 capsule by mouth daily.   Yes [provider]  carvedilol  (COREG ) 3.125 MG tablet Take 3.125 mg by mouth 2 (two) times daily. 05/18/24  Yes [provider]  diphenhydrAMINE (BENADRYL) 25 MG tablet Take 50 mg by mouth at bedtime.   Yes [provider]  ferrous sulfate  325 (65 FE) MG tablet Take 1 tablet (325 mg total) by mouth daily with breakfast. 03/21/24  Yes Pray, Rollene BRAVO, MD  furosemide  (LASIX ) 40 MG tablet Take 1 tablet (40 mg total) by mouth 2 (two) times daily. 05/09/24  Yes Pray, Rollene BRAVO, MD  magnesium  (MAGTAB) 84 MG ( ) TBCR SR tablet Take 250 mg by mouth daily.   Yes [provider]  pantoprazole  (PROTONIX ) 40 MG tablet Take 1 tablet (40 mg total) by mouth 2 (two) times daily. 03/21/24  Yes Pray, Rollene BRAVO, MD  rosuvastatin  (CRESTOR ) 5 MG tablet Take 5 mg by mouth at bedtime.   Yes [provider]  sodium bicarbonate  650 MG tablet Take 1 tablet (650 mg total) by mouth 2 (two) times daily. 03/22/24  Yes Pray, Rollene BRAVO, MD  traMADol  (ULTRAM ) 50 MG tablet Take 1 tablet (50 mg total) by mouth every 6 (six) hours as needed. 01/12/23  Yes Pray, Rollene BRAVO, MD  zinc gluconate 50 MG tablet Take 50 mg by mouth daily.   Yes [provider]  carvedilol  (COREG ) 12.5 MG tablet Take 1 tablet (12.5 mg total) by mouth 2 (two) times daily with a meal. Patient not taking: Reported on 05/22/2024 12/15/22   Donzetta Rollene BRAVO, MD  Continuous Blood Gluc Receiver (DEXCOM G7 RECEIVER) DEVI Use to monitor blood sugar Patient not taking: Reported on 05/22/2024 03/11/22   Scarlet Elsie LABOR, MD  Continuous Blood Gluc Sensor (DEXCOM G7 SENSOR) MISC Use to monitor blood sugar Patient not taking: Reported on 05/22/2024 12/09/22   Donzetta Rollene BRAVO, MD  Multiple Vitamin (MULTIVITAMIN) tablet Take 1 tablet by mouth daily. Patient not taking: Reported on 05/22/2024    [provider]    Allergies: Demerol [meperidine], Nsaids, and Tapentadol  Review of Systems  Musculoskeletal:  Positive for back pain.    Updated Vital Signs BP (!) 151/95   Pulse 82   Temp 98.1 F (36.7 C) (Oral)   Resp 16   Ht 5' 11 (1.803 m)   Wt (!) 137.3 kg   SpO2 96%   BMI 42.22 kg/m   Physical Exam Vitals and nursing note reviewed.  HENT:     Head: Normocephalic and atraumatic.  Eyes:     Pupils: Pupils are equal, round, and reactive to light.  Cardiovascular:     Rate and Rhythm: Normal rate and regular rhythm.  Pulmonary:     Effort: Pulmonary effort is normal.     Breath sounds: Normal breath  sounds.  Abdominal:     Palpations: Abdomen is soft.     Tenderness: There is no abdominal tenderness.  Musculoskeletal:     Comments: Left paraspinal lumbar tenderness without midline tender step-off deformity  Skin:    General: Skin is warm and dry.  Neurological:     General: No focal deficit present.     Mental Status: He is alert and oriented to person, place, and time.     Sensory: No sensory deficit.     Motor: No weakness.  Psychiatric:        Mood and Affect: Mood normal.     (all labs ordered are listed, but only abnormal results are displayed) Labs Reviewed  COMPREHENSIVE METABOLIC PANEL WITH GFR - Abnormal; Notable for the following components:      Result Value   Glucose, Bld 139 (*)    BUN 61 (*)    Creatinine, Ser 4.76 (*)    Calcium  8.3 (*)    Total Protein 6.4 (*)    Albumin 2.7 (*)    GFR, Estimated 14 (*)    All other components within normal limits  URINALYSIS, ROUTINE W REFLEX MICROSCOPIC - Abnormal; Notable for the following components:   Color, Urine STRAW (*)    Glucose, UA >=500 (*)    Protein, ur >=300 (*)    Bacteria, UA RARE (*)    All other components within normal limits  CBG MONITORING, ED - Abnormal; Notable for the following components:   Glucose-Capillary 134 (*)    All other components within normal limits  CBC WITH DIFFERENTIAL/PLATELET  AMMONIA    EKG: EKG Interpretation Date/Time:  Sunday May 22 2024 08:52:31 EDT Ventricular Rate:  82 PR Interval:  184 QRS Duration:  86 QT Interval:  396 QTC Calculation: 463 R Axis:   70  Text Interpretation: Sinus rhythm Nonspecific T abnormalities, lateral leads Confirmed by Pamella Sharper [phone]) on 05/22/2024 3:41:44 PM  Radiology: CT ABDOMEN PELVIS WO CONTRAST Result Date: 05/22/2024 CLINICAL DATA:  Acute renal failure. EXAM: CT ABDOMEN AND PELVIS WITHOUT CONTRAST TECHNIQUE: Multidetector CT imaging of the abdomen and pelvis was performed following the standard protocol without IV  contrast. RADIATION DOSE REDUCTION: This exam was performed according to the departmental dose-optimization program which includes automated exposure control, adjustment of the mA and/or kV according to patient size and/or use of iterative reconstruction technique. COMPARISON:  Renal ultrasound 05/09/2024. Report only from remote abdominopelvic CT 12/12/2014. Pelvic and right hip radiographs 05/15/2021. FINDINGS: Lower chest: Clear lung bases. No significant pleural or pericardial effusion. Hepatobiliary: The liver appears unremarkable as imaged in the noncontrast state. No morphologic changes of cirrhosis or focal abnormalities are identified. There is 1.9 cm peripherally calcified gallstone. No evidence of gallbladder wall thickening, surrounding inflammation or  biliary ductal dilatation. Pancreas: Unremarkable. No pancreatic ductal dilatation or surrounding inflammatory changes. Spleen: Normal in size without focal abnormality. Adrenals/Urinary Tract: Both adrenal glands appear normal. No evidence of urinary tract calculus, hydronephrosis or focal renal abnormality on noncontrast imaging. There is mild symmetric perinephric soft tissue stranding bilaterally. The bladder appears unremarkable for its degree of distention. Stomach/Bowel: No enteric contrast administered. There are postsurgical changes consistent with Roux-en-Y gastric bypass. No bowel distension, wall thickening or surrounding inflammation. The appendix appears normal. Mildly prominent stool throughout the colon. Vascular/Lymphatic: There are no enlarged abdominal or pelvic lymph nodes. Small retroperitoneal lymph nodes are not pathologically enlarged. Mild aortoiliac atherosclerosis. Reproductive: The prostate gland and seminal vesicles appear unremarkable. Other: 1.4 cm peripherally calcified nodule in the superior omentum (image 32/3), likely a focus of remote fat necrosis or inflammation. Mildly prominent fat in both inguinal canals. No ascites  or pneumoperitoneum. Musculoskeletal: No acute or significant osseous findings. Multilevel spondylosis with chronic appearing osseous foraminal narrowing bilaterally at L5-S1. Probable postsurgical or posttraumatic findings in the lateral aspect of the left iliac crest, unchanged from prior radiographs. The sacroiliac joints appear partially ankylosed bilaterally. IMPRESSION: 1. No acute findings or explanation for the patient's symptoms. 2. No evidence of urinary tract calculus or hydronephrosis. Mild nonspecific perinephric soft tissue stranding bilaterally which could be secondary to underlying renal inflammation. Correlate clinically. 3. Cholelithiasis without evidence of cholecystitis or biliary ductal dilatation. 4. 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 5.  Aortic Atherosclerosis (ICD10-I70.0). Electronically Signed   By: Elsie Perone M.D.   On: 05/22/2024 12:16   CT Head Wo Contrast Result Date: 05/22/2024 EXAM: CT HEAD WITHOUT CONTRAST 05/22/2024 11:53:52 AM TECHNIQUE: CT of the head was performed without the administration of intravenous contrast. Automated exposure control, iterative reconstruction, and/or weight based adjustment of the mA/kV was utilized to reduce the radiation dose to as low as reasonably achievable. COMPARISON: None available. CLINICAL HISTORY: Mental status change, unknown cause. FINDINGS: BRAIN AND VENTRICLES: No acute hemorrhage. Gray-white differentiation is preserved. No hydrocephalus. No extra-axial collection. No mass effect or midline shift. 2 separate remote lacunar infarcts are present within the left thalamus. Subtle white matter hypoattenuation is mildly advanced for age. ORBITS: No acute abnormality. SINUSES: No acute abnormality. SOFT TISSUES AND SKULL: No acute soft tissue abnormality. No skull fracture. IMPRESSION: 1. No acute intracranial abnormality. 2. 2 separate remote lacunar infarcts within the left thalamus. 3. Subtle white matter  hypoattenuation is mildly advanced for age. This may reflect underlying microvascular ischemia. Electronically signed by: Lonni Necessary MD 05/22/2024 12:11 PM EDT RP Workstation: [id number]     Procedures   Medications Ordered in the ED - No data to display  Clinical Course as of 05/22/24 1542  Sun May 22, 2024  1120 Laboratory workup shows proteinuria with steadily declining renal function.  Will obtain CT abdomen pelvis without contrast along with a CT head without contrast with concern for mental status change of unknown etiology.  No leukocytosis, anemia or elevation in ammonia [MP]  1315 CT abdomen pelvis without contrast shows no acute findings.  CT head shows 2 lacunar infarcts.  Discussed this with Dr.Arora (neurology) who recommends MRI brain without contrast to rule out left hemispheric or embolic stroke as cause of altered mental status.  Will order MRI.  Most likely cause at this time would be increasing uremia in the setting of acute renal failure. [MP]  1540  I, Ozell Marine DO, am transitioning care of this patient to the oncoming  provider pending MRI reevaluation and disposition [MP]    Clinical Course User Index [MP] Pamella Ozell LABOR, DO                                 Medical Decision Making 53 year old male with history as above presented to the ED given concern for increased global confusion.  Recently seen here for acute renal failure and being worked up by neurology for this.  Creatinine and renal function have been progressively worsening.  Global confusion without any focal neurologic deficits.  Some increased left lower back pain which is newer for him as well.  No infectious symptoms.  Will repeat laboratory workup and add on ammonia given concern for altered mental status.  Has had a renal ultrasound but has not had a recent CAT scan.  Will consider CAT scan without contrast once labs are back.  Admission likely.  Amount and/or Complexity of Data  Reviewed Labs: ordered. Radiology: ordered.        Final diagnoses:  Acute renal failure, unspecified acute renal failure type (HCC)  Altered mental status, unspecified altered mental status type    ED Discharge Orders     None          Pamella Ozell LABOR, DO 05/22/24 1542

## 2024-05-22 NOTE — ED Provider Notes (Signed)
  Physical Exam  BP (!) 161/111   Pulse 83   Temp 98.1 F (36.7 C) (Oral)   Resp 19   Ht 5' 11 (1.803 m)   Wt (!) 137.3 kg   SpO2 97%   BMI 42.22 kg/m   Physical Exam  Procedures  Procedures  ED Course / MDM   Clinical Course as of 05/22/24 1758  Sun May 22, 2024  1120 Laboratory workup shows proteinuria with steadily declining renal function.  Will obtain CT abdomen pelvis without contrast along with a CT head without contrast with concern for mental status change of unknown etiology.  No leukocytosis, anemia or elevation in ammonia [MP]  1315 CT abdomen pelvis without contrast shows no acute findings.  CT head shows 2 lacunar infarcts.  Discussed this with Dr.Arora (neurology) who recommends MRI brain without contrast to rule out left hemispheric or embolic stroke as cause of altered mental status.  Will order MRI.  Most likely cause at this time would be increasing uremia in the setting of acute renal failure. [MP]  1540  I, Ozell Marine DO, am transitioning care of this patient to the oncoming provider pending MRI reevaluation and disposition [MP]    Clinical Course User Index [MP] Marine Ozell LABOR, DO   Medical Decision Making Care assumed at 3 PM.  Patient is here with altered mental status.  Patient had some confusion and trouble speaking for the last 2 days.  Labs significant for worsening creatinine but normal potassium.  Signed out pending MRI brain to rule out stroke  5:59 PM MRI showed left thalamus acute ischemic stroke.  Discussed with Dr. Voncile from neurology.  He will see patient as a consult.  Patient will be admitted to family practice  Problems Addressed: Acute renal failure, unspecified acute renal failure type [facility]): acute illness or injury Altered mental status, unspecified altered mental status type: acute illness or injury Cerebrovascular accident (CVA), unspecified mechanism (HCC): acute illness or injury  Amount and/or Complexity of Data  Reviewed Labs: ordered. Decision-making details documented in ED Course. Radiology: ordered and independent interpretation performed. Decision-making details documented in ED Course.  Risk Prescription drug management.          Edward Alm Macho, MD 05/22/24 1800

## 2024-05-22 NOTE — ED Triage Notes (Signed)
 Pt c.o left sided kidney pain and increased confusion since Friday. Pt has hx of CKD stage 4. Denies n.v

## 2024-05-22 NOTE — ED Notes (Signed)
 CCMD called, pt on monitor

## 2024-05-22 NOTE — Assessment & Plan Note (Addendum)
 Type 2 diabetes mellitus: Diet controled Hypertension: hold home carvedilol  in setting of permissive HTN  Hyperlipidemia: Continue home rosuvastatin  Ankylosing spondylitis: Continue home tramadol , and tizanidine , oxycodone  2.5 q6 PRN Bipolar 1: Not currently on any medication, previously on lithium  d/c 4/24

## 2024-05-22 NOTE — H&P (Cosign Needed Addendum)
 Hospital Admission History and Physical Service Pager: [phone]  Patient name: Edward [name] Medical record number: [id number] Date of Birth: 07-27-1971 Age: 53 y.o. Gender: male  Primary Care Provider: Donzetta Rollene BRAVO, MD Consultants: Neurology, Nephrology Code Status: Full code which was confirmed with family if patient unable to confirm   Preferred Emergency Contact:  Contact Information     Name Relation Home Work Mobile   willard,tracy Significant other   [phone]   Clapp,Diane Mother [phone]        Other Contacts   None on File     Chief Complaint: AMS  Assessment and Plan: Edward [name] is a 53 y.o. male presenting with AMS and left sided flank pain . Differential for presentation of this includes stroke, uremia, DKA.  Stroke confirmed on MRI most likely contributing to AMS.  Possible differential could be hypercoagulable state secondary to A-fib although EKG showed NSR.  Could also be secondary to nephrotic syndrome, will need additional workup per nephrology.  Could also be secondary to known diabetes and hyperlipidemia will check labs in the morning.  AMS could also be secondary to uremia as he has had acute worsening in creatinine.  This is potentially less likely as his kidney function has been elevated for quite some time.  DKA less likely as he does not have an anion gap and his blood sugar is within normal limits.  Will monitor CBGs with meals and at bedtime and check A1c in the morning.  Assessment & Plan Stroke [facility]) AMS (altered mental status) CT head showing 2 lacunar infarcts, head MRI showing 8 mm acute ischemic nonhemorrhagic stroke involving the ventral left thalamus. Clinically improving with better mentation since presenting to the ED.  - Admit to FMT[facility], attending Dr. Donah - [facility], Vital signs per floor - Renal diet  - VTE prophylaxis: Lovenox  - Fall precautions - Delirium precautions - Neurology consulted - Hold home BP  medications d/t acute stroke, permissive HTN - PT/OT/SLP - AM UA, BMP, lipids and A1c ordered - Echo ordered - Continue Crestor  5mg  qhs - Antiplatelet therapy per neurology - Telemetry 24-48h Chronic kidney disease, stage 3b (HCC) Baseline Cr approx 2-2.5.  Outpatient kidney biopsy pending, will consult nephrology to see if they will perform inpatient. CT AP: Mild nonspecific perinephric soft tissue stranding bilaterally ?underlying renal inflammation.  Differential for CKD currently unclear, considering nephrotic syndrome versus inflammatory from ankylosing spondylitis.  - RFP ordered daily - hold IVF, will encourage PO, appears euvolemic on exam - neprho to be consulted in AM  Chronic health problem Type 2 diabetes mellitus: Diet controled Hypertension: hold home carvedilol  in setting of permissive HTN  Hyperlipidemia: Continue home rosuvastatin  Ankylosing spondylitis: Continue home tramadol , and tizanidine , oxycodone  2.5 q6 PRN Bipolar 1: Not currently on any medication, previously on lithium  d/c 4/24  FEN/GI: Renal diet VTE Prophylaxis: Lovenox   Disposition: [facility], Attending Dr. Donah   History of Present Illness:  Edward [name] is a 53 y.o. male presenting with left-sided flank pain and AMS with worsening confusion since Friday.  Had previously been seen in the emergency department July 28 for increasing creatinine.    Cognition seemed to progressively get worse from Wednesday feeling foggy to Thursday where he did not seem like his normal self, did not do well at work on Friday. Very confused, irritable and could not remember his fiances children's name. Saturday woke up in pain and still confused with memory issues. At dinner Saturday night kept repeating questions to the  point where they came to the ED.   Normal urine output per fiance. Has been more sleepy than normal. Kidney pain seemed to get a little worse throughout the week. Located on the left side. Scheduled to get a  renal biopsy later this month.   In the ED, patient presented with AMS and left-sided flank pain.  Laboratory workup showed proteinuria and declining renal function.  No leukocytosis, anemia, or elevation in ammonia lab workup.  CT abdomen pelvis was obtained and showed no acute findings.  CT head showed 2 lacunar infarcts and neurology was consulted.  Dr. Deedra (neurology) suggested MRI brain without contrast to rule out embolic stroke as cause of AMS.  At this time, most likely cause of AMS would be increasing uremia in the setting of acute renal failure.  Patient will be admitted to the family medicine residency and we will assume her care.  Review Of Systems: Per HPI with the following additions: as above   Pertinent Past Medical History: Type 2 diabetes mellitus Hypertension Hyperlipidemia CKD stage IIIb-last CR 2.53 Ankylosing spondylitis Bipolar 1  Pertinent Past Surgical History: Roux-en-Y gastric bypass   Pertinent Social History: Tobacco use: Former, quit 4 weeks ago; ppd  Alcohol use: denies Other Substance use: cannabis  Lives with fiance   Pertinent Family History: none Remainder reviewed in history tab.   Important Outpatient Medications: Amlodipine  10 mg daily Farxiga 10 mg daily  carvedilol  3.125 mg twice daily,  Benadryl 25 mg at night,  iron tablets 325,  furosemide  40 mg twice daily.   Magnesium 84 mg daily  pantoprazole  40 mg twice daily  rosuvastatin  5 mg daily,  bicarb 650 mg daily twice daily,  tramadol  50 mg every 6 as needed,  Zinc 50 mg daily Remainder reviewed in medication history.   Objective: BP (!) 161/108   Pulse 83   Temp 98.1 F (36.7 C) (Oral)   Resp 18   Ht 5' 11 (1.803 m)   Wt (!) 137.3 kg   SpO2 100%   BMI 42.22 kg/m  Exam: General: Laying in bed, fianc at bedside, NAD Eyes: Full ROM Cardiovascular: RRR, no rub/murmur/gallop Respiratory: CTAB, normal work of breathing on room air MSK: 5/5 strength UE and  LE Extremities: Nonedematous, shiny, well-perfused Neuro: normal strength and sensation, alert and oriented, some confusion presumably 2/2 Ativan  Psych: Pleasant and cooperative  Labs:  CBC BMET  Recent Labs  Lab 05/22/24 0837  WBC 8.3  HGB 13.1  HCT 41.4  PLT 360   Recent Labs  Lab 05/22/24 0837  NA 138  K 4.8  CL 104  CO2 22  BUN 61*  CREATININE 4.76*  GLUCOSE 139*  CALCIUM  8.3*      Imaging Studies Performed:  MRI brain: Impression for radiologist: 1. 8 mm acute ischemic nonhemorrhagic infarct involving the ventral left thalamus. 2. Underlying age advanced chronic microvascular ischemic disease with a few scattered remote lacunar infarcts about the left basal ganglia and left thalamus.  CT Head: Impression from Radiologist:  1. No acute intracranial abnormality. 2. 2 separate remote lacunar infarcts within the left thalamus. 3. Subtle white matter hypoattenuation is mildly advanced for age. This may reflect underlying microvascular ischemia.  CT abdomen pelvis: Impression from Radiologist:  1. No acute findings or explanation for the patient's symptoms. 2. No evidence of urinary tract calculus or hydronephrosis. Mild nonspecific perinephric soft tissue stranding bilaterally which could be secondary to underlying renal inflammation. Correlate clinically. 3. Cholelithiasis without evidence of cholecystitis or  biliary ductal dilatation. 4. 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 5.  Aortic Atherosclerosis     Idelle Nakai, DO 05/22/2024, 7:23 PM PGY-1, [facility]  FPTS Intern pager: [phone], text pages welcome Secure chat group [facility] Teaching Service    I have reviewed the above note and made the appropriate changes.   Damien Pinal, DO [facility], PGY-3 05/22/24 9:11 PM

## 2024-05-22 NOTE — Assessment & Plan Note (Addendum)
 Baseline Cr approx 2-2.5.  Outpatient kidney biopsy pending, will consult nephrology to see if they will perform inpatient. CT AP: Mild nonspecific perinephric soft tissue stranding bilaterally ?underlying renal inflammation.  Differential for CKD currently unclear, considering nephrotic syndrome versus inflammatory from ankylosing spondylitis.  - RFP ordered daily - hold IVF, will encourage PO, appears euvolemic on exam - neprho to be consulted in AM

## 2024-05-23 ENCOUNTER — Inpatient Hospital Stay (HOSPITAL_COMMUNITY): Payer: PRIVATE HEALTH INSURANCE

## 2024-05-23 DIAGNOSIS — I129 Hypertensive chronic kidney disease with stage 1 through stage 4 chronic kidney disease, or unspecified chronic kidney disease: Secondary | ICD-10-CM

## 2024-05-23 DIAGNOSIS — Z87891 Personal history of nicotine dependence: Secondary | ICD-10-CM

## 2024-05-23 DIAGNOSIS — E785 Hyperlipidemia, unspecified: Secondary | ICD-10-CM | POA: Diagnosis not present

## 2024-05-23 DIAGNOSIS — I639 Cerebral infarction, unspecified: Secondary | ICD-10-CM

## 2024-05-23 DIAGNOSIS — I6389 Other cerebral infarction: Secondary | ICD-10-CM

## 2024-05-23 DIAGNOSIS — N1832 Chronic kidney disease, stage 3b: Secondary | ICD-10-CM | POA: Diagnosis not present

## 2024-05-23 DIAGNOSIS — I69391 Dysphagia following cerebral infarction: Secondary | ICD-10-CM

## 2024-05-23 LAB — LIPID PANEL
Cholesterol: 273 mg/dL — ABNORMAL HIGH (ref 0–200)
HDL: 33 mg/dL — ABNORMAL LOW (ref >40)
LDL Cholesterol: 202 mg/dL — ABNORMAL HIGH (ref 0–99)
Total CHOL/HDL Ratio: 8.3 ratio
Triglycerides: 190 mg/dL — ABNORMAL HIGH (ref <150)
VLDL: 38 mg/dL (ref 0–40)

## 2024-05-23 LAB — HEMOGLOBIN A1C
Hgb A1c MFr Bld: 7.4 % — ABNORMAL HIGH (ref 4.8–5.6)
Mean Plasma Glucose: 166 mg/dL

## 2024-05-23 LAB — RENAL FUNCTION PANEL
Albumin: 2.4 g/dL — ABNORMAL LOW (ref 3.5–5.0)
Anion gap: 11 (ref 5–15)
BUN: 60 mg/dL — ABNORMAL HIGH (ref 6–20)
CO2: 21 mmol/L — ABNORMAL LOW (ref 22–32)
Calcium: 8.3 mg/dL — ABNORMAL LOW (ref 8.9–10.3)
Chloride: 105 mmol/L (ref 98–111)
Creatinine, Ser: 4.71 mg/dL — ABNORMAL HIGH (ref 0.61–1.24)
GFR, Estimated: 14 mL/min — ABNORMAL LOW (ref >60)
Glucose, Bld: 136 mg/dL — ABNORMAL HIGH (ref 70–99)
Phosphorus: 6.2 mg/dL — ABNORMAL HIGH (ref 2.5–4.6)
Potassium: 4.3 mmol/L (ref 3.5–5.1)
Sodium: 137 mmol/L (ref 135–145)

## 2024-05-23 LAB — ECHOCARDIOGRAM COMPLETE
AV Mean grad: 2 mmHg
AV Peak grad: 3.4 mmHg
Ao pk vel: 0.93 m/s
Area-P 1/2: 6.12 cm2
Height: 71 in
S' Lateral: 2.4 cm
Weight: 4843.07 [oz_av]

## 2024-05-23 LAB — GLUCOSE, CAPILLARY
Glucose-Capillary: 166 mg/dL — ABNORMAL HIGH (ref 70–99)
Glucose-Capillary: 239 mg/dL — ABNORMAL HIGH (ref 70–99)

## 2024-05-23 LAB — CBG MONITORING, ED: Glucose-Capillary: 129 mg/dL — ABNORMAL HIGH (ref 70–99)

## 2024-05-23 MED ORDER — HYDROXYZINE HCL 10 MG PO TABS
10.0000 mg | ORAL_TABLET | Freq: Three times a day (TID) | ORAL | Status: DC | PRN
  Administered 2024-05-24 – 2024-05-25 (×6): 10 mg via ORAL
  Filled 2024-05-23 (×3): qty 1

## 2024-05-23 MED ORDER — ATORVASTATIN CALCIUM 40 MG PO TABS
40.0000 mg | ORAL_TABLET | Freq: Every day | ORAL | Status: DC
  Administered 2024-05-23 – 2024-05-25 (×6): 40 mg via ORAL
  Filled 2024-05-23: qty 1
  Filled 2024-05-23: qty 4
  Filled 2024-05-23: qty 1

## 2024-05-23 MED ORDER — OXYCODONE HCL 5 MG PO TABS
2.5000 mg | ORAL_TABLET | Freq: Four times a day (QID) | ORAL | Status: DC | PRN
  Administered 2024-05-23 – 2024-05-25 (×10): 2.5 mg via ORAL
  Filled 2024-05-23 (×5): qty 1

## 2024-05-23 MED ORDER — HEPARIN SODIUM (PORCINE) 5000 UNIT/ML IJ SOLN
5000.0000 [IU] | Freq: Three times a day (TID) | INTRAMUSCULAR | Status: DC
  Administered 2024-05-23 – 2024-05-25 (×10): 5000 [IU] via SUBCUTANEOUS
  Filled 2024-05-23 (×6): qty 1

## 2024-05-23 MED ORDER — EZETIMIBE 10 MG PO TABS
10.0000 mg | ORAL_TABLET | Freq: Every day | ORAL | Status: DC
  Administered 2024-05-23 – 2024-05-25 (×6): 10 mg via ORAL
  Filled 2024-05-23 (×3): qty 1

## 2024-05-23 MED ORDER — OXYCODONE HCL 5 MG PO TABS
2.5000 mg | ORAL_TABLET | Freq: Once | ORAL | Status: AC
  Administered 2024-05-23 (×2): 2.5 mg via ORAL
  Filled 2024-05-23: qty 1

## 2024-05-23 NOTE — Consult Note (Signed)
 [facility] Renal Consultation Note  Requesting MD: [name] MD Indication for Consultation: Proteinuria  HPI:  Edward [name] is a 53 yo M with PMH of ankylosing spondylitis, T2DM previously uncontrolled, progressing renal failure now CKD4, HLD, HTN, recent perforated gastric ulcer May 2025.  He presented to [facility] ED 8/10 with CC AMS (poor memory, slowed speech) and L side flank pain x 2 days duration. Mr Blazejewski shares that his loved ones observed that he was not like his normal self - appeared confused, irritable, impaired memory - and encouraged him to report to ER. 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 Subsequent evaluation revealed 2 acute lacunar infarcts for which the patient was admitted to hospital on [facility] service with neurology consulting.  He reports a history of kidney disease and diabetes but denies acute complaints. He denies dysuria, hematuria, frothy urine. He admits to edema of the LEs that is chronic and not worse than typical. He shared that his diabetes was poorly controlled in the past, at one point on insulin  and A1c above 14.  Additionally, he was evaluated in the ED on 05/09/24 for hyperkalemia found at PCP visit and nephrology was consulted for proteinuria. The patient did not require hospital admission but plans were made for follow up in renal clinic and treatment with lasix  40 BID and amlodipine  5 every day was started.  He moved here from [place]  last year and did not have routine medical care at that time.   Creat  Date/Time Value Ref Range Status  11/07/2021 09:03 AM 1.47 (H) 0.60 - 1.29 mg/dL Final  92/78/7982 90:74 AM 0.88 0.60 - 1.35 mg/dL Final  94/87/7982 89:92 AM 0.75 0.60 - 1.35 mg/dL Final   Creatinine, Ser  Date/Time Value Ref Range Status  05/23/2024 01:30 AM 4.71 (H) 0.61 - 1.24 mg/dL Final  91/89/7974 91:62 AM 4.76 (H) 0.61 - 1.24 mg/dL Final   92/71/7974 88:40 AM 3.98 (H) 0.61 - 1.24 mg/dL Final  92/74/7974 96:47 PM 3.86 (H) 0.76 - 1.27 mg/dL Final  93/82/7974 94:81 PM 3.03 (H) 0.76 - 1.27 mg/dL Final  93/90/7974 89:62 AM 2.69 (H) 0.76 - 1.27 mg/dL Final  95/98/7975 96:56 PM 1.15 0.76 - 1.27 mg/dL Final  92/82/7976 90:45 AM 1.09 0.76 - 1.27 mg/dL Final  95/75/7976 87:86 PM 1.05 0.76 - 1.27 mg/dL Final  91/96/7977 97:75 PM 0.88 0.76 - 1.27 mg/dL Final  96/95/7978 89:69 AM 0.96 0.76 - 1.27 mg/dL Final  89/74/7983 94:50 AM 0.88 0.61 - 1.24 mg/dL Final  89/79/7983 90:64 AM 0.78 0.61 - 1.24 mg/dL Final     PMHx:   Past Medical History:  Diagnosis Date   Renal disorder    CKD stage 4   Rheumatic fever age 32    Past Surgical History:  Procedure Laterality Date   arthroscopic surgery Left years ago   both knee cartlidge surgery     GASTRIC ROUX-EN-Y N/A 08/06/2015   Procedure: LAPAROSCOPIC ROUX-EN-Y GASTRIC BYPASS WITH UPPER ENDOSCOPY;  Surgeon: Camellia Blush, MD;  Location: [facility];  Service: General;  Laterality: N/A;   HIP SURGERY Left 1986   with knee surgery   KNEE SURGERY Right 1986   benign tumor with bone graft    UPPER GI ENDOSCOPY  08/06/2015   Procedure: UPPER GI ENDOSCOPY;  Surgeon: Camellia Blush, MD;  Location: [facility];  Service: General;;    Family Hx:  Family History  Problem Relation Age  of Onset   Hyperlipidemia Mother    Hypertension Mother    Breast cancer Mother    Thyroid  disease Father    Early death Father 62       Cancer   Diabetes Father    Autoimmune disease Father        Ankylosing Spondylitis   Squamous cell carcinoma Father    Diabetes Brother    Hyperlipidemia Brother    Hypertension Brother    Thyroid  disease Brother    Alcohol abuse Brother    Heart disease Maternal Grandmother    Stroke Maternal Grandmother    Heart disease Maternal Grandfather    Autoimmune disease Paternal Grandfather     Social History:  reports that he quit smoking about 12 years ago. His smoking use  included cigarettes. He started smoking about 22 years ago. He has a 2.5 pack-year smoking history. He has never used smokeless tobacco. He reports that he does not drink alcohol and does not use drugs.  Allergies:  Allergies  Allergen Reactions   Demerol [Meperidine] Swelling    Swelling of hands, neck   Nsaids Other (See Comments)    Gi upset, Per patient he is able to take some NSAIDs.   Tapentadol Other (See Comments)    Skin crawling    Medications: Prior to Admission medications   Medication Sig Start Date End Date Taking? Authorizing Provider  amLODipine  (NORVASC ) 10 MG tablet Take 1 tablet (10 mg total) by mouth daily. 05/09/24  Yes King, Olivia, DO  b complex vitamins capsule Take 1 capsule by mouth daily.   Yes [provider]  carvedilol  (COREG ) 3.125 MG tablet Take 3.125 mg by mouth 2 (two) times daily. 05/18/24  Yes [provider]  diphenhydrAMINE (BENADRYL) 25 MG tablet Take 50 mg by mouth at bedtime.   Yes [provider]  ferrous sulfate  325 (65 FE) MG tablet Take 1 tablet (325 mg total) by mouth daily with breakfast. 03/21/24  Yes Pray, Rollene BRAVO, MD  furosemide  (LASIX ) 40 MG tablet Take 1 tablet (40 mg total) by mouth 2 (two) times daily. 05/09/24  Yes Pray, Rollene BRAVO, MD  magnesium (MAGTAB) 84 MG ( ) TBCR SR tablet Take 250 mg by mouth daily.   Yes [provider]  pantoprazole  (PROTONIX ) 40 MG tablet Take 1 tablet (40 mg total) by mouth 2 (two) times daily. 03/21/24  Yes Pray, Rollene BRAVO, MD  rosuvastatin  (CRESTOR ) 5 MG tablet Take 5 mg by mouth at bedtime.   Yes [provider]  sodium bicarbonate  650 MG tablet Take 1 tablet (650 mg total) by mouth 2 (two) times daily. 03/22/24  Yes Pray, Rollene BRAVO, MD  traMADol  (ULTRAM ) 50 MG tablet Take 1 tablet (50 mg total) by mouth every 6 (six) hours as needed. 01/12/23  Yes Pray, Rollene BRAVO, MD  zinc gluconate 50 MG tablet Take 50 mg by mouth daily.   Yes [provider]   carvedilol  (COREG ) 12.5 MG tablet Take 1 tablet (12.5 mg total) by mouth 2 (two) times daily with a meal. Patient not taking: Reported on 05/22/2024 12/15/22   Donzetta Rollene BRAVO, MD  Continuous Blood Gluc Receiver (DEXCOM G7 RECEIVER) DEVI Use to monitor blood sugar Patient not taking: Reported on 05/22/2024 03/11/22   Scarlet Elsie LABOR, MD  Continuous Blood Gluc Sensor (DEXCOM G7 SENSOR) MISC Use to monitor blood sugar Patient not taking: Reported on 05/22/2024 12/09/22   Donzetta Rollene BRAVO, MD  Multiple Vitamin (MULTIVITAMIN) tablet Take 1  tablet by mouth daily. Patient not taking: Reported on 05/22/2024    [provider]    I have reviewed the patient's current medications.  Labs:  Results for orders placed or performed during the hospital encounter of 05/22/24 (from the past 48 hours)  Comprehensive metabolic panel     Status: Abnormal   Collection Time: 05/22/24  8:37 AM  Result Value Ref Range   Sodium 138 135 - 145 mmol/L   Potassium 4.8 3.5 - 5.1 mmol/L   Chloride 104 98 - 111 mmol/L   CO2 22 22 - 32 mmol/L   Glucose, Bld 139 (H) 70 - 99 mg/dL    Comment: Glucose reference range applies only to samples taken after fasting for at least 8 hours.   BUN 61 (H) 6 - 20 mg/dL   Creatinine, Ser 5.23 (H) 0.61 - 1.24 mg/dL   Calcium  8.3 (L) 8.9 - 10.3 mg/dL   Total Protein 6.4 (L) 6.5 - 8.1 g/dL   Albumin 2.7 (L) 3.5 - 5.0 g/dL   AST 19 15 - 41 U/L   ALT 9 0 - 44 U/L   Alkaline Phosphatase 86 38 - 126 U/L   Total Bilirubin 0.5 0.0 - 1.2 mg/dL   GFR, Estimated 14 (L) >60 mL/min    Comment: (NOTE) Calculated using the CKD-EPI Creatinine Equation (2021)    Anion gap 12 5 - 15    Comment: Performed at [facility] Lab, [address], [place], [place] [address]  CBC with Differential     Status: None   Collection Time: 05/22/24  8:37 AM  Result Value Ref Range   WBC 8.3 4.0 - 10.5 K/uL   RBC 4.64 4.22 - 5.81 MIL/uL   Hemoglobin 13.1 13.0 - 17.0 g/dL   HCT 58.5 60.9 - 47.9 %    MCV 89.2 80.0 - 100.0 fL   MCH 28.2 26.0 - 34.0 pg   MCHC 31.6 30.0 - 36.0 g/dL   RDW 84.6 88.4 - 84.4 %   Platelets 360 150 - 400 K/uL   nRBC 0.0 0.0 - 0.2 %   Neutrophils Relative % 64 %   Neutro Abs 5.2 1.7 - 7.7 K/uL   Lymphocytes Relative 23 %   Lymphs Abs 1.9 0.7 - 4.0 K/uL   Monocytes Relative 8 %   Monocytes Absolute 0.7 0.1 - 1.0 K/uL   Eosinophils Relative 4 %   Eosinophils Absolute 0.4 0.0 - 0.5 K/uL   Basophils Relative 1 %   Basophils Absolute 0.1 0.0 - 0.1 K/uL   Immature Granulocytes 0 %   Abs Immature Granulocytes 0.03 0.00 - 0.07 K/uL    Comment: Performed at [facility], [address], [place], [place] [address]  Urinalysis, Routine w reflex microscopic -Urine, Clean Catch     Status: Abnormal   Collection Time: 05/22/24  8:37 AM  Result Value Ref Range   Color, Urine STRAW (A) YELLOW   APPearance CLEAR CLEAR   Specific Gravity, Urine 1.010 1.005 - 1.030   pH 6.0 5.0 - 8.0   Glucose, UA >=500 (A) NEGATIVE mg/dL   Hgb urine dipstick NEGATIVE NEGATIVE   Bilirubin Urine NEGATIVE NEGATIVE   Ketones, ur NEGATIVE NEGATIVE mg/dL   Protein, ur >=699 (A) NEGATIVE mg/dL   Nitrite NEGATIVE NEGATIVE   Leukocytes,Ua NEGATIVE NEGATIVE   RBC / HPF 0-5 0 - 5 RBC/hpf   WBC, UA 0-5 0 - 5 WBC/hpf   Bacteria, UA RARE (A) NONE SEEN  Squamous Epithelial / HPF 0-5 0 - 5 /HPF    Comment: Performed at [facility] Lab, [address] [place], [place] [address]  CBG monitoring, ED     Status: Abnormal   Collection Time: 05/22/24  8:44 AM  Result Value Ref Range   Glucose-Capillary 134 (H) 70 - 99 mg/dL    Comment: Glucose reference range applies only to samples taken after fasting for at least 8 hours.  Ammonia     Status: None   Collection Time: 05/22/24  8:52 AM  Result Value Ref Range   Ammonia <13 9 - 35 umol/L    Comment: Performed at [facility], [address] [place], [place] [address]  Lipid panel     Status: Abnormal   Collection Time: 05/23/24   1:30 AM  Result Value Ref Range   Cholesterol 273 (H) 0 - 200 mg/dL   Triglycerides 809 (H) <150 mg/dL   HDL 33 (L) >59 mg/dL   Total CHOL/HDL Ratio 8.3 RATIO   VLDL 38 0 - 40 mg/dL   LDL Cholesterol 797 (H) 0 - 99 mg/dL    Comment:        Total Cholesterol/HDL:CHD Risk Coronary Heart Disease Risk Table                     Men   Women  1/2 Average Risk   3.4   3.3  Average Risk       5.0   4.4  2 X Average Risk   9.6   7.1  3 X Average Risk  23.4   11.0        Use the calculated Patient Ratio above and the CHD Risk Table to determine the patient's CHD Risk.        ATP III CLASSIFICATION (LDL):  <100     mg/dL   Optimal  899-870  mg/dL   Near or Above                    Optimal  130-159  mg/dL   Borderline  839-810  mg/dL   High  >809     mg/dL   Very High Performed at [facility], [address] [place], [place] [address]   Renal function panel     Status: Abnormal   Collection Time: 05/23/24  1:30 AM  Result Value Ref Range   Sodium 137 135 - 145 mmol/L   Potassium 4.3 3.5 - 5.1 mmol/L   Chloride 105 98 - 111 mmol/L   CO2 21 (L) 22 - 32 mmol/L   Glucose, Bld 136 (H) 70 - 99 mg/dL    Comment: Glucose reference range applies only to samples taken after fasting for at least 8 hours.   BUN 60 (H) 6 - 20 mg/dL   Creatinine, Ser 5.28 (H) 0.61 - 1.24 mg/dL   Calcium  8.3 (L) 8.9 - 10.3 mg/dL   Phosphorus 6.2 (H) 2.5 - 4.6 mg/dL   Albumin 2.4 (L) 3.5 - 5.0 g/dL   GFR, Estimated 14 (L) >60 mL/min    Comment: (NOTE) Calculated using the CKD-EPI Creatinine Equation (2021)    Anion gap 11 5 - 15    Comment: Performed at [facility] Lab, [address] [place], [place] [address]  CBG monitoring, ED     Status: Abnormal   Collection Time: 05/23/24  1:36 AM  Result Value Ref Range   Glucose-Capillary 129 (H) 70 -  99 mg/dL    Comment: Glucose reference range applies only to samples taken after fasting for at least 8 hours.    ROS:  Pertinent items are noted in  HPI.  Physical Exam: Vitals:   05/23/24 1035 05/23/24 1100  BP:  (!) 152/96  Pulse:  88  Resp:  (!) 23  Temp: 97.6 F (36.4 C)   SpO2:  99%     General: WDWN man in NAD HEENT: No facial or periorbital edema Heart: RRR no MRG Abdomen: nondistended Extremities: trace LE edema in bilateral legs Skin: warm and dry Neuro: Aox3, nonfocal, speech is fluent  Assessment/Plan:  AKI on CKD 4 Nephrotic syndrome Needs Renal Biopsy Progressing renal failure with Cr 4.7 from 1.15 a year ago (Cr was 4 in late July) and GFR now in CKD5 range. Nephropathy also observed on outpatient renal workup, note ACR 7300 and PCR 14000 in June. Nephrotic syndrome also noted on notes from his care in [place]  from one year ago. Rapid disease progression is alarming and he would benefit from renal biopsy (currently scheduled 8/28).  At present cause of his disease is unclear but suspect secondary to diabetes, HTN, or even his ankylosing spondylitis. Prior negative studies include ANA, light chains, RPR, hepatitis. Furthermore cannot rule out a primary glomerular disease at this time, so biopsy needed. Today he does not have acute electrolyte abnormalities, volume overload, oliguria, and do not suspect uremia. No signs of infection.  - Will discuss with IR about biopsy. On discussion with neurology, pt to remain on asa for 3 months but plavix  can be held. - Will consider the addition of an ACE inhibitor at low dose to control proteinuria  during this admission. Will hold for now given AKI and permissive HTN window. - Monitor Daily I/Os, Daily weight  - Maintain MAP>65 for optimal renal perfusion.  - Avoid nephrotoxic agents such as IV contrast, NSAIDs, and phosphate containing bowel preps (FLEETS)  Hypertension  Euvolemic but with trace LE edema. Currently antihypertensives on hold for permissive HTN re stroke.   Acute Stroke HLD with likely contribution from nephrotic syndrome AMS - appears resolving Per  primary and neurology. Appreciate input for timing of biopsy. Plavix  can be held but pt will need to remain on aspirin . Will discuss this with IR.  Lonni Africa PGY 2  05/23/2024, 12:53 PM

## 2024-05-23 NOTE — Progress Notes (Addendum)
 FMTS Attending Daily Note:  Edward Legions MD, [facility] Personal pager:  [phone] FPTS Service Pager:  [phone]  I have seen and examined this patient and have reviewed their chart. I have discussed this patient with the medical student and resident. I agree with the medical student and resident's findings, assessment and care plan.  See H&P for further thoughts from today.  Edward JINNY Legions, MD, [facility]   ---------------------------------------------------------------------------       Daily Progress Note Intern Pager: [phone]  Patient name: Edward [name] Medical record number: [id number] Date of birth: 1970/10/21 Age: 53 y.o. Gender: male  Primary Care Provider: Donzetta Rollene BRAVO, MD Consultants: Neurology, Nephrology Code Status: Full  Pt Overview and Major Events to Date:  Admitted (8/10)  Assessment and Plan: Edward [name] is a 53 y.o. male with PMHx of HTN, HLD, DM2 with obesity, AS, and microalbuminuria presenting with AMS and left sided flank pain. Found to have a thalamic stroke seen on MRI brain and worsening renal functioning. Assessment & Plan Stroke [facility]) AMS (altered mental status) Acute ischemic nonhemorrhagic stroke involving the ventral left thalamus.  MRA findings with multiple sites moderate segment narrowing/stenosis. Clinically improving with better mentation since presenting to the ED. Neuro deficits appear to be memory and mood-regulation, with mild left face sensory reduction. - Neurology consulted: DAPT for 3 weeks followed by plavix  only - Pending UA, A1C - Echo is unremarkable - Medtele, Vital signs per floor - Fall precautions - Delirium precautions - PT/OT/SLP - Hold home BP medications d/t acute stroke, permissive HTN Chronic kidney disease, stage 3b (HCC) History of worsening renal function w/ nephrotic studies in past year with increasing creatinine increasing in past year from 1.15>3.98 Currently 4.76>4.71.  Follows up with Edward [name] with [facility] with plan for renal bx later this month. CT AP: Mild nonspecific perinephric soft tissue stranding bilaterally which could be secondary to underlying renal inflammation.  - Nephrology following: will coordinate with IR and neurology to determine if inpatient bx is possible considering patient is on DAPT as recommended by neurology for stroke - Hold IVF, will encourage PO, appears euvolemic on exam - Renal function panel daily - Renal diet  Hyperlipidemia Chol 273, HDL 33, LDL 202, Trig 190 (switching Rosuvastatin  5mg  > Atorvastatin  40mg  + Ezetimibe  10mg  due to possibly causing AS back pain and trial for high intensity statin for recent stroke. If unable to tolerate will consider other options) Chronic health problem Type 2 diabetes mellitus: Diet controlled  - Follow CBGs  - A1C pending Hypertension:  - Hold home carvedilol  in setting of permissive HTN  Ankylosing Spondylitis:  - Discontinue home tramadol  and tizanidine  due to kidney function - Oxycodone  2.5 q6 PRN Bipolar 1:  - Not currently on any medication, previously on lithium  d/c 4/24   FEN/GI: Renal diet PPx: Lovenox  Dispo:Home pending clinical improvement . Barriers include pending nephrology consult follow up.   Subjective:  53 year old male with mild confusion and left flank pain  Pertinent Past Medical History: Type 2 diabetes mellitus Hypertension Hyperlipidemia CKD stage IIIb-last CR 2.53 Ankylosing spondylitis Bipolar 1   Pertinent Past Surgical History: Roux-en-Y gastric bypass   Pertinent Social History: Tobacco use: Former, quit 4 weeks ago; ppd  Alcohol use: denies Other Substance use: cannabis  Lives with fiance    Pertinent Family History: None Remainder reviewed in history tab.    Important Outpatient Medications: Amlodipine  10 mg daily Farxiga 10 mg daily  carvedilol  3.125 mg twice  daily,  Benadryl 25 mg at night,  iron tablets 325,  furosemide   40 mg twice daily.   Magnesium 84 mg daily  pantoprazole  40 mg twice daily  rosuvastatin  5 mg daily,  bicarb 650 mg daily twice daily,  tramadol  50 mg every 6 as needed,  Zinc 50 mg daily Remainder reviewed in medication history.  Objective: Temp:  [97.7 F (36.5 C)-98.5 F (36.9 C)] 97.9 F (36.6 C) (08/10 2345) Pulse Rate:  [65-89] 65 (08/11 0650) Resp:  [13-25] 17 (08/11 0650) BP: (94-163)/(65-111) 98/73 (08/11 0650) SpO2:  [93 %-100 %] 98 % (08/11 0650) Weight:  [137.3 kg] 137.3 kg (08/10 0836) Physical Exam: General: No acute distress, sitting on chair Cardiovascular: RRR no murmurs Respiratory: CTAB breathing comfortably on room air Abdomen: No TTP, soft, non-distended Neuro: AxOx4. CN II-XII intact, describes tightness on left sided facial muscles and mild decrease in sensation. Strength 5/5 in all extremities. Reflexes 2+ in all extremities. Nose-to-finger negative. Heel-to-shin negative.  Laboratory: Most recent CBC Lab Results  Component Value Date   WBC 8.3 05/22/2024   HGB 13.1 05/22/2024   HCT 41.4 05/22/2024   MCV 89.2 05/22/2024   PLT 360 05/22/2024   Most recent BMP    Latest Ref Rng & Units 05/23/2024    1:30 AM  BMP  Glucose 70 - 99 mg/dL 863   BUN 6 - 20 mg/dL 60   Creatinine 9.38 - 1.24 mg/dL 5.28   Sodium 864 - 854 mmol/L 137   Potassium 3.5 - 5.1 mmol/L 4.3   Chloride 98 - 111 mmol/L 105   CO2 22 - 32 mmol/L 21   Calcium  8.9 - 10.3 mg/dL 8.3    Other pertinent labs  - A1C previously 9.9 in 10/22/20 pending recent results - Lipids: total cholesterol 273, HDL 33, LDL 202, triglycerides 190 (8/11 1:30AM) - Creatinine started to increase from 1.15>2.69>3.03>3.86>3.98 between 01/12/23-04/19/24. This admission 4.76>4.71 - BUN 60>61  Imaging/Diagnostic Tests: New today Radiologist Impression:  MRA HEAD:  1. Negative intracranial MRA for large vessel occlusion. 2. Moderate long segment narrowing/stenosis of the right P2 segment. 3. Focal  mild-to-moderate stenosis involving the proximal dominant left V4 segment.   MRA NECK: 1. Normal MRA of the neck.  Echo  1. Left ventricular ejection fraction, by estimation, is 60 to 65%. The  left ventricle has normal function. The left ventricle has no regional  wall motion abnormalities. Left ventricular diastolic parameters were  normal.   2. Right ventricular systolic function is normal. The right ventricular  size is normal.   3. The mitral valve is normal in structure. No evidence of mitral valve  regurgitation. No evidence of mitral stenosis.   4. The aortic valve is tricuspid. Aortic valve regurgitation is not  visualized. No aortic stenosis is present.   5. The inferior vena cava is normal in size with greater than 50%  respiratory variability, suggesting right atrial pressure of 3 mmHg.   6. Agitated saline contrast bubble study was negative, with no evidence  of any interatrial shunt.   ------------------------------------------------------------------------------------------------------- Previous day Radiologist Impression:  MRI brain: 1. 8 mm acute ischemic nonhemorrhagic infarct involving the ventral left thalamus. 2. Underlying age advanced chronic microvascular ischemic disease with a few scattered remote lacunar infarcts about the left basal ganglia and left thalamus.   CT Head: 1. No acute intracranial abnormality. 2. 2 separate remote lacunar infarcts within the left thalamus. 3. Subtle white matter hypoattenuation is mildly advanced for age. This may reflect underlying  microvascular ischemia.   CT abdomen pelvis: 1. No acute findings or explanation for the patient's symptoms. 2. No evidence of urinary tract calculus or hydronephrosis. Mild nonspecific perinephric soft tissue stranding bilaterally which could be secondary to underlying renal inflammation. Correlate clinically. 3. Cholelithiasis without evidence of cholecystitis or biliary ductal  dilatation. 4. 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 5.  Aortic Atherosclerosis  [name], Medical Student 05/23/2024, 7:57 AM  AI, [facility] FPTS Intern pager: [phone], text pages welcome Secure chat group [facility] Teaching Service

## 2024-05-23 NOTE — Progress Notes (Signed)
  Echocardiogram 2D Echocardiogram has been performed.  Tinnie FORBES Gosling RDCS 05/23/2024, 12:09 PM

## 2024-05-23 NOTE — Assessment & Plan Note (Signed)
 Acute ischemic nonhemorrhagic stroke involving the ventral left thalamus.  MRA findings with multiple sites moderate segment narrowing/stenosis. Clinically improving with better mentation since presenting to the ED. Neuro deficits appear to be memory and mood-regulation, with mild left face sensory reduction. - Neurology consulted: DAPT for 3 weeks followed by plavix  only - Pending UA, A1C - Echo is unremarkable - Medtele, Vital signs per floor - Fall precautions - Delirium precautions - PT/OT/SLP - Hold home BP medications d/t acute stroke, permissive HTN

## 2024-05-23 NOTE — Plan of Care (Signed)
 Spoke with nephrology given new stroke and concern for nephrotic syndrome, discussed he had planned outpatient biopsy but now on DAPT.  Nephrology will see patient, we will coordinate his care with IR and neurology to see if biopsy during this admission would be appropriate.  We greatly appreciate nephrology's assistance in this patient's care.

## 2024-05-23 NOTE — Evaluation (Signed)
 Physical Therapy Evaluation Patient Details Name: Edward [name] MRN: [id number] DOB: 12/13/1970 Today's Date: 05/23/2024  History of Present Illness  Pt is a 53 y.o. male presenting 8/10 with confusion for several days, L flank pain. MRI brain with 8 mm acute ischemic nonhemorrhagic infarct involving the ventral  left thalamus, age advanced chronic microvascular ischemic disease  with a few scattered remote lacunar infarcts about the left basal ganglia and left thalamus.  Also found to have worsening renal function. PMH: DMII, HTN, HLD, ankylosing spondylitis, bipolar 1, CKD IIIb.  Clinical Impression  Pt admitted with above diagnosis. Independent and working PTA. Occasional use of quad cane for AS flares. During evaluation pt functioning at a supervision to mod I level with mobility. BERG indicates moderate fall risk 51/56; DGI indicates low fall risk 21/24. Anticipate good progress functionally. 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 Pt currently with functional limitations due to the deficits listed below (see PT Problem List). Pt will benefit from acute skilled PT to increase their independence and safety with mobility to allow discharge.           If plan is discharge home, recommend the following: Assistance with cooking/housework;Assist for transportation;Help with stairs or ramp for entrance   Can travel by private vehicle        Equipment Recommendations None recommended by PT  Recommendations for Other Services       Functional Status Assessment Patient has had a recent decline in their functional status and demonstrates the ability to make significant improvements in function in a reasonable and predictable amount of time.     Precautions / Restrictions Restrictions Weight Bearing Restrictions Per Provider Order: No      Mobility  Bed Mobility                General bed mobility comments: in recliner    Transfers Overall transfer level: Needs assistance Equipment used: None Transfers: Sit to/from Stand Sit to Stand: Supervision           General transfer comment: supervision for safety, no physical assist required. able to stablize without UE support.    Ambulation/Gait Ambulation/Gait assistance: Supervision Gait Distance (Feet): 115 Feet Assistive device: None Gait Pattern/deviations: Step-through pattern, Decreased stance time - right, Drifts right/left Gait velocity: dec Gait velocity interpretation: <1.8 ft/sec, indicate of risk for recurrent falls   General Gait Details: Minimal instability noted. Tolerated DGI without overt LOB, minimal drift, no buckling. Educated on safety, awareness.  Stairs            Wheelchair Mobility     Tilt Bed    Modified Rankin (Stroke Patients Only) Modified Rankin (Stroke Patients Only) Pre-Morbid Rankin Score: No significant disability Modified Rankin: Moderate disability     Balance Overall balance assessment: Modified Independent                               Standardized Balance Assessment Standardized Balance Assessment : Berg Balance Test, Dynamic Gait Index Berg Balance Test Sit to Stand: Able to stand without using hands and stabilize independently Standing Unsupported: Able to stand safely 2 minutes Sitting with Back Unsupported but Feet Supported on Floor or Stool: Able to sit safely and securely 2 minutes Stand to Sit: Sits safely with minimal use of hands Transfers: Able to transfer safely, minor use of  hands Standing Unsupported with Eyes Closed: Able to stand 10 seconds safely Standing Ubsupported with Feet Together: Able to place feet together independently and stand 1 minute safely From Standing, Reach Forward with Outstretched Arm: Can reach forward >12 cm safely (5) From Standing Position, Pick up Object from Floor: Able to pick up shoe  safely and easily From Standing Position, Turn to Look Behind Over each Shoulder: Looks behind from both sides and weight shifts well Turn 360 Degrees: Able to turn 360 degrees safely in 4 seconds or less Standing Unsupported, Alternately Place Feet on Step/Stool: Able to stand independently and complete 8 steps >20 seconds Standing Unsupported, One Foot in Front: Able to plae foot ahead of the other independently and hold 30 seconds Standing on One Leg: Able to lift leg independently and hold equal to or more than 3 seconds Total Score: 51 Dynamic Gait Index Level Surface: Normal Change in Gait Speed: Mild Impairment Gait with Horizontal Head Turns: Normal Gait with Vertical Head Turns: Normal Gait and Pivot Turn: Normal Step Over Obstacle: Mild Impairment Step Around Obstacles: Normal Steps: Mild Impairment Total Score: 21       Pertinent Vitals/Pain Pain Assessment Pain Assessment: Faces Faces Pain Scale: Hurts little more Pain Location: back Pain Descriptors / Indicators: Aching Pain Intervention(s): Limited activity within patient's tolerance, Monitored during session    Home Living Family/patient expects to be discharged to:: Private residence Living Arrangements: Spouse/significant other Available Help at Discharge: Family;Friend(s);Available 24 hours/day (States fiance works but can arrange 24/7 care if needed.) Type of Home: House Home Access: Stairs to enter Entrance Stairs-Rails: None Secretary/administrator of Steps: 1   Home Layout: One level Home Equipment: Cane - quad      Prior Function Prior Level of Function : Independent/Modified Independent;Driving             Mobility Comments: ind, occasional use of quad cane if AS flares up. 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     Extremity/Trunk Assessment   Upper Extremity Assessment Upper Extremity Assessment: Defer to OT evaluation;Right hand dominant    Lower  Extremity Assessment Lower Extremity Assessment: Overall WFL for tasks assessed       Communication   Communication Communication: Impaired Factors Affecting Communication: Reduced clarity of speech    Cognition Arousal: Alert Behavior During Therapy: WFL for tasks assessed/performed   PT - Cognitive impairments: No family/caregiver present to determine baseline, Memory                       PT - Cognition Comments: Oriented Following commands: Intact       Cueing Cueing Techniques: Verbal cues     General Comments General comments (skin integrity, edema, etc.): VSS    Exercises     Assessment/Plan    PT Assessment Patient needs continued PT services  PT Problem List Decreased range of motion;Decreased balance;Decreased mobility;Decreased knowledge of use of DME;Decreased cognition       PT Treatment Interventions DME instruction;Gait training;Stair training;Functional mobility training;Therapeutic activities;Therapeutic exercise;Balance training;Neuromuscular re-education;Cognitive remediation;Patient/family education    PT Goals (Current goals can be found in the Care Plan section)  Acute Rehab PT Goals Patient Stated Goal: Get well, return to work PT Goal Formulation: With patient Time For Goal Achievement: 06/06/24 Potential to Achieve Goals: Good Additional Goals Additional Goal #1: BERG >52 to indicate low fall risk    Frequency Min 2X/week     Co-evaluation  AM-PAC PT 6 Clicks Mobility  Outcome Measure Help needed turning from your back to your side while in a flat bed without using bedrails?: None Help needed moving from lying on your back to sitting on the side of a flat bed without using bedrails?: None Help needed moving to and from a bed to a chair (including a wheelchair)?: None Help needed standing up from a chair using your arms (e.g., wheelchair or bedside chair)?: A Little Help needed to walk in hospital room?:  A Little Help needed climbing 3-5 steps with a railing? : A Little 6 Click Score: 21    End of Session   Activity Tolerance: Patient tolerated treatment well Patient left: in chair;with call bell/phone within reach Nurse Communication: Mobility status PT Visit Diagnosis: Unsteadiness on feet (R26.81);Other abnormalities of gait and mobility (R26.89);Other symptoms and signs involving the nervous system (R29.898)    Time: 9073-9050 PT Time Calculation (min) (ACUTE ONLY): 23 min   Charges:   PT Evaluation $PT Eval Low Complexity: 1 Low PT Treatments $Gait Training: 8-22 mins PT General Charges $$ ACUTE PT VISIT: 1 Visit         Leontine Roads, PT, DPT [facility]  Rehabilitation Services Physical Therapist Office: [phone] Website: [email or web address]   Leontine GORMAN Roads 05/23/2024, 10:49 AM

## 2024-05-23 NOTE — Plan of Care (Signed)

## 2024-05-23 NOTE — Progress Notes (Addendum)
 STROKE TEAM PROGRESS NOTE    SIGNIFICANT HOSPITAL EVENTS 8/10: Patient presented with a 6-day history of confusion, accompanied by hesitancy and word-finding difficulty.  CT revealed a left ventral thalamic infarct.  SUBJECTIVE (INTERIM HISTORY) Patient is alone in ED, awaiting open bed. Patient is alert and in no acute distress. Since admission, patient's cognition and memory have improved, with better fluency and recall noted today. Today he reports numbness involving left face, arm, and leg. Denies new weakness, vision changes, headache, dizziness, chest pain, or shortness of breath. No acute events overnight. Patient was consulted about possible OSA and a sleep smart study trial for patients with OSA with history of a stroke.    OBJECTIVE  CBC    Component Value Date/Time   WBC 8.3 05/22/2024 0837   RBC 4.64 05/22/2024 0837   HGB 13.1 05/22/2024 0837   HGB 11.3 (L) 03/21/2024 1037   HCT 41.4 05/22/2024 0837   HCT 36.2 (L) 03/21/2024 1037   PLT 360 05/22/2024 0837   PLT 338 03/21/2024 1037   MCV 89.2 05/22/2024 0837   MCV 93 03/21/2024 1037   MCH 28.2 05/22/2024 0837   MCHC 31.6 05/22/2024 0837   RDW 15.3 05/22/2024 0837   RDW 14.2 03/21/2024 1037   LYMPHSABS 1.9 05/22/2024 0837   MONOABS 0.7 05/22/2024 0837   EOSABS 0.4 05/22/2024 0837   BASOSABS 0.1 05/22/2024 0837    BMET    Component Value Date/Time   NA 137 05/23/2024 0130   NA 141 05/06/2024 1552   K 4.3 05/23/2024 0130   CL 105 05/23/2024 0130   CO2 21 (L) 05/23/2024 0130   GLUCOSE 136 (H) 05/23/2024 0130   BUN 60 (H) 05/23/2024 0130   BUN 52 (H) 05/06/2024 1552   CREATININE 4.71 (H) 05/23/2024 0130   CREATININE 1.47 (H) 11/07/2021 0903   CALCIUM  8.3 (L) 05/23/2024 0130   EGFR 18 (L) 05/06/2024 1552   GFRNONAA 14 (L) 05/23/2024 0130   GFRNONAA >89 05/02/2016 0925    IMAGING past 24 hours MR ANGIO HEAD WO CONTRAST Result Date: 05/23/2024 CLINICAL DATA:  Follow-up examination for stroke. EXAM: MRA NECK  WITHOUT CONTRAST MRA HEAD WITHOUT CONTRAST TECHNIQUE: Angiographic images of the Circle of Willis were acquired using MRA technique without intravenous contrast. COMPARISON:  None Available. FINDINGS: MRA NECK FINDINGS Aortic arch: Visualized aortic arch within normal limits for caliber with standard branch pattern. No visible stenosis or other abnormality about the origin the great vessels. Right carotid system: Right common and internal carotid arteries are patent with antegrade flow. No evidence for dissection. No hemodynamically significant stenosis about the right carotid artery system. Left carotid system: Left common and internal carotid arteries are patent with antegrade flow. No evidence for dissection. No hemodynamically significant stenosis about the left carotid artery system. Vertebral arteries: Both vertebral arteries arise from subclavian arteries. Left vertebral artery dominant. Vertebral arteries are patent with antegrade flow. No evidence for dissection or stenosis. Other: None. MRA HEAD FINDINGS Anterior circulation: Both internal carotid arteries are widely patent through the siphons without stenosis or other abnormality. A1 segments patent bilaterally. Normal anterior communicating artery complex. Anterior cerebral arteries patent without significant stenosis. No M1 stenosis or occlusion. No proximal MCA branch occlusion. Distal MCA branches perfused and symmetric. Posterior circulation: Focal mild-to-moderate stenosis seen involving the proximal dominant left V4 segment (series 1030, image 1). Mildly heterogeneous signal intensity within the left vertebral artery at this level suspected to be related to turbulent flow. Left PICA patent. Diminutive right  vertebral artery largely terminates in PICA, although a tiny branch is seen ascending towards the vertebrobasilar junction. Right PICA patent as well. Basilar patent without stenosis. Superior cerebral arteries patent bilaterally. Both PCAs  primarily supplied via the basilar. Left PCA patent to its distal aspect without stenosis. Moderate long segment narrowing/stenosis of the right P2 segment (series 1024, image 3). Right PCA remains patent to its distal aspect. Anatomic variants: As above. Other: No intracranial aneurysm. IMPRESSION: MRA HEAD: 1. Negative intracranial MRA for large vessel occlusion. 2. Moderate long segment narrowing/stenosis of the right P2 segment. 3. Focal mild-to-moderate stenosis involving the proximal dominant left V4 segment. MRA NECK: Normal MRA of the neck. Electronically Signed   By: Morene Hoard M.D.   On: 05/23/2024 02:05   MR ANGIO NECK WO CONTRAST Result Date: 05/23/2024 CLINICAL DATA:  Follow-up examination for stroke. EXAM: MRA NECK WITHOUT CONTRAST MRA HEAD WITHOUT CONTRAST TECHNIQUE: Angiographic images of the Circle of Willis were acquired using MRA technique without intravenous contrast. COMPARISON:  None Available. FINDINGS: MRA NECK FINDINGS Aortic arch: Visualized aortic arch within normal limits for caliber with standard branch pattern. No visible stenosis or other abnormality about the origin the great vessels. Right carotid system: Right common and internal carotid arteries are patent with antegrade flow. No evidence for dissection. No hemodynamically significant stenosis about the right carotid artery system. Left carotid system: Left common and internal carotid arteries are patent with antegrade flow. No evidence for dissection. No hemodynamically significant stenosis about the left carotid artery system. Vertebral arteries: Both vertebral arteries arise from subclavian arteries. Left vertebral artery dominant. Vertebral arteries are patent with antegrade flow. No evidence for dissection or stenosis. Other: None. MRA HEAD FINDINGS Anterior circulation: Both internal carotid arteries are widely patent through the siphons without stenosis or other abnormality. A1 segments patent bilaterally.  Normal anterior communicating artery complex. Anterior cerebral arteries patent without significant stenosis. No M1 stenosis or occlusion. No proximal MCA branch occlusion. Distal MCA branches perfused and symmetric. Posterior circulation: Focal mild-to-moderate stenosis seen involving the proximal dominant left V4 segment (series 1030, image 1). Mildly heterogeneous signal intensity within the left vertebral artery at this level suspected to be related to turbulent flow. Left PICA patent. Diminutive right vertebral artery largely terminates in PICA, although a tiny branch is seen ascending towards the vertebrobasilar junction. Right PICA patent as well. Basilar patent without stenosis. Superior cerebral arteries patent bilaterally. Both PCAs primarily supplied via the basilar. Left PCA patent to its distal aspect without stenosis. Moderate long segment narrowing/stenosis of the right P2 segment (series 1024, image 3). Right PCA remains patent to its distal aspect. Anatomic variants: As above. Other: No intracranial aneurysm. IMPRESSION: MRA HEAD: 1. Negative intracranial MRA for large vessel occlusion. 2. Moderate long segment narrowing/stenosis of the right P2 segment. 3. Focal mild-to-moderate stenosis involving the proximal dominant left V4 segment. MRA NECK: Normal MRA of the neck. Electronically Signed   By: Morene Hoard M.D.   On: 05/23/2024 02:05   MR BRAIN WO CONTRAST Result Date: 05/22/2024 CLINICAL DATA:  Follow-up examination for stroke. EXAM: MRI HEAD WITHOUT CONTRAST TECHNIQUE: Multiplanar, multiecho pulse sequences of the brain and surrounding structures were obtained without intravenous contrast. COMPARISON:  CT from earlier the same day FINDINGS: Brain: Cerebral volume within normal limits. Patchy T2/FLAIR hyperintensity involving the periventricular and deep white matter both cerebral hemispheres as well as the pons, most like related chronic microvascular ischemic disease, moderate in  nature and advanced for age. Few scattered  superimposed remote lacunar infarcts present about the left basal ganglia and left thalamus. 8 mm acute ischemic nonhemorrhagic ends are seen involving the ventral left thalamus (series 5, image 81). No significant mass effect. No other evidence for acute or subacute ischemia. Gray-white matter differentiation maintained. No acute intracranial hemorrhage. Focus of chronic hemosiderin staining present at the para falcine frontal parietal region (series 12, image 53), consistent with a small focus of chronic hemorrhage. No mass lesion, midline shift or mass effect. No hydrocephalus or extra-axial fluid collection. Pituitary gland within normal limits. Vascular: Right vertebral artery hypoplastic and not well seen. Major intracranial vascular flow voids are otherwise maintained. Skull and upper cervical spine: Cranial junction within normal limits. Bone marrow signal intensity normal. No scalp soft tissue abnormality. Sinuses/Orbits: Globes and orbital soft tissues within normal limits. Paranasal sinuses and mastoid air cells are largely clear. Other: None. IMPRESSION: 1. 8 mm acute ischemic nonhemorrhagic infarct involving the ventral left thalamus. 2. Underlying age advanced chronic microvascular ischemic disease with a few scattered remote lacunar infarcts about the left basal ganglia and left thalamus. Electronically Signed   By: Morene Hoard M.D.   On: 05/22/2024 17:48   CT ABDOMEN PELVIS WO CONTRAST Result Date: 05/22/2024 CLINICAL DATA:  Acute renal failure. EXAM: CT ABDOMEN AND PELVIS WITHOUT CONTRAST TECHNIQUE: Multidetector CT imaging of the abdomen and pelvis was performed following the standard protocol without IV contrast. RADIATION DOSE REDUCTION: This exam was performed according to the departmental dose-optimization program which includes automated exposure control, adjustment of the mA and/or kV according to patient size and/or use of iterative  reconstruction technique. COMPARISON:  Renal ultrasound 05/09/2024. Report only from remote abdominopelvic CT 12/12/2014. Pelvic and right hip radiographs 05/15/2021. FINDINGS: Lower chest: Clear lung bases. No significant pleural or pericardial effusion. Hepatobiliary: The liver appears unremarkable as imaged in the noncontrast state. No morphologic changes of cirrhosis or focal abnormalities are identified. There is 1.9 cm peripherally calcified gallstone. No evidence of gallbladder wall thickening, surrounding inflammation or biliary ductal dilatation. Pancreas: Unremarkable. No pancreatic ductal dilatation or surrounding inflammatory changes. Spleen: Normal in size without focal abnormality. Adrenals/Urinary Tract: Both adrenal glands appear normal. No evidence of urinary tract calculus, hydronephrosis or focal renal abnormality on noncontrast imaging. There is mild symmetric perinephric soft tissue stranding bilaterally. The bladder appears unremarkable for its degree of distention. Stomach/Bowel: No enteric contrast administered. There are postsurgical changes consistent with Roux-en-Y gastric bypass. No bowel distension, wall thickening or surrounding inflammation. The appendix appears normal. Mildly prominent stool throughout the colon. Vascular/Lymphatic: There are no enlarged abdominal or pelvic lymph nodes. Small retroperitoneal lymph nodes are not pathologically enlarged. Mild aortoiliac atherosclerosis. Reproductive: The prostate gland and seminal vesicles appear unremarkable. Other: 1.4 cm peripherally calcified nodule in the superior omentum (image 32/3), likely a focus of remote fat necrosis or inflammation. Mildly prominent fat in both inguinal canals. No ascites or pneumoperitoneum. Musculoskeletal: No acute or significant osseous findings. Multilevel spondylosis with chronic appearing osseous foraminal narrowing bilaterally at L5-S1. Probable postsurgical or posttraumatic findings in the lateral  aspect of the left iliac crest, unchanged from prior radiographs. The sacroiliac joints appear partially ankylosed bilaterally. IMPRESSION: 1. No acute findings or explanation for the patient's symptoms. 2. No evidence of urinary tract calculus or hydronephrosis. Mild nonspecific perinephric soft tissue stranding bilaterally which could be secondary to underlying renal inflammation. Correlate clinically. 3. Cholelithiasis without evidence of cholecystitis or biliary ductal dilatation. 4. 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 5.  Aortic Atherosclerosis (  ICD10-I70.0). Electronically Signed   By: Elsie Perone M.D.   On: 05/22/2024 12:16   CT Head Wo Contrast Result Date: 05/22/2024 EXAM: CT HEAD WITHOUT CONTRAST 05/22/2024 11:53:52 AM TECHNIQUE: CT of the head was performed without the administration of intravenous contrast. Automated exposure control, iterative reconstruction, and/or weight based adjustment of the mA/kV was utilized to reduce the radiation dose to as low as reasonably achievable. COMPARISON: None available. CLINICAL HISTORY: Mental status change, unknown cause. FINDINGS: BRAIN AND VENTRICLES: No acute hemorrhage. Gray-white differentiation is preserved. No hydrocephalus. No extra-axial collection. No mass effect or midline shift. 2 separate remote lacunar infarcts are present within the left thalamus. Subtle white matter hypoattenuation is mildly advanced for age. ORBITS: No acute abnormality. SINUSES: No acute abnormality. SOFT TISSUES AND SKULL: No acute soft tissue abnormality. No skull fracture. IMPRESSION: 1. No acute intracranial abnormality. 2. 2 separate remote lacunar infarcts within the left thalamus. 3. Subtle white matter hypoattenuation is mildly advanced for age. This may reflect underlying microvascular ischemia. Electronically signed by: Lonni Necessary MD 05/22/2024 12:11 PM EDT RP Workstation: [id number]    Vitals:   05/22/24 2222 05/22/24 2345  05/23/24 0228 05/23/24 0650  BP: 122/82  (!) 145/95 98/73  Pulse: 78  71 65  Resp: (!) 23  13 17   Temp:  97.9 F (36.6 C)    TempSrc:  Oral    SpO2: 96%  97% 98%  Weight:      Height:         PHYSICAL EXAM General:  Alert, well-nourished, well-developed patient in no acute distress CV: Regular rate and rhythm on monitor Respiratory:  Regular, unlabored respirations on room air   NEURO:  Mental Status: AA&Ox3, patient is able to give clear and coherent history, memory has improved since admission Speech/Language: speech is without dysarthria or aphasia.  Naming, repetition, fluency, and comprehension intact. No hesitancy or difficulty word finding.   Cranial Nerves:  II: PERRL. Visual fields full.  III, IV, VI: EOMI. Eyelids elevate symmetrically.  V: Decreased sensation to light tough in all three division on the left VII: Face is symmetrical resting and smiling VIII: hearing intact to voice. IX, X: Palate elevates symmetrically. Phonation is normal.  KP:Dynloizm shrug 5/5. XII: tongue is midline without fasciculations. Motor: 5/5 strength to all muscle groups tested.  Tone: is normal and bulk is normal Sensation-Decreased light touch sensation involving L face, LUE, and LLE  Coordination: FTN intact bilaterally, HKS: no ataxia in BLE.No drift.  Gait- deferred   ASSESSMENT/PLAN  [name] is a 53 y.o. male with history of CKD, HTN, DMII, Bipolar 1, and ankylosing spondylitis, admitted for acute confusion that began Tuesday and markedly worsened Friday. NIH on Admission 5.  Since admission, theres has been marked improvement in semantic fluency, memory, and language function. On examination today, he was able to name 15 animals with four legs within the allotted time, recall three objects after delay, and correctly name multiple objects in the room. He is no longer exhibiting hesitancy or word finding difficulty. Sensory examination revealed decreased light touch  sensation over the entire left side. No new motor deficits noted. We are awaiting nephrology input regarding Plavix  given planned renal biopsy. Continue stroke monitoring.  Acute Ischemic Infarct:  ventral left thalamus Etiology:  Hypertension, Hyperlipidemia, Smoking history Code Stroke CT head No acute abnormality. 2 separate remote lacunar infarcts within L thalamus. Subtle white matter hypoattenuation mildly advanced for age MRI  8 mm acute ischemic nonhemorrhagic infarct involving  ventral left thalamus. Underlying age advanced chronic microvascular ischemic disease with scattered remote lacunar infarcts about L basal ganglia and L thalamus MRA  Negative for LVO, moderate long segment narrowing/stenosis of the right P2 segment. Focal mild-to-moderate stenosis involving the proximal dominant L V4 segment MRA neck Normal  2D Echo ejection fraction 60 to 65%.  No wall motion abnormalities.  Left atrial size normal. LDL 202 HgbA1c pending VTE prophylaxis - Lovenox  No antithrombotic prior to admission, now on aspirin  81 mg daily and clopidogrel  75 mg daily for 3 weeks and then clopidogrel  alone. Plavix  pending d/c post nephro consult as patient may need renal biopsy.. Therapy recommendations:  Pending Disposition:  Pending   CKD, III 3b Renal Biopsy pending, scheduled for 8/28 outpatient Nephro consult placed, continuing Plavix  until further notice Baseline Cr 2-2.5 Cr 4.71, GFR 14  Hypertension Home meds: Amlodipine  10 mg, Carvedilol  3.125, hold in setting of permissive HTN Stable Blood Pressure Goal: SBP less than 160   Hyperlipidemia Home meds:  Rosuvastatin , not resumed  Add Atrovastatin 40 mg LDL 202, goal < 70 Continue statin at discharge  Diabetes type II Uncontrolled Diet controlled, nonadherent to metformin   HbA1c pending, previous HbA1c 8.4 (01/2023) HgbA1c goal < 7.0 CBGs SSI Recommend close follow-up with PCP for better DM control  Tobacco Abuse Former smoker,  quit 1 month ago Previous 2.5 pack-year for 10 years, tobacco   Dysphagia Patient has post-stroke dysphagia, SLP consulted    Diet   Diet renal with fluid restriction Fluid restriction: 1200 mL Fluid; Room service appropriate? Yes; Fluid consistency: Thin   Advance diet as tolerated  Other Stroke Risk Factors Obesity, Body mass index is 42.22 kg/m., BMI >/= 30 associated with increased stroke risk, recommend weight loss, diet and exercise as appropriate  Family hx stroke (Maternal grandmother) Coronary artery disease   Other Active Problems Ankylosis spondylitis Bipolar 1  Hospital day # 1  I have personally obtained history,examined this patient, reviewed notes, independently viewed imaging studies, participated in medical decision making and plan of care.ROS completed by me personally and pertinent positives fully documented  I have made any additions or clarifications directly to the above note. Agree with note above.  Patient continues to have left-sided paresthesias with otherwise is doing well in his memory cognition appears to have improved.  Patient has renal insufficiency and is pending kidney biopsy.  Will not continue Plavix  but continue aspirin  alone and if biopsy can be done on aspirin  you can do this during this admission.  If however aspirin  has to be held would recommend waiting at least 2 to 3 months which is the maximum time for  recurrent strokes if possible.  Maintain aggressive risk factor modification with strict control of hypertension with blood pressure goal below 130/90, lipids with LDL cholesterol goal below 70 mg percent and diabetes hemoglobin A1c goal below 6.5%.  He was also advised to eat a healthy diet with lots of fruits, vegetables, cereals, whole grains and to avoid animal meat.  Discussed with primary team, nephrology and patient's fianc and answered questions. Patient also appears to be at risk for obstructive sleep apnea as a his history of snoring  and the onset feels he quits breathing.  He may consider possible participation in sleep smart stroke prevention study if interested.  He was given written information to review and decide.  It was made clear study participation is voluntary and he can withdraw from the study at any point in the future if not  satisfied.  No study specific procedure was obtained before informed consent was signed. skip Eather Popp, MD Medical Director [facility] Pager: [phone] 05/23/2024 3:48 PM    To contact Stroke Continuity provider, please refer to [email or web address]. After hours, contact General Neurology

## 2024-05-23 NOTE — Evaluation (Signed)
 Physical Therapy Evaluation Patient Details Name: Edward [name] MRN: [id number] DOB: 1971-06-07 Today's Date: 05/23/2024  History of Present Illness  Pt is a 53 y.o. male presenting 8/10 with confusion for several days, L flank pain. MRI brain with 8 mm acute ischemic nonhemorrhagic infarct involving the ventral  left thalamus, age advanced chronic microvascular ischemic disease  with a few scattered remote lacunar infarcts about the left basal ganglia and left thalamus.  Also found to have worsening renal function. PMH: DMII, HTN, HLD, ankylosing spondylitis, bipolar 1, CKD IIIb.  Clinical Impression  Pt admitted with above diagnosis. Independent and working PTA. Occasional use of quad cane for AS flares. During evaluation pt functioning at a supervision to mod I level with mobility. BERG indicates moderate fall risk 51/56; DGI indicates low fall risk 21/24. Anticipate good progress functionally. 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 Pt currently with functional limitations due to the deficits listed below (see PT Problem List). Pt will benefit from acute skilled PT to increase their independence and safety with mobility to allow discharge.           If plan is discharge home, recommend the following: Assistance with cooking/housework;Assist for transportation;Help with stairs or ramp for entrance   Can travel by private vehicle        Equipment Recommendations None recommended by PT  Recommendations for Other Services       Functional Status Assessment Patient has had a recent decline in their functional status and demonstrates the ability to make significant improvements in function in a reasonable and predictable amount of time.     Precautions / Restrictions Restrictions Weight Bearing Restrictions Per Provider Order: No      Mobility  Bed Mobility                General bed mobility comments: in recliner    Transfers Overall transfer level: Needs assistance Equipment used: None Transfers: Sit to/from Stand Sit to Stand: Supervision           General transfer comment: supervision for safety, no physical assist required. able to stablize without UE support.    Ambulation/Gait Ambulation/Gait assistance: Supervision Gait Distance (Feet): 115 Feet Assistive device: None Gait Pattern/deviations: Step-through pattern, Decreased stance time - right, Drifts right/left Gait velocity: dec Gait velocity interpretation: <1.8 ft/sec, indicate of risk for recurrent falls   General Gait Details: Minimal instability noted. Tolerated DGI without overt LOB, minimal drift, no buckling. Educated on safety, awareness.  Stairs            Wheelchair Mobility     Tilt Bed    Modified Rankin (Stroke Patients Only) Modified Rankin (Stroke Patients Only) Pre-Morbid Rankin Score: No significant disability Modified Rankin: Moderate disability     Balance Overall balance assessment: Modified Independent                               Standardized Balance Assessment Standardized Balance Assessment : Berg Balance Test, Dynamic Gait Index Berg Balance Test Sit to Stand: Able to stand without using hands and stabilize independently Standing Unsupported: Able to stand safely 2 minutes Sitting with Back Unsupported but Feet Supported on Floor or Stool: Able to sit safely and securely 2 minutes Stand to Sit: Sits safely with minimal use of hands Transfers: Able to transfer safely, minor use of  hands Standing Unsupported with Eyes Closed: Able to stand 10 seconds safely Standing Ubsupported with Feet Together: Able to place feet together independently and stand 1 minute safely From Standing, Reach Forward with Outstretched Arm: Can reach forward >12 cm safely (5) From Standing Position, Pick up Object from Floor: Able to pick up shoe  safely and easily From Standing Position, Turn to Look Behind Over each Shoulder: Looks behind from both sides and weight shifts well Turn 360 Degrees: Able to turn 360 degrees safely in 4 seconds or less Standing Unsupported, Alternately Place Feet on Step/Stool: Able to stand independently and complete 8 steps >20 seconds Standing Unsupported, One Foot in Front: Able to plae foot ahead of the other independently and hold 30 seconds Standing on One Leg: Able to lift leg independently and hold equal to or more than 3 seconds Total Score: 51 Dynamic Gait Index Level Surface: Normal Change in Gait Speed: Mild Impairment Gait with Horizontal Head Turns: Normal Gait with Vertical Head Turns: Normal Gait and Pivot Turn: Normal Step Over Obstacle: Mild Impairment Step Around Obstacles: Normal Steps: Mild Impairment Total Score: 21       Pertinent Vitals/Pain Pain Assessment Pain Assessment: Faces Faces Pain Scale: Hurts little more Pain Location: back Pain Descriptors / Indicators: Aching Pain Intervention(s): Limited activity within patient's tolerance, Monitored during session    Home Living Family/patient expects to be discharged to:: Private residence Living Arrangements: Spouse/significant other Available Help at Discharge: Family;Friend(s);Available 24 hours/day (States fiance works but can arrange 24/7 care if needed.) Type of Home: House Home Access: Stairs to enter Entrance Stairs-Rails: None Secretary/administrator of Steps: 1   Home Layout: One level Home Equipment: Cane - quad      Prior Function Prior Level of Function : Independent/Modified Independent;Driving             Mobility Comments: ind, occasional use of quad cane if AS flares up. 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     Extremity/Trunk Assessment   Upper Extremity Assessment Upper Extremity Assessment: Defer to OT evaluation;Right hand dominant    Lower  Extremity Assessment Lower Extremity Assessment: Overall WFL for tasks assessed       Communication   Communication Communication: Impaired Factors Affecting Communication: Reduced clarity of speech    Cognition Arousal: Alert Behavior During Therapy: WFL for tasks assessed/performed   PT - Cognitive impairments: No family/caregiver present to determine baseline, Memory                       PT - Cognition Comments: Oriented Following commands: Intact       Cueing Cueing Techniques: Verbal cues     General Comments General comments (skin integrity, edema, etc.): VSS    Exercises     Assessment/Plan    PT Assessment Patient needs continued PT services  PT Problem List Decreased range of motion;Decreased balance;Decreased mobility;Decreased knowledge of use of DME;Decreased cognition       PT Treatment Interventions DME instruction;Gait training;Stair training;Functional mobility training;Therapeutic activities;Therapeutic exercise;Balance training;Neuromuscular re-education;Cognitive remediation;Patient/family education    PT Goals (Current goals can be found in the Care Plan section)  Acute Rehab PT Goals Patient Stated Goal: Get well, return to work PT Goal Formulation: With patient Time For Goal Achievement: 06/06/24 Potential to Achieve Goals: Good Additional Goals Additional Goal #1: BERG >52 to indicate low fall risk    Frequency Min 2X/week     Co-evaluation  AM-PAC PT 6 Clicks Mobility  Outcome Measure Help needed turning from your back to your side while in a flat bed without using bedrails?: None Help needed moving from lying on your back to sitting on the side of a flat bed without using bedrails?: None Help needed moving to and from a bed to a chair (including a wheelchair)?: None Help needed standing up from a chair using your arms (e.g., wheelchair or bedside chair)?: A Little Help needed to walk in hospital room?:  A Little Help needed climbing 3-5 steps with a railing? : A Little 6 Click Score: 21    End of Session   Activity Tolerance: Patient tolerated treatment well Patient left: in chair;with call bell/phone within reach Nurse Communication: Mobility status PT Visit Diagnosis: Unsteadiness on feet (R26.81);Other abnormalities of gait and mobility (R26.89);Other symptoms and signs involving the nervous system (R29.898)    Time: 9073-9050 PT Time Calculation (min) (ACUTE ONLY): 23 min   Charges:   PT Evaluation $PT Eval Low Complexity: 1 Low PT Treatments $Gait Training: 8-22 mins PT General Charges $$ ACUTE PT VISIT: 1 Visit         Leontine Roads, PT, DPT [facility]  Re[facility] Services Physical Therapist Office: [phone] Website: [email or web address]   Leontine GORMAN Roads 05/23/2024, 10:49 AM

## 2024-05-23 NOTE — Assessment & Plan Note (Signed)
 Chol 273, HDL 33, LDL 202, Trig 190 (switching Rosuvastatin  5mg  > Atorvastatin  40mg  + Ezetimibe  10mg  due to possibly causing AS back pain and trial for high intensity statin for recent stroke. If unable to tolerate will consider other options)

## 2024-05-23 NOTE — Assessment & Plan Note (Signed)
 Type 2 diabetes mellitus: Diet controlled  - Follow CBGs  - A1C pending Hypertension:  - Hold home carvedilol  in setting of permissive HTN  Ankylosing Spondylitis:  - Discontinue home tramadol  and tizanidine  due to kidney function - Oxycodone  2.5 q6 PRN Bipolar 1:  - Not currently on any medication, previously on lithium  d/c 4/24

## 2024-05-23 NOTE — Assessment & Plan Note (Signed)
 History of worsening renal function w/ nephrotic studies in past year with increasing creatinine increasing in past year from 1.15>3.98 Currently 4.76>4.71. Follows up with Dr. Melia with [facility] with plan for renal bx later this month. CT AP: Mild nonspecific perinephric soft tissue stranding bilaterally which could be secondary to underlying renal inflammation.  - Nephrology following: will coordinate with IR and neurology to determine if inpatient bx is possible considering patient is on DAPT as recommended by neurology for stroke - Hold IVF, will encourage PO, appears euvolemic on exam - Renal function panel daily - Renal diet

## 2024-05-23 NOTE — Evaluation (Signed)
 Occupational Therapy Evaluation Patient Details Name: Edward [name] MRN: [id number] DOB: 06/11/1971 Today's Date: 05/23/2024   History of Present Illness   Pt is a 53 y.o. male presenting 8/10 with confusion for several days, L flank pain. MRI brain with 8 mm acute ischemic nonhemorrhagic infarct involving the ventral  left thalamus, age advanced chronic microvascular ischemic disease  with a few scattered remote lacunar infarcts about the left basal ganglia and left thalamus.  Also found to have worsening renal function. PMH: DMII, HTN, HLD, ankylosing spondylitis, bipolar 1, CKD IIIb.     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 Upon eval, pt grossly supervision-mod I for BADL. Pt with mild RUE decr coordination, decr cognitive processing speed as it pertains to problem solving and memory, as well as decreased dual tasking ability. Pt to benefit from OP OT follow up after discharge to facilitate return to IADL and work. Pt would additionally benefit from SLP consult for executive function and questionable word finding difficulty.      If plan is discharge home, recommend the following:   Other (comment) (on pt request)     Functional Status Assessment   Patient has had a recent decline in their functional status and demonstrates the ability to make significant improvements in function in a reasonable and predictable amount of time.     Equipment Recommendations   None recommended by OT     Recommendations for Other Services   Speech consult     Precautions/Restrictions   Restrictions Weight Bearing Restrictions Per Provider Order: No     Mobility Bed Mobility Overal bed mobility: Independent                  Transfers Overall transfer level: Needs assistance Equipment used: None Transfers: Sit to/from Stand Sit to Stand: Supervision            General transfer comment: supervision for safety, no physical assist required. able to stablize without UE support.      Balance Overall balance assessment: Modified Independent                                         ADL either performed or assessed with clinical judgement   ADL Overall ADL's : Needs assistance/impaired Eating/Feeding: Independent   Grooming: Supervision/safety;Standing   Upper Body Bathing: Independent   Lower Body Bathing: Supervison/ safety   Upper Body Dressing : Independent   Lower Body Dressing: Supervision/safety   Toilet Transfer: Supervision/safety           Functional mobility during ADLs: Supervision/safety       Vision Baseline Vision/History: 1 Wears glasses Ability to See in Adequate Light: 0 Adequate Patient Visual Report: No change from baseline Vision Assessment?: No apparent visual deficits     Perception Perception: Within Functional Limits       Praxis Praxis: WFL       Pertinent Vitals/Pain Pain Assessment Pain Assessment: Faces Faces Pain Scale: Hurts little more Pain Location: back Pain Descriptors / Indicators: Aching Pain Intervention(s): Limited activity within patient's tolerance     Extremity/Trunk Assessment Upper Extremity Assessment Upper Extremity Assessment: Right hand dominant;RUE deficits/detail RUE Deficits / Details: mild decr coordination and gross strength more notable during finger to nose than dysdiadokinesia testing   Lower Extremity Assessment Lower  Extremity Assessment: Defer to PT evaluation       Communication Communication Communication: Impaired Factors Affecting Communication: Reduced clarity of speech   Cognition Arousal: Alert Behavior During Therapy: WFL for tasks assessed/performed Cognition: Cognition impaired     Awareness: Intellectual awareness intact, Online awareness intact Memory impairment (select all impairments): Short-term memory Attention  impairment (select first level of impairment): Alternating attention, Divided attention Executive functioning impairment (select all impairments): Organization, Problem solving, Sequencing OT - Cognition Comments: pt passing short blessed test with score of 0. pt able to follow 3 step sequence command with increased time (draw a line to letter, number, shape, letter number, shape in ascending order), min increased time for dual tasking with naming task while walking in hall and counting backward by 7s.                 Following commands: Intact       Cueing  General Comments   Cueing Techniques: Verbal cues  HR up to 120 with mobility into hall   Exercises     Shoulder Instructions      Home Living Family/patient expects to be discharged to:: Private residence Living Arrangements: Spouse/significant other Available Help at Discharge: Family;Friend(s);Available 24 hours/day (states fiance works, but can arrange 24/7 if needed) Type of Home: House Home Access: Stairs to enter [employer] of Steps: 1 Entrance Stairs-Rails: None Home Layout: One level     Bathroom Shower/Tub: Producer, television/film/video: Handicapped height     Home Equipment: Cane - quad          Prior Functioning/Environment Prior Level of Function : Independent/Modified Independent;Driving             Mobility Comments: ind, occasional use of quad cane if AS flares up. 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    OT Problem List: Decreased strength;Decreased activity tolerance;Impaired balance (sitting and/or standing);Decreased cognition;Impaired UE functional use   OT Treatment/Interventions: Self-care/ADL training;Therapeutic exercise;DME and/or AE instruction;Therapeutic activities;Patient/family education;Balance training;Cognitive remediation/compensation      OT Goals(Current goals can be found in the care plan section)   Acute  Rehab OT Goals Patient Stated Goal: get better OT Goal Formulation: With patient/family Time For Goal Achievement: 06/06/24 Potential to Achieve Goals: Good   OT Frequency:  Min 2X/week    Co-evaluation              AM-PAC OT 6 Clicks Daily Activity     Outcome Measure Help from another person eating meals?: None Help from another person taking care of personal grooming?: A Little Help from another person toileting, which includes using toliet, bedpan, or urinal?: A Little Help from another person bathing (including washing, rinsing, drying)?: A Little Help from another person to put on and taking off regular upper body clothing?: None Help from another person to put on and taking off regular lower body clothing?: A Little 6 Click Score: 20   End of Session Nurse Communication: Mobility status  Activity Tolerance: Patient tolerated treatment well Patient left: in bed;with call bell/phone within reach;Other (comment) (neuro MD in room)  OT Visit Diagnosis: Unsteadiness on feet (R26.81);Muscle weakness (generalized) (M62.81);Other symptoms and signs involving cognitive function                Time: 8543-8470 OT Time Calculation (min): 33 min Charges:  OT General Charges $OT Visit: 1 Visit OT Evaluation $OT Eval Low Complexity: 1 Low OT Treatments $Cognitive Funtion inital: Initial  15 mins  Elma JONETTA Penner, OTD, OTR/L [facility] Acute Rehabilitation Office: [phone]   Elma JONETTA Penner 05/23/2024, 3:51 PM

## 2024-05-24 ENCOUNTER — Encounter: Payer: Self-pay | Admitting: Family Medicine

## 2024-05-24 DIAGNOSIS — I6389 Other cerebral infarction: Secondary | ICD-10-CM | POA: Diagnosis not present

## 2024-05-24 DIAGNOSIS — R29701 NIHSS score 1: Secondary | ICD-10-CM

## 2024-05-24 DIAGNOSIS — I129 Hypertensive chronic kidney disease with stage 1 through stage 4 chronic kidney disease, or unspecified chronic kidney disease: Secondary | ICD-10-CM | POA: Diagnosis not present

## 2024-05-24 DIAGNOSIS — N1832 Chronic kidney disease, stage 3b: Secondary | ICD-10-CM | POA: Diagnosis not present

## 2024-05-24 DIAGNOSIS — I639 Cerebral infarction, unspecified: Secondary | ICD-10-CM | POA: Diagnosis not present

## 2024-05-24 LAB — GLUCOSE, CAPILLARY
Glucose-Capillary: 117 mg/dL — ABNORMAL HIGH (ref 70–99)
Glucose-Capillary: 178 mg/dL — ABNORMAL HIGH (ref 70–99)
Glucose-Capillary: 244 mg/dL — ABNORMAL HIGH (ref 70–99)
Glucose-Capillary: 145 mg/dL — ABNORMAL HIGH (ref 70–99)

## 2024-05-24 LAB — RENAL FUNCTION PANEL
Albumin: 2.5 g/dL — ABNORMAL LOW (ref 3.5–5.0)
Anion gap: 11 (ref 5–15)
BUN: 61 mg/dL — ABNORMAL HIGH (ref 6–20)
CO2: 24 mmol/L (ref 22–32)
Calcium: 8.7 mg/dL — ABNORMAL LOW (ref 8.9–10.3)
Chloride: 106 mmol/L (ref 98–111)
Creatinine, Ser: 4.75 mg/dL — ABNORMAL HIGH (ref 0.61–1.24)
GFR, Estimated: 14 mL/min — ABNORMAL LOW (ref >60)
Glucose, Bld: 113 mg/dL — ABNORMAL HIGH (ref 70–99)
Phosphorus: 5.9 mg/dL — ABNORMAL HIGH (ref 2.5–4.6)
Potassium: 4.8 mmol/L (ref 3.5–5.1)
Sodium: 141 mmol/L (ref 135–145)

## 2024-05-24 MED ORDER — LIDOCAINE 5 % EX PTCH
1.0000 | MEDICATED_PATCH | CUTANEOUS | Status: DC
  Administered 2024-05-24 (×2): 1 via TRANSDERMAL
  Filled 2024-05-24: qty 1

## 2024-05-24 MED ORDER — AMLODIPINE BESYLATE 5 MG PO TABS
10.0000 mg | ORAL_TABLET | Freq: Every day | ORAL | Status: DC
  Administered 2024-05-24 – 2024-05-25 (×4): 10 mg via ORAL
  Filled 2024-05-24 (×2): qty 2

## 2024-05-24 MED ORDER — CARVEDILOL 3.125 MG PO TABS
3.1250 mg | ORAL_TABLET | Freq: Two times a day (BID) | ORAL | Status: DC
  Administered 2024-05-24 – 2024-05-25 (×6): 3.125 mg via ORAL
  Filled 2024-05-24 (×3): qty 1

## 2024-05-24 NOTE — Assessment & Plan Note (Addendum)
 History of worsening renal function w/ nephrotic studies in past year with increasing creatinine. Stable Cr in the 4.7s. Euvolemic. - Nephrology:  - IR not willing to do inpatient biopsy with aspirin  - Outpatient biopsy in 4 weeks with Dr. Melia - Dr. Melia will follow up with him outpatient for biopsy and protein lowering therapy - Hold IVF, continue PO - Renal function panel daily

## 2024-05-24 NOTE — Progress Notes (Signed)
 Spoke with Edward [name]. On Dr. Bucky Research Team. She asked that I pass along to night shift her number [phone], and that he wear the CPAP at least 4.5 hours. Will relay to night shift

## 2024-05-24 NOTE — Progress Notes (Addendum)
 STROKE TEAM PROGRESS NOTE   SIGNIFICANT HOSPITAL EVENTS 8/10: Patient presented with a 6-day history of confusion, accompanied by hesitancy and word-finding difficulty.  CT revealed a left ventral thalamic infarct.   SUBJECTIVE (INTERIM HISTORY) Patient is sitting upright in chair.  Patient is alert and in no acute distress.  Today patient continues to report numbness involving left face, arm, and leg.  Patient tested positive for obstructive sleep apnea on the overnight NOx 3 monitor and will have CPAP mask tolerability test tonight.  No new complaints   OBJECTIVE  CBC    Component Value Date/Time   WBC 8.3 05/22/2024 0837   RBC 4.64 05/22/2024 0837   HGB 13.1 05/22/2024 0837   HGB 11.3 (L) 03/21/2024 1037   HCT 41.4 05/22/2024 0837   HCT 36.2 (L) 03/21/2024 1037   PLT 360 05/22/2024 0837   PLT 338 03/21/2024 1037   MCV 89.2 05/22/2024 0837   MCV 93 03/21/2024 1037   MCH 28.2 05/22/2024 0837   MCHC 31.6 05/22/2024 0837   RDW 15.3 05/22/2024 0837   RDW 14.2 03/21/2024 1037   LYMPHSABS 1.9 05/22/2024 0837   MONOABS 0.7 05/22/2024 0837   EOSABS 0.4 05/22/2024 0837   BASOSABS 0.1 05/22/2024 0837    BMET    Component Value Date/Time   NA 141 05/24/2024 0425   NA 141 05/06/2024 1552   K 4.8 05/24/2024 0425   CL 106 05/24/2024 0425   CO2 24 05/24/2024 0425   GLUCOSE 113 (H) 05/24/2024 0425   BUN 61 (H) 05/24/2024 0425   BUN 52 (H) 05/06/2024 1552   CREATININE 4.75 (H) 05/24/2024 0425   CREATININE 1.47 (H) 11/07/2021 0903   CALCIUM  8.7 (L) 05/24/2024 0425   EGFR 18 (L) 05/06/2024 1552   GFRNONAA 14 (L) 05/24/2024 0425   GFRNONAA >89 05/02/2016 0925    IMAGING past 24 hours No results found.  Vitals:   05/23/24 2047 05/24/24 0412 05/24/24 0829 05/24/24 1132  BP: (!) 160/92 (!) 160/97 (!) 177/105 (!) 147/99  Pulse: 85 84 81 77  Resp: 18 18 18 18   Temp: 98.7 F (37.1 C) (!) 97.4 F (36.3 C) 98.6 F (37 C) (!) 97.4 F (36.3 C)  TempSrc: Oral Oral Oral Oral   SpO2: 97% 99% 100% 100%  Weight:      Height:         PHYSICAL EXAM General:  Alert, well-nourished, well-developed patient in no acute distress CV: Regular rate and rhythm on monitor Respiratory:  Regular, unlabored respirations on room air     NEURO:  Mental Status: AA&Ox3, patient is able to give clear and coherent history, memory has improved since admission Speech/Language: speech is without dysarthria or aphasia.  Naming, repetition, fluency, and comprehension intact. No hesitancy or difficulty word finding.    Cranial Nerves:  II: PERRL. Visual fields full.  III, IV, VI: EOMI. Eyelids elevate symmetrically.  V: Decreased sensation to light tough in all three division on the left VII: Face is symmetrical resting and smiling VIII: hearing intact to voice. IX, X: Palate elevates symmetrically. Phonation is normal.  KP:Dynloizm shrug 5/5. XII: tongue is midline without fasciculations. Motor: 5/5 strength to all muscle groups tested.  Tone: is normal and bulk is normal Sensation-Decreased light touch sensation involving L face, LUE, and LLE  Coordination: FTN intact bilaterally, HKS: no ataxia in BLE.No drift.  Gait- deferred   NIHSS 1   ASSESSMENT/PLAN [name] is a 53 y.o. male with history of CKD, HTN,  DMII, Bipolar 1, and ankylosing spondylitis, admitted for acute confusion that began Tuesday and markedly worsened Friday. NIH on Admission 5.  Memory function has markedly improved since admission; however, decree sensation persists on the left side of the face, arm, and leg.  After consultation with nephrology, the plan is to continue dual antiplatelet therapy for 3 weeks, then discontinue both aspirin  and Plavix  prior to her renal biopsy scheduled in 4 weeks.  Aspirin  alone will be resumed after the biopsy.  Discharge is planned for tomorrow.   Acute Ischemic Infarct:  ventral left thalamus Etiology:  Hypertension, Hyperlipidemia, Smoking history Code Stroke CT  head No acute abnormality. 2 separate remote lacunar infarcts within L thalamus. Subtle white matter hypoattenuation mildly advanced for age MRI  8 mm acute ischemic nonhemorrhagic infarct involving ventral left thalamus. Underlying age advanced chronic microvascular ischemic disease with scattered remote lacunar infarcts about L basal ganglia and L thalamus MRA  Negative for LVO, moderate long segment narrowing/stenosis of the right P2 segment. Focal mild-to-moderate stenosis involving the proximal dominant L V4 segment MRA neck Normal  2D Echo ejection fraction 60 to 65%.  No wall motion abnormalities.  Left atrial size normal. LDL 202 HgbA1c 7.4 VTE prophylaxis -Heparin  No antithrombotic prior to admission, now on aspirin  81 mg daily and clopidogrel  75 mg daily for 3 weeks and then aspirin  alone. Therapy recommendations:  Pending Disposition: 8/13     CKD, III 3b Renal Biopsy, scheduled for 8/28 outpatient.  Patient will discontinue DAPT prior to biopsy and continue aspirin  alone after biopsy Baseline Cr 2-2.5 Cr 4.71, GFR 14   Hypertension Home meds: Amlodipine  10 mg, Carvedilol  3.125, restart home medications Stable Blood Pressure Goal: SBP less than 160    Hyperlipidemia Home meds:  Rosuvastatin , not resumed  Add Atrovastatin 40 mg, Zetia  10 mg LDL 202, goal < 70 Continue statin at discharge   Diabetes type II Uncontrolled Diet controlled, nonadherent to metformin   HbA1c 7.4, previous HbA1c 8.4 (01/2023) HgbA1c goal < 7.0 CBGs SSI Recommend close follow-up with PCP for better DM control   Tobacco Abuse Former smoker, quit 1 month ago Previous 2.5 pack-year for 10 years, tobacco     Dysphagia Patient has post-stroke dysphagia, SLP consulted       Diet    Diet renal with fluid restriction Fluid restriction: 1200 mL Fluid; Room service appropriate? Yes; Fluid consistency: Thin        Advance diet as tolerated   Other Stroke Risk Factors Obesity, Body mass index  is 42.22 kg/m., BMI >/= 30 associated with increased stroke risk, recommend weight loss, diet and exercise as appropriate  Family hx stroke (Maternal grandmother) Coronary artery disease     Other Active Problems Ankylosis spondylitis Bipolar 1 New diagnosis of obstructive sleep apnea  Hospital day #2  Alan Maiden, MD PGY-1  I have personally obtained history,examined this patient, reviewed notes, independently viewed imaging studies, participated in medical decision making and plan of care.ROS completed by me personally and pertinent positives fully documented  I have made any additions or clarifications directly to the above note. Agree with note above.  Patient continues to have mild left-sided dysesthesias.  He tested positive for obstructive sleep apnea on the overnight NOx 3 monitor and will have CPAP mask tolerability test tonight.  Long discussion with patient and answered questions.  Discussed with Dr. Donah   I personally spent a total of  35 minutes in the care of the patient today including getting/reviewing separately obtained  history, performing a medically appropriate exam/evaluation, counseling and educating, placing orders, referring and communicating with other health care professionals, documenting clinical information in the EHR, independently interpreting results, and coordinating care.         Eather Popp, MD Medical Director [facility] Pager: [phone] 05/24/2024 2:33 PM  To contact Stroke Continuity provider, please refer to [email or web address]. After hours, contact General Neurology

## 2024-05-24 NOTE — Assessment & Plan Note (Addendum)
 Clinically improving with better mentation. Neuro deficits appear to be memory and mood-regulation, with mild left sided sensory reduction. - Neurology:  - DAPT 3 weeks, stop Plavix , then stop Aspirin  3 days before biopsy in 4 weeks. Resume Aspirin  only after biopsy - Will need to stay tonight for CPAP tolerability testing  - Echo is unremarkable - PT: Acute skilled PT before DC, outpatient neuro rehab - OT: OT f/u after DC - SLP pending - [employer], Vital signs per floor - Fall precautions - Delirium precautions

## 2024-05-24 NOTE — Progress Notes (Signed)
 [name] Progress Note   Subjective:   This morning he is out of bed. Reports doing well. He slept well last night. Thinks his LE edema is improved. Has some pain from his spondylitis which is typical. Thinks his memory/recall is improving but is trying not to fixate on it. He has no acute complaints.   Objective Vitals:   05/23/24 1513 05/23/24 2047 05/24/24 0412 05/24/24 0829  BP: (!) 154/106 (!) 160/92 (!) 160/97 (!) 177/105  Pulse: 87 85 84 81  Resp: 20 18 18 18   Temp: 97.7 F (36.5 C) 98.7 F (37.1 C) (!) 97.4 F (36.3 C) 98.6 F (37 C)  TempSrc: Oral Oral Oral Oral  SpO2: 98% 97% 99% 100%  Weight:      Height:       Physical Exam General: WDWN man in NAD Heart:RRR no MRG Lungs: CTABL on room air Abdomen: Nontender, nondistended Extremities: No edema in the arms or legs Dialysis Access: N/A  Additional Objective Labs: Basic Metabolic Panel: Recent Labs  Lab 05/22/24 0837 05/23/24 0130 05/24/24 0425  NA 138 137 141  K 4.8 4.3 4.8  CL 104 105 106  CO2 22 21* 24  GLUCOSE 139* 136* 113*  BUN 61* 60* 61*  CREATININE 4.76* 4.71* 4.75*  CALCIUM  8.3* 8.3* 8.7*  PHOS  --  6.2* 5.9*   Liver Function Tests: Recent Labs  Lab 05/22/24 0837 05/23/24 0130 05/24/24 0425  AST 19  --   --   ALT 9  --   --   ALKPHOS 86  --   --   BILITOT 0.5  --   --   PROT 6.4*  --   --   ALBUMIN 2.7* 2.4* 2.5*   No results for input(s): LIPASE, AMYLASE in the last 168 hours. CBC: Recent Labs  Lab 05/22/24 0837  WBC 8.3  NEUTROABS 5.2  HGB 13.1  HCT 41.4  MCV 89.2  PLT 360   Blood Culture    Component Value Date/Time   SDES URINE, CLEAN CATCH 08/07/2015 0307   SPECREQUEST NONE 08/07/2015 0307   CULT  08/07/2015 0307    NO GROWTH 1 DAY Performed at [facility]    REPTSTATUS 08/08/2015 FINAL 08/07/2015 0307    Cardiac Enzymes: No results for input(s): CKTOTAL, CKMB, CKMBINDEX, TROPONINI in the last 168 hours. CBG: Recent  Labs  Lab 05/22/24 0844 05/23/24 0136 05/23/24 1631 05/23/24 2111 05/24/24 0612  GLUCAP 134* 129* 166* 239* 117*   Iron Studies: No results for input(s): IRON, TIBC, TRANSFERRIN, FERRITIN in the last 72 hours. @lablastinr3 @ Studies/Results: ECHOCARDIOGRAM COMPLETE Result Date: 05/23/2024    ECHOCARDIOGRAM REPORT   Patient Name:   [name] Date of Exam: 05/23/2024 Medical Rec #:  [id number]     Height:       71.0 in Accession #:    [id number]    Weight:       302.7 lb Date of Birth:  [birth year]     BSA:          2.515 m Patient Age:    53 years      BP:           152/96 mmHg Patient Gender: M             HR:           85 bpm. Exam Location:  Inpatient Procedure: 2D Echo, Color Doppler, Cardiac Doppler and Saline Contrast Bubble  Study (Both Spectral and Color Flow Doppler were utilized during            procedure). Indications:    Stroke I63.9  History:        Patient has no prior history of Echocardiogram examinations.  Sonographer:    Tinnie Gosling RDCS Referring Phys: DAMIEN MILLER IMPRESSIONS  1. Left ventricular ejection fraction, by estimation, is 60 to 65%. The left ventricle has normal function. The left ventricle has no regional wall motion abnormalities. Left ventricular diastolic parameters were normal.  2. Right ventricular systolic function is normal. The right ventricular size is normal.  3. The mitral valve is normal in structure. No evidence of mitral valve regurgitation. No evidence of mitral stenosis.  4. The aortic valve is tricuspid. Aortic valve regurgitation is not visualized. No aortic stenosis is present.  5. The inferior vena cava is normal in size with greater than 50% respiratory variability, suggesting right atrial pressure of 3 mmHg.  6. Agitated saline contrast bubble study was negative, with no evidence of any interatrial shunt. Comparison(s): No prior Echocardiogram. Conclusion(s)/Recommendation(s): No intracardiac source of embolism detected on this  transthoracic study. Consider a transesophageal echocardiogram to exclude cardiac source of embolism if clinically indicated. FINDINGS  Left Ventricle: Left ventricular ejection fraction, by estimation, is 60 to 65%. The left ventricle has normal function. The left ventricle has no regional wall motion abnormalities. The left ventricular internal cavity size was normal in size. There is  borderline left ventricular hypertrophy. Left ventricular diastolic parameters were normal. Right Ventricle: The right ventricular size is normal. No increase in right ventricular wall thickness. Right ventricular systolic function is normal. Left Atrium: Left atrial size was normal in size. Right Atrium: Right atrial size was normal in size. Pericardium: There is no evidence of pericardial effusion. Mitral Valve: The mitral valve is normal in structure. There is mild thickening of the mitral valve leaflet(s). Normal mobility of the mitral valve leaflets. Mild mitral annular calcification. No evidence of mitral valve regurgitation. No evidence of mitral valve stenosis. Tricuspid Valve: The tricuspid valve is normal in structure. Tricuspid valve regurgitation is not demonstrated. No evidence of tricuspid stenosis. Aortic Valve: The aortic valve is tricuspid. Aortic valve regurgitation is not visualized. No aortic stenosis is present. Aortic valve mean gradient measures 2.0 mmHg. Aortic valve peak gradient measures 3.4 mmHg. Pulmonic Valve: The pulmonic valve was normal in structure. Pulmonic valve regurgitation is not visualized. No evidence of pulmonic stenosis. Aorta: The aortic root and ascending aorta are structurally normal, with no evidence of dilitation. Venous: The inferior vena cava is normal in size with greater than 50% respiratory variability, suggesting right atrial pressure of 3 mmHg. IAS/Shunts: The atrial septum is grossly normal. Agitated saline contrast was given intravenously to evaluate for intracardiac shunting.  Agitated saline contrast bubble study was negative, with no evidence of any interatrial shunt.  LEFT VENTRICLE PLAX 2D LVIDd:         4.20 cm   Diastology LVIDs:         2.40 cm   LV e' medial:    6.53 cm/s LV PW:         1.20 cm   LV E/e' medial:  9.4 LV IVS:        1.30 cm   LV e' lateral:   9.79 cm/s LVOT diam:     2.00 cm   LV E/e' lateral: 6.3 LVOT Area:     3.14 cm  RIGHT VENTRICLE  IVC RV S prime:     14.40 cm/s  IVC diam: 1.40 cm TAPSE (M-mode): 2.1 cm LEFT ATRIUM             Index        RIGHT ATRIUM           Index LA diam:        3.80 cm 1.51 cm/m   RA Area:     14.50 cm LA Vol (A2C):   45.1 ml 17.94 ml/m  RA Volume:   30.50 ml  12.13 ml/m LA Vol (A4C):   45.7 ml 18.17 ml/m LA Biplane Vol: 46.3 ml 18.41 ml/m  AORTIC VALVE AV Vmax:      92.60 cm/s AV Vmean:     65.200 cm/s AV VTI:       0.174 m AV Peak Grad: 3.4 mmHg AV Mean Grad: 2.0 mmHg  AORTA Ao Root diam: 2.90 cm Ao Asc diam:  3.70 cm MITRAL VALVE MV Area (PHT): 6.12 cm    SHUNTS MV Decel Time: 124 msec    Systemic Diam: 2.00 cm MV E velocity: 61.30 cm/s MV A velocity: 99.00 cm/s MV E/A ratio:  0.62 Sunit Tolia Electronically signed by Madonna Large Signature Date/Time: 05/23/2024/1:43:33 PM    Final    MR ANGIO HEAD WO CONTRAST Result Date: 05/23/2024 CLINICAL DATA:  Follow-up examination for stroke. EXAM: MRA NECK WITHOUT CONTRAST MRA HEAD WITHOUT CONTRAST TECHNIQUE: Angiographic images of the Circle of Willis were acquired using MRA technique without intravenous contrast. COMPARISON:  None Available. FINDINGS: MRA NECK FINDINGS Aortic arch: Visualized aortic arch within normal limits for caliber with standard branch pattern. No visible stenosis or other abnormality about the origin the great vessels. Right carotid system: Right common and internal carotid arteries are patent with antegrade flow. No evidence for dissection. No hemodynamically significant stenosis about the right carotid artery system. Left carotid system: Left  common and internal carotid arteries are patent with antegrade flow. No evidence for dissection. No hemodynamically significant stenosis about the left carotid artery system. Vertebral arteries: Both vertebral arteries arise from subclavian arteries. Left vertebral artery dominant. Vertebral arteries are patent with antegrade flow. No evidence for dissection or stenosis. Other: None. MRA HEAD FINDINGS Anterior circulation: Both internal carotid arteries are widely patent through the siphons without stenosis or other abnormality. A1 segments patent bilaterally. Normal anterior communicating artery complex. Anterior cerebral arteries patent without significant stenosis. No M1 stenosis or occlusion. No proximal MCA branch occlusion. Distal MCA branches perfused and symmetric. Posterior circulation: Focal mild-to-moderate stenosis seen involving the proximal dominant left V4 segment (series 1030, image 1). Mildly heterogeneous signal intensity within the left vertebral artery at this level suspected to be related to turbulent flow. Left PICA patent. Diminutive right vertebral artery largely terminates in PICA, although a tiny branch is seen ascending towards the vertebrobasilar junction. Right PICA patent as well. Basilar patent without stenosis. Superior cerebral arteries patent bilaterally. Both PCAs primarily supplied via the basilar. Left PCA patent to its distal aspect without stenosis. Moderate long segment narrowing/stenosis of the right P2 segment (series 1024, image 3). Right PCA remains patent to its distal aspect. Anatomic variants: As above. Other: No intracranial aneurysm. IMPRESSION: MRA HEAD: 1. Negative intracranial MRA for large vessel occlusion. 2. Moderate long segment narrowing/stenosis of the right P2 segment. 3. Focal mild-to-moderate stenosis involving the proximal dominant left V4 segment. MRA NECK: Normal MRA of the neck. Electronically Signed   By: Morene Hoard M.D.   On:  05/23/2024  02:05   MR ANGIO NECK WO CONTRAST Result Date: 05/23/2024 CLINICAL DATA:  Follow-up examination for stroke. EXAM: MRA NECK WITHOUT CONTRAST MRA HEAD WITHOUT CONTRAST TECHNIQUE: Angiographic images of the Circle of Willis were acquired using MRA technique without intravenous contrast. COMPARISON:  None Available. FINDINGS: MRA NECK FINDINGS Aortic arch: Visualized aortic arch within normal limits for caliber with standard branch pattern. No visible stenosis or other abnormality about the origin the great vessels. Right carotid system: Right common and internal carotid arteries are patent with antegrade flow. No evidence for dissection. No hemodynamically significant stenosis about the right carotid artery system. Left carotid system: Left common and internal carotid arteries are patent with antegrade flow. No evidence for dissection. No hemodynamically significant stenosis about the left carotid artery system. Vertebral arteries: Both vertebral arteries arise from subclavian arteries. Left vertebral artery dominant. Vertebral arteries are patent with antegrade flow. No evidence for dissection or stenosis. Other: None. MRA HEAD FINDINGS Anterior circulation: Both internal carotid arteries are widely patent through the siphons without stenosis or other abnormality. A1 segments patent bilaterally. Normal anterior communicating artery complex. Anterior cerebral arteries patent without significant stenosis. No M1 stenosis or occlusion. No proximal MCA branch occlusion. Distal MCA branches perfused and symmetric. Posterior circulation: Focal mild-to-moderate stenosis seen involving the proximal dominant left V4 segment (series 1030, image 1). Mildly heterogeneous signal intensity within the left vertebral artery at this level suspected to be related to turbulent flow. Left PICA patent. Diminutive right vertebral artery largely terminates in PICA, although a tiny branch is seen ascending towards the vertebrobasilar  junction. Right PICA patent as well. Basilar patent without stenosis. Superior cerebral arteries patent bilaterally. Both PCAs primarily supplied via the basilar. Left PCA patent to its distal aspect without stenosis. Moderate long segment narrowing/stenosis of the right P2 segment (series 1024, image 3). Right PCA remains patent to its distal aspect. Anatomic variants: As above. Other: No intracranial aneurysm. IMPRESSION: MRA HEAD: 1. Negative intracranial MRA for large vessel occlusion. 2. Moderate long segment narrowing/stenosis of the right P2 segment. 3. Focal mild-to-moderate stenosis involving the proximal dominant left V4 segment. MRA NECK: Normal MRA of the neck. Electronically Signed   By: Morene Hoard M.D.   On: 05/23/2024 02:05   MR BRAIN WO CONTRAST Result Date: 05/22/2024 CLINICAL DATA:  Follow-up examination for stroke. EXAM: MRI HEAD WITHOUT CONTRAST TECHNIQUE: Multiplanar, multiecho pulse sequences of the brain and surrounding structures were obtained without intravenous contrast. COMPARISON:  CT from earlier the same day FINDINGS: Brain: Cerebral volume within normal limits. Patchy T2/FLAIR hyperintensity involving the periventricular and deep white matter both cerebral hemispheres as well as the pons, most like related chronic microvascular ischemic disease, moderate in nature and advanced for age. Few scattered superimposed remote lacunar infarcts present about the left basal ganglia and left thalamus. 8 mm acute ischemic nonhemorrhagic ends are seen involving the ventral left thalamus (series 5, image 81). No significant mass effect. No other evidence for acute or subacute ischemia. Gray-white matter differentiation maintained. No acute intracranial hemorrhage. Focus of chronic hemosiderin staining present at the para falcine frontal parietal region (series 12, image 53), consistent with a small focus of chronic hemorrhage. No mass lesion, midline shift or mass effect. No  hydrocephalus or extra-axial fluid collection. Pituitary gland within normal limits. Vascular: Right vertebral artery hypoplastic and not well seen. Major intracranial vascular flow voids are otherwise maintained. Skull and upper cervical spine: Cranial junction within normal limits. Bone marrow signal intensity normal. No  scalp soft tissue abnormality. Sinuses/Orbits: Globes and orbital soft tissues within normal limits. Paranasal sinuses and mastoid air cells are largely clear. Other: None. IMPRESSION: 1. 8 mm acute ischemic nonhemorrhagic infarct involving the ventral left thalamus. 2. Underlying age advanced chronic microvascular ischemic disease with a few scattered remote lacunar infarcts about the left basal ganglia and left thalamus. Electronically Signed   By: Morene Hoard M.D.   On: 05/22/2024 17:48   CT ABDOMEN PELVIS WO CONTRAST Result Date: 05/22/2024 CLINICAL DATA:  Acute renal failure. EXAM: CT ABDOMEN AND PELVIS WITHOUT CONTRAST TECHNIQUE: Multidetector CT imaging of the abdomen and pelvis was performed following the standard protocol without IV contrast. RADIATION DOSE REDUCTION: This exam was performed according to the departmental dose-optimization program which includes automated exposure control, adjustment of the mA and/or kV according to patient size and/or use of iterative reconstruction technique. COMPARISON:  Renal ultrasound 05/09/2024. Report only from remote abdominopelvic CT 12/12/2014. Pelvic and right hip radiographs 05/15/2021. FINDINGS: Lower chest: Clear lung bases. No significant pleural or pericardial effusion. Hepatobiliary: The liver appears unremarkable as imaged in the noncontrast state. No morphologic changes of cirrhosis or focal abnormalities are identified. There is 1.9 cm peripherally calcified gallstone. No evidence of gallbladder wall thickening, surrounding inflammation or biliary ductal dilatation. Pancreas: Unremarkable. No pancreatic ductal dilatation or  surrounding inflammatory changes. Spleen: Normal in size without focal abnormality. Adrenals/Urinary Tract: Both adrenal glands appear normal. No evidence of urinary tract calculus, hydronephrosis or focal renal abnormality on noncontrast imaging. There is mild symmetric perinephric soft tissue stranding bilaterally. The bladder appears unremarkable for its degree of distention. Stomach/Bowel: No enteric contrast administered. There are postsurgical changes consistent with Roux-en-Y gastric bypass. No bowel distension, wall thickening or surrounding inflammation. The appendix appears normal. Mildly prominent stool throughout the colon. Vascular/Lymphatic: There are no enlarged abdominal or pelvic lymph nodes. Small retroperitoneal lymph nodes are not pathologically enlarged. Mild aortoiliac atherosclerosis. Reproductive: The prostate gland and seminal vesicles appear unremarkable. Other: 1.4 cm peripherally calcified nodule in the superior omentum (image 32/3), likely a focus of remote fat necrosis or inflammation. Mildly prominent fat in both inguinal canals. No ascites or pneumoperitoneum. Musculoskeletal: No acute or significant osseous findings. Multilevel spondylosis with chronic appearing osseous foraminal narrowing bilaterally at L5-S1. Probable postsurgical or posttraumatic findings in the lateral aspect of the left iliac crest, unchanged from prior radiographs. The sacroiliac joints appear partially ankylosed bilaterally. IMPRESSION: 1. No acute findings or explanation for the patient's symptoms. 2. No evidence of urinary tract calculus or hydronephrosis. Mild nonspecific perinephric soft tissue stranding bilaterally which could be secondary to underlying renal inflammation. Correlate clinically. 3. Cholelithiasis without evidence of cholecystitis or biliary ductal dilatation. 4. 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 5.  Aortic Atherosclerosis (ICD10-I70.0). Electronically Signed    By: Elsie Perone M.D.   On: 05/22/2024 12:16   CT Head Wo Contrast Result Date: 05/22/2024 EXAM: CT HEAD WITHOUT CONTRAST 05/22/2024 11:53:52 AM TECHNIQUE: CT of the head was performed without the administration of intravenous contrast. Automated exposure control, iterative reconstruction, and/or weight based adjustment of the mA/kV was utilized to reduce the radiation dose to as low as reasonably achievable. COMPARISON: None available. CLINICAL HISTORY: Mental status change, unknown cause. FINDINGS: BRAIN AND VENTRICLES: No acute hemorrhage. Gray-white differentiation is preserved. No hydrocephalus. No extra-axial collection. No mass effect or midline shift. 2 separate remote lacunar infarcts are present within the left thalamus. Subtle white matter hypoattenuation is mildly advanced for age. ORBITS: No acute abnormality. SINUSES:  No acute abnormality. SOFT TISSUES AND SKULL: No acute soft tissue abnormality. No skull fracture. IMPRESSION: 1. No acute intracranial abnormality. 2. 2 separate remote lacunar infarcts within the left thalamus. 3. Subtle white matter hypoattenuation is mildly advanced for age. This may reflect underlying microvascular ischemia. Electronically signed by: Lonni Necessary MD 05/22/2024 12:11 PM EDT RP Workstation: [id number]   Medications:   amLODipine   10 mg Oral Daily   aspirin  EC  81 mg Oral Daily   atorvastatin   40 mg Oral Daily   carvedilol   3.125 mg Oral BID   clopidogrel   75 mg Oral Daily   ezetimibe   10 mg Oral Daily   heparin   5,000 Units Subcutaneous Q8H   lidocaine   1 patch Transdermal Q24H   pantoprazole   40 mg Oral BID    Dialysis Orders:  Assessment/Plan:  AKI on CKD 4 HTN Emergency now resolved Nephrotic syndrome 2/2 T2DM vs Membranous GN 2/2 Ankylosing Spondylitis  Needs Renal Biopsy  Renal function is stable over three days in hospital. Not oliguric, uremic, electrolytes stable, and do not suspect infection. History and exam and prior  studies do not support an obstructive, thrombotic, or ongoing pre-renal cause of his nephropathy and he is euvolemic. Hemodynamic fluctuations surrounding his stroke with hypertensive emergency at admission are contributory alongside his nephrotic syndrome.   At this point continue to hold IV fluids and encourage oral fluid intake. Will defer new intervention for his blood pressure and proteinuria until an outpatient visit with Dr Melia which will occur soon.  Also anticipate a renal biopsy to guide treatment of his nephrotic syndrome. Given the immediate need for DAPT therapy, biopsy goal is 4 weeks from stroke which is roughly 9/8. Current antiplatelet plan is DAPT with plavix  for 3 weeks then ASA alone indefinitely. Can hold ASA three days prior to biopsy then resume. Note ACR 7300 and PCR 14000 in June of this year. Prior negative studies include ANA, light chains, RPR, hepatitis.  - Monitor Daily I/Os, Daily weight  - Maintain MAP>65 for optimal renal perfusion.  - Avoid nephrotoxic agents such as IV contrast, NSAIDs, and phosphate containing bowel preps (FLEETS)   Acute Stroke HLD with likely contribution from nephrotic syndrome AMS - appears resolving Per primary and neurology. Appreciate input for timing of biopsy.    Renal will sign off. Thank you for allowing us  to participate in this patient's care. Please re-engage if needed.  Lonni Africa DO IM Resident PGY-2 05/24/2024, 11:11 AM

## 2024-05-24 NOTE — TOC Initial Note (Signed)
 Transition of Care [facility]) - Initial/Assessment Note    Patient Details  Name: Edward [name] MRN: [id number] Date of Birth: April 02, 1971  Transition of Care [facility]) CM/SW Contact:    Andrez JULIANNA George, RN Phone Number: 05/24/2024, 2:11 PM  Clinical Narrative:                  Pt is from home alone but will go and stay with his girlfriend after d/c. They prefer outpatient therapy in [place] area. CM has sent his information to [facility] in [place] and added information to the AVS. Pt will call to schedule the first appointment.  No DME needs.   SO can provide needed transportation.   Pt manages his own medications and they deny any issues.   IP Care management following.  Expected Discharge Plan: OP Rehab Barriers to Discharge: Continued Medical Work up   Patient Goals and CMS Choice   CMS Medicare.gov Compare Post Acute Care list provided to:: Patient Choice offered to / list presented to : Patient (SO)      Expected Discharge Plan and Services   Discharge Planning Services: CM Consult   Living arrangements for the past 2 months: Single Family Home                                      Prior Living Arrangements/Services Living arrangements for the past 2 months: Single Family Home Lives with:: Self Patient language and need for interpreter reviewed:: Yes Do you feel safe going back to the place where you live?: Yes        Care giver support system in place?: Yes (comment)   Criminal Activity/Legal Involvement Pertinent to Current Situation/Hospitalization: No - Comment as needed  Activities of Daily Living   ADL Screening (condition at time of admission) Independently performs ADLs?: Yes (appropriate for developmental age) Is the patient deaf or have difficulty hearing?: No Does the patient have difficulty seeing, even when wearing glasses/contacts?: No Does the patient have difficulty concentrating, remembering, or making decisions?:  No  Permission Sought/Granted                  Emotional Assessment Appearance:: Appears stated age Attitude/Demeanor/Rapport: Engaged Affect (typically observed): Accepting Orientation: : Oriented to Self, Oriented to Place, Oriented to  Time, Oriented to Situation   Psych Involvement: No (comment)  Admission diagnosis:  Stroke [facility]) [I63.9] Acute renal failure, unspecified acute renal failure type (HCC) [N17.9] Altered mental status, unspecified altered mental status type [R41.82] Cerebrovascular accident (CVA), unspecified mechanism (HCC) [I63.9] Patient Active Problem List   Diagnosis Date Noted   AMS (altered mental status) 05/22/2024   Chronic kidney disease, stage 3b (HCC) 05/22/2024   Stroke (HCC) 05/22/2024   History of GI bleed 03/21/2024   Other proteinuria 01/13/2023   Erectile dysfunction 09/09/2021   Severe episode of recurrent major depressive disorder, without psychotic features (HCC) 09/23/2019   Microalbuminuria 04/14/2016   Encounter for chronic pain management 04/10/2016   OCD (obsessive compulsive disorder) 04/10/2016   Chronic health problem 02/26/2016   Vitamin D  deficiency 02/26/2016   Bipolar 1 disorder, mixed, moderate (HCC) 02/22/2016   Migraines 02/22/2016   Osteoarthritis of both knees 02/22/2016   Controlled substance agreement signed 12/13/2015   Obesity (BMI 30.0-34.9) 08/08/2015   HTN (hypertension) 08/08/2015   Type 2 diabetes mellitus with obesity (HCC) 08/08/2015   Metabolic syndrome 08/08/2015   Hyperlipidemia  08/08/2015   Ankylosing spondylitis (HCC) 08/08/2015   S/P gastric bypass 08/06/2015   Pain in joint of right knee 07/08/2014   PCP:  Donzetta Rollene BRAVO, MD Pharmacy:   [facility] [address] [place] - [address] [place] [address] Phone: [phone] Fax: [phone]     Social Drivers of Health (SDOH) Social History: SDOH Screenings   Food Insecurity: No Food Insecurity  (05/23/2024)  Housing: Low Risk  (05/23/2024)  Transportation Needs: No Transportation Needs (05/23/2024)  Utilities: Not At Risk (05/23/2024)  Alcohol Screen: Low Risk  (03/20/2024)  Depression (PHQ2-9): Medium Risk (03/21/2024)  Financial Resource Strain: Patient Declined (03/20/2024)  Physical Activity: Insufficiently Active (03/20/2024)  Social Connections: Socially Isolated (03/20/2024)  Stress: Stress Concern Present (03/20/2024)  Tobacco Use: Medium Risk (05/22/2024)   SDOH Interventions:     Readmission Risk Interventions     No data to display

## 2024-05-24 NOTE — Progress Notes (Addendum)
 Daily Progress Note Intern Pager: [phone]  Patient name: Edward [name] Medical record number: [id number] Date of birth: 1970/12/08 Age: 53 y.o. Gender: male  Primary Care Provider: Donzetta Rollene BRAVO, MD Consultants: Nephrology, Neurology Code Status: Full  Pt Overview and Major Events to Date:  Admission (8/10)  Assessment and Plan: 53 year old male with PMHx of HTN, HLD, DM2 with obesity, AS, and microalbuminuria presenting with AMS and left sided flank pain. Day 3 of admission being managed for thalamic stroke and chronic worsening of renal function. Assessment & Plan Stroke [facility]) AMS (altered mental status) Clinically improving with better mentation. Neuro deficits appear to be memory and mood-regulation, with mild left sided sensory reduction. - Neurology:  - DAPT 3 weeks, stop Plavix , then stop Aspirin  3 days before biopsy in 4 weeks. Resume Aspirin  only after biopsy - Will need to stay tonight for CPAP tolerability testing  - Echo is unremarkable - PT: Acute skilled PT before DC, outpatient neuro rehab - OT: OT f/u after DC - SLP pending - Medtele, Vital signs per floor - Fall precautions - Delirium precautions Chronic kidney disease, stage 3b (HCC) History of worsening renal function w/ nephrotic studies in past year with increasing creatinine. Stable Cr in the 4.7s. Euvolemic. - Nephrology:  - IR not willing to do inpatient biopsy with aspirin  - Outpatient biopsy in 4 weeks with Dr. Melia - Dr. Melia will follow up with him outpatient for biopsy and protein lowering therapy - Hold IVF, continue PO - Renal function panel daily Hyperlipidemia Chol 273, HDL 33, LDL 202, Trig 190  - Continue Atorvastatin  40mg  + Ezetimibe  10mg   - Follow up lipid panel outpatient Chronic health problem Type 2 diabetes mellitus: Diet controlled  - Follow CBGs  - A1C 7.4  - Continue SSI - Restart on Ozempic  outpatient bc GFR is 14, was non-adherent prior due to changed  states, willing  to go back on it  Hypertension: - Restart on Amlodipine , Carvedilol  since >48 hrs post stroke and BP is now 160/97  Ankylosing Spondylitis: - Discontinue home tramadol  and tizanidine  due to kidney function - Continue oxycodone  2.5 q6 PRN  Bipolar 1: - Not currently on any medication, previously on lithium  d/c 4/24  FEN/GI: Renal diet PPx: Lovenox  Dispo:Home pending clinical improvement . Barriers include sleep study.   Subjective:  53 year old with continuing to improve mild confusion and back pain unsure if from AS or kidney. Neurologically states mild left sided sensation reduction. Discussed non-adherence with Ozempic  use and cutting down on his tramadol /tinzanadine.  Pertinent Past Medical History: Type 2 diabetes mellitus Hypertension Hyperlipidemia CKD stage IIIb-last CR 2.53 Ankylosing spondylitis Bipolar 1   Pertinent Past Surgical History: Roux-en-Y gastric bypass   Pertinent Social History: Tobacco use: Former, quit 4 weeks ago; ppd  Alcohol use: denies Other Substance use: cannabis  Lives with fiance    Pertinent Family History: None Remainder reviewed in history tab.    Important Outpatient Medications: Amlodipine  10 mg daily Farxiga 10 mg daily  carvedilol  3.125 mg twice daily,  Benadryl 25 mg at night,  iron tablets 325,  furosemide  40 mg twice daily.   Magnesium 84 mg daily  pantoprazole  40 mg twice daily  rosuvastatin  5 mg daily,  bicarb 650 mg daily twice daily,  tramadol  50 mg every 6 as needed,  Zinc 50 mg daily Remainder reviewed in medication history.  Objective: Temp:  [97.4 F (36.3 C)-98.7 F (37.1 C)] 97.4 F (36.3 C) (08/12 0412)  Pulse Rate:  [70-88] 84 (08/12 0412) Resp:  [12-23] 18 (08/12 0412) BP: (152-168)/(92-106) 160/97 (08/12 0412) SpO2:  [97 %-99 %] 99 % (08/12 0412) Physical Exam: General: NAD, sitting on chair  Cardiovascular: RRR no murmurs Respiratory: CTAB breathing comfortably Abdomen: No TTP, soft,  non-distended Extremities: Full ROM, no deformities Neuro: AxOx4. CN II-XII intact. Strength 5/5 in all extremities. Sensations intact and slightly decreased on left.   Laboratory: Most recent CBC Lab Results  Component Value Date   WBC 8.3 05/22/2024   HGB 13.1 05/22/2024   HCT 41.4 05/22/2024   MCV 89.2 05/22/2024   PLT 360 05/22/2024   Most recent BMP    Latest Ref Rng & Units 05/24/2024    4:25 AM  BMP  Glucose 70 - 99 mg/dL 886   BUN 6 - 20 mg/dL 61   Creatinine 9.38 - 1.24 mg/dL 5.24   Sodium 864 - 854 mmol/L 141   Potassium 3.5 - 5.1 mmol/L 4.8   Chloride 98 - 111 mmol/L 106   CO2 22 - 32 mmol/L 24   Calcium  8.9 - 10.3 mg/dL 8.7    Other pertinent labs - A1C 7.4 - BMP: Gluc 136>113, BUN 60>61, Cr 4.71>4.75, Albumin 2.4>2.5, GFR 14>14  Imaging/Diagnostic Tests: No new  Previous Radiologist Impression: MRA HEAD:  1. Negative intracranial MRA for large vessel occlusion. 2. Moderate long segment narrowing/stenosis of the right P2 segment. 3. Focal mild-to-moderate stenosis involving the proximal dominant left V4 segment.   MRA NECK: 1. Normal MRA of the neck.   Echo  1. Left ventricular ejection fraction, by estimation, is 60 to 65%. The  left ventricle has normal function. The left ventricle has no regional  wall motion abnormalities. Left ventricular diastolic parameters were  normal.   2. Right ventricular systolic function is normal. The right ventricular  size is normal.   3. The mitral valve is normal in structure. No evidence of mitral valve  regurgitation. No evidence of mitral stenosis.   4. The aortic valve is tricuspid. Aortic valve regurgitation is not  visualized. No aortic stenosis is present.   5. The inferior vena cava is normal in size with greater than 50%  respiratory variability, suggesting right atrial pressure of 3 mmHg.   6. Agitated saline contrast bubble study was negative, with no evidence  of any interatrial shunt.    MRI  brain: 1. 8 mm acute ischemic nonhemorrhagic infarct involving the ventral left thalamus. 2. Underlying age advanced chronic microvascular ischemic disease with a few scattered remote lacunar infarcts about the left basal ganglia and left thalamus.   CT Head: 1. No acute intracranial abnormality. 2. 2 separate remote lacunar infarcts within the left thalamus. 3. Subtle white matter hypoattenuation is mildly advanced for age. This may reflect underlying microvascular ischemia.   CT abdomen pelvis: 1. No acute findings or explanation for the patient's symptoms. 2. No evidence of urinary tract calculus or hydronephrosis. Mild nonspecific perinephric soft tissue stranding bilaterally which could be secondary to underlying renal inflammation. Correlate clinically. 3. Cholelithiasis without evidence of cholecystitis or biliary ductal dilatation. 4. 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 5.  Aortic Atherosclerosis [name], Medical Student 05/24/2024, 7:00 AM  FM [facility] FPTS Intern pager: [phone], text pages welcome Secure chat group [facility] Teaching Service   I was personally present and performed or re-performed the history, physical exam and medical decision making activities of this service and have verified  that the service and findings are accurately documented in the student's note.  Gladis Church, DO                  05/24/2024, 12:23 PM

## 2024-05-24 NOTE — Assessment & Plan Note (Addendum)
 Chol 273, HDL 33, LDL 202, Trig 190  - Continue Atorvastatin  40mg  + Ezetimibe  10mg   - Follow up lipid panel outpatient

## 2024-05-24 NOTE — Plan of Care (Signed)
  Problem: Education: Goal: Knowledge of disease or condition will improve Outcome: Progressing   Problem: Education: Goal: Knowledge of patient specific risk factors will improve (DELETE if not current risk factor) Outcome: Progressing   Problem: Coping: Goal: Will verbalize positive feelings about self Outcome: Progressing   Problem: Coping: Goal: Will identify appropriate support needs Outcome: Progressing

## 2024-05-24 NOTE — Assessment & Plan Note (Addendum)
 Type 2 diabetes mellitus: Diet controlled  - Follow CBGs  - A1C 7.4  - Continue SSI - Restart on Ozempic  outpatient bc GFR is 14, was non-adherent prior due to changed  states, willing to go back on it  Hypertension: - Restart on Amlodipine , Carvedilol  since >48 hrs post stroke and BP is now 160/97  Ankylosing Spondylitis: - Discontinue home tramadol  and tizanidine  due to kidney function - Continue oxycodone  2.5 q6 PRN  Bipolar 1: - Not currently on any medication, previously on lithium  d/c 4/24

## 2024-05-25 ENCOUNTER — Other Ambulatory Visit (HOSPITAL_COMMUNITY): Payer: Self-pay

## 2024-05-25 DIAGNOSIS — R29701 NIHSS score 1: Secondary | ICD-10-CM | POA: Diagnosis not present

## 2024-05-25 DIAGNOSIS — I639 Cerebral infarction, unspecified: Secondary | ICD-10-CM | POA: Diagnosis not present

## 2024-05-25 DIAGNOSIS — I6389 Other cerebral infarction: Secondary | ICD-10-CM | POA: Diagnosis not present

## 2024-05-25 DIAGNOSIS — N1832 Chronic kidney disease, stage 3b: Secondary | ICD-10-CM | POA: Diagnosis not present

## 2024-05-25 DIAGNOSIS — I129 Hypertensive chronic kidney disease with stage 1 through stage 4 chronic kidney disease, or unspecified chronic kidney disease: Secondary | ICD-10-CM | POA: Diagnosis not present

## 2024-05-25 LAB — RENAL FUNCTION PANEL
Albumin: 2.3 g/dL — ABNORMAL LOW (ref 3.5–5.0)
Anion gap: 11 (ref 5–15)
BUN: 61 mg/dL — ABNORMAL HIGH (ref 6–20)
CO2: 22 mmol/L (ref 22–32)
Calcium: 8.8 mg/dL — ABNORMAL LOW (ref 8.9–10.3)
Chloride: 108 mmol/L (ref 98–111)
Creatinine, Ser: 4.67 mg/dL — ABNORMAL HIGH (ref 0.61–1.24)
GFR, Estimated: 14 mL/min — ABNORMAL LOW (ref >60)
Glucose, Bld: 122 mg/dL — ABNORMAL HIGH (ref 70–99)
Phosphorus: 5.4 mg/dL — ABNORMAL HIGH (ref 2.5–4.6)
Potassium: 4.7 mmol/L (ref 3.5–5.1)
Sodium: 141 mmol/L (ref 135–145)

## 2024-05-25 LAB — GLUCOSE, CAPILLARY
Glucose-Capillary: 239 mg/dL — ABNORMAL HIGH (ref 70–99)
Glucose-Capillary: 116 mg/dL — ABNORMAL HIGH (ref 70–99)

## 2024-05-25 LAB — MAGNESIUM: Magnesium: 2.1 mg/dL (ref 1.7–2.4)

## 2024-05-25 MED ORDER — OXYCODONE HCL 5 MG PO TABS
2.5000 mg | ORAL_TABLET | Freq: Four times a day (QID) | ORAL | 0 refills | Status: DC | PRN
  Filled 2024-05-25: qty 10, 5d supply, fill #0

## 2024-05-25 MED ORDER — ASPIRIN 81 MG PO TBEC
81.0000 mg | DELAYED_RELEASE_TABLET | Freq: Every day | ORAL | 0 refills | Status: DC
  Filled 2024-05-25: qty 30, 30d supply, fill #0

## 2024-05-25 MED ORDER — ATORVASTATIN CALCIUM 40 MG PO TABS
40.0000 mg | ORAL_TABLET | Freq: Every day | ORAL | 0 refills | Status: DC
  Filled 2024-05-25: qty 30, 30d supply, fill #0

## 2024-05-25 MED ORDER — PANTOPRAZOLE SODIUM 40 MG PO TBEC
40.0000 mg | DELAYED_RELEASE_TABLET | Freq: Every day | ORAL | 0 refills | Status: DC
  Filled 2024-05-25: qty 30, 30d supply, fill #0

## 2024-05-25 MED ORDER — EZETIMIBE 10 MG PO TABS
10.0000 mg | ORAL_TABLET | Freq: Every day | ORAL | 0 refills | Status: DC
  Filled 2024-05-25: qty 30, 30d supply, fill #0

## 2024-05-25 MED ORDER — CLOPIDOGREL BISULFATE 75 MG PO TABS
75.0000 mg | ORAL_TABLET | Freq: Every day | ORAL | 0 refills | Status: AC
  Filled 2024-05-25: qty 19, 19d supply, fill #0

## 2024-05-25 NOTE — Discharge Summary (Addendum)
 Family Medicine Teaching [facility] Discharge Summary  Patient name: Edward [name] Medical record number: [id number] Date of birth: 1971-07-02 Age: 53 y.o. Gender: male Date of Admission: 05/22/2024  Date of Discharge: 05/25/24 Admitting Physician: [name], MD  Primary Care Provider: Donzetta Rollene BRAVO, MD Consultants: neurology,nephrology  Indication for Hospitalization: stroke  Discharge Diagnoses/Problem List:  Principal Problem for Admission: stroke Other Problems addressed during stay:  Principal Problem:   Stroke [facility]) Active Problems:   Hyperlipidemia   AMS (altered mental status)   Chronic kidney disease, stage 3b [facility])    Brief Hospital Course:  Jammy Plotkin is a 53 y.o. male presenting with AMS and left sided flank pain   Stroke, AMS: Patient presented with AMS have been worsening since 05/18/24 and found to have lacunar infarcts on CT head and 8 mm acute ischemic nonhemorrhagic stroke involving ventral left thalamus on head MRI. Echo unremarkable. A1C 7.4. Started atorvastatin  + ezetemibe instead for high intensity statin therapy for stroke and LDL level of 202. Neurology recommends DAPT for 3 weeks, then keeping only aspirin  until renal biopsy in 4 weeks, then continue aspirin  after. Pt was also given CPAP prior to discharge.   CKD stage 3b: Creatinine of 4.76 in the ED, with baseline creatinine of 2-2.5.  Suspected nephrotic syndrome given very elevated protein creatinine ratio outpatient.  Due to necessity of aspirin  and plavix  post stroke, renal biopsy deferred until outpatient in 4 weeks.   Other chronic conditions were medically managed with home medications and formulary alternatives as necessary (HTN, T2DM, HLD, ankylosing spondylitis, Bipolar 1)  PCP Follow-up Recommendations: Ensure follow up with nephrology, they want to do a renal biopsy in 4 weeks.  Will take Plavix  for 3 weeks, then ASA alone Discontinued Tramadol  for AS pain (AKI) and  sent home with oxy, assess benefit He is interested in GLP1 for DM control, was previously approved    Results/Tests Pending at Time of Discharge:  Unresulted Labs (From admission, onward)    None        Disposition: home  Discharge Condition: stable  Discharge Exam:  Vitals:   05/25/24 1146 05/25/24 1152  BP: (!) 163/96 (!) 163/96  Pulse: 85 85  Resp: 20 18  Temp: 97.8 F (36.6 C) 97.8 F (36.6 C)  SpO2: 100% 100%   General: well appearing, NAD Cardiovascular: RRR, no m/r/g Respiratory: normal work of breathing on RA, CTAB Abdomen: Normal bowel sounds, soft, non-tender Extremities: No swelling BLE  Significant Labs and Imaging:  No results for input(s): WBC, HGB, HCT, PLT in the last 48 hours. Recent Labs  Lab 05/24/24 0425 05/25/24 0350  NA 141 141  K 4.8 4.7  CL 106 108  CO2 24 22  GLUCOSE 113* 122*  BUN 61* 61*  CREATININE 4.75* 4.67*  CALCIUM  8.7* 8.8*  MG  --  2.1  PHOS 5.9* 5.4*  ALBUMIN 2.5* 2.3*   Echo: LVEF 60-65%, no RWMA, normal RV function   CT Head 8/10: 2 remote lacunar infarcts within the left thalamus  CT A/P 05/22/24 IMPRESSION: 1. No acute findings or explanation for the patient's symptoms. 2. No evidence of urinary tract calculus or hydronephrosis. Mild nonspecific perinephric soft tissue stranding bilaterally which could be secondary to underlying renal inflammation. Correlate clinically. 3. Cholelithiasis without evidence of cholecystitis or biliary ductal dilatation. 4. 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 5.  Aortic Atherosclerosis (ICD10-I70.0).   MR Brain WO Contrast 05/22/24 IMPRESSION: 1. 8 mm acute ischemic nonhemorrhagic infarct  involving the ventral left thalamus. 2. Underlying age advanced chronic microvascular ischemic disease with a few scattered remote lacunar infarcts about the left basal ganglia and left thalamus.  MRA Head/Neck 05/23/24 IMPRESSION: MRA HEAD: 1. Negative  intracranial MRA for large vessel occlusion. 2. Moderate long segment narrowing/stenosis of the right P2 segment. 3. Focal mild-to-moderate stenosis involving the proximal dominant left V4 segment.   MRA NECK: Normal MRA of the neck.   Discharge Medications:  Allergies as of 05/25/2024       Reactions   Demerol [meperidine] Swelling   Swelling of hands, neck   Nsaids Other (See Comments)   Gi upset, Per patient he is able to take some NSAIDs.   Tapentadol Other (See Comments)   Skin crawling        Medication List     STOP taking these medications    diphenhydrAMINE 25 MG tablet Commonly known as: BENADRYL   furosemide  40 MG tablet Commonly known as: LASIX    rosuvastatin  5 MG tablet Commonly known as: CRESTOR    sodium bicarbonate  650 MG tablet   traMADol  50 MG tablet Commonly known as: ULTRAM        TAKE these medications    amLODipine  10 MG tablet Commonly known as: NORVASC  Take 1 tablet (10 mg total) by mouth daily.   aspirin  EC 81 MG tablet Take 1 tablet (81 mg total) by mouth daily. Swallow whole.   atorvastatin  40 MG tablet Commonly known as: LIPITOR Take 1 tablet (40 mg total) by mouth daily.   b complex vitamins capsule Take 1 capsule by mouth daily.   carvedilol  3.125 MG tablet Commonly known as: COREG  Take 3.125 mg by mouth 2 (two) times daily. What changed: Another medication with the same name was removed. Continue taking this medication, and follow the directions you see here.   clopidogrel  75 MG tablet Commonly known as: PLAVIX  Take 1 tablet (75 mg total) by mouth daily for 19 days.   Dexcom G7 Receiver Devi Use to monitor blood sugar   Dexcom G7 Sensor Misc Use to monitor blood sugar   ezetimibe  10 MG tablet Commonly known as: ZETIA  Take 1 tablet (10 mg total) by mouth daily.   ferrous sulfate  325 (65 FE) MG tablet Take 1 tablet (325 mg total) by mouth daily with breakfast.   magnesium 84 MG ( ) Tbcr SR tablet Commonly  known as: MAGTAB Take 250 mg by mouth daily.   multivitamin tablet Take 1 tablet by mouth daily.   oxyCODONE  5 MG immediate release tablet Commonly known as: Oxy IR/ROXICODONE  Take 0.5 tablets (2.5 mg total) by mouth every 6 (six) hours as needed for severe pain (pain score 7-10) or moderate pain (pain score 4-6).   pantoprazole  40 MG tablet Commonly known as: PROTONIX  Take 1 tablet (40 mg total) by mouth daily. What changed: when to take this   zinc gluconate 50 MG tablet Take 50 mg by mouth daily.        Discharge Instructions: Please refer to Patient Instructions section of EMR for full details.  Patient was counseled important signs and symptoms that should prompt return to medical care, changes in medications, dietary instructions, activity restrictions, and follow up appointments.   Follow-Up Appointments:  Follow-up Information     [facility] in [place]. Schedule an appointment as soon as possible for a visit.   Why: For Outpatient therapy Contact information: [address], [place] [address]  [phone]  Orry Sigl, DO 05/25/2024, 6:40 PM PGY-2, [facility]

## 2024-05-25 NOTE — Progress Notes (Signed)
 Physical Therapy Treatment Patient Details Name: Edward [name] MRN: [id number] DOB: 10-23-1970 Today's Date: 05/25/2024   History of Present Illness Pt is a 53 y.o. male presenting 8/10 with confusion for several days, L flank pain. MRI brain with 8 mm acute ischemic nonhemorrhagic infarct involving the ventral  left thalamus, age advanced chronic microvascular ischemic disease  with a few scattered remote lacunar infarcts about the left basal ganglia and left thalamus.  Also found to have worsening renal function. PMH: DMII, HTN, HLD, ankylosing spondylitis, bipolar 1, CKD IIIb.   PT Comments  Pt progressing well towards acute PT goals. Pt was slightly unsteady with initial gait training and would reach for UE support if available. Pt reported usually having discomfort in B knees and low back upon waking up with improvements with mobility. Worked on LE strength and dynamic balance with CGA for safety. Continue to recommend neuro OP PT to address deficits. Acute PT to follow.     If plan is discharge home, recommend the following: Assistance with cooking/housework;Assist for transportation;Help with stairs or ramp for entrance   Can travel by private vehicle      Yes  Equipment Recommendations  None recommended by PT       Precautions / Restrictions Restrictions Weight Bearing Restrictions Per Provider Order: No     Mobility  Bed Mobility    General bed mobility comments: in recliner    Transfers Overall transfer level: Modified independent Equipment used: None Transfers: Sit to/from Stand Sit to Stand: Modified independent (Device/Increase time)   Ambulation/Gait Ambulation/Gait assistance: Contact guard assist Gait Distance (Feet): 400 Feet Assistive device: None Gait Pattern/deviations: Step-through pattern, Decreased stance time - right, Drifts right/left Gait velocity: dec     General Gait Details: slight instability noted with pt occasionally reaching for UE support.  Improved stability with increased gait distance.   Stairs Stairs: Yes Stairs assistance: Contact guard assist Stair Management: One rail Right, Step to pattern, Forwards Number of Stairs: 8 General stair comments: steady with use of one handrail  Modified Rankin (Stroke Patients Only) Modified Rankin (Stroke Patients Only) Pre-Morbid Rankin Score: No significant disability Modified Rankin: Moderately severe disability     Balance Overall balance assessment: Needs assistance Sitting-balance support: No upper extremity supported, Feet supported Sitting balance-Leahy Scale: Good     Standing balance support: No upper extremity supported Standing balance-Leahy Scale: Fair    High Level Balance Comments: Fast/slow gait speed changes, sudden stops            Communication Communication Communication: Impaired Factors Affecting Communication: Reduced clarity of speech  Cognition Arousal: Alert Behavior During Therapy: WFL for tasks assessed/performed   PT - Cognitive impairments: No family/caregiver present to determine baseline, Memory    Following commands: Intact      Cueing Cueing Techniques: Verbal cues  Exercises Other Exercises Other Exercises: x10 anterior step ups, x10 lateral step ups with UE support        Pertinent Vitals/Pain Pain Assessment Pain Assessment: Faces Faces Pain Scale: Hurts even more Pain Location: B knees, back Pain Descriptors / Indicators: Aching, Discomfort Pain Intervention(s): Limited activity within patient's tolerance, Monitored during session, Repositioned     PT Goals (current goals can now be found in the care plan section) Acute Rehab PT Goals PT Goal Formulation: With patient Time For Goal Achievement: 06/06/24 Potential to Achieve Goals: Good Progress towards PT goals: Progressing toward goals    Frequency    Min 2X/week  AM-PAC PT 6 Clicks Mobility   Outcome Measure  Help needed turning from your  back to your side while in a flat bed without using bedrails?: None Help needed moving from lying on your back to sitting on the side of a flat bed without using bedrails?: None Help needed moving to and from a bed to a chair (including a wheelchair)?: None Help needed standing up from a chair using your arms (e.g., wheelchair or bedside chair)?: A Little Help needed to walk in hospital room?: A Little Help needed climbing 3-5 steps with a railing? : A Little 6 Click Score: 21    End of Session   Activity Tolerance: Patient tolerated treatment well Patient left: in chair;with call bell/phone within reach;Other (comment) (MD present) Nurse Communication: Mobility status PT Visit Diagnosis: Unsteadiness on feet (R26.81);Other abnormalities of gait and mobility (R26.89);Other symptoms and signs involving the nervous system (R29.898)     Time: 9191-9172 PT Time Calculation (min) (ACUTE ONLY): 19 min  Charges:    $Therapeutic Activity: 8-22 mins PT General Charges $$ ACUTE PT VISIT: 1 Visit                     Edward [name], PT, DPT Secure Chat Preferred  Rehab Office [phone]   Edward [name] 05/25/2024, 8:40 AM

## 2024-05-25 NOTE — Evaluation (Signed)
 Speech Language Pathology Evaluation Patient Details Name: Edward [name] MRN: [id number] DOB: Aug 08, 1971 Today's Date: 05/25/2024 Time: 8771-8689 SLP Time Calculation (min) (ACUTE ONLY): 42 min  Problem List:  Patient Active Problem List   Diagnosis Date Noted   AMS (altered mental status) 05/22/2024   Chronic kidney disease, stage 3b (HCC) 05/22/2024   Stroke (HCC) 05/22/2024   History of GI bleed 03/21/2024   Other proteinuria 01/13/2023   Erectile dysfunction 09/09/2021   Severe episode of recurrent major depressive disorder, without psychotic features (HCC) 09/23/2019   Microalbuminuria 04/14/2016   Encounter for chronic pain management 04/10/2016   OCD (obsessive compulsive disorder) 04/10/2016   Chronic health problem 02/26/2016   Vitamin D  deficiency 02/26/2016   Bipolar 1 disorder, mixed, moderate (HCC) 02/22/2016   Migraines 02/22/2016   Osteoarthritis of both knees 02/22/2016   Controlled substance agreement signed 12/13/2015   Obesity (BMI 30.0-34.9) 08/08/2015   HTN (hypertension) 08/08/2015   Type 2 diabetes mellitus with obesity (HCC) 08/08/2015   Metabolic syndrome 08/08/2015   Hyperlipidemia 08/08/2015   Ankylosing spondylitis (HCC) 08/08/2015   S/P gastric bypass 08/06/2015   Pain in joint of right knee 07/08/2014   Past Medical History:  Past Medical History:  Diagnosis Date   Renal disorder    CKD stage 4   Rheumatic fever age 64   Past Surgical History:  Past Surgical History:  Procedure Laterality Date   arthroscopic surgery Left years ago   both knee cartlidge surgery     GASTRIC ROUX-EN-Y N/A 08/06/2015   Procedure: LAPAROSCOPIC ROUX-EN-Y GASTRIC BYPASS WITH UPPER ENDOSCOPY;  Surgeon: Camellia Blush, MD;  Location: [facility];  Service: General;  Laterality: N/A;   HIP SURGERY Left 1986   with knee surgery   KNEE SURGERY Right 1986   benign tumor with bone graft    UPPER GI ENDOSCOPY  08/06/2015   Procedure: UPPER GI ENDOSCOPY;  Surgeon: Camellia Blush, MD;  Location: [facility];  Service: General;;   HPI:  [name] is a 53 y.o. male presenting with AMS and left sided flank pain . Differential for presentation of this includes stroke, uremia, DKA.     Stroke confirmed on MRI most likely contributing to AMS.  Possible differential could be hypercoagulable state secondary to A-fib although EKG showed NSR.  Could also be secondary to nephrotic syndrome, will need additional workup per nephrology.  Could also be secondary to known diabetes and hyperlipidemia will check labs in the morning.CT head showing 2 lacunar infarcts, head MRI showing 8 mm acute ischemic nonhemorrhagic stroke involving the ventral left thalamus. ST consulted for speech/language cognitive assessment.   Assessment / Plan / Recommendation Clinical Impression  Pt was assessed for speech/language and cognition via the [employer] Mental Status Examination (SLUMS) with a total score obtained of 29/30 with a typical score on this assessment being 27/30.  Pt voiced concerns with processing information slower and his mind going blank and experiencing decreased recall of information/short-term memory deficits intermittently during simple/complex tasks and recalling familiar information such as his daughter's birthday initially.  Pt is employed as a Warden/ranger and performs cognitive assessments and is concerned his cognitive function is compromised/variable d/t CVA.  Discussed compensatory strategies (pt was using independently intermittently during assessment) to utilize when experiencing difficulty recalling information and pt in agreement.  Recommend f/u prn for OP SLP and/or neurological assessment for further cognitive assessment if symptoms persist and/or pt does not return to his baseline cognitive functioning.  Thank you for  this consult.    SLP Assessment  SLP Recommendation/Assessment: All further Speech Language Pathology needs can be addressed in the next venue of  care SLP Visit Diagnosis: Cognitive communication deficit (R41.841)     Assistance Recommended at Discharge  Other (comment) (TBD)  Functional Status Assessment Patient has had a recent decline in their functional status and demonstrates the ability to make significant improvements in function in a reasonable and predictable amount of time.  Frequency and Duration  (evaluation only)         SLP Evaluation Cognition  Overall Cognitive Status: Impaired/Different from baseline Arousal/Alertness: Awake/alert Orientation Level: Oriented X4 Attention: Sustained Sustained Attention: Appears intact Memory: Impaired Memory Impairment: Decreased short term memory Decreased Short Term Memory: Functional complex;Verbal complex Awareness: Appears intact Problem Solving: Appears intact Executive Function: Organizing;Sequencing Sequencing: Impaired Sequencing Impairment: Verbal complex;Functional complex Organizing: Impaired Organizing Impairment: Verbal complex;Functional complex Behaviors: Perseveration Safety/Judgment: Appears intact       Comprehension  Auditory Comprehension Overall Auditory Comprehension: Appears within functional limits for tasks assessed Conversation: Complex Interfering Components: Anxiety;Processing speed;Working Radio broadcast assistant: Barrister's clerk: Within [employer] Reading Comprehension Reading Status: Not tested    Expression Expression Primary Mode of Expression: Verbal Verbal Expression Overall Verbal Expression: Appears within functional limits for tasks assessed Level of Generative/Spontaneous Verbalization: Conversation Naming: Not tested Pragmatics: No impairment Non-Verbal Means of Communication: Not applicable Written Expression Dominant Hand: Right Written Expression: Within Functional Limits   Oral / Motor  Oral Motor/Sensory Function Overall Oral Motor/Sensory  Function: Within functional limits Motor Speech Overall Motor Speech: Appears within functional limits for tasks assessed Respiration: Within functional limits Phonation: Normal Resonance: Within functional limits Articulation: Within functional limitis Intelligibility: Intelligible Motor Planning: Within functional limits Motor Speech Errors: Not applicable            Pat Garritt Molyneux,M.S.,CCC-SLP 05/25/2024, 1:38 PM

## 2024-05-25 NOTE — Progress Notes (Signed)
 Daily Progress Note Intern Pager: [phone]  Patient name: Edward [name] Medical record number: [id number] Date of birth: 07-25-1971 Age: 53 y.o. Gender: male  Primary Care Provider: Donzetta Rollene BRAVO, MD Consultants: Nephrology, Neurology Code Status: Full  Pt Overview and Major Events to Date:  Admitted (8/10) First sleep study (8/11) CPAP study (8/12)  Assessment and Plan: 53 year old male with PMHx of HTN, HLD, DM2 with obesity, AS, and microalbuminuria presenting with AMS and left sided flank pain. Day 4 of admission being managed for thalamic stroke and chronic worsening of renal function. Anticipating DC today post CPAP sleep study and coordination with neuro. Assessment & Plan Stroke [facility]) AMS (altered mental status) Clinically improving with better mentation. Neuro deficits appear to be memory and mood-regulation, with mild left sided sensory reduction. - Neurology: - DAPT 3 weeks, stop Plavix , then stop Aspirin  3 days before biopsy in 4 weeks. Resume Aspirin  only after biopsy - Stayed on for 4.5hrs on CPAP, reports improved energy and less confusion - PT: outpatient neuro rehab - OT: OT f/u - SLP pending - Medtele, vital signs per floor - Fall precautions - Delirium precautions  Chronic kidney disease, stage 3b (HCC) History of worsening renal function w/ nephrotic studies in past year with increasing creatinine. Stable Cr in the 4.7s. Euvolemic. - Nephrology:  - Dr. Melia will follow up with him outpatient for biopsy in 4 weeks and protein lowering therapy - Hold IVF, continue PO - Renal function panel daily  Hyperlipidemia - Continue Atorvastatin  40mg  + Ezetimibe  10mg   - F/u lipid panel outpatient  Chronic health problem Type 2 diabetes mellitus: Diet controlled  - A1C 7.4  - Continue SSI inpatient - Restart on ozempic  outpatient bc GFR is 14, was non-adherent prior due to changing states, willing to go back on it  Hypertension: - Continue on amlodipine   and carvedilol  since no longer permissive HTN  Ankylosing Spondylitis: - Discontinue home tramadol  and tizanidine  due to kidney function - Continue oxycodone  2.5 q6 PRN  Bipolar 1: - Not currently on any medication, previously on lithium  d/c 4/24   FEN/GI: Renal PPx: Lovenox  Dispo:Home today  Subjective:  53 year old with continuing improvement of mild confusion and back pain unsure if from AS or kidney   Objective: Temp:  [97.4 F (36.3 C)-98.2 F (36.8 C)] 98.2 F (36.8 C) (08/13 0751) Pulse Rate:  [74-90] 78 (08/13 0751) Resp:  [18-19] 18 (08/13 0751) BP: (103-166)/(70-99) 166/98 (08/13 0751) SpO2:  [95 %-100 %] 100 % (08/13 0751) Physical Exam: General: NAD, sitting on chair  Cardiovascular: RRR no murmurs Respiratory: CTAB breathing comfortably Abdomen: No TTP, soft, non-distended Extremities: Full ROM, no deformities Neuro: AxOx4. CN II-XII intact. Strength 5/5 in all extremities. Sensations intact and slightly decreased on left.   Laboratory: Most recent CBC Lab Results  Component Value Date   WBC 8.3 05/22/2024   HGB 13.1 05/22/2024   HCT 41.4 05/22/2024   MCV 89.2 05/22/2024   PLT 360 05/22/2024   Most recent BMP    Latest Ref Rng & Units 05/25/2024    3:50 AM  BMP  Glucose 70 - 99 mg/dL 877   BUN 6 - 20 mg/dL 61   Creatinine 9.38 - 1.24 mg/dL 5.32   Sodium 864 - 854 mmol/L 141   Potassium 3.5 - 5.1 mmol/L 4.7   Chloride 98 - 111 mmol/L 108   CO2 22 - 32 mmol/L 22   Calcium  8.9 - 10.3 mg/dL 8.8  Other pertinent labs: Gluc  113>122 Cr 4.75>4.67 Albumin 2.5>2.3 GFR 14>14 A1C 7.4  Imaging/Diagnostic Tests: No new   Previous Radiologist Impression: MRA HEAD:  1. Negative intracranial MRA for large vessel occlusion. 2. Moderate long segment narrowing/stenosis of the right P2 segment. 3. Focal mild-to-moderate stenosis involving the proximal dominant left V4 segment.   MRA NECK: 1. Normal MRA of the neck.   Echo  1. Left  ventricular ejection fraction, by estimation, is 60 to 65%. The  left ventricle has normal function. The left ventricle has no regional  wall motion abnormalities. Left ventricular diastolic parameters were  normal.   2. Right ventricular systolic function is normal. The right ventricular  size is normal.   3. The mitral valve is normal in structure. No evidence of mitral valve  regurgitation. No evidence of mitral stenosis.   4. The aortic valve is tricuspid. Aortic valve regurgitation is not  visualized. No aortic stenosis is present.   5. The inferior vena cava is normal in size with greater than 50%  respiratory variability, suggesting right atrial pressure of 3 mmHg.   6. Agitated saline contrast bubble study was negative, with no evidence  of any interatrial shunt.    MRI brain: 1. 8 mm acute ischemic nonhemorrhagic infarct involving the ventral left thalamus. 2. Underlying age advanced chronic microvascular ischemic disease with a few scattered remote lacunar infarcts about the left basal ganglia and left thalamus.   CT Head: 1. No acute intracranial abnormality. 2. 2 separate remote lacunar infarcts within the left thalamus. 3. Subtle white matter hypoattenuation is mildly advanced for age. This may reflect underlying microvascular ischemia.   CT abdomen pelvis: 1. No acute findings or explanation for the patient's symptoms. 2. No evidence of urinary tract calculus or hydronephrosis. Mild nonspecific perinephric soft tissue stranding bilaterally which could be secondary to underlying renal inflammation. Correlate clinically. 3. Cholelithiasis without evidence of cholecystitis or biliary ductal dilatation. 4. 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 5.  Aortic Atherosclerosis  [name], Medical Student 05/25/2024, 10:18 AM  [facility] FPTS Intern pager: [phone], text pages welcome Secure chat group [facility] Teaching Service   I was personally present and performed or re-performed the history, physical exam and medical decision making activities of this service and have verified that the service and findings are accurately documented in the student's note.  Victorio Creeden, DO                  05/25/2024, 6:26 PM

## 2024-05-25 NOTE — Assessment & Plan Note (Addendum)
 Clinically improving with better mentation. Neuro deficits appear to be memory and mood-regulation, with mild left sided sensory reduction. - Neurology: - DAPT 3 weeks, stop Plavix , then stop Aspirin  3 days before biopsy in 4 weeks. Resume Aspirin  only after biopsy - Stayed on for 4.5hrs on CPAP, reports improved energy and less confusion - PT: outpatient neuro rehab - OT: OT f/u - SLP pending - Medtele, vital signs per floor - Fall precautions - Delirium precautions

## 2024-05-25 NOTE — Progress Notes (Signed)
 Occupational Therapy Treatment Patient Details Name: Stefanos Haynesworth MRN: [id number] DOB: Dec 04, 1970 Today's Date: 05/25/2024   History of present illness Pt is a 53 y.o. male presenting 8/10 with confusion for several days, L flank pain. MRI brain with 8 mm acute ischemic nonhemorrhagic infarct involving the ventral  left thalamus, age advanced chronic microvascular ischemic disease  with a few scattered remote lacunar infarcts about the left basal ganglia and left thalamus.  Also found to have worsening renal function. PMH: DMII, HTN, HLD, ankylosing spondylitis, bipolar 1, CKD IIIb.   OT comments  Pt progressing toward established OT goals. Challenged memory, sequencing, divided attention and multitasking (see cognitive section below) with pt with notably decreased processing speed esp during dual tasking. Pt also asking several times if he has had anxiety medication and pain medication with decreased recall of both answer and which medications were administered by RN during session. Encouraged use of memory aids in home setting as well as discussed how to challenge memory and multitasking/higher level attention in the home setting in safe ways. Provided RUE HEP as well with blue theraband.       If plan is discharge home, recommend the following:  Other (comment) (on pt request)   Equipment Recommendations  None recommended by OT    Recommendations for Other Services Speech consult    Precautions / Restrictions Restrictions Weight Bearing Restrictions Per Provider Order: No       Mobility Bed Mobility               General bed mobility comments: in recliner    Transfers Overall transfer level: Modified independent                       Balance Overall balance assessment: Needs assistance Sitting-balance support: No upper extremity supported, Feet supported Sitting balance-Leahy Scale: Good     Standing balance support: No upper extremity supported Standing  balance-Leahy Scale: Fair                             ADL either performed or assessed with clinical judgement   ADL                                         General ADL Comments: focus session on dual tasking and UE HEP    Extremity/Trunk Assessment Upper Extremity Assessment Upper Extremity Assessment: Right hand dominant;RUE deficits/detail RUE Deficits / Details: mild decr coordination and gross strength more notable during finger to nose than dysdiadokinesia testing; provided UE strengthening HEP with blue theraband   Lower Extremity Assessment Lower Extremity Assessment: Defer to PT evaluation        Vision   Vision Assessment?: No apparent visual deficits   Perception Perception Perception: Within Functional Limits   Praxis Praxis Praxis: WFL   Communication Communication Communication: Impaired Factors Affecting Communication: Reduced clarity of speech (occasional slurred speech)   Cognition Arousal: Alert Behavior During Therapy: WFL for tasks assessed/performed Cognition: Cognition impaired     Awareness: Intellectual awareness intact, Online awareness intact Memory impairment (select all impairments): Short-term memory Attention impairment (select first level of impairment): Alternating attention, Divided attention, Selective attention (selective attention as it pertains to multistep directions and following commands/recall of directions in distracting environment) Executive functioning impairment (select all impairments): Organization, Problem solving, Sequencing OT - Cognition Comments:  Challenging ability to dual task, alternate attention, and memory with multitasking prompts this session with pt following multistep directions (up to 3 instructions), and following simultaneously with other instructions. Provided a chart with directional terms on one side, action terms on the other, pt able to coordinate action on desired side with  increased time; added cognitive challenge of performing cognitive task such as stating color of box instruction is written in while following the instruction. pt with fair to good performance, but easily distracted with individuals entering room and talking in hall.                 Following commands: Intact        Cueing   Cueing Techniques: Verbal cues  Exercises      Shoulder Instructions       General Comments      Pertinent Vitals/ Pain       Pain Assessment Pain Assessment: No/denies pain Faces Pain Scale: Hurts little more Pain Location: B knees, back Pain Descriptors / Indicators: Aching, Discomfort Pain Intervention(s): Limited activity within patient's tolerance, Monitored during session  Home Living     Available Help at Discharge: Family;Available PRN/intermittently Type of Home: House                              Lives With: Spouse;Family    Prior Functioning/Environment              Frequency  Min 2X/week        Progress Toward Goals  OT Goals(current goals can now be found in the care plan section)  Progress towards OT goals: Progressing toward goals  Acute Rehab OT Goals Patient Stated Goal: get better OT Goal Formulation: With patient/family Time For Goal Achievement: 06/06/24 Potential to Achieve Goals: Good ADL Goals Pt/caregiver will Perform Home Exercise Program: Right Upper extremity Additional ADL Goal #1: Pt will perform mod-max complexity dual tasking with mod I Additional ADL Goal #2: Pt will perform 5 step trailmaking task mod i  Plan      Co-evaluation                 AM-PAC OT 6 Clicks Daily Activity     Outcome Measure   Help from another person eating meals?: None Help from another person taking care of personal grooming?: A Little Help from another person toileting, which includes using toliet, bedpan, or urinal?: A Little Help from another person bathing (including washing, rinsing,  drying)?: A Little Help from another person to put on and taking off regular upper body clothing?: None Help from another person to put on and taking off regular lower body clothing?: A Little 6 Click Score: 20    End of Session    OT Visit Diagnosis: Unsteadiness on feet (R26.81);Muscle weakness (generalized) (M62.81);Other symptoms and signs involving cognitive function   Activity Tolerance Patient tolerated treatment well   Patient Left in bed;with call bell/phone within reach   Nurse Communication Mobility status        Time: 8784-8759 OT Time Calculation (min): 25 min  Charges: OT General Charges $OT Visit: 1 Visit OT Treatments $Therapeutic Activity: 8-22 mins $Cognitive Funtion inital: Initial 15 mins  Elma JONETTA Lebron FREDERICK, OTR/L [facility] Acute Rehabilitation Office: [phone]   Elma JONETTA Lebron 05/25/2024, 2:38 PM

## 2024-05-25 NOTE — Assessment & Plan Note (Addendum)
-   Continue Atorvastatin  40mg  + Ezetimibe  10mg   - F/u lipid panel outpatient

## 2024-05-25 NOTE — Progress Notes (Signed)
 STROKE TEAM PROGRESS NOTE   SIGNIFICANT HOSPITAL EVENTS 8/10: Patient presented with a 6-day history of confusion, accompanied by hesitancy and word-finding difficulty.  CT revealed a left ventral thalamic infarct.   SUBJECTIVE (INTERIM HISTORY) Patient is sitting upright in chair.  Patient is alert and in no acute distress.  T  Patient did well on the CPAP tolerability test last night and was randomized in the sleep small stroke prevention study to CPAP treatment. Neurological exam is stable.  No changes.  He still has mild left-sided paresthesias OBJECTIVE  CBC    Component Value Date/Time   WBC 8.3 05/22/2024 0837   RBC 4.64 05/22/2024 0837   HGB 13.1 05/22/2024 0837   HGB 11.3 (L) 03/21/2024 1037   HCT 41.4 05/22/2024 0837   HCT 36.2 (L) 03/21/2024 1037   PLT 360 05/22/2024 0837   PLT 338 03/21/2024 1037   MCV 89.2 05/22/2024 0837   MCV 93 03/21/2024 1037   MCH 28.2 05/22/2024 0837   MCHC 31.6 05/22/2024 0837   RDW 15.3 05/22/2024 0837   RDW 14.2 03/21/2024 1037   LYMPHSABS 1.9 05/22/2024 0837   MONOABS 0.7 05/22/2024 0837   EOSABS 0.4 05/22/2024 0837   BASOSABS 0.1 05/22/2024 0837    BMET    Component Value Date/Time   NA 141 05/25/2024 0350   NA 141 05/06/2024 1552   K 4.7 05/25/2024 0350   CL 108 05/25/2024 0350   CO2 22 05/25/2024 0350   GLUCOSE 122 (H) 05/25/2024 0350   BUN 61 (H) 05/25/2024 0350   BUN 52 (H) 05/06/2024 1552   CREATININE 4.67 (H) 05/25/2024 0350   CREATININE 1.47 (H) 11/07/2021 0903   CALCIUM  8.8 (L) 05/25/2024 0350   EGFR 18 (L) 05/06/2024 1552   GFRNONAA 14 (L) 05/25/2024 0350   GFRNONAA >89 05/02/2016 0925    IMAGING past 24 hours No results found.  Vitals:   05/25/24 0354 05/25/24 0751 05/25/24 1146 05/25/24 1152  BP: (!) 154/80 (!) 166/98 (!) 163/96 (!) 163/96  Pulse: 78 78 85 85  Resp: 18 18 20 18   Temp: 97.6 F (36.4 C) 98.2 F (36.8 C) 97.8 F (36.6 C) 97.8 F (36.6 C)  TempSrc:  Oral Oral Oral  SpO2: 100% 100% 100%  100%  Weight:      Height:         PHYSICAL EXAM General:  Alert, well-nourished, well-developed patient in no acute distress CV: Regular rate and rhythm on monitor Respiratory:  Regular, unlabored respirations on room air     NEURO:  Mental Status: AA&Ox3, patient is able to give clear and coherent history, memory has improved since admission Speech/Language: speech is without dysarthria or aphasia.  Naming, repetition, fluency, and comprehension intact. No hesitancy or difficulty word finding.    Cranial Nerves:  II: PERRL. Visual fields full.  III, IV, VI: EOMI. Eyelids elevate symmetrically.  V: Decreased sensation to light tough in all three division on the left VII: Face is symmetrical resting and smiling VIII: hearing intact to voice. IX, X: Palate elevates symmetrically. Phonation is normal.  KP:Dynloizm shrug 5/5. XII: tongue is midline without fasciculations. Motor: 5/5 strength to all muscle groups tested.  Tone: is normal and bulk is normal Sensation-Decreased light touch sensation involving L face, LUE, and LLE  Coordination: FTN intact bilaterally, HKS: no ataxia in BLE.No drift.  Gait- deferred   NIHSS 1   ASSESSMENT/PLAN Mr. Narek Kniss is a 53 y.o. male with history of CKD, HTN, DMII, Bipolar 1,  and ankylosing spondylitis, admitted for acute confusion that began Tuesday and markedly worsened Friday. NIH on Admission 5.  Memory function has markedly improved since admission; however, decree sensation persists on the left side of the face, arm, and leg.  After consultation with nephrology, the plan is to continue dual antiplatelet therapy for 3 weeks, then discontinue both aspirin  and Plavix  prior to her renal biopsy scheduled in 4 weeks.  Aspirin  alone will be resumed after the biopsy.  Discharge is planned for tomorrow.   Acute Ischemic Infarct:  ventral left thalamus Etiology:  Hypertension, Hyperlipidemia, Smoking history Code Stroke CT head No acute  abnormality. 2 separate remote lacunar infarcts within L thalamus. Subtle white matter hypoattenuation mildly advanced for age MRI  8 mm acute ischemic nonhemorrhagic infarct involving ventral left thalamus. Underlying age advanced chronic microvascular ischemic disease with scattered remote lacunar infarcts about L basal ganglia and L thalamus MRA  Negative for LVO, moderate long segment narrowing/stenosis of the right P2 segment. Focal mild-to-moderate stenosis involving the proximal dominant L V4 segment MRA neck Normal  2D Echo ejection fraction 60 to 65%.  No wall motion abnormalities.  Left atrial size normal. LDL 202 HgbA1c 7.4 VTE prophylaxis -Heparin  No antithrombotic prior to admission, now on aspirin  81 mg daily and clopidogrel  75 mg daily for 3 weeks and then aspirin  alone. Therapy recommendations:  Pending Disposition: 8/13     CKD, III 3b Renal Biopsy, scheduled for 8/28 outpatient.  Patient will discontinue DAPT prior to biopsy and continue aspirin  alone after biopsy Baseline Cr 2-2.5 Cr 4.71, GFR 14   Hypertension Home meds: Amlodipine  10 mg, Carvedilol  3.125, restart home medications Stable Blood Pressure Goal: SBP less than 160    Hyperlipidemia Home meds:  Rosuvastatin , not resumed  Add Atrovastatin 40 mg, Zetia  10 mg LDL 202, goal < 70 Continue statin at discharge   Diabetes type II Uncontrolled Diet controlled, nonadherent to metformin   HbA1c 7.4, previous HbA1c 8.4 (01/2023) HgbA1c goal < 7.0 CBGs SSI Recommend close follow-up with PCP for better DM control   Tobacco Abuse Former smoker, quit 1 month ago Previous 2.5 pack-year for 10 years, tobacco     Dysphagia Patient has post-stroke dysphagia, SLP consulted       Diet    Diet renal with fluid restriction Fluid restriction: 1200 mL Fluid; Room service appropriate? Yes; Fluid consistency: Thin        Advance diet as tolerated   Other Stroke Risk Factors Obesity, Body mass index is 42.22  kg/m., BMI >/= 30 associated with increased stroke risk, recommend weight loss, diet and exercise as appropriate  Family hx stroke (Maternal grandmother) Coronary artery disease     Other Active Problems Ankylosis spondylitis Bipolar 1 New diagnosis of obstructive sleep apnea  Hospital day #2  Continue aspirin  Plavix  for 3 weeks followed by aspirin  alone and aggressive risk factor modification.  Patient has been randomized to CPAP treatment down in the sleep smart stroke prevention study.  Follow-up as an outpatient with stroke clinic in 2 to 3 months.  I personally spent a total of  in the care of the patient today including getting/reviewing separately obtained history, performing a medically appropriate exam/evaluation, counseling and educating, placing orders, referring and communicating with other health care professionals, documenting clinical information in the EHR, independently interpreting results, and coordinating care.            Eather Popp, MD Medical Director [facility] Pager: [phone] 05/25/2024 4:15 PM  To contact Stroke Continuity provider, please refer to [email or web address]. After hours, contact General Neurology

## 2024-05-25 NOTE — Discharge Instructions (Addendum)
 Dear Edward [name],   Thank you for letting us  participate in your care! You were admitted to the hospital for a stroke and suspected nephrotic syndrome. Please continue taking Plavix  for the next [redacted] weeks along with Aspirin . Please also follow up with your PCP and the kidney doctors as scheduled.    POST-HOSPITAL & CARE INSTRUCTIONS We recommend following up with your PCP within 1 week from being discharged from the hospital. Please let PCP/Specialists know of any changes in medications that were made which you will be able to see in the medications section of this packet.  DOCTOR'S APPOINTMENTS & FOLLOW UP Future Appointments  Date Time Provider Department Center  05/30/2024  8:50 AM Tharon Lung, MD [facility]  06/09/2024  8:00 AM [facility]  2 [facility]  [facility]     Thank you for choosing [facility]! Take care and be well!  Family Medicine Teaching Service Inpatient Team [place]  [facility]  [address] [place], [place] [address] [phone]

## 2024-05-25 NOTE — TOC Transition Note (Signed)
 Transition of Care [facility]) - Discharge Note   Patient Details  Name: Edward [name] MRN: [id number] Date of Birth: [birth year]  Transition of Care [facility]) CM/SW Contact:  Andrez JULIANNA George, RN Phone Number: 05/25/2024, 1:58 PM   Clinical Narrative:     Pt is discharging home with outpatient therapy through [facility]. Information is on the AVS. Pt will call to schedule the first appointment. No DME needs.  Pt has supervision at home and transportation to home.  Final next level of care: OP Rehab Barriers to Discharge: No Barriers Identified   Patient Goals and CMS Choice   CMS Medicare.gov Compare Post Acute Care list provided to:: Patient Choice offered to / list presented to : Patient (SO)      Discharge Placement                       Discharge Plan and Services Additional resources added to the After Visit Summary for     Discharge Planning Services: CM Consult                                 Social Drivers of Health (SDOH) Interventions SDOH Screenings   Food Insecurity: No Food Insecurity (05/23/2024)  Housing: Low Risk  (05/23/2024)  Transportation Needs: No Transportation Needs (05/23/2024)  Utilities: Not At Risk (05/23/2024)  Alcohol Screen: Low Risk  (03/20/2024)  Depression (PHQ2-9): Medium Risk (03/21/2024)  Financial Resource Strain: Patient Declined (03/20/2024)  Physical Activity: Insufficiently Active (03/20/2024)  Social Connections: Socially Isolated (03/20/2024)  Stress: Stress Concern Present (03/20/2024)  Tobacco Use: Medium Risk (05/22/2024)     Readmission Risk Interventions     No data to display

## 2024-05-25 NOTE — Progress Notes (Signed)
 Pt ready for DC. All lines removed. DC instructions reviewed. Vitals stable.

## 2024-05-25 NOTE — Assessment & Plan Note (Addendum)
 History of worsening renal function w/ nephrotic studies in past year with increasing creatinine. Stable Cr in the 4.7s. Euvolemic. - Nephrology:  - Dr. Melia will follow up with him outpatient for biopsy in 4 weeks and protein lowering therapy - Hold IVF, continue PO - Renal function panel daily

## 2024-05-25 NOTE — Assessment & Plan Note (Addendum)
 Type 2 diabetes mellitus: Diet controlled  - A1C 7.4  - Continue SSI inpatient - Restart on ozempic  outpatient bc GFR is 14, was non-adherent prior due to changing states, willing to go back on it  Hypertension: - Continue on amlodipine  and carvedilol  since no longer permissive HTN  Ankylosing Spondylitis: - Discontinue home tramadol  and tizanidine  due to kidney function - Continue oxycodone  2.5 q6 PRN  Bipolar 1: - Not currently on any medication, previously on lithium  d/c 4/24

## 2024-05-26 ENCOUNTER — Telehealth: Payer: Self-pay

## 2024-05-26 NOTE — Transitions of Care (Post Inpatient/ED Visit) (Signed)
 05/26/2024  Name: Edward [name] MRN: [id number] DOB: 1971-06-21  Today's TOC FU Call Status: Today's TOC FU Call Status:: Successful TOC FU Call Completed TOC FU Call Complete Date: 05/26/24 Patient's Name and Date of Birth confirmed.  Transition Care Management Follow-up Telephone Call Date of Discharge: 05/25/24 Discharge Facility: [facility]) Type of Discharge: Inpatient Admission Primary Inpatient Discharge Diagnosis:: cerebral infarction How have you been since you were released from the hospital?: Better Any questions or concerns?: No  Items Reviewed: Did you receive and understand the discharge instructions provided?: Yes Medications obtained,verified, and reconciled?: Yes (Medications Reviewed) Any new allergies since your discharge?: No Dietary orders reviewed?: Yes Do you have support at home?: Yes People in Home [RPT]: child(ren), adult  Medications Reviewed Today: Medications Reviewed Today     Reviewed by Emmitt Pan, LPN (Licensed Practical Nurse) on 05/26/24 at [phone]  Med List Status: <None>   Medication Order Taking? Sig Documenting Provider Last Dose Status Informant  amLODipine  (NORVASC ) 10 MG tablet [id number] Yes Take 1 tablet (10 mg total) by mouth daily. Myrna Bitters, DO  Active Self, Spouse/Significant Other, Pharmacy Records  aspirin  EC 81 MG tablet [id number] Yes Take 1 tablet (81 mg total) by mouth daily. Swallow whole. Everhart, Kirstie, DO  Active   atorvastatin  (LIPITOR) 40 MG tablet [id number] Yes Take 1 tablet (40 mg total) by mouth daily. Everhart, Kirstie, DO  Active   b complex vitamins capsule [id number] Yes Take 1 capsule by mouth daily. [provider]  Active Self, Spouse/Significant Other, Pharmacy Records  carvedilol  (COREG ) 3.125 MG tablet [id number] Yes Take 3.125 mg by mouth 2 (two) times daily. [provider]  Active Self, Spouse/Significant Other, Pharmacy Records  clopidogrel  (PLAVIX ) 75 MG tablet [id number] Yes  Take 1 tablet (75 mg total) by mouth daily for 19 days. Everhart, Kirstie, DO  Active   Continuous Blood Gluc Receiver (DEXCOM G7 [place]) DEVI [id number] Yes Use to monitor blood sugar Hensel, Elsie LABOR, MD  Active Self, Spouse/Significant Other, Pharmacy Records  Continuous Blood Gluc Sensor (DEXCOM G7 SENSOR) MISC [id number]  Use to monitor blood sugar  Patient not taking: Reported on 05/26/2024   Donzetta Rollene BRAVO, MD  Active Self, Spouse/Significant Other, Pharmacy Records  ezetimibe  (ZETIA ) 10 MG tablet [id number] Yes Take 1 tablet (10 mg total) by mouth daily. Everhart, Kirstie, DO  Active   ferrous sulfate  325 (65 FE) MG tablet [id number] Yes Take 1 tablet (325 mg total) by mouth daily with breakfast. Pray, Rollene BRAVO, MD  Active Self, Spouse/Significant Other, Pharmacy Records  magnesium (MAGTAB) 84 MG ( ) TBCR SR tablet [id number] Yes Take 250 mg by mouth daily. [provider]  Active Self, Spouse/Significant Other, Pharmacy Records  Multiple Vitamin (MULTIVITAMIN) tablet [id number]  Take 1 tablet by mouth daily.  Patient not taking: Reported on 05/26/2024   [provider]  Active Self, Spouse/Significant Other, Pharmacy Records  oxyCODONE  (OXY IR/ROXICODONE ) 5 MG immediate release tablet [id number] Yes Take 0.5 tablets (2.5 mg total) by mouth every 6 (six) hours as needed for severe pain (pain score 7-10) or moderate pain (pain score 4-6). Everhart, Kirstie, DO  Active   pantoprazole  (PROTONIX ) 40 MG tablet [id number] Yes Take 1 tablet (40 mg total) by mouth daily. Everhart, Kirstie, DO  Active   zinc gluconate 50 MG tablet [id number] Yes Take 50 mg by mouth daily. [provider]  Active Self, Spouse/Significant Other, Pharmacy Records            Home Care and  Equipment/Supplies: Were Home Health Services Ordered?: Yes Name of Home Health Agency:: unknown Has Agency set up a time to come to your home?: No Any new equipment or medical supplies ordered?:  NA  Functional Questionnaire: Do you need assistance with bathing/showering or dressing?: No Do you need assistance with meal preparation?: No Do you need assistance with eating?: No Do you have difficulty maintaining continence: No Do you need assistance with getting out of bed/getting out of a chair/moving?: No Do you have difficulty managing or taking your medications?: No  Follow up appointments reviewed: PCP Follow-up appointment confirmed?: Yes Date of PCP follow-up appointment?: 05/30/24 Follow-up Provider: [facility] Follow-up appointment confirmed?: No Reason Specialist Follow-Up Not Confirmed: Patient has Specialist Provider Number and will Call for Appointment Do you need transportation to your follow-up appointment?: No Do you understand care options if your condition(s) worsen?: Yes-patient verbalized understanding    SIGNATURE Julian Lemmings, LPN [facility] Nurse Health Advisor Direct Dial [phone]

## 2024-05-30 ENCOUNTER — Inpatient Hospital Stay: Payer: Self-pay | Admitting: Family Medicine

## 2024-06-03 ENCOUNTER — Encounter: Payer: Self-pay | Admitting: Family Medicine

## 2024-06-03 ENCOUNTER — Ambulatory Visit (INDEPENDENT_AMBULATORY_CARE_PROVIDER_SITE_OTHER): Payer: PRIVATE HEALTH INSURANCE | Admitting: Family Medicine

## 2024-06-03 VITALS — BP 122/78 | HR 78 | Ht 71.0 in | Wt 276.0 lb

## 2024-06-03 DIAGNOSIS — I639 Cerebral infarction, unspecified: Secondary | ICD-10-CM

## 2024-06-03 DIAGNOSIS — E669 Obesity, unspecified: Secondary | ICD-10-CM

## 2024-06-03 DIAGNOSIS — E1169 Type 2 diabetes mellitus with other specified complication: Secondary | ICD-10-CM

## 2024-06-03 DIAGNOSIS — F3162 Bipolar disorder, current episode mixed, moderate: Secondary | ICD-10-CM | POA: Diagnosis not present

## 2024-06-03 DIAGNOSIS — M450 Ankylosing spondylitis of multiple sites in spine: Secondary | ICD-10-CM

## 2024-06-03 MED ORDER — QUETIAPINE FUMARATE 25 MG PO TABS: ORAL_TABLET | ORAL | 0 refills | Status: DC

## 2024-06-03 MED ORDER — OXYCODONE HCL 5 MG PO TABS: 5.0000 mg | ORAL_TABLET | Freq: Four times a day (QID) | ORAL | 0 refills | Status: DC | PRN

## 2024-06-03 MED ORDER — ACCU-CHEK GUIDE W/DEVICE KIT: PACK | 0 refills | Status: DC

## 2024-06-03 MED ORDER — DEXCOM G7 RECEIVER DEVI: 0 refills | Status: DC

## 2024-06-03 MED ORDER — ACCU-CHEK SOFTCLIX LANCET DEV KIT: PACK | 0 refills | Status: DC

## 2024-06-03 MED ORDER — ACCU-CHEK SOFTCLIX LANCETS MISC: 3 refills | Status: DC

## 2024-06-03 NOTE — Patient Instructions (Addendum)
 It was wonderful to see you today.  Please bring ALL of your medications with you to every visit.   Today we talked about:  - I have sent in oxycodone  5mg  to take every 6-8 hours as needed for pain - You can continue to take tylenol  650 mg three times a day as needed for pain  - I have sent in glucometer and Dexcom sensors for your blood sugar  I will talk to Dr Melia about Pat.   Please wear your CPAP at night.   Thank you for choosing [facility].   Please call [phone] with any questions about today's appointment.  Please arrive at least 15 minutes prior to your scheduled appointments.   If you had blood work today, I will send you a MyChart message or a letter if results are normal. Otherwise, I will give you a call.   If you had a referral placed, they will call you to set up an appointment. Please give us  a call if you don't hear back in the next 2 weeks.   If you need additional refills before your next appointment, please call your pharmacy first.  Don't forget to check out the [facility] in the [facility] at [address] [phone] Affordable prices on prescriptions and over-the-counter items, as well as services like vaccinations and medication home delivery.   Rollene Keeling, MD  Family Medicine

## 2024-06-03 NOTE — Progress Notes (Signed)
 SUBJECTIVE:   CHIEF COMPLAINT / HPI:   Discussed the use of AI scribe software for clinical note transcription with the patient, who gave verbal consent to proceed.  History of Present Illness Edward [name] is a 53 year old male with chronic pain and ankylosing spondylitis who presents with severe pain management issues.  Chronic pain and ankylosing spondylitis - Severe pain localized to lower back (L5 and below) and left shoulder, radiates through shoulder with movement - Pain severity increased following recent hospitalization - Current pain regimen includes oxycodone  (2.5mg  per dose), which provides minimal relief, allowing him to sit up but not significantly reducing pain - Tramadol  was previously effective but is no longer used - NSAIDs are avoided due to kidney dysfunction and prior gastrointestinal bleeding - Acetaminophen  500 mg taken as needed, with doses timed on his phone - Stool softener used to manage opioid-induced constipation - He has concern regarding potential abuse and side effects of opioid medications - is not interested in going back to rheumatology to consider biologics  Sleep disturbance - Severe sleep disruption attributed to pain, described as 'horrible' and 'not sleeping at all' - CPAP machine recently initiated but is uncomfortable - Fiance observes 'monster pain' at night, further impacting sleep - Anxiety exacerbates sleep difficulties  Gastrointestinal bleeding and ulcer disease - History of gastrointestinal bleeding, previously exacerbated by ibuprofen use - Ulcer bleed occurred in the past - NSAIDs are avoided due to risk of recurrent bleeding and kidney concerns  Chronic kidney disease - Chronic kidney disease with recent worsening, saw Dr Melia earlier this week with nephrology and planning for kidney biopsy, and planning to see vascular surgery to get AV fistula placed anticipating initiation of hemodialysis - Awaiting kidney biopsy - Avoids  magnesium supplements due to renal dysfunction - also having muscle aches which he attributes to kidney dysfunction  HTN - Current medications amlodpine and Coreg , notices good BP reading at home - Denies chest pain, palpitations, shortness of breath  Anxiety - Anxiety worsened by recent life events, including divorce and relocation - Previously managed with clonazepam , not currently taking this medication - Anxiety contributes to sleep disturbance  Cerebrovascular accident (stroke) sequelae - Recent stroke with residual decreased sensation on right side of body - Single episode of colorful spots in right eye lasting approximately 30 seconds since stroke - Memory impairment present - No recent falls - Currently living with fiance, who assists with care - Taking aspirin  and Plavix      PERTINENT  PMH / PSH: ankylosing spondylitis with chronic pain, anxiety/bipolar disorder, recent CVA, nephrotic syndrome CKD stage 5, following with nephrology,   OBJECTIVE:   BP 122/78   Pulse 78   Ht 5' 11 (1.803 m)   Wt 276 lb (125.2 kg)   SpO2 98%   BMI 38.49 kg/m   Physical Exam General: A&O, NAD HEENT: No sign of trauma, EOM grossly intact Cardiac: RRR, no m/r/g, no carotid bruit bilaterally Respiratory: CTAB, normal WOB, no w/c/r GI: Soft, NTTP, non-distended  Extremities: NTTP, no peripheral edema. Left shoulder pain with limited range of motion. Tenderness in lleft thoracic paraspinal region Neuro: Normal gait, strength 5/5 in bilateral upper and lower extremities. CN II-XII intact except for decreased sensation on right side of face (pt notes present since CVA). Decreased sensation to light touch in RUE and RLE as well (he notes present since CVA). NO dysarthria.  Psych: Appropriate mood and affect   ASSESSMENT/PLAN:   Assessment & Plan Chronic pain due  to ankylosing spondylitis Pain severe, not controlled with current regimen. Oxycodone  ineffective, tramadol  not preferred.  Biologics/rheumatology referral declined. - Increase oxycodone  to 5 mg every 6 hours as needed for pain. - Consider referral to rheumatologist for potential injections if pain persists. - Continue acetaminophen  up to 650 mg three times a day.  Chronic kidney disease Progressive disease. Discussed potential dialysis. NSAIDs avoided. - Discuss potential dialysis and AV fistula preparation with vascular surgery - discontinued magnesium supplement  Type 2 diabetes mellitus A1c 7.4, fairly controlled. Discussed continuous glucose monitoring. - Provide continuous glucose monitor. - Send in a physical glucometer. - Discuss with nephrologist about potential use of Mounjaro for weight management.  Recent ischemic stroke with residual right-sided sensory deficit Residual right-sided sensory deficit. Brief vision changes noted. - Schedule follow-up eye exam with ophtho, referral placed.  Insomnia due to chronic pain and anxiety Insomnia due to pain and anxiety. CPAP not used. Seroquel  considered. - Start Seroquel  25 mg titrate up to 50 mg at night for sleep. - Encourage use of CPAP. - Monitor sleep quality and adjust treatment as needed.  Generalized anxiety disorder Anxiety contributing to insomnia, exacerbated by stressors. - Start Seroquel  25 mg with titrate up to 50 mg at night. - Monitor anxiety symptoms and adjust treatment as needed. - with history of bipolar disorder, avoid SSRI, psychiatry referral previously placed  Constipation due to opioid therapy Constipation secondary to opioids. Colace used, Miralax recommended. - Start Miralax daily. - Monitor bowel movements and adjust treatment as needed.  Hypertension, controlled Hypertension controlled. - Continue current antihypertensive regimen. - Monitor blood pressure regularly.  Hyperlipidemia, on therapy Managed with atorvastatin . Muscle aches noted, possibly related. - Continue atorvastatin .  History of gastrointestinal  bleeding due to peptic ulcer GI bleeding history. Protonix  continued for protection. - Continue Protonix . - Monitor for signs of GI bleeding.  Obstructive sleep apnea, not using CPAP CPAP prescribed but not used due to discomfort. - Encourage use of CPAP. - Monitor sleep quality and adjust treatment as needed.     Total time: 62 min spent in direct patient care  Rollene FORBES Keeling, MD [facility]

## 2024-06-06 ENCOUNTER — Encounter: Payer: Self-pay | Admitting: Family Medicine

## 2024-06-08 ENCOUNTER — Encounter: Payer: Self-pay | Admitting: Gastroenterology

## 2024-06-08 ENCOUNTER — Encounter: Payer: Self-pay | Admitting: Family Medicine

## 2024-06-08 ENCOUNTER — Other Ambulatory Visit: Payer: Self-pay | Admitting: Family Medicine

## 2024-06-08 DIAGNOSIS — Z8719 Personal history of other diseases of the digestive system: Secondary | ICD-10-CM

## 2024-06-09 ENCOUNTER — Encounter: Payer: Self-pay | Admitting: Family Medicine

## 2024-06-09 ENCOUNTER — Ambulatory Visit (HOSPITAL_COMMUNITY): Admission: RE | Admit: 2024-06-09 | Payer: PRIVATE HEALTH INSURANCE | Source: Ambulatory Visit

## 2024-06-09 ENCOUNTER — Encounter (HOSPITAL_COMMUNITY): Payer: Self-pay

## 2024-06-10 ENCOUNTER — Encounter: Payer: Self-pay | Admitting: Family Medicine

## 2024-06-15 ENCOUNTER — Inpatient Hospital Stay (HOSPITAL_COMMUNITY)
Admission: EM | Admit: 2024-06-15 | Discharge: 2024-07-02 | DRG: 981 | Disposition: A | Discharge status: Home Health | Payer: PRIVATE HEALTH INSURANCE | Attending: Family Medicine | Admitting: Family Medicine

## 2024-06-15 ENCOUNTER — Emergency Department (HOSPITAL_COMMUNITY): Payer: PRIVATE HEALTH INSURANCE

## 2024-06-15 ENCOUNTER — Other Ambulatory Visit: Payer: Self-pay

## 2024-06-15 DIAGNOSIS — Z8673 Personal history of transient ischemic attack (TIA), and cerebral infarction without residual deficits: Secondary | ICD-10-CM

## 2024-06-15 DIAGNOSIS — E875 Hyperkalemia: Secondary | ICD-10-CM | POA: Diagnosis present

## 2024-06-15 DIAGNOSIS — Z6836 Body mass index (BMI) 36.0-36.9, adult: Secondary | ICD-10-CM

## 2024-06-15 DIAGNOSIS — T380X5A Adverse effect of glucocorticoids and synthetic analogues, initial encounter: Secondary | ICD-10-CM | POA: Diagnosis present

## 2024-06-15 DIAGNOSIS — Z811 Family history of alcohol abuse and dependence: Secondary | ICD-10-CM

## 2024-06-15 DIAGNOSIS — F419 Anxiety disorder, unspecified: Secondary | ICD-10-CM | POA: Diagnosis present

## 2024-06-15 DIAGNOSIS — Z8349 Family history of other endocrine, nutritional and metabolic diseases: Secondary | ICD-10-CM

## 2024-06-15 DIAGNOSIS — S3722XA Contusion of bladder, initial encounter: Secondary | ICD-10-CM

## 2024-06-15 DIAGNOSIS — R339 Retention of urine, unspecified: Secondary | ICD-10-CM | POA: Insufficient documentation

## 2024-06-15 DIAGNOSIS — Z8249 Family history of ischemic heart disease and other diseases of the circulatory system: Secondary | ICD-10-CM

## 2024-06-15 DIAGNOSIS — F3162 Bipolar disorder, current episode mixed, moderate: Secondary | ICD-10-CM

## 2024-06-15 DIAGNOSIS — R1031 Right lower quadrant pain: Secondary | ICD-10-CM

## 2024-06-15 DIAGNOSIS — R338 Other retention of urine: Secondary | ICD-10-CM | POA: Diagnosis not present

## 2024-06-15 DIAGNOSIS — S37012A Minor contusion of left kidney, initial encounter: Secondary | ICD-10-CM | POA: Diagnosis present

## 2024-06-15 DIAGNOSIS — M461 Sacroiliitis, not elsewhere classified: Secondary | ICD-10-CM | POA: Diagnosis present

## 2024-06-15 DIAGNOSIS — T402X5A Adverse effect of other opioids, initial encounter: Secondary | ICD-10-CM | POA: Diagnosis present

## 2024-06-15 DIAGNOSIS — R1030 Lower abdominal pain, unspecified: Secondary | ICD-10-CM

## 2024-06-15 DIAGNOSIS — S37019A Minor contusion of unspecified kidney, initial encounter: Secondary | ICD-10-CM

## 2024-06-15 DIAGNOSIS — F429 Obsessive-compulsive disorder, unspecified: Secondary | ICD-10-CM | POA: Diagnosis present

## 2024-06-15 DIAGNOSIS — G4733 Obstructive sleep apnea (adult) (pediatric): Secondary | ICD-10-CM | POA: Diagnosis present

## 2024-06-15 DIAGNOSIS — M456 Ankylosing spondylitis lumbar region: Principal | ICD-10-CM | POA: Diagnosis present

## 2024-06-15 DIAGNOSIS — N320 Bladder-neck obstruction: Secondary | ICD-10-CM | POA: Diagnosis present

## 2024-06-15 DIAGNOSIS — D631 Anemia in chronic kidney disease: Secondary | ICD-10-CM | POA: Diagnosis present

## 2024-06-15 DIAGNOSIS — Z823 Family history of stroke: Secondary | ICD-10-CM

## 2024-06-15 DIAGNOSIS — N99840 Postprocedural hematoma of a genitourinary system organ or structure following a genitourinary system procedure: Secondary | ICD-10-CM | POA: Diagnosis not present

## 2024-06-15 DIAGNOSIS — G8929 Other chronic pain: Secondary | ICD-10-CM | POA: Diagnosis present

## 2024-06-15 DIAGNOSIS — Z992 Dependence on renal dialysis: Secondary | ICD-10-CM

## 2024-06-15 DIAGNOSIS — E669 Obesity, unspecified: Secondary | ICD-10-CM | POA: Diagnosis present

## 2024-06-15 DIAGNOSIS — Z809 Family history of malignant neoplasm, unspecified: Secondary | ICD-10-CM

## 2024-06-15 DIAGNOSIS — F319 Bipolar disorder, unspecified: Secondary | ICD-10-CM | POA: Diagnosis present

## 2024-06-15 DIAGNOSIS — E1122 Type 2 diabetes mellitus with diabetic chronic kidney disease: Secondary | ICD-10-CM | POA: Diagnosis present

## 2024-06-15 DIAGNOSIS — Z79899 Other long term (current) drug therapy: Secondary | ICD-10-CM

## 2024-06-15 DIAGNOSIS — E872 Acidosis, unspecified: Secondary | ICD-10-CM | POA: Diagnosis present

## 2024-06-15 DIAGNOSIS — N186 End stage renal disease: Secondary | ICD-10-CM | POA: Diagnosis present

## 2024-06-15 DIAGNOSIS — M450 Ankylosing spondylitis of multiple sites in spine: Secondary | ICD-10-CM

## 2024-06-15 DIAGNOSIS — I120 Hypertensive chronic kidney disease with stage 5 chronic kidney disease or end stage renal disease: Secondary | ICD-10-CM | POA: Diagnosis present

## 2024-06-15 DIAGNOSIS — N179 Acute kidney failure, unspecified: Principal | ICD-10-CM | POA: Diagnosis present

## 2024-06-15 DIAGNOSIS — M459 Ankylosing spondylitis of unspecified sites in spine: Secondary | ICD-10-CM | POA: Diagnosis not present

## 2024-06-15 DIAGNOSIS — M5126 Other intervertebral disc displacement, lumbar region: Secondary | ICD-10-CM | POA: Diagnosis present

## 2024-06-15 DIAGNOSIS — Z789 Other specified health status: Secondary | ICD-10-CM

## 2024-06-15 DIAGNOSIS — N185 Chronic kidney disease, stage 5: Secondary | ICD-10-CM | POA: Diagnosis present

## 2024-06-15 DIAGNOSIS — K5903 Drug induced constipation: Secondary | ICD-10-CM | POA: Diagnosis present

## 2024-06-15 DIAGNOSIS — Z7682 Awaiting organ transplant status: Secondary | ICD-10-CM

## 2024-06-15 DIAGNOSIS — Z7982 Long term (current) use of aspirin: Secondary | ICD-10-CM

## 2024-06-15 DIAGNOSIS — Z833 Family history of diabetes mellitus: Secondary | ICD-10-CM

## 2024-06-15 DIAGNOSIS — Z885 Allergy status to narcotic agent status: Secondary | ICD-10-CM

## 2024-06-15 DIAGNOSIS — F840 Autistic disorder: Secondary | ICD-10-CM | POA: Diagnosis present

## 2024-06-15 DIAGNOSIS — Z803 Family history of malignant neoplasm of breast: Secondary | ICD-10-CM

## 2024-06-15 DIAGNOSIS — M549 Dorsalgia, unspecified: Secondary | ICD-10-CM | POA: Diagnosis present

## 2024-06-15 DIAGNOSIS — E785 Hyperlipidemia, unspecified: Secondary | ICD-10-CM | POA: Diagnosis present

## 2024-06-15 DIAGNOSIS — Z83438 Family history of other disorder of lipoprotein metabolism and other lipidemia: Secondary | ICD-10-CM

## 2024-06-15 DIAGNOSIS — Z9884 Bariatric surgery status: Secondary | ICD-10-CM

## 2024-06-15 DIAGNOSIS — R531 Weakness: Secondary | ICD-10-CM

## 2024-06-15 DIAGNOSIS — K5909 Other constipation: Secondary | ICD-10-CM | POA: Diagnosis present

## 2024-06-15 DIAGNOSIS — N3289 Other specified disorders of bladder: Secondary | ICD-10-CM | POA: Diagnosis not present

## 2024-06-15 DIAGNOSIS — Z87891 Personal history of nicotine dependence: Secondary | ICD-10-CM

## 2024-06-15 DIAGNOSIS — Y848 Other medical procedures as the cause of abnormal reaction of the patient, or of later complication, without mention of misadventure at the time of the procedure: Secondary | ICD-10-CM | POA: Diagnosis not present

## 2024-06-15 DIAGNOSIS — E1165 Type 2 diabetes mellitus with hyperglycemia: Secondary | ICD-10-CM | POA: Diagnosis present

## 2024-06-15 DIAGNOSIS — Z886 Allergy status to analgesic agent status: Secondary | ICD-10-CM

## 2024-06-15 DIAGNOSIS — K5900 Constipation, unspecified: Secondary | ICD-10-CM | POA: Diagnosis present

## 2024-06-15 DIAGNOSIS — R310 Gross hematuria: Secondary | ICD-10-CM | POA: Diagnosis not present

## 2024-06-15 DIAGNOSIS — I69398 Other sequelae of cerebral infarction: Secondary | ICD-10-CM

## 2024-06-15 DIAGNOSIS — D62 Acute posthemorrhagic anemia: Secondary | ICD-10-CM | POA: Diagnosis not present

## 2024-06-15 HISTORY — DX: Minor contusion of right kidney, initial encounter: S37.011A

## 2024-06-15 LAB — COMPREHENSIVE METABOLIC PANEL WITH GFR
ALT: 10 U/L (ref 0–44)
AST: 19 U/L (ref 15–41)
Albumin: 2.3 g/dL — ABNORMAL LOW (ref 3.5–5.0)
Alkaline Phosphatase: 111 U/L (ref 38–126)
Anion gap: 13 (ref 5–15)
BUN: 84 mg/dL — ABNORMAL HIGH (ref 6–20)
CO2: 16 mmol/L — ABNORMAL LOW (ref 22–32)
Calcium: 9 mg/dL (ref 8.9–10.3)
Chloride: 106 mmol/L (ref 98–111)
Creatinine, Ser: 5.38 mg/dL — ABNORMAL HIGH (ref 0.61–1.24)
GFR, Estimated: 12 mL/min — ABNORMAL LOW (ref >60)
Glucose, Bld: 141 mg/dL — ABNORMAL HIGH (ref 70–99)
Potassium: 5 mmol/L (ref 3.5–5.1)
Sodium: 135 mmol/L (ref 135–145)
Total Bilirubin: 0.6 mg/dL (ref 0.0–1.2)
Total Protein: 7.3 g/dL (ref 6.5–8.1)

## 2024-06-15 LAB — CBC WITH DIFFERENTIAL/PLATELET
Abs Immature Granulocytes: 0.13 K/uL — ABNORMAL HIGH (ref 0.00–0.07)
Basophils Absolute: 0.1 K/uL (ref 0.0–0.1)
Basophils Relative: 1 %
Eosinophils Absolute: 0.1 K/uL (ref 0.0–0.5)
Eosinophils Relative: 0 %
HCT: 36.2 % — ABNORMAL LOW (ref 39.0–52.0)
Hemoglobin: 11.2 g/dL — ABNORMAL LOW (ref 13.0–17.0)
Immature Granulocytes: 1 %
Lymphocytes Relative: 10 %
Lymphs Abs: 1.4 K/uL (ref 0.7–4.0)
MCH: 28.3 pg (ref 26.0–34.0)
MCHC: 30.9 g/dL (ref 30.0–36.0)
MCV: 91.4 fL (ref 80.0–100.0)
Monocytes Absolute: 1.3 K/uL — ABNORMAL HIGH (ref 0.1–1.0)
Monocytes Relative: 9 %
Neutro Abs: 11.7 K/uL — ABNORMAL HIGH (ref 1.7–7.7)
Neutrophils Relative %: 79 %
Platelets: 469 K/uL — ABNORMAL HIGH (ref 150–400)
RBC: 3.96 MIL/uL — ABNORMAL LOW (ref 4.22–5.81)
RDW: 15.8 % — ABNORMAL HIGH (ref 11.5–15.5)
WBC: 14.7 K/uL — ABNORMAL HIGH (ref 4.0–10.5)
nRBC: 0 % (ref 0.0–0.2)

## 2024-06-15 LAB — URINALYSIS, ROUTINE W REFLEX MICROSCOPIC
Bilirubin Urine: NEGATIVE
Glucose, UA: 50 mg/dL — AB
Ketones, ur: NEGATIVE mg/dL
Leukocytes,Ua: NEGATIVE
Nitrite: NEGATIVE
Protein, ur: >=300 mg/dL — AB
Specific Gravity, Urine: 1.014 (ref 1.005–1.030)
pH: 5 (ref 5.0–8.0)

## 2024-06-15 LAB — PROTIME-INR
INR: 1.2 (ref 0.8–1.2)
Prothrombin Time: 16.1 s — ABNORMAL HIGH (ref 11.4–15.2)

## 2024-06-15 LAB — CK: Total CK: 53 U/L (ref 49–397)

## 2024-06-15 LAB — APTT: aPTT: 40 s — ABNORMAL HIGH (ref 24–36)

## 2024-06-15 MED ORDER — DEXAMETHASONE SODIUM PHOSPHATE 10 MG/ML IJ SOLN
10.0000 mg | Freq: Once | INTRAMUSCULAR | Status: AC
  Administered 2024-06-16: 10 mg via INTRAVENOUS
  Filled 2024-06-15: qty 1

## 2024-06-15 MED ORDER — HYDROMORPHONE HCL 1 MG/ML IJ SOLN
0.5000 mg | Freq: Once | INTRAMUSCULAR | Status: AC
  Administered 2024-06-15: 0.5 mg via INTRAVENOUS
  Filled 2024-06-15: qty 1

## 2024-06-15 NOTE — ED Triage Notes (Signed)
 Pt bib PTAR from home. Complains of general weakness. Pt has been progressively getting too weak to walk. Around 0100 was the last time the pt states he was able to walk. 8/10 right groin pain that radiates to lower back. Hx of ankylosing spondylitis.

## 2024-06-15 NOTE — ED Provider Notes (Incomplete)
**Note Edward-Identified via Obfuscation**  Edward [name] Provider Note  CSN: [id number] Arrival date & time: 06/15/24 0932  Chief Complaint(s) Weakness  HPI Edward [name] is a 53 y.o. male year-old male with a complex history including stage 5 chronic kidney disease (pending dialysis, on kidney transplant list), ankylosing spondylitis 40 years, and a recent ischemic stroke 4 weeks ago (right-sided facial and upper arm weakness, transient, now resolved, with some persistent short-term memory issues).  He presents today with severe, worsening musculoskeletal pain (diffuse, especially in the neck and back, also in joints) that has progressively worsened over several weeks, but became acutely debilitating overnight (around 1 a.m.), leaving him unable to stand or walk. Pain medications at home have been ineffective. He denies new focal weakness, vision changes, chest pain, shortness of breath, or headache. He describes the problem as severe generalized pain rather than weakness.  Additional history:  CKD: Reports minimal urine output; urine light yellow/white. Bowel: Constipation likely due to chronic pain medications; last BM Saturday, on Miralax  without relief.  Musculoskeletal: Chronic back pain from ankylosing spondylitis, worsening neck pain. Right-sided pain predominates (back and leg). Mild ankle pain, no acute swelling.  Stroke history: Residual mild short-term memory deficit, otherwise improved. Exam (from dialogue): General: Unable to ambulate due to pain. Neuro: No new focal deficits on interview; denies current weakness. Abdomen: Constipation noted, no acute bleeding. MSK: Diffuse pain, especially neck, back, and right-sided body. CV/Resp: No chest pain, no SOB reported. Extremities: No acute swelling observed, history of prior fluid overload.  MDM:  #Severe generalized musculoskeletal pain / inability to ambulate Most likely progression/exacerbation of ankylosing spondylitis  vs secondary complications (vertebral fracture, spinal cord compression, or nerve impingement).  Consider pain crisis vs metabolic disturbance (given ESRD).  No acute stroke features on history.  #Stage 5 CKD (ESRD)  Not yet on dialysis, limited urine output, chronic fluid overload history. Risk for electrolyte imbalance, uremia contributing to fatigue, weakness, pain, and constipation.  #History of ischemic stroke No new focal weakness, vision changes, or speech deficits. Low suspicion for recurrent stroke at this time.  #Constipation Likely opioid-induced; persistent despite daily Miralax .  Plan:  Vitals, physical exam, full neuro exam. Labs: CBC, CMP, electrolytes, ESR/CRP, troponin, CK, urinalysis. Imaging: MRI or CT spine if concern for spinal cord compression/acute neurologic process; X-ray if fracture suspected. Renal panel: assess need for urgent dialysis. Pain management: multimodal (avoid NSAIDs given ESRD). Consider nephrology and rheumatology consults. Admission likely for pain control, renal management, and further evaluation. Past Medical History Past Medical History:  Diagnosis Date   Renal disorder    CKD stage 4   Rheumatic fever age 58   Patient Active Problem List   Diagnosis Date Noted   AMS (altered mental status) 05/22/2024   Chronic kidney disease, stage 3b (HCC) 05/22/2024   Stroke (HCC) 05/22/2024   History of GI bleed 03/21/2024   Other proteinuria 01/13/2023   Erectile dysfunction 09/09/2021   Severe episode of recurrent major depressive disorder, without psychotic features (HCC) 09/23/2019   Microalbuminuria 04/14/2016   Encounter for chronic pain management 04/10/2016   OCD (obsessive compulsive disorder) 04/10/2016   Chronic health problem 02/26/2016   Vitamin D  deficiency 02/26/2016   Bipolar 1 disorder, mixed, moderate (HCC) 02/22/2016   Migraines 02/22/2016   Osteoarthritis of both knees 02/22/2016   Controlled substance agreement  signed 12/13/2015   Obesity (BMI 30.0-34.9) 08/08/2015   HTN (hypertension) 08/08/2015   Type 2 diabetes mellitus with obesity (HCC) 08/08/2015   Metabolic  syndrome 08/08/2015   Hyperlipidemia 08/08/2015   Ankylosing spondylitis (HCC) 08/08/2015   S/P gastric bypass 08/06/2015   Pain in joint of right knee 07/08/2014   Home Medication(s) Prior to Admission medications   Medication Sig Start Date End Date Taking? Authorizing Provider  Accu-Chek Softclix Lancets lancets Check blood sugar once daily 06/03/24   Donzetta Rollene BRAVO, MD  amLODipine  (NORVASC ) 10 MG tablet Take 1 tablet (10 mg total) by mouth daily. 05/09/24   Myrna Bitters, DO  aspirin  EC 81 MG tablet Take 1 tablet (81 mg total) by mouth daily. Swallow whole. 05/25/24   Everhart, Kirstie, DO  atorvastatin  (LIPITOR) 40 MG tablet Take 1 tablet (40 mg total) by mouth daily. 05/25/24   Everhart, Kirstie, DO  b complex vitamins capsule Take 1 capsule by mouth daily.    [provider]  Blood Glucose Monitoring Suppl (ACCU-CHEK GUIDE) w/Device KIT Check blood sugar once daily 06/03/24   Donzetta Rollene BRAVO, MD  carvedilol  (COREG ) 3.125 MG tablet Take 3.125 mg by mouth 2 (two) times daily. 05/18/24   [provider]  Continuous Glucose Receiver (DEXCOM G7 RECEIVER) DEVI Use to monitor blood sugar Patient not taking: Reported on 06/03/2024 06/03/24   Donzetta Rollene BRAVO, MD  ezetimibe  (ZETIA ) 10 MG tablet Take 1 tablet (10 mg total) by mouth daily. 05/25/24   Everhart, Kirstie, DO  ferrous sulfate  325 (65 FE) MG tablet Take 1 tablet (325 mg total) by mouth daily with breakfast. 03/21/24   Donzetta Rollene BRAVO, MD  Lancets Misc. (ACCU-CHEK SOFTCLIX LANCET DEV) KIT Check blood sugar once daily 06/03/24   Donzetta Rollene BRAVO, MD  Multiple Vitamin (MULTIVITAMIN) tablet Take 1 tablet by mouth daily. Patient not taking: Reported on 05/26/2024    [provider]  oxyCODONE  (OXY IR/ROXICODONE ) 5 MG immediate release tablet Take 1 tablet (5 mg total)  by mouth every 6 (six) hours as needed for up to 14 days for severe pain (pain score 7-10) or moderate pain (pain score 4-6). 06/03/24 06/17/24  Donzetta Rollene BRAVO, MD  pantoprazole  (PROTONIX ) 40 MG tablet Take 1 tablet (40 mg total) by mouth daily. 05/25/24   Everhart, Kirstie, DO  QUEtiapine  (SEROQUEL ) 25 MG tablet Take 1 tablet 25mg  at night for three days, if not oversedated can increase to 2 tablets 50mg  total at night. 06/03/24   Pray, Rollene BRAVO, MD                                                                                                                                    Past Surgical History Past Surgical History:  Procedure Laterality Date   arthroscopic surgery Left years ago   both knee cartlidge surgery     GASTRIC ROUX-EN-Y N/A 08/06/2015   Procedure: LAPAROSCOPIC ROUX-EN-Y GASTRIC BYPASS WITH UPPER ENDOSCOPY;  Surgeon: Camellia Blush, MD;  Location: [facility];  Service: General;  Laterality: N/A;   HIP SURGERY Left 1986  with knee surgery   KNEE SURGERY Right 1986   benign tumor with bone graft    UPPER GI ENDOSCOPY  08/06/2015   Procedure: UPPER GI ENDOSCOPY;  Surgeon: Camellia Blush, MD;  Location: [facility];  Service: General;;   Family History Family History  Problem Relation Age of Onset   Hyperlipidemia Mother    Hypertension Mother    Breast cancer Mother    Thyroid  disease Father    Early death Father 34       Cancer   Diabetes Father    Autoimmune disease Father        Ankylosing Spondylitis   Squamous cell carcinoma Father    Diabetes Brother    Hyperlipidemia Brother    Hypertension Brother    Thyroid  disease Brother    Alcohol abuse Brother    Heart disease Maternal Grandmother    Stroke Maternal Grandmother    Heart disease Maternal Grandfather    Autoimmune disease Paternal Grandfather     Social History Social History   Tobacco Use   Smoking status: Former    Current packs/day: 0.00    Average packs/day: 0.3 packs/day for 10.0 years (2.5 ttl pk-yrs)     Types: Cigarettes    Start date: 04/16/2002    Quit date: 04/16/2012    Years since quitting: 12.1   Smokeless tobacco: Never  Vaping Use   Vaping status: Never Used  Substance Use Topics   Alcohol use: No    Alcohol/week: 0.0 standard drinks of alcohol   Drug use: No   Allergies Demerol [meperidine], Nsaids, and Tapentadol  Review of Systems A thorough review of systems was obtained and all systems are negative except as noted in the HPI and PMH.   Physical Exam Vital Signs  I have reviewed the triage vital signs BP (!) 155/89   Pulse 89   Temp 98.1 F (36.7 C) (Oral)   Resp 20   Ht 6' (1.829 m)   Wt 122.5 kg   SpO2 100%   BMI 36.62 kg/m  Physical Exam  ED Results and Treatments Labs (all labs ordered are listed, but only abnormal results are displayed) Labs Reviewed - No data to display                                                                                                                        Radiology No results found.  Pertinent labs & imaging results that were available during my care of the patient were reviewed by me and considered in my medical decision making (see MDM for details).  Medications Ordered in ED Medications - No data to display  Procedures Procedures  (including critical care time)  Medical Decision Making / ED Course    Medical Decision Making:    Kong Packett is a 53 y.o. male ***. The complaint involves an extensive differential diagnosis and also carries with it a high risk of complications and morbidity.  Serious etiology was considered. Ddx includes but is not limited to: ***  Complete initial physical exam performed, notably the patient was in ***.    Reviewed and confirmed nursing documentation for past medical history, family history, social history.  Vital signs reviewed.    ***        ***               Additional history obtained: -Additional history obtained from {[email or web address]:28072} -External records from outside source obtained and reviewed including: Chart review including previous notes, labs, imaging, consultation notes including  ***   Lab Tests: -I ordered, reviewed, and interpreted labs.   The pertinent results include:   Labs Reviewed - No data to display  Notable for ***  EKG   EKG Interpretation Date/Time:    Ventricular Rate:    PR Interval:    QRS Duration:    QT Interval:    QTC Calculation:   R Axis:      Text Interpretation:           Imaging Studies ordered: I ordered imaging studies including *** I independently visualized the following imaging with scope of interpretation limited to determining acute life threatening conditions related to emergency care; findings noted above I agree with the radiologist interpretation If any imaging was obtained with contrast I closely monitored patient for any possible adverse reaction a/w contrast administration in the emergency department   Medicines ordered and prescription drug management: No orders of the defined types were placed in this encounter.   -I have reviewed the patients home medicines and have made adjustments as needed   Consultations Obtained: I requested consultation with the ***,  and discussed lab and imaging findings as well as pertinent plan - they recommend: ***   Cardiac Monitoring: The patient was maintained on a cardiac monitor.  I personally viewed and interpreted the cardiac monitored which showed an underlying rhythm of: *** Continuous pulse oximetry interpreted by myself, ***% on ***.    Social Determinants of Health:  Diagnosis or treatment significantly limited by social determinants of health: {wssoc:28071}   Reevaluation: After the interventions noted above, I reevaluated the patient and found that they have  {resolved/improved/worsened:23923::improved}  Co morbidities that complicate the patient evaluation  Past Medical History:  Diagnosis Date   Renal disorder    CKD stage 4   Rheumatic fever age 58      Dispostion: Disposition decision including need for hospitalization was considered, and patient {wsdispo:28070::discharged from emergency department.}    Final Clinical Impression(s) / ED Diagnoses Final diagnoses:  None

## 2024-06-15 NOTE — ED Notes (Signed)
 Pt transported to MRI

## 2024-06-15 NOTE — Consult Note (Addendum)
 NEUROLOGY CONSULT NOTE   Date of service: June 15, 2024 Patient Name: Edward [name] MRN:  [id number] DOB:  06-15-1971 Chief Complaint: Weakness Requesting Provider: Franklyn Sid SAILOR, MD  History of Present Illness  Karsyn Rochin is a 53 y.o. male with hx of prior stroke on 05/22/2024 with residual right upper and lower sensation losses on aspirin  now as last day of Plavix  08/31, CKD stage IV/V, with planning for potential biopsy 09/24, ankylosing spondylitis, hypertension, hyperlipidemia, type 2 diabetes mellitus who presents with concerns of weakness.  Patient evaluated at bedside this afternoon.  He states that all of this started last week with onset of right groin pain.  Patient recently had a stroke on 05/22/2024 that left him with right upper and lower extremity deficits.  He states last Thursday, August 28th he developed right groin pain.  It has progressively became worse.  It became worse over the weekend.  He reports that at 1 AM this morning he noted that he was unable to walk.  Being concerned that the pain was getting worse and he was not able to walk he decided to call EMS.  He notes that he has a history of ankylosing spondylitis and has been previously treated with prednisone .  It has been over 6 months that he has had prednisone .  He also notes that given his kidney function recently and his recent gastric ulcer, he has been told to stop taking tramadol , NSAIDs, and muscle relaxers.  This has caused his pain to become worse.  He denies any trouble starting his urinary stream, he denies any hematuria, or any dysuria.  He states that his right groin pain starts in his back and radiates into his groin.  He describes it to be a sharp and throbbing pain.  It has been debilitating.  He states he has not had a bowel movement in 5 days.  He denies any scrotal pain.  He reports having right upper and lower extremity weakness, but this is at its baseline.     ROS  Neuro: Patient endorses  sensation loss to right upper and lower extremity. MSK: Patient endorses right groin pain GU: He denies any dysuria, trouble starting stream, or hematuria  Past History   Past Medical History:  Diagnosis Date   Renal disorder    CKD stage 4   Rheumatic fever age 49    Past Surgical History:  Procedure Laterality Date   arthroscopic surgery Left years ago   both knee cartlidge surgery     GASTRIC ROUX-EN-Y N/A 08/06/2015   Procedure: LAPAROSCOPIC ROUX-EN-Y GASTRIC BYPASS WITH UPPER ENDOSCOPY;  Surgeon: Camellia Blush, MD;  Location: [facility];  Service: General;  Laterality: N/A;   HIP SURGERY Left 1986   with knee surgery   KNEE SURGERY Right 1986   benign tumor with bone graft    UPPER GI ENDOSCOPY  08/06/2015   Procedure: UPPER GI ENDOSCOPY;  Surgeon: Camellia Blush, MD;  Location: [facility];  Service: General;;    Family History: Family History  Problem Relation Age of Onset   Hyperlipidemia Mother    Hypertension Mother    Breast cancer Mother    Thyroid  disease Father    Early death Father 78       Cancer   Diabetes Father    Autoimmune disease Father        Ankylosing Spondylitis   Squamous cell carcinoma Father    Diabetes Brother    Hyperlipidemia Brother    Hypertension Brother  Thyroid  disease Brother    Alcohol abuse Brother    Heart disease Maternal Grandmother    Stroke Maternal Grandmother    Heart disease Maternal Grandfather    Autoimmune disease Paternal Grandfather     Social History  reports that he quit smoking about 12 years ago. His smoking use included cigarettes. He started smoking about 22 years ago. He has a 2.5 pack-year smoking history. He has never used smokeless tobacco. He reports that he does not drink alcohol and does not use drugs.  Allergies  Allergen Reactions   Demerol [Meperidine] Swelling    Swelling of hands, neck   Nsaids Other (See Comments)    Gi upset, Per patient he is able to take some NSAIDs.   Tapentadol Other (See  Comments)    Skin crawling    Medications  No current facility-administered medications for this encounter.  Current Outpatient Medications:    Accu-Chek Softclix Lancets lancets, Check blood sugar once daily, Disp: 100 each, Rfl: 3   amLODipine  (NORVASC ) 10 MG tablet, Take 1 tablet (10 mg total) by mouth daily., Disp: 30 tablet, Rfl: 0   aspirin  EC 81 MG tablet, Take 1 tablet (81 mg total) by mouth daily. Swallow whole., Disp: 30 tablet, Rfl: 0   atorvastatin  (LIPITOR) 40 MG tablet, Take 1 tablet (40 mg total) by mouth daily., Disp: 30 tablet, Rfl: 0   b complex vitamins capsule, Take 1 capsule by mouth daily., Disp: , Rfl:    Blood Glucose Monitoring Suppl (ACCU-CHEK GUIDE) w/Device KIT, Check blood sugar once daily, Disp: 1 kit, Rfl: 0   carvedilol  (COREG ) 3.125 MG tablet, Take 3.125 mg by mouth 2 (two) times daily., Disp: , Rfl:    Continuous Glucose Receiver (DEXCOM G7 RECEIVER) DEVI, Use to monitor blood sugar (Patient not taking: Reported on 06/03/2024), Disp: 1 each, Rfl: 0   ezetimibe  (ZETIA ) 10 MG tablet, Take 1 tablet (10 mg total) by mouth daily., Disp: 30 tablet, Rfl: 0   ferrous sulfate  325 (65 FE) MG tablet, Take 1 tablet (325 mg total) by mouth daily with breakfast., Disp: 60 tablet, Rfl: 1   Lancets Misc. (ACCU-CHEK SOFTCLIX LANCET DEV) KIT, Check blood sugar once daily, Disp: 1 kit, Rfl: 0   Multiple Vitamin (MULTIVITAMIN) tablet, Take 1 tablet by mouth daily. (Patient not taking: Reported on 05/26/2024), Disp: , Rfl:    oxyCODONE  (OXY IR/ROXICODONE ) 5 MG immediate release tablet, Take 1 tablet (5 mg total) by mouth every 6 (six) hours as needed for up to 14 days for severe pain (pain score 7-10) or moderate pain (pain score 4-6)., Disp: 56 tablet, Rfl: 0   pantoprazole  (PROTONIX ) 40 MG tablet, Take 1 tablet (40 mg total) by mouth daily., Disp: 30 tablet, Rfl: 0   QUEtiapine  (SEROQUEL ) 25 MG tablet, Take 1 tablet 25mg  at night for three days, if not oversedated can increase to 2  tablets 50mg  total at night., Disp: 30 tablet, Rfl: 0  Vitals   Vitals:   06/15/24 1245 06/15/24 1300 06/15/24 1424 06/15/24 1600  BP: 127/68 (!) 116/94  (!) 160/97  Pulse: 93 (!) 103  (!) 102  Resp: 14 (!) 22  11  Temp:   98.1 F (36.7 C)   TempSrc:   Oral   SpO2: 95% 99%  100%  Weight:      Height:        Body mass index is 36.62 kg/m.   Physical Exam   Constitutional: Appears well-developed and well-nourished.  In obvious  pain Psych: Affect appropriate to situation Eyes: No scleral injection Head: Normocephalic Cardiovascular: Tachycardic rate Respiratory: Effort normal, non-labored breathing  Neurologic Examination   Neuro: *MS: A&O x4. Follows multi-step commands.  *Speech: no dysarthria or aphasia *CN:    I: Deferred   II,III: PERRLA, optic discs not visualized 2/2 pupillary constriction   III,IV,VI: EOMI w/o nystagmus, no ptosis   V: Decreased sensation to right side of face compared to left on V1 through V3   VII: Eyelid closure was full.  Smile symmetric.   VIII: Hearing intact to voice   IX,X: Voice normal, palate elevates symmetrically    XI: SCM/trap 5/5 bilat   XII: Tongue protrudes midline, no atrophy or fasciculations  *Motor:   Normal bulk.  No tremor, rigidity or bradykinesia. No pronator drift.   Strength: Dlt Bic Tri WE WrF FgS Gr HF KnF KnE PlF DoF    Left 5 5 5 5 5 5 5 5 5 5 5 5     Right 5 5 5 5 5 5 5 3 3 3 5 5     +Hoover's. Strength deficits likely in the setting of right groin pain.  *Sensory: Decreased sensation noted to right upper extremity throughout, right side of face, and right lower extremity on lateral aspect.  All other sensation intact. *Coordination:  Finger-to-nose intact, unable to assess heel-to-shin secondary to pain *Reflexes:  3+ and symmetric throughout with 1 beat clonus on ankle bilaterally; toes down-equivocal, *Gait: deferred  Labs/Imaging/Neurodiagnostic studies   CBC:  Recent Labs  Lab June 24, 2024 1100  WBC  14.7*  NEUTROABS 11.7*  HGB 11.2*  HCT 36.2*  MCV 91.4  PLT 469*   Basic Metabolic Panel:  Lab Results  Component Value Date   NA 135 06/24/24   K 5.0 06/24/2024   CO2 16 (L) Jun 24, 2024   GLUCOSE 141 (H) June 24, 2024   BUN 84 (H) 06-24-24   CREATININE 5.38 (H) June 24, 2024   CALCIUM  9.0 06/24/2024   GFRNONAA 12 (L) June 24, 2024   GFRAA 108 12/15/2019   Lipid Panel:  Lab Results  Component Value Date   LDLCALC 202 (H) 05/23/2024   HgbA1c:  Lab Results  Component Value Date   HGBA1C 7.4 (H) 05/23/2024   Urine Drug Screen: No results found for: LABOPIA, COCAINSCRNUR, LABBENZ, AMPHETMU, THCU, LABBARB  Alcohol Level No results found for: [facility] INR  Lab Results  Component Value Date   INR 1.2 06-24-24   APTT  Lab Results  Component Value Date   APTT 40 (H) 2024/06/24   AED levels: No results found for: PHENYTOIN, ZONISAMIDE, LAMOTRIGINE, LEVETIRACETA  CT Head without contrast(Personally reviewed): 24-Jun-2024 IMPRESSION: 1. No acute intracranial abnormality or adverse interval change. 2. Slightly less conspicuous left thalamic lacunar infarcts compared to prior study dated 05/22/2024.  MR Angio head without contrast and Carotid Duplex BL(Personally reviewed): 05/23/2024  MRA HEAD:   1. Negative intracranial MRA for large vessel occlusion. 2. Moderate long segment narrowing/stenosis of the right P2 segment. 3. Focal mild-to-moderate stenosis involving the proximal dominant left V4 segment.  MRA NECK:   Normal MRA of the neck.  MRI Brain(Personally reviewed): 05/22/2024 IMPRESSION: 1. 8 mm acute ischemic nonhemorrhagic infarct involving the ventral left thalamus. 2. Underlying age advanced chronic microvascular ischemic disease with a few scattered remote lacunar infarcts about the left basal ganglia and left thalamus.  ASSESSMENT   Zyrus Hetland is a 53 y.o. male with hx of prior stroke on 05/22/2024 with residual right upper and  lower sensation losses on aspirin   now as last day of Plavix  08/31, CKD stage IV/V, with planning for potential biopsy 09/24, ankylosing spondylitis, hypertension, hyperlipidemia, type 2 diabetes mellitus who presents with concerns of weakness.  Patient's weakness is likely secondary to his right groin pain. Upon my exam, he does have reflexes, he is able to move all of his extremities spontaneously, and is only limited by his pain.  His sensation deficits are from his previous stroke, and not new.  I do not think patient needs MRI brain at this time to evaluate for any stroke.  Would like to evaluate patient's symptoms with MRI sacrum as well as MRI lumbar spine, but will need to do without contrast secondary to kidney function.    RECOMMENDATIONS  - MRI lumbar spine and sacrum without contrast ___________________________  Signed, Libby Blanch, DO Internal Medicine Resident PGY 3  NEUROHOSPITALIST ADDENDUM Performed a face to face diagnostic evaluation.   I have reviewed the contents of history and physical exam as documented by PA/ARNP/Resident and agree with above documentation.  I have discussed and formulated the above plan as documented. Edits to the note have been made as needed.  Khriz Liddy, MD [facility]

## 2024-06-15 NOTE — ED Provider Notes (Signed)
 [facility] Provider Note  CSN: [id number] Arrival date & time: 06/15/24 0932  Chief Complaint(s) Weakness  HPI Edward [name] is a 53 y.o. maleyear-old male with a complex history including stage 5 chronic kidney disease (pending dialysis, on kidney transplant list), ankylosing spondylitis 40 years, and a recent ischemic stroke 4 weeks ago (right-sided facial and upper arm weakness, transient, now resolved, with some persistent short-term memory issues).  He presents today with severe, worsening musculoskeletal pain (diffuse, especially in the neck and back, also in joints) that has progressively worsened over several weeks, but became acutely debilitating overnight (around 1 a.m.), leaving him unable to stand or walk. Pain medications at home have been ineffective. He denies new focal weakness, vision changes, chest pain, shortness of breath, or headache. He describes the problem as severe generalized pain rather than weakness.  Additional history: CKD: Reports minimal urine output; urine light yellow/white. Bowel: Constipation likely due to chronic pain medications; last BM Saturday, on Miralax  without relief. Musculoskeletal: Chronic back pain from ankylosing spondylitis, worsening neck pain. Right-sided pain predominates (back and leg). Mild ankle pain, no acute swelling. Stroke history: Residual mild short-term memory deficit, otherwise improved.   Past Medical History Past Medical History:  Diagnosis Date   Renal disorder    CKD stage 4   Rheumatic fever age 70   Patient Active Problem List   Diagnosis Date Noted   AMS (altered mental status) 05/22/2024   Chronic kidney disease, stage 3b (HCC) 05/22/2024   Stroke (HCC) 05/22/2024   History of GI bleed 03/21/2024   Other proteinuria 01/13/2023   Erectile dysfunction 09/09/2021   Severe episode of recurrent major depressive disorder, without psychotic features (HCC) 09/23/2019    Microalbuminuria 04/14/2016   Encounter for chronic pain management 04/10/2016   OCD (obsessive compulsive disorder) 04/10/2016   Chronic health problem 02/26/2016   Vitamin D  deficiency 02/26/2016   Bipolar 1 disorder, mixed, moderate (HCC) 02/22/2016   Migraines 02/22/2016   Osteoarthritis of both knees 02/22/2016   Controlled substance agreement signed 12/13/2015   Obesity (BMI 30.0-34.9) 08/08/2015   HTN (hypertension) 08/08/2015   Type 2 diabetes mellitus with obesity (HCC) 08/08/2015   Metabolic syndrome 08/08/2015   Hyperlipidemia 08/08/2015   Ankylosing spondylitis (HCC) 08/08/2015   S/P gastric bypass 08/06/2015   Pain in joint of right knee 07/08/2014   Home Medication(s) Prior to Admission medications   Medication Sig Start Date End Date Taking? Authorizing Provider  Accu-Chek Softclix Lancets lancets Check blood sugar once daily 06/03/24   Donzetta Rollene BRAVO, MD  amLODipine  (NORVASC ) 10 MG tablet Take 1 tablet (10 mg total) by mouth daily. 05/09/24   Myrna Bitters, DO  aspirin  EC 81 MG tablet Take 1 tablet (81 mg total) by mouth daily. Swallow whole. 05/25/24   Everhart, Kirstie, DO  atorvastatin  (LIPITOR) 40 MG tablet Take 1 tablet (40 mg total) by mouth daily. 05/25/24   Everhart, Kirstie, DO  b complex vitamins capsule Take 1 capsule by mouth daily.    [provider]  Blood Glucose Monitoring Suppl (ACCU-CHEK GUIDE) w/Device KIT Check blood sugar once daily 06/03/24   Donzetta Rollene BRAVO, MD  carvedilol  (COREG ) 3.125 MG tablet Take 3.125 mg by mouth 2 (two) times daily. 05/18/24   [provider]  Continuous Glucose Receiver (DEXCOM G7 RECEIVER) DEVI Use to monitor blood sugar Patient not taking: Reported on 06/03/2024 06/03/24   Donzetta Rollene BRAVO, MD  ezetimibe  (ZETIA ) 10 MG tablet Take 1  tablet (10 mg total) by mouth daily. 05/25/24   Everhart, Kirstie, DO  ferrous sulfate  325 (65 FE) MG tablet Take 1 tablet (325 mg total) by mouth daily with breakfast. 03/21/24    Donzetta Rollene BRAVO, MD  Lancets Misc. (ACCU-CHEK SOFTCLIX LANCET DEV) KIT Check blood sugar once daily 06/03/24   Donzetta Rollene BRAVO, MD  Multiple Vitamin (MULTIVITAMIN) tablet Take 1 tablet by mouth daily. Patient not taking: Reported on 05/26/2024    [provider]  oxyCODONE  (OXY IR/ROXICODONE ) 5 MG immediate release tablet Take 1 tablet (5 mg total) by mouth every 6 (six) hours as needed for up to 14 days for severe pain (pain score 7-10) or moderate pain (pain score 4-6). 06/03/24 06/17/24  Donzetta Rollene BRAVO, MD  pantoprazole  (PROTONIX ) 40 MG tablet Take 1 tablet (40 mg total) by mouth daily. 05/25/24   Everhart, Kirstie, DO  QUEtiapine  (SEROQUEL ) 25 MG tablet Take 1 tablet 25mg  at night for three days, if not oversedated can increase to 2 tablets 50mg  total at night. 06/03/24   Pray, Rollene BRAVO, MD                                                                                                                                    Past Surgical History Past Surgical History:  Procedure Laterality Date   arthroscopic surgery Left years ago   both knee cartlidge surgery     GASTRIC ROUX-EN-Y N/A 08/06/2015   Procedure: LAPAROSCOPIC ROUX-EN-Y GASTRIC BYPASS WITH UPPER ENDOSCOPY;  Surgeon: Camellia Blush, MD;  Location: [facility];  Service: General;  Laterality: N/A;   HIP SURGERY Left 1986   with knee surgery   KNEE SURGERY Right 1986   benign tumor with bone graft    UPPER GI ENDOSCOPY  08/06/2015   Procedure: UPPER GI ENDOSCOPY;  Surgeon: Camellia Blush, MD;  Location: [facility];  Service: General;;   Family History Family History  Problem Relation Age of Onset   Hyperlipidemia Mother    Hypertension Mother    Breast cancer Mother    Thyroid  disease Father    Early death Father 51       Cancer   Diabetes Father    Autoimmune disease Father        Ankylosing Spondylitis   Squamous cell carcinoma Father    Diabetes Brother    Hyperlipidemia Brother    Hypertension Brother    Thyroid  disease  Brother    Alcohol abuse Brother    Heart disease Maternal Grandmother    Stroke Maternal Grandmother    Heart disease Maternal Grandfather    Autoimmune disease Paternal Grandfather     Social History Social History   Tobacco Use   Smoking status: Former    Current packs/day: 0.00    Average packs/day: 0.3 packs/day for 10.0 years (2.5 ttl pk-yrs)    Types: Cigarettes    Start date: 04/16/2002    Quit  date: 04/16/2012    Years since quitting: 12.1   Smokeless tobacco: Never  Vaping Use   Vaping status: Never Used  Substance Use Topics   Alcohol use: No    Alcohol/week: 0.0 standard drinks of alcohol   Drug use: No   Allergies Demerol [meperidine], Nsaids, and Tapentadol  Review of Systems General: Unable to ambulate due to pain. Neuro: No new focal deficits on interview; denies current weakness. Abdomen: Constipation noted, no acute bleeding. MSK: Diffuse pain, especially neck, back, and right-sided body. CV/Resp: No chest pain, no SOB reported. Extremities: No acute swelling observed, history of prior fluid overload. Physical Exam Vital Signs  I have reviewed the triage vital signs BP (!) 116/94   Pulse (!) 103   Temp 98.1 F (36.7 C) (Oral)   Resp (!) 22   Ht 6' (1.829 m)   Wt 122.5 kg   SpO2 99%   BMI 36.62 kg/m  General: Alert, no acute distress.  HEENT: Soft, supple, with full range of motion.  Cardiac: Regular rate and rhythm, normal S1 and S2, no murmurs, rubs, or gallops. Lungs: Clear to auscultation bilaterally in all fields, with no wheezes or crackles appreciated. Normal respiratory effort. Abdomen: Non-distended, soft, non-tender,  Extremities: Warm and well-perfused, 1+ edema on right side,  Neurological: Alert and oriented 4. Cranial nerves II-XII grossly intact.  Decree sensation to light touch in right upper quadrant and right lower quadrant, there is no decrease sensation on face .  Muscle strength right upper and lower extremities are weak (  limitation of movement due to pain but not real muscle weakness), examination of the patient as limited because of the pain on right inguinal region with lifting up the right extremity. Inguinal region: tenderness in deep palpation, no mass, no edema, erythema and  skin: No rashes noted.  ED Results and Treatments Labs (all labs ordered are listed, but only abnormal results are displayed) Labs Reviewed  CBC WITH DIFFERENTIAL/PLATELET - Abnormal; Notable for the following components:      Result Value   WBC 14.7 (*)    RBC 3.96 (*)    Hemoglobin 11.2 (*)    HCT 36.2 (*)    RDW 15.8 (*)    Platelets 469 (*)    Neutro Abs 11.7 (*)    Monocytes Absolute 1.3 (*)    Abs Immature Granulocytes 0.13 (*)    All other components within normal limits  URINALYSIS, ROUTINE W REFLEX MICROSCOPIC - Abnormal; Notable for the following components:   APPearance CLOUDY (*)    Glucose, UA 50 (*)    Hgb urine dipstick SMALL (*)    Protein, ur >=300 (*)    Bacteria, UA RARE (*)    All other components within normal limits  COMPREHENSIVE METABOLIC PANEL WITH GFR - Abnormal; Notable for the following components:   CO2 16 (*)    Glucose, Bld 141 (*)    BUN 84 (*)    Creatinine, Ser 5.38 (*)    Albumin 2.3 (*)    GFR, Estimated 12 (*)    All other components within normal limits  PROTIME-INR - Abnormal; Notable for the following components:   Prothrombin Time 16.1 (*)    All other components within normal limits  APTT - Abnormal; Notable for the following components:   aPTT 40 (*)    All other components within normal limits  CK  Radiology DG Pelvis Portable Result Date: 06/15/2024 CLINICAL DATA:  R hip pain EXAM: PORTABLE PELVIS 1-2 VIEWS COMPARISON:  May 22, 2024 FINDINGS: Unchanged appearance of the left iliac wing.No evidence of pelvic fracture or diastasis.No acute hip  fracture or dislocation.There is no evidence of arthropathy or other focal bone abnormality.Soft tissues are unremarkable. IMPRESSION: No acute fracture, pelvic bone diastasis, or dislocation. Electronically Signed   By: Rogelia Myers M.D.   On: 06/15/2024 12:45   DG Chest Portable 1 View Result Date: 06/15/2024 CLINICAL DATA:  weakness EXAM: PORTABLE CHEST - 1 VIEW COMPARISON:  June 22, 2015 FINDINGS: No focal airspace consolidation, pleural effusion, or pneumothorax. No cardiomegaly. Tortuous aorta with aortic atherosclerosis. No acute fracture or destructive lesions. Multilevel thoracic osteophytosis. IMPRESSION: No acute cardiopulmonary abnormality. Electronically Signed   By: Rogelia Myers M.D.   On: 06/15/2024 12:43   CT Head Wo Contrast Result Date: 06/15/2024 EXAM: CT HEAD WITHOUT CONTRAST 06/15/2024 10:51:48 AM TECHNIQUE: CT of the head was performed without the administration of intravenous contrast. Automated exposure control, iterative reconstruction, and/or weight based adjustment of the mA/kV was utilized to reduce the radiation dose to as low as reasonably achievable. COMPARISON: CT of the head dated 05/22/2024. CLINICAL HISTORY: Stroke, follow up; recent L thalamic stroke, worse R sided weakness. Triage notes; Pt bib PTAR from home. Complains of general weakness. Pt has been progressively getting too weak to walk. Around 0100 was the last time the pt states he was able to walk. 8/10 right groin pain that radiates to lower back. Hx of ankylosing spondylitis. FINDINGS: BRAIN AND VENTRICLES: No acute hemorrhage. No evidence of acute infarct. No hydrocephalus. No extra-axial collection. No mass effect or midline shift. 2 focal lacunar infarcts present within the left thalamus are now slightly less conspicuous and are both seen on image 16 of series 3. ORBITS: No acute abnormality. SINUSES: No acute abnormality. SOFT TISSUES AND SKULL: No acute soft tissue abnormality. No skull fracture.  VASCULATURE: Mild calcific atheromatous disease within the carotid siphons. IMPRESSION: 1. No acute intracranial abnormality or adverse interval change. 2. Slightly less conspicuous left thalamic lacunar infarcts compared to prior study dated 05/22/2024. Electronically signed by: Evalene Coho MD 06/15/2024 11:17 AM EDT RP Workstation: [id number]    Pertinent labs & imaging results that were available during my care of the patient were reviewed by me and considered in my medical decision making (see MDM for details).  Medications Ordered in ED Medications  HYDROmorphone  (DILAUDID ) injection 0.5 mg (0.5 mg Intravenous Given 06/15/24 1301)                                                                                                                                      (including critical care time)  Medical Decision Making / ED Course Medical Decision Making:   Edward [name] is a 53 y.o. maleyear-old male with a complex history including stage 5 chronic kidney  disease (pending dialysis, on kidney transplant list), ankylosing spondylitis 40 years, and a recent ischemic stroke 4 weeks ago (right-sided facial and upper arm weakness, transient, now resolved, with some persistent short-term memory issues).  #Severe generalized musculoskeletal pain / inability to ambulate Patient presented with severe generalized musculoskeletal pain as well as right-sided weakness, abdominal prior history of had residual left decree sensation in the right side, differential diagnosis recurrent CVA, electrolyte imbalance, uremia, Chronic pain due to ankylosing spondylitis (Oxycodone  ineffective, tramadol  not preferred.)  #Stage 5 CKD (ESRD) Not yet on dialysis, limited urine output, chronic fluid overload history. Uptrending BUN and creatinine BUN: 84 and creatinine 5.3 Risk for electrolyte imbalance, uremia contributing to fatigue, weakness, pain, and constipation.  #History of ischemic stroke Residual  right-sided sensory deficit.   #Constipation Likely opioid-induced; persistent despite daily Miralax .  Plan: - Labs: CBC, CMP, electrolytes, ESR/CRP, troponin, CK, urinalysis. - CT head:  1. No acute intracranial abnormality or adverse interval change.  2. Slightly less conspicuous left thalamic lacunar infarcts compared to prior   - Consider nephrology and rheumatology consults. - Neurology Consult   Reviewed and confirmed nursing documentation for past medical history, family history, social history.  Vital signs reviewed.   Additional history obtained: -Additional history obtained from spouse -External records from outside source obtained and reviewed including: Chart review including previous notes, labs, imaging, consultation notes including    Lab Tests: -I ordered, reviewed, and interpreted labs.   The pertinent results include:   Labs Reviewed  CBC WITH DIFFERENTIAL/PLATELET - Abnormal; Notable for the following components:      Result Value   WBC 14.7 (*)    RBC 3.96 (*)    Hemoglobin 11.2 (*)    HCT 36.2 (*)    RDW 15.8 (*)    Platelets 469 (*)    Neutro Abs 11.7 (*)    Monocytes Absolute 1.3 (*)    Abs Immature Granulocytes 0.13 (*)    All other components within normal limits  URINALYSIS, ROUTINE W REFLEX MICROSCOPIC - Abnormal; Notable for the following components:   APPearance CLOUDY (*)    Glucose, UA 50 (*)    Hgb urine dipstick SMALL (*)    Protein, ur >=300 (*)    Bacteria, UA RARE (*)    All other components within normal limits  COMPREHENSIVE METABOLIC PANEL WITH GFR - Abnormal; Notable for the following components:   CO2 16 (*)    Glucose, Bld 141 (*)    BUN 84 (*)    Creatinine, Ser 5.38 (*)    Albumin 2.3 (*)    GFR, Estimated 12 (*)    All other components within normal limits  PROTIME-INR - Abnormal; Notable for the following components:   Prothrombin Time 16.1 (*)    All other components within normal limits  APTT - Abnormal; Notable  for the following components:   aPTT 40 (*)    All other components within normal limits  CK    Notable for WBC: 14.7  EKG   EKG Interpretation Date/Time:  Wednesday June 15 2024 09:38:51 EDT Ventricular Rate:  88 PR Interval:  185 QRS Duration:  92 QT Interval:  346 QTC Calculation: 419 R Axis:   80  Text Interpretation: Sinus rhythm Confirmed by Pamella Sharper [phone]) on 06/15/2024 12:53:53 PM         Imaging Studies ordered: I ordered imaging studies including CT HEAD, I independently visualized the following imaging with scope of interpretation limited to determining acute life  threatening conditions related to emergency care; findings noted above I agree with the radiologist interpretation If any imaging was obtained with contrast I closely monitored patient for any possible adverse reaction a/w contrast administration in the emergency department   Medicines ordered and prescription drug management: Meds ordered this encounter  Medications   HYDROmorphone  (DILAUDID ) injection 0.5 mg    -I have reviewed the patients home medicines and have made adjustments as needed   Consultations Obtained: I requested consultation with the neurology,  and discussed lab and imaging findings as well as pertinent plan - they recommend:    Cardiac Monitoring: The patient was maintained on a cardiac monitor.  I personally viewed and interpreted the cardiac monitored which showed an underlying rhythm of:  Continuous pulse oximetry interpreted by myself, 100% on room air.    Reevaluation: After the interventions noted above, I reevaluated the patient and found that they have stayed the same  Co morbidities that complicate the patient evaluation  Past Medical History:  Diagnosis Date   Renal disorder    CKD stage 4   Rheumatic fever age 6      Dispostion: Disposition decision including need for hospitalization was considered, and patient disposition pending at time of  sign out.    Final Clinical Impression(s) / ED Diagnoses Final diagnoses:  None        Bernadine Manos, MD 06/15/24 1558    Pamella Ozell LABOR, DO 06/21/24 1507

## 2024-06-16 DIAGNOSIS — T380X5A Adverse effect of glucocorticoids and synthetic analogues, initial encounter: Secondary | ICD-10-CM | POA: Diagnosis present

## 2024-06-16 DIAGNOSIS — K5909 Other constipation: Secondary | ICD-10-CM | POA: Diagnosis present

## 2024-06-16 DIAGNOSIS — E875 Hyperkalemia: Secondary | ICD-10-CM | POA: Diagnosis present

## 2024-06-16 DIAGNOSIS — Z7682 Awaiting organ transplant status: Secondary | ICD-10-CM | POA: Diagnosis not present

## 2024-06-16 DIAGNOSIS — M456 Ankylosing spondylitis lumbar region: Secondary | ICD-10-CM | POA: Diagnosis present

## 2024-06-16 DIAGNOSIS — E669 Obesity, unspecified: Secondary | ICD-10-CM | POA: Diagnosis present

## 2024-06-16 DIAGNOSIS — D62 Acute posthemorrhagic anemia: Secondary | ICD-10-CM | POA: Diagnosis not present

## 2024-06-16 DIAGNOSIS — G8929 Other chronic pain: Secondary | ICD-10-CM | POA: Diagnosis present

## 2024-06-16 DIAGNOSIS — I120 Hypertensive chronic kidney disease with stage 5 chronic kidney disease or end stage renal disease: Secondary | ICD-10-CM | POA: Diagnosis present

## 2024-06-16 DIAGNOSIS — Y848 Other medical procedures as the cause of abnormal reaction of the patient, or of later complication, without mention of misadventure at the time of the procedure: Secondary | ICD-10-CM | POA: Diagnosis not present

## 2024-06-16 DIAGNOSIS — Z992 Dependence on renal dialysis: Secondary | ICD-10-CM

## 2024-06-16 DIAGNOSIS — D631 Anemia in chronic kidney disease: Secondary | ICD-10-CM | POA: Diagnosis present

## 2024-06-16 DIAGNOSIS — F840 Autistic disorder: Secondary | ICD-10-CM | POA: Diagnosis present

## 2024-06-16 DIAGNOSIS — R339 Retention of urine, unspecified: Secondary | ICD-10-CM | POA: Diagnosis not present

## 2024-06-16 DIAGNOSIS — E872 Acidosis, unspecified: Secondary | ICD-10-CM | POA: Diagnosis present

## 2024-06-16 DIAGNOSIS — S37012A Minor contusion of left kidney, initial encounter: Secondary | ICD-10-CM | POA: Diagnosis present

## 2024-06-16 DIAGNOSIS — N179 Acute kidney failure, unspecified: Secondary | ICD-10-CM | POA: Diagnosis present

## 2024-06-16 DIAGNOSIS — M459 Ankylosing spondylitis of unspecified sites in spine: Secondary | ICD-10-CM | POA: Diagnosis present

## 2024-06-16 DIAGNOSIS — N186 End stage renal disease: Secondary | ICD-10-CM | POA: Diagnosis present

## 2024-06-16 DIAGNOSIS — I70298 Other atherosclerosis of native arteries of extremities, other extremity: Secondary | ICD-10-CM | POA: Diagnosis not present

## 2024-06-16 DIAGNOSIS — F319 Bipolar disorder, unspecified: Secondary | ICD-10-CM | POA: Diagnosis present

## 2024-06-16 DIAGNOSIS — M549 Dorsalgia, unspecified: Secondary | ICD-10-CM | POA: Diagnosis present

## 2024-06-16 DIAGNOSIS — E785 Hyperlipidemia, unspecified: Secondary | ICD-10-CM | POA: Diagnosis present

## 2024-06-16 DIAGNOSIS — N99840 Postprocedural hematoma of a genitourinary system organ or structure following a genitourinary system procedure: Secondary | ICD-10-CM | POA: Diagnosis not present

## 2024-06-16 DIAGNOSIS — M450 Ankylosing spondylitis of multiple sites in spine: Secondary | ICD-10-CM | POA: Diagnosis not present

## 2024-06-16 DIAGNOSIS — E1122 Type 2 diabetes mellitus with diabetic chronic kidney disease: Secondary | ICD-10-CM | POA: Diagnosis present

## 2024-06-16 DIAGNOSIS — R310 Gross hematuria: Secondary | ICD-10-CM | POA: Diagnosis not present

## 2024-06-16 DIAGNOSIS — N185 Chronic kidney disease, stage 5: Secondary | ICD-10-CM | POA: Diagnosis present

## 2024-06-16 DIAGNOSIS — I69398 Other sequelae of cerebral infarction: Secondary | ICD-10-CM | POA: Diagnosis not present

## 2024-06-16 DIAGNOSIS — N320 Bladder-neck obstruction: Secondary | ICD-10-CM | POA: Diagnosis present

## 2024-06-16 DIAGNOSIS — E1165 Type 2 diabetes mellitus with hyperglycemia: Secondary | ICD-10-CM | POA: Diagnosis present

## 2024-06-16 LAB — CBC
HCT: 32.9 % — ABNORMAL LOW (ref 39.0–52.0)
Hemoglobin: 10.4 g/dL — ABNORMAL LOW (ref 13.0–17.0)
MCH: 28.4 pg (ref 26.0–34.0)
MCHC: 31.6 g/dL (ref 30.0–36.0)
MCV: 89.9 fL (ref 80.0–100.0)
Platelets: 421 K/uL — ABNORMAL HIGH (ref 150–400)
RBC: 3.66 MIL/uL — ABNORMAL LOW (ref 4.22–5.81)
RDW: 15.6 % — ABNORMAL HIGH (ref 11.5–15.5)
WBC: 12 K/uL — ABNORMAL HIGH (ref 4.0–10.5)
nRBC: 0 % (ref 0.0–0.2)

## 2024-06-16 LAB — GLUCOSE, CAPILLARY
Glucose-Capillary: 135 mg/dL — ABNORMAL HIGH (ref 70–99)
Glucose-Capillary: 158 mg/dL — ABNORMAL HIGH (ref 70–99)
Glucose-Capillary: 284 mg/dL — ABNORMAL HIGH (ref 70–99)
Glucose-Capillary: 267 mg/dL — ABNORMAL HIGH (ref 70–99)
Glucose-Capillary: 190 mg/dL — ABNORMAL HIGH (ref 70–99)

## 2024-06-16 LAB — C-REACTIVE PROTEIN: CRP: 19.2 mg/dL — ABNORMAL HIGH (ref <1.0)

## 2024-06-16 LAB — BASIC METABOLIC PANEL WITH GFR
Anion gap: 13 (ref 5–15)
BUN: 81 mg/dL — ABNORMAL HIGH (ref 6–20)
CO2: 14 mmol/L — ABNORMAL LOW (ref 22–32)
Calcium: 8.8 mg/dL — ABNORMAL LOW (ref 8.9–10.3)
Chloride: 109 mmol/L (ref 98–111)
Creatinine, Ser: 5.38 mg/dL — ABNORMAL HIGH (ref 0.61–1.24)
GFR, Estimated: 12 mL/min — ABNORMAL LOW (ref >60)
Glucose, Bld: 140 mg/dL — ABNORMAL HIGH (ref 70–99)
Potassium: 5 mmol/L (ref 3.5–5.1)
Sodium: 136 mmol/L (ref 135–145)

## 2024-06-16 LAB — SEDIMENTATION RATE: Sed Rate: 129 mm/h — ABNORMAL HIGH (ref 0–16)

## 2024-06-16 MED ORDER — PREDNISONE 20 MG PO TABS
60.0000 mg | ORAL_TABLET | Freq: Every day | ORAL | Status: AC
  Administered 2024-06-17 – 2024-06-18 (×2): 60 mg via ORAL
  Filled 2024-06-16 (×2): qty 3

## 2024-06-16 MED ORDER — EZETIMIBE 10 MG PO TABS
10.0000 mg | ORAL_TABLET | Freq: Every day | ORAL | Status: DC
  Administered 2024-06-16 – 2024-07-02 (×16): 10 mg via ORAL
  Filled 2024-06-16 (×16): qty 1

## 2024-06-16 MED ORDER — OXYCODONE HCL 5 MG PO TABS
5.0000 mg | ORAL_TABLET | ORAL | Status: DC | PRN
  Administered 2024-06-16 – 2024-06-26 (×9): 5 mg via ORAL
  Filled 2024-06-16 (×11): qty 1

## 2024-06-16 MED ORDER — PREDNISONE 20 MG PO TABS: 50.0000 mg | ORAL_TABLET | Freq: Every day | ORAL | Status: DC

## 2024-06-16 MED ORDER — ASPIRIN 81 MG PO TBEC
81.0000 mg | DELAYED_RELEASE_TABLET | Freq: Every day | ORAL | Status: DC
  Administered 2024-06-16 – 2024-06-17 (×2): 81 mg via ORAL
  Filled 2024-06-16 (×2): qty 1

## 2024-06-16 MED ORDER — INSULIN ASPART 100 UNIT/ML IJ SOLN
0.0000 [IU] | Freq: Three times a day (TID) | INTRAMUSCULAR | Status: DC
  Administered 2024-06-16 (×2): 3 [IU] via SUBCUTANEOUS
  Administered 2024-06-16 – 2024-06-17 (×2): 1 [IU] via SUBCUTANEOUS
  Administered 2024-06-17: 3 [IU] via SUBCUTANEOUS
  Administered 2024-06-17: 2 [IU] via SUBCUTANEOUS
  Administered 2024-06-18: 1 [IU] via SUBCUTANEOUS
  Administered 2024-06-18 (×2): 2 [IU] via SUBCUTANEOUS
  Administered 2024-06-19: 1 [IU] via SUBCUTANEOUS
  Administered 2024-06-19: 2 [IU] via SUBCUTANEOUS

## 2024-06-16 MED ORDER — PANTOPRAZOLE SODIUM 40 MG PO TBEC
40.0000 mg | DELAYED_RELEASE_TABLET | Freq: Every day | ORAL | Status: DC
  Administered 2024-06-16 – 2024-07-02 (×16): 40 mg via ORAL
  Filled 2024-06-16 (×16): qty 1

## 2024-06-16 MED ORDER — PREDNISONE 20 MG PO TABS: 30.0000 mg | ORAL_TABLET | Freq: Every day | ORAL | Status: DC

## 2024-06-16 MED ORDER — PREDNISONE 20 MG PO TABS: 40.0000 mg | ORAL_TABLET | Freq: Every day | ORAL | Status: DC

## 2024-06-16 MED ORDER — PREDNISONE 10 MG PO TABS: 10.0000 mg | ORAL_TABLET | Freq: Every day | ORAL | Status: DC

## 2024-06-16 MED ORDER — PREDNISONE 10 MG PO TABS
10.0000 mg | ORAL_TABLET | Freq: Every day | ORAL | Status: AC
  Administered 2024-06-27 – 2024-06-28 (×2): 10 mg via ORAL
  Filled 2024-06-16 (×2): qty 1

## 2024-06-16 MED ORDER — PREDNISONE 20 MG PO TABS
20.0000 mg | ORAL_TABLET | Freq: Every day | ORAL | Status: AC
  Administered 2024-06-25 – 2024-06-26 (×2): 20 mg via ORAL
  Filled 2024-06-16 (×2): qty 1

## 2024-06-16 MED ORDER — PREDNISONE 20 MG PO TABS
40.0000 mg | ORAL_TABLET | Freq: Every day | ORAL | Status: AC
  Administered 2024-06-21 – 2024-06-22 (×2): 40 mg via ORAL
  Filled 2024-06-16 (×2): qty 2

## 2024-06-16 MED ORDER — ATORVASTATIN CALCIUM 40 MG PO TABS
40.0000 mg | ORAL_TABLET | Freq: Every day | ORAL | Status: DC
  Administered 2024-06-16 – 2024-07-02 (×16): 40 mg via ORAL
  Filled 2024-06-16 (×16): qty 1

## 2024-06-16 MED ORDER — TIZANIDINE HCL 4 MG PO TABS
4.0000 mg | ORAL_TABLET | Freq: Three times a day (TID) | ORAL | Status: DC | PRN
  Administered 2024-06-18 – 2024-07-02 (×18): 4 mg via ORAL
  Filled 2024-06-16 (×18): qty 1

## 2024-06-16 MED ORDER — CARVEDILOL 3.125 MG PO TABS
3.1250 mg | ORAL_TABLET | Freq: Two times a day (BID) | ORAL | Status: DC
  Administered 2024-06-16 – 2024-07-02 (×34): 3.125 mg via ORAL
  Filled 2024-06-16 (×34): qty 1

## 2024-06-16 MED ORDER — QUETIAPINE FUMARATE 25 MG PO TABS
25.0000 mg | ORAL_TABLET | Freq: Every day | ORAL | Status: DC
  Administered 2024-06-16 – 2024-06-27 (×13): 25 mg via ORAL
  Filled 2024-06-16 (×13): qty 1

## 2024-06-16 MED ORDER — PREDNISONE 20 MG PO TABS
30.0000 mg | ORAL_TABLET | Freq: Every day | ORAL | Status: AC
  Administered 2024-06-23 – 2024-06-24 (×2): 30 mg via ORAL
  Filled 2024-06-16 (×2): qty 1

## 2024-06-16 MED ORDER — POLYETHYLENE GLYCOL 3350 17 G PO PACK
17.0000 g | PACK | Freq: Two times a day (BID) | ORAL | Status: DC
  Administered 2024-06-16 – 2024-07-01 (×13): 17 g via ORAL
  Filled 2024-06-16 (×25): qty 1

## 2024-06-16 MED ORDER — LIDOCAINE 5 % EX PTCH
2.0000 | MEDICATED_PATCH | CUTANEOUS | Status: DC
  Administered 2024-06-17 – 2024-06-21 (×5): 2 via TRANSDERMAL
  Filled 2024-06-16 (×5): qty 2

## 2024-06-16 MED ORDER — SENNA 8.6 MG PO TABS
1.0000 | ORAL_TABLET | Freq: Every day | ORAL | Status: DC
  Administered 2024-06-16 – 2024-06-17 (×2): 8.6 mg via ORAL
  Filled 2024-06-16 (×2): qty 1

## 2024-06-16 MED ORDER — ACETAMINOPHEN 325 MG PO TABS
650.0000 mg | ORAL_TABLET | Freq: Three times a day (TID) | ORAL | Status: DC
  Administered 2024-06-16 – 2024-07-02 (×46): 650 mg via ORAL
  Filled 2024-06-16 (×46): qty 2

## 2024-06-16 MED ORDER — OXYCODONE HCL 5 MG PO TABS
10.0000 mg | ORAL_TABLET | ORAL | Status: DC | PRN
  Administered 2024-06-16 – 2024-07-02 (×43): 10 mg via ORAL
  Filled 2024-06-16 (×43): qty 2

## 2024-06-16 MED ORDER — HEPARIN SODIUM (PORCINE) 5000 UNIT/ML IJ SOLN
5000.0000 [IU] | Freq: Three times a day (TID) | INTRAMUSCULAR | Status: AC
  Administered 2024-06-16 – 2024-06-21 (×16): 5000 [IU] via SUBCUTANEOUS
  Filled 2024-06-16 (×16): qty 1

## 2024-06-16 MED ORDER — PREDNISONE 20 MG PO TABS
50.0000 mg | ORAL_TABLET | Freq: Every day | ORAL | Status: AC
  Administered 2024-06-19 – 2024-06-20 (×2): 50 mg via ORAL
  Filled 2024-06-16 (×2): qty 1

## 2024-06-16 MED ORDER — HYDROMORPHONE HCL 1 MG/ML IJ SOLN
0.5000 mg | Freq: Four times a day (QID) | INTRAMUSCULAR | Status: DC | PRN
  Administered 2024-06-16: 0.5 mg via INTRAVENOUS
  Filled 2024-06-16: qty 0.5

## 2024-06-16 MED ORDER — AMLODIPINE BESYLATE 5 MG PO TABS
10.0000 mg | ORAL_TABLET | Freq: Every day | ORAL | Status: DC
  Administered 2024-06-16 – 2024-07-01 (×15): 10 mg via ORAL
  Filled 2024-06-16 (×5): qty 1
  Filled 2024-06-16: qty 2
  Filled 2024-06-16 (×8): qty 1
  Filled 2024-06-16: qty 2

## 2024-06-16 MED ORDER — PREDNISONE 20 MG PO TABS: 60.0000 mg | ORAL_TABLET | Freq: Every day | ORAL | Status: DC

## 2024-06-16 MED ORDER — PREDNISONE 20 MG PO TABS: 20.0000 mg | ORAL_TABLET | Freq: Every day | ORAL | Status: DC

## 2024-06-16 NOTE — Evaluation (Signed)
 Physical Therapy Evaluation Patient Details Name: Edward [name] MRN: [id number] DOB: [birth year] Today's Date: 06/16/2024  History of Present Illness  Pt is a 53 y.o. male presenting 06/15/24 with R groin pain that radiates to lower back and causing LE weakness and inability to walk.  MRI showed increased size L4-5 disc extrusion and unchanged L5-S1 central disc protrusion.  PMH: DMII, HTN, HLD, ankylosing spondylitis in spine, bipolar 1, CKD V (pending HD), gastric bypass, L thalamic CVA 05/22/24, remote infarcts L basal ganglia and L thalamus  Clinical Impression  Pt admitted with above diagnosis. Pt has been staying with his mother since CVA last month. Has been mobilizing independently until recently when he was taken off all anti-inflammatory meds/ muscle relaxers/ etc due to declining kidney function and he had progressive increase of pain, particularly in the R groin region. Pt has been unable to ambulate for several days. On eval he was unable to tolerate rolling or coming to sitting. The only motion he could tolerate was bed into chair and UE and LE there ex of distal musculature. Patient will benefit from continued inpatient follow up therapy, <3 hours/day.  Pt currently with functional limitations due to the deficits listed below (see PT Problem List). Pt will benefit from acute skilled PT to increase their independence and safety with mobility to allow discharge.           If plan is discharge home, recommend the following: Assistance with cooking/housework;Assist for transportation;Help with stairs or ramp for entrance;Two people to help with walking and/or transfers;Two people to help with bathing/dressing/bathroom   Can travel by private vehicle   No    Equipment Recommendations Other (comment) (TBD)  Recommendations for Other Services  OT consult    Functional Status Assessment Patient has had a recent decline in their functional status and demonstrates the ability to make  significant improvements in function in a reasonable and predictable amount of time.     Precautions / Restrictions Precautions Precautions: Fall Restrictions Weight Bearing Restrictions Per Provider Order: No      Mobility  Bed Mobility Overal bed mobility: Needs Assistance Bed Mobility: Rolling Rolling: Max assist         General bed mobility comments: attempted rolling R and L and pt was unable to achieve full SL on either side due to R groin pain. Tolerated AA bridging of R knee one time but when attempted again was resisting all motion due to pain. Attempted pivot to EOB with HOB elevated but pt was unable to tolerate this. Bed placed in chair position which pt did tolerate and focused on ROM that did not exacerbate R groin pain    Transfers                   General transfer comment: unable    Ambulation/Gait               General Gait Details: currently unable to come to EOB to stand  Stairs            Wheelchair Mobility     Tilt Bed    Modified Rankin (Stroke Patients Only) Modified Rankin (Stroke Patients Only) Pre-Morbid Rankin Score: Slight disability Modified Rankin: Severe disability     Balance Overall balance assessment: Needs assistance   Sitting balance-Leahy Scale: Zero Sitting balance - Comments: currently needs full support  Standardized Balance Assessment Standardized Balance Assessment : Berg Balance Test, Dynamic Gait Index           Pertinent Vitals/Pain Pain Assessment Pain Assessment: 0-10 Pain Score: 10-Worst pain ever Pain Location: R groin Pain Descriptors / Indicators: Stabbing, Grimacing, Guarding Pain Intervention(s): Limited activity within patient's tolerance, Monitored during session    Home Living Family/patient expects to be discharged to:: Private residence Living Arrangements: Parent Available Help at Discharge: Family;Available PRN/intermittently Type of  Home: House Home Access: Stairs to enter Entrance Stairs-Rails: None Entrance Stairs-Number of Steps: 1   Home Layout: One level Home Equipment: Cane - quad Additional Comments: pt has been staying at his mom's house, she cannot physically assist him    Prior Function Prior Level of Function : Independent/Modified Independent             Mobility Comments: 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, has been unable to go back to work since CVA but was still mobile until past few days when he became unable to walk ADLs Comments: independent until a few days ago     Extremity/Trunk Assessment   Upper Extremity Assessment Upper Extremity Assessment: Generalized weakness;Defer to OT evaluation    Lower Extremity Assessment Lower Extremity Assessment: Generalized weakness;RLE deficits/detail RLE Deficits / Details: hip flex 1/5 and pt cannot barely tolerate due to groin pain, knee ext 3/5, limited testing due to pain RLE: Unable to fully assess due to pain RLE Sensation:  (hypersensitivity) RLE Coordination: decreased gross motor    Cervical / Trunk Assessment Cervical / Trunk Assessment: Kyphotic  Communication   Communication Communication: Impaired Factors Affecting Communication: Reduced clarity of speech (occasional slurred speech)    Cognition Arousal: Alert Behavior During Therapy: WFL for tasks assessed/performed   PT - Cognitive impairments: Memory, History of cognitive impairments                       PT - Cognition Comments: STM deficits since CVA as well as word finding difficulties. Pt is aware of these deficits Following commands: Intact       Cueing Cueing Techniques: Verbal cues     General Comments      Exercises General Exercises - Lower Extremity Ankle Circles/Pumps: AROM, Both, 20 reps, Seated Gluteal Sets: AROM, Both, 10 reps, Seated Long Arc Quad: AROM, Both, 10 reps, Seated Heel Slides: AAROM, Right (1x) Other  Exercises Other Exercises: B hip IR/ ER x10 Other Exercises: UE ROM   Assessment/Plan    PT Assessment Patient needs continued PT services  PT Problem List Decreased range of motion;Decreased balance;Decreased mobility;Decreased knowledge of use of DME;Decreased cognition;Pain;Impaired sensation;Decreased activity tolerance;Decreased strength       PT Treatment Interventions DME instruction;Gait training;Stair training;Functional mobility training;Therapeutic activities;Therapeutic exercise;Balance training;Neuromuscular re-education;Cognitive remediation;Patient/family education;Wheelchair mobility training    PT Goals (Current goals can be found in the Care Plan section)  Acute Rehab PT Goals Patient Stated Goal: Get well, return to work PT Goal Formulation: With patient Time For Goal Achievement: 06/30/24 Potential to Achieve Goals: Fair    Frequency Min 2X/week     Co-evaluation               AM-PAC PT 6 Clicks Mobility  Outcome Measure Help needed turning from your back to your side while in a flat bed without using bedrails?: Total Help needed moving from lying on your back to sitting on the side of a flat bed without using bedrails?: Total Help needed  moving to and from a bed to a chair (including a wheelchair)?: Total Help needed standing up from a chair using your arms (e.g., wheelchair or bedside chair)?: Total Help needed to walk in hospital room?: Total Help needed climbing 3-5 steps with a railing? : Total 6 Click Score: 6    End of Session   Activity Tolerance: Patient limited by pain Patient left: with call bell/phone within reach;in bed Nurse Communication: Mobility status PT Visit Diagnosis: Other symptoms and signs involving the nervous system (R29.898);Pain;Muscle weakness (generalized) (M62.81) Pain - Right/Left: Right Pain - part of body:  (groin)    Time: 9074-9043 PT Time Calculation (min) (ACUTE ONLY): 31 min   Charges:   PT  Evaluation $PT Eval Moderate Complexity: 1 Mod PT Treatments $Therapeutic Exercise: 8-22 mins PT General Charges $$ ACUTE PT VISIT: 1 Visit         Richerd Lipoma, PT  Acute Rehab Services Secure chat preferred Office [phone]   [place] L Eyana Stolze 06/16/2024, 11:13 AM

## 2024-06-16 NOTE — Assessment & Plan Note (Addendum)
 Known Ankylosing Spondylitis, likely in flare currently but unable to take NSAID due to CKD. S/p 2 doses of 0.5 mg dilaudid  in ED with some improvement. Willing to consider biologics but management complicated by need for possible kidney transplant as below. Chronic constipation, will add senna to home Miralax  but may need titration given last BM 5 days ago. - F/u MRI sacrum results - Pain regimen   - Acetaminophen  650 mg every 8 hours scheduled  - Oxycodone  5 mg every 4 hours as needed for moderate pain  - Oxycodone  10 mg every 4 hours as needed for severe pain  - Dilaudid  0.5 mg IV every 6 hours as needed for breakthrough pain  - Lidocaine  patches  - Tizanidine  4 mg every 8 hours as needed - Bowel regimen: Miralax , senna - PT/OT - Fall precautions - Sed rate, CRP, BMP, CBC in the a.m.

## 2024-06-16 NOTE — H&P (Addendum)
 Hospital Admission History and Physical Service Pager: [phone]  Patient name: Edward [name] Medical record number: [id number] Date of Birth: Jul 30, 1971 Age: 53 y.o. Gender: male  Primary Care Provider: Donzetta Quant, MD  Consultants: None Code Status: Full code Preferred Emergency Contact:  Contact Information     Name Relation Home Work Mobile   Clapp,Diane Mother [phone]        Other Contacts   None on File      Chief Complaint: Back pain  Differential and Medical Decision Making:  Edward [name] is a 53 y.o. male presenting with back pain radiating to the right groin.   Differential for this patient's presentation of this includes back pain 2/2 ankylosing spondylitis (most likely given known history of ankylosing spondylitis, current pain consistent with previous flare ups, and lumbar MRI), infection (less likely, elevated WBC, but afebrile, no sick symptoms, and no sources of infection found on imaging), tumor (low probability based on lumbar MRI), and cord impingement (unlikely given history and lumbar MRI findings).  Assessment & Plan Back pain Ankylosing spondylitis, unspecified site of spine (HCC) Known Ankylosing Spondylitis, likely in flare currently but unable to take NSAID due to CKD. S/p 2 doses of 0.5 mg dilaudid  in ED with some improvement. Willing to consider biologics but management complicated by need for possible kidney transplant as below. Chronic constipation, will add senna to home Miralax  but may need titration given last BM 5 days ago. - F/u MRI sacrum results - Pain regimen   - Acetaminophen  650 mg every 8 hours scheduled  - Oxycodone  5 mg every 4 hours as needed for moderate pain  - Oxycodone  10 mg every 4 hours as needed for severe pain  - Dilaudid  0.5 mg IV every 6 hours as needed for breakthrough pain  - Lidocaine  patches  - Tizanidine  4 mg every 8 hours as needed - Bowel regimen: Miralax , senna - PT/OT - Fall precautions - Sed rate,  CRP, BMP, CBC in the a.m. CKD (chronic kidney disease), stage V (HCC) Not on dialysis.  Oliguric at baseline.  Likely secondary to nephrotic syndrome per Nephrology, pending OP kidney biopsy and may consider kidney transplant. Cr worse from prior, new baseline around 4-4.7. - Renal diet - Strict I's and O's - Consider Nephrology consultation if continues to acutely worsen   Chronic and Stable Conditions: HTN - continue home amlodipine  10 mg, carvedilol  3.125 mg twice daily CVA 4 weeks ago - continue home aspirin  81 mg HLD - continue home atorvastatin  40 mg, ezetimibe  10 mg T2DM - Diet controlled. Very sensitive SSI with CBGs Anxiety - continue home quetiapine  25 mg at bedtime (can consider titrating up to 50 mg per Dr. Serena last note) OSA - home CPAP nightly  FEN/GI: Renal diet fluid restriction 1200 mL VTE Prophylaxis: Heparin   Disposition: Med Surg  History of Present Illness:  Edward [name] is a 53 y.o. male presenting with back and right leg pain.  Reports back pain that radiates down the back of his R leg. Cannot walk or stand due to pain. Began on Monday but had been feeling tightness over the past week. Horrific pain with even slight movement of R leg. Using Oxycodone  5mg  3-4 times per day. Taking Tylenol  4 pills, 500mg  tablets per day. Tramadol  used to work for years but stopped due to CKD. Took Tizanidine  at times. Has not tried topical lidocaine .  Feels the pain is like his flares but more intense than usual but has had it in  the same area before. Denis injury or trauma. Feels like he gets more stressed and makes pain worse. Think he is open to biologic therapy but considered how it would impact a kidney transplant.  Taken off NSAIDs and pain meds due to CKD5. Felt it was only a matter of time before flare. Making small amount of urine, urinating twice per day. No hematuria. No BM since Saturday. Taking Miralax  sine that time but it has not worked. Eating less but still  drinking. Denies fevers.   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 Has 2 daughters, 19 and 19 yo.   In the ED, received Dilaudid  with some relief in pain.  CT head showed no acute abnormality.  Chest and pelvis extremities unremarkable. Lumbar MRI showed increased size of right subarticular disc extrusion, unchanged small central disk protrusion without spinal stenosis, and unchanged moderate bilateral neruoformainal stenosis.  MRI sacrum SI joints pending read.  Review Of Systems: Per HPI  Pertinent Past Medical History: Obesity HTN T2DM HLD CVA with residual right sensory changes on R arm and leg CKD stage 5 not on dialysis  Ankylosing spondylitis Remainder reviewed in history tab.   Pertinent Past Surgical History: Gastric bypass Knee surgery (benign tumor with bone graft)  Reports multiple surgeries related to prior sport injuries Remainder reviewed in history tab.   Pertinent Social History: Tobacco use: Stopped 4 weeks ago. Smoked about 3-4 cigarettes/day x 15 years Alcohol use: Rare, last drink months ago Other Substance use: None Lives with mother and step father  Pertinent Family History: Father - ankylosing spondylitis, squamous cell carcinoma, DM Mother - HTN, breast cancer, HLD, HTN Maternal grandmother - stroke   Important Outpatient Medications: *Took AM meds yesterday  Amlodipine  10 mg daily  ASA 81 mg  Atorvastatin  40 mg  Carvedilol  3.125 mg BID Ezetimibe  10 mg  Ferrous sulfate  325 mg daily  Oxycodone  5 mg Q6h PRN Pantoprazole  40 mg  Quetiapine  25 mg   Objective: BP (!) 152/88   Pulse (!) 104   Temp 98.1 F (36.7 C) (Oral)   Resp 16   Ht 6' (1.829 m)   Wt 122.5 kg   SpO2 100%   BMI 36.62 kg/m  Exam: General: Grimacing in pain, NAD ENTM: Mucous membranes moist, throat nonerythematous Cardiovascular: Tachycardic, normal rhythm, no M/R/G Respiratory: CTAB anteriorly, normal work of breathing on room air Gastrointestinal: Soft, nondistended,  nontender to palpation MSK: Diminished movement of bilateral lower extremities secondary to pain R>L Derm: No rashes or skin lesions noted Neuro: PERRL, EOM grossly intact, symmetric facial movements, hearing grossly intact, strength 5/5 in upper bilateral extremities, strength 3/5 in left lower extremity (limited due to pain), strength 2/5 in right lower extremity (limited due to pain), sensation diminished on right lower extremity compared to left (unchanged since CVA) Psych: Mood and affect appropriate  Labs:  CBC BMET  Recent Labs  Lab 06/15/24 1100  WBC 14.7*  HGB 11.2*  HCT 36.2*  PLT 469*   Recent Labs  Lab 06/15/24 1059  NA 135  K 5.0  CL 106  CO2 16*  BUN 84*  CREATININE 5.38*  GLUCOSE 141*  CALCIUM  9.0    Pertinent additional labs: CK: 53 UA: Cloudy, 50 glucose, small hemoglobin, greater than 300 protein, rare bacteria, small leukocytes, hyaline casts  EKG: NSR  Imaging Studies Performed:  CT head 1. No acute intracranial abnormality or adverse interval change. 2. Slightly less conspicuous left thalamic lacunar infarcts compared to prior study dated 05/22/2024.  Chest x-ray Impression from Radiologist: No acute cardiopulmonary abnormality.    My Interpretation: No focal consolidations, no pleural effusions, no cardiomegaly  Pelvis x-ray  Impression from Radiologist: No acute fracture, pelvic bone diastasis, or dislocation.   My Interpretation: No acute fracture   MR Sacrum SI joints Read pending  MR Lumbar Spine 1. L4-5: Increased size of right subarticular disc extrusion with minimal inferior migration and narrowing of the right lateral recess. 2. L5-S1: Unchanged small central disc protrusion without central spinal canal stenosis. Unchanged moderate bilateral neuroforaminal stenosis.  Raguel Lee, DO 06/16/2024, 12:31 AM PGY-1,  Family Medicine  FPTS Intern pager: [phone], text pages welcome Secure chat group [facility] Teaching Service   Upper Level Addendum:   I have seen and evaluated this patient along with Dr. Lee and reviewed the above note, making necessary revisions as appropriate.  I agree with the medical decision making and physical exam as noted above.   Izetta Nap, DO PGY-3, [facility]

## 2024-06-16 NOTE — Plan of Care (Signed)
 IMPRESSION: 1. Symmetric marrow edema along the sacroiliac joints with suspected erosions, favoring sacroiliitis related to ankylosing spondylitis. 2. Tendinopathy and potentially mild partial tearing of the right proximal hamstring tendon. Minimal tendinopathy of the left proximal hamstring tendon. 3. Trace edema in the bilateral hip adductor musculature adjacent to the pubis. 4. Subtle endplate edema at O5-4 and L5-S1.    MRI: Lumber: slight worsening of L4/5 disk bulge.   Pain control is recommended given tendinopathy and partial tear.  Consult Neurosurgery if he does not improve for L4/5 disk.  PT/OT.  Neurology will sign off.

## 2024-06-16 NOTE — Progress Notes (Signed)
 FMTS Interim Progress Note  S: [name] is a 53 y.o. male presenting with back pain radiating to the right groin. Patient with history of Ankylosing Spondylitis, likely in flare currently but unable to take NSAID due to CKD. PMH of Obesity, HTN, T2DM, HLD, CVA, CKD stage 5 not currently on dialysis.  O: BP (!) 149/88 (BP Location: Left Arm)   Pulse 96   Temp (!) 97.4 F (36.3 C)   Resp 18   Ht 6' (1.829 m)   Wt 122.5 kg   SpO2 97%   BMI 36.62 kg/m    Physical Exam Cardiovascular:     Rate and Rhythm: Normal rate.     Pulses: Normal pulses.  Pulmonary:     Effort: Pulmonary effort is normal.     Breath sounds: Normal breath sounds.  Abdominal:     Palpations: Abdomen is soft.  Musculoskeletal:        General: Tenderness (back) present.  Skin:    General: Skin is warm and dry.     Capillary Refill: Capillary refill takes less than 2 seconds.  Neurological:     Mental Status: He is oriented to person, place, and time. Mental status is at baseline.     Sensory: Sensory deficit (residual right sensory changes on R arm and leg,) present.  Psychiatric:        Mood and Affect: Mood normal.        Behavior: Behavior normal.     A/P: Back pain w/ known Ankylosing spondylitis, unspecified site of spine (HCC)  Pain improved during round this morning. MRI on 9/4 shows sacroiliitis related to ankylosing spondylitis.  Neurology was consulted and signed off.  Pain medications      Tylenol  650 mg Q8H      Oxycodone  5 - 10 mg Q4H PRN for moderate and severe pain     Dilaudid  0.5 mg IV Q6H PRN for breakthrough pain      Tizanidine  4 mg every 8 hours PRN Will starting Prednisone  60 mg taper dose tomorrow (06/17/24) for sacroiliitis  Need outpatient f/u with Rheumatology   Constipation Last BM was 06/11/24  MiraLax  17 g Daily Senna 8.6 mg Daily   CKD Stage 5  Kidney fx at baseline prior to admission BUN/sCr. Pt continue to make urine. Electrolyte in normal range.  - Pending  Renal biopsy : Will consult nephrology for possible Renal biopsy during this admission  GI : Regular diet  Suzen Houston NOVAK, DO 06/16/2024, 10:28 AM PGY-1, [facility] Family Medicine Service pager [phone]

## 2024-06-16 NOTE — TOC Initial Note (Addendum)
 Transition of Care [facility]) - Initial/Assessment Note    Patient Details  Name: Edward [name] MRN: [id number] Date of Birth: [birth year]  Transition of Care [facility]) CM/SW Contact:    Bridget Cordella Simmonds, LCSW Phone Number: 06/16/2024, 4:16 PM  Clinical Narrative:   CSW met with pt regarding PT recommendation for SNF.  Pt lives with mother Diane, step father currently, no current services.  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  Pt is agreeable to SNF as cannot get up stairs at mother's townhome. Permission given to send out referral in the hub.  Referral sent out in hub for SNF.                PASSR requires additional info.   Expected Discharge Plan: Skilled Nursing Facility Barriers to Discharge: Continued Medical Work up, SNF Pending bed offer   Patient Goals and CMS Choice Patient states their goals for this hospitalization and ongoing recovery are:: get back to work          Expected Discharge Plan and Services In-house Referral: Clinical Social Work   Post Acute Care Choice: Skilled Nursing Facility Living arrangements for the past 2 months: Single Family Home                                      Prior Living Arrangements/Services Living arrangements for the past 2 months: Single Family Home Lives with:: Parents (mother and step father) Patient language and need for interpreter reviewed:: Yes Do you feel safe going back to the place where you live?: Yes      Need for Family Participation in Patient Care: No (Comment) Care giver support system in place?: Yes (comment) Current home services: Other (comment) (none) Criminal Activity/Legal Involvement Pertinent to Current Situation/Hospitalization: No - Comment as needed  Activities of Daily Living   ADL Screening (condition at time of admission) Independently performs ADLs?: Yes (appropriate for developmental age) Is the patient deaf or have difficulty hearing?: No Does the patient have difficulty seeing, even  when wearing glasses/contacts?: No Does the patient have difficulty concentrating, remembering, or making decisions?: No  Permission Sought/Granted Permission sought to share information with : Family Supports Permission granted to share information with : Yes, Verbal Permission Granted  Share Information with NAME: mother Diane  Permission granted to share info w AGENCY: SNF        Emotional Assessment Appearance:: Appears stated age Attitude/Demeanor/Rapport: Engaged Affect (typically observed): Appropriate, Pleasant Orientation: : Oriented to Self, Oriented to Place, Oriented to  Time, Oriented to Situation      Admission diagnosis:  Ankylosing spondylitis (HCC) [M45.9] Right inguinal pain [R10.31] AKI (acute kidney injury) (HCC) [N17.9] Ankylosing spondylitis, unspecified site of spine [facility]) [M45.9] Patient Active Problem List   Diagnosis Date Noted   Back pain 06/16/2024   Ankylosing spondylitis, unspecified site of spine (HCC) 06/16/2024   CKD (chronic kidney disease), stage V (HCC) 06/16/2024   AMS (altered mental status) 05/22/2024   Chronic kidney disease, stage 3b (HCC) 05/22/2024   Stroke (HCC) 05/22/2024   History of GI bleed 03/21/2024   Other proteinuria 01/13/2023   Erectile dysfunction 09/09/2021   Severe episode of recurrent major depressive disorder, without psychotic features (HCC) 09/23/2019   Microalbuminuria 04/14/2016   Encounter for chronic pain management 04/10/2016   OCD (obsessive compulsive disorder) 04/10/2016   Chronic health problem 02/26/2016   Vitamin D  deficiency 02/26/2016   Bipolar 1 disorder,  mixed, moderate (HCC) 02/22/2016   Migraines 02/22/2016   Osteoarthritis of both knees 02/22/2016   Controlled substance agreement signed 12/13/2015   Obesity (BMI 30.0-34.9) 08/08/2015   HTN (hypertension) 08/08/2015   Type 2 diabetes mellitus with obesity (HCC) 08/08/2015   Metabolic syndrome 08/08/2015   Hyperlipidemia 08/08/2015    Ankylosing spondylitis (HCC) 08/08/2015   S/P gastric bypass 08/06/2015   Pain in joint of right knee 07/08/2014   PCP:  Donzetta Rollene BRAVO, MD Pharmacy:   [facility] [address] [place] [address] Phone: [phone] Fax: [phone]  [id number], [place] - [address] [facility] [place] [address] Phone: [phone] Fax: [phone]     Social Drivers of Health (SDOH) Social History: SDOH Screenings   Food Insecurity: No Food Insecurity (06/16/2024)  Housing: Low Risk  (06/16/2024)  Transportation Needs: No Transportation Needs (06/16/2024)  Utilities: Not At Risk (06/16/2024)  Alcohol Screen: Low Risk  (03/20/2024)  Depression (PHQ2-9): High Risk (06/03/2024)  Financial Resource Strain: Patient Declined (03/20/2024)  Physical Activity: Insufficiently Active (03/20/2024)  Social Connections: Socially Isolated (06/16/2024)  Stress: Stress Concern Present (03/20/2024)  Tobacco Use: Medium Risk (06/03/2024)   SDOH Interventions:     Readmission Risk Interventions     No data to display

## 2024-06-16 NOTE — NC FL2 (Signed)
 [place]  MEDICAID [id number] LEVEL OF CARE FORM     IDENTIFICATION  Patient Name: Edward [name] Birthdate: 08-07-1971 Sex: male Admission Date (Current Location): 06/15/2024  [facility] and [place] Number:  Producer, television/film/video and Address:  [facility], [address] [place], [place] [address]      Provider Number: [id number]  Attending Physician Name and Address:  Donah Laymon PARAS, MD  Relative Name and Phone Number:  Clapp,Diane Mother [phone]    Current Level of Care: Hospital Recommended Level of Care: Skilled Nursing Facility Prior Approval Number:    Date Approved/Denied:   PASRR Number:    Discharge Plan: SNF    Current Diagnoses: Patient Active Problem List   Diagnosis Date Noted   Back pain 06/16/2024   Ankylosing spondylitis, unspecified site of spine (HCC) 06/16/2024   CKD (chronic kidney disease), stage V (HCC) 06/16/2024   AMS (altered mental status) 05/22/2024   Chronic kidney disease, stage 3b (HCC) 05/22/2024   Stroke (HCC) 05/22/2024   History of GI bleed 03/21/2024   Other proteinuria 01/13/2023   Erectile dysfunction 09/09/2021   Severe episode of recurrent major depressive disorder, without psychotic features (HCC) 09/23/2019   Microalbuminuria 04/14/2016   Encounter for chronic pain management 04/10/2016   OCD (obsessive compulsive disorder) 04/10/2016   Chronic health problem 02/26/2016   Vitamin D  deficiency 02/26/2016   Bipolar 1 disorder, mixed, moderate (HCC) 02/22/2016   Migraines 02/22/2016   Osteoarthritis of both knees 02/22/2016   Controlled substance agreement signed 12/13/2015   Obesity (BMI 30.0-34.9) 08/08/2015   HTN (hypertension) 08/08/2015   Type 2 diabetes mellitus with obesity (HCC) 08/08/2015   Metabolic syndrome 08/08/2015   Hyperlipidemia 08/08/2015   Ankylosing spondylitis (HCC) 08/08/2015   S/P gastric bypass 08/06/2015   Pain in joint of right knee 07/08/2014    Orientation  RESPIRATION BLADDER Height & Weight     Self, Time, Situation, Place  Normal Continent Weight: 270 lb (122.5 kg) Height:  6' (182.9 cm)  BEHAVIORAL SYMPTOMS/MOOD NEUROLOGICAL BOWEL NUTRITION STATUS      Continent Diet (see discharge summary)  AMBULATORY STATUS COMMUNICATION OF NEEDS Skin   Total Care Verbally Other (Comment) (ecchymosis)                       Personal Care Assistance Level of Assistance  Bathing, Feeding, Dressing Bathing Assistance: Maximum assistance Feeding assistance: Independent Dressing Assistance: Maximum assistance     Functional Limitations Info  Sight, Hearing, Speech Sight Info: Adequate Hearing Info: Adequate Speech Info: Adequate    SPECIAL CARE FACTORS FREQUENCY  PT (By licensed PT), OT (By licensed OT)     PT Frequency: 5x week OT Frequency: 5x week            Contractures Contractures Info: Not present    Additional Factors Info  Code Status, Allergies, Insulin  Sliding Scale Code Status Info: full Allergies Info: Demerol (Meperidine), Nsaids, Tapentadol, Tramadol    Insulin  Sliding Scale Info: Novolog : see discharge summary       Current Medications (06/16/2024):  This is the current hospital active medication list Current Facility-Administered Medications  Medication Dose Route Frequency Provider Last Rate Last Admin   acetaminophen  (TYLENOL ) tablet 650 mg  650 mg Oral Q8H Theophilus Pagan, MD   650 mg at 06/16/24 1452   amLODipine  (NORVASC ) tablet 10 mg  10 mg Oral Daily Theophilus Pagan, MD   10 mg at 06/16/24 1036   aspirin  EC tablet 81  mg  81 mg Oral Daily Theophilus Pagan, MD   81 mg at 06/16/24 1036   atorvastatin  (LIPITOR) tablet 40 mg  40 mg Oral Daily Theophilus Pagan, MD   40 mg at 06/16/24 1036   carvedilol  (COREG ) tablet 3.125 mg  3.125 mg Oral BID Theophilus Pagan, MD   3.125 mg at 06/16/24 1036   ezetimibe  (ZETIA ) tablet 10 mg  10 mg Oral Daily Theophilus Pagan, MD   10 mg at 06/16/24 1036   heparin  injection  5,000 Units  5,000 Units Subcutaneous Q8H Theophilus Pagan, MD   5,000 Units at 06/16/24 1452   HYDROmorphone  (DILAUDID ) injection 0.5 mg  0.5 mg Intravenous Q6H PRN Theophilus Pagan, MD   0.5 mg at 06/16/24 0155   insulin  aspart (novoLOG ) injection 0-6 Units  0-6 Units Subcutaneous TID WC Theophilus Pagan, MD   3 Units at 06/16/24 1150   lidocaine  (LIDODERM ) 5 % 2 patch  2 patch Transdermal Q24H Theophilus Pagan, MD       oxyCODONE  (Oxy IR/ROXICODONE ) immediate release tablet 5 mg  5 mg Oral Q4H PRN Theophilus Pagan, MD   5 mg at 06/16/24 1136   Or   oxyCODONE  (Oxy IR/ROXICODONE ) immediate release tablet 10 mg  10 mg Oral Q4H PRN Theophilus Pagan, MD       pantoprazole  (PROTONIX ) EC tablet 40 mg  40 mg Oral Daily Theophilus Pagan, MD   40 mg at 06/16/24 1035   polyethylene glycol (MIRALAX  / GLYCOLAX ) packet 17 g  17 g Oral BID Theophilus Pagan, MD   17 g at 06/16/24 1035   [START ON 06/17/2024] predniSONE  (DELTASONE ) tablet 60 mg  60 mg Oral Q breakfast Tharon Lung, MD       Followed by   NOREEN ON 06/19/2024] predniSONE  (DELTASONE ) tablet 50 mg  50 mg Oral Q breakfast Tharon Lung, MD       Followed by   NOREEN ON 06/21/2024] predniSONE  (DELTASONE ) tablet 40 mg  40 mg Oral Q breakfast Tharon Lung, MD       Followed by   NOREEN ON 06/23/2024] predniSONE  (DELTASONE ) tablet 30 mg  30 mg Oral Q breakfast Tharon Lung, MD       Followed by   NOREEN ON 06/25/2024] predniSONE  (DELTASONE ) tablet 20 mg  20 mg Oral Q breakfast Tharon Lung, MD       Followed by   NOREEN ON 06/27/2024] predniSONE  (DELTASONE ) tablet 10 mg  10 mg Oral Q breakfast Tharon Lung, MD       QUEtiapine  (SEROQUEL ) tablet 25 mg  25 mg Oral QHS Theophilus Pagan, MD   25 mg at 06/16/24 0155   senna (SENOKOT) tablet 8.6 mg  1 tablet Oral Daily Theophilus Pagan, MD   8.6 mg at 06/16/24 1036   tiZANidine  (ZANAFLEX ) tablet 4 mg  4 mg Oral Q8H PRN Theophilus Pagan, MD         Discharge Medications: Please see discharge summary for a list  of discharge medications.  Relevant Imaging Results:  Relevant Lab Results:   Additional Information SSN: [id number]  Bridget Cordella Simmonds, LCSW

## 2024-06-16 NOTE — Evaluation (Signed)
 Occupational Therapy Evaluation Patient Details Name: Edward [name] MRN: [id number] DOB: 1971/02/13 Today's Date: 06/16/2024   History of Present Illness   Pt is a 53 y.o. male presenting 06/15/24 with R groin pain that radiates to lower back and causing LE weakness and inability to walk.  MRI showed increased size L4-5 disc extrusion and unchanged L5-S1 central disc protrusion.  PMH: DMII, HTN, HLD, ankylosing spondylitis in spine, bipolar 1, CKD V (pending HD), gastric bypass, L thalamic CVA 05/22/24, remote infarcts L basal ganglia and L thalamus     Clinical Impressions At baseline, prior to CVA in August 2025, pt was Ind with ADLs, IADLs, functional mobility without an AD, drove, and worked. Since CVA, pt has been largely Ind to Sup with ADLs, Ind to Mod I with functional mobility, and has been receiving assistance for IADLs PRN. Pt now presents with decreased activity tolerance, decreased cognition, decreased balance, generalized B UE weakness, decreased B UE coordination, and decreased safety and independence with functional tasks. Pt currently demonstrates ability to largely complete ADLs with Set up to Max assist, bed mobility with Supervision to Contact guard assist, and functional transfers/mobility with a RW with Min assist of +1 to +2. Pt participated well in session, is motivated to return to PLOF, and has good family support. Pt will benefit from acute skilled OT services to address deficits outlined below and increase safety and independence with functional tasks. Post acute discharge, pt will benefit from intensive inpatient skilled rehab services > 3 hours per day to maximize rehab potential.      If plan is discharge home, recommend the following:   Two people to help with walking and/or transfers;A lot of help with bathing/dressing/bathroom;Assistance with cooking/housework;Direct supervision/assist for medications management;Direct supervision/assist for financial management;Assist  for transportation;Help with stairs or ramp for entrance     Functional Status Assessment   Patient has had a recent decline in their functional status and demonstrates the ability to make significant improvements in function in a reasonable and predictable amount of time.     Equipment Recommendations   Other (comment) (TBD based on pt progress)     Recommendations for Other Services   Rehab consult     Precautions/Restrictions   Precautions Precautions: Fall Restrictions Weight Bearing Restrictions Per Provider Order: No     Mobility Bed Mobility Overal bed mobility: Needs Assistance Bed Mobility: Rolling, Supine to Sit, Sit to Supine Rolling: Supervision   Supine to sit: Contact guard Sit to supine: Supervision   General bed mobility comments: CGA to manage trunk and R LE into upright sitting; otherwise Supervision with increased time and effort    Transfers Overall transfer level: Needs assistance Equipment used: Rolling walker (2 wheels) Transfers: Sit to/from Stand, Bed to chair/wheelchair/BSC Sit to Stand: Min assist, From elevated surface     Step pivot transfers: Min assist, From elevated surface     General transfer comment: Min assist to power up and to maintain balance in standing/stepping; pt requires increased time      Balance Overall balance assessment: Needs assistance Sitting-balance support: Single extremity supported, No upper extremity supported, Feet supported Sitting balance-Leahy Scale: Fair Sitting balance - Comments: able to sit at EOB with Supervision   Standing balance support: Bilateral upper extremity supported, During functional activity, Reliant on assistive device for balance Standing balance-Leahy Scale: Poor Standing balance comment: Reliant on B UE support and assist of therapist for baalnce  ADL either performed or assessed with clinical judgement   ADL Overall ADL's : Needs  assistance/impaired Eating/Feeding: Set up;Sitting   Grooming: Set up;Sitting   Upper Body Bathing: Minimal assistance;Sitting   Lower Body Bathing: Maximal assistance;Sit to/from stand;Cueing for compensatory techniques   Upper Body Dressing : Minimal assistance;Sitting   Lower Body Dressing: Maximal assistance;Sit to/from stand   Toilet Transfer: Minimal assistance;BSC/3in1;Rolling walker (2 wheels) (step-pivot; from elevated surface) Toilet Transfer Details (indicate cue type and reason): simulated at EOB Toileting- Clothing Manipulation and Hygiene: Maximal assistance;Sit to/from stand       Functional mobility during ADLs: Minimal assistance;Rolling walker (2 wheels);+2 for safety/equipment (pt ambulated approximately 10' in room with RW with increased time and Min assist for balance throughout. Recommend +2 for further distances for safety.) General ADL Comments: Pt with decreased activity tolerance and requiring increased time and effort for all tasks     Vision Baseline Vision/History: 1 Wears glasses Ability to See in Adequate Light: 0 Adequate Patient Visual Report: No change from baseline Vision Assessment?: No apparent visual deficits Additional Comments: Vision [facility] for task assessed; not formally screened or assessed     Perception         Praxis         Pertinent Vitals/Pain Pain Assessment Pain Assessment: 0-10 Pain Score: 5  Pain Location: R groin Pain Descriptors / Indicators: Stabbing, Grimacing, Guarding Pain Intervention(s): Limited activity within patient's tolerance, Monitored during session, Repositioned     Extremity/Trunk Assessment Upper Extremity Assessment Upper Extremity Assessment: Right hand dominant;Generalized weakness;RUE deficits/detail;LUE deficits/detail RUE Deficits / Details: generalized weakness; mildly decreased coordination LUE Deficits / Details: generalized weakness; mildly decreased coordination   Lower Extremity  Assessment Lower Extremity Assessment: Defer to PT evaluation   Cervical / Trunk Assessment Cervical / Trunk Assessment: Kyphotic   Communication Communication Communication: Impaired Factors Affecting Communication: Reduced clarity of speech;Difficulty expressing self (occasional slurred speech; occasional difficulty with word finding)   Cognition Arousal: Alert Behavior During Therapy: WFL for tasks assessed/performed Cognition: Cognition impaired     Awareness: Intellectual awareness intact, Online awareness intact Memory impairment (select all impairments): Short-term memory Attention impairment (select first level of impairment): Selective attention (easily internally distracted) Executive functioning impairment (select all impairments): Organization, Problem solving OT - Cognition Comments: AAOx4 and pleasant throughout session. Congition not formally screened but with deficits noted above and with pt reported he has noticed ongoing cognitive deficits since CVA in August 2025. Pt sometimes tangentiale with conversation and requiring redirection.                 Following commands: Intact Following commands impaired: Follows multi-step commands with increased time     Cueing  General Comments   Cueing Techniques: Verbal cues  VSS on RA; RN and NT each presetn during a portion of session   Exercises     Shoulder Instructions      Home Living Family/patient expects to be discharged to:: Private residence Living Arrangements: Parent;Other relatives (mother and step-father) Available Help at Discharge: Family;Available 24 hours/day Type of Home: House Home Access: Stairs to enter [employer] of Steps: 1 Entrance Stairs-Rails: None Home Layout: One level     Bathroom Shower/Tub: Producer, television/film/video: Handicapped height     Home Equipment: Cane - quad   Additional Comments: Pt currently staying with mother and step-father who can  provide 24/7 assist with meals, transportatoon, medication assistances, etc, but are unable to provide much physical assistance. Pt's fiance is  also able to provide PRN assist.      Prior Functioning/Environment Prior Level of Function : Independent/Modified Independent             Mobility Comments: Prior to CVA in August 2025, pt was Ind without an AD. Since CVA, pt has been performing functional mobility Ind to Mod I until a couple of days ago when he became unable to walk. 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 Since CVA, pt has largely been completing ADLs Ind to Supervision with PRN assist for IADLs.    OT Problem List: Decreased strength;Decreased activity tolerance;Impaired balance (sitting and/or standing);Decreased coordination;Decreased cognition   OT Treatment/Interventions: Self-care/ADL training;Therapeutic exercise;Energy conservation;DME and/or AE instruction;Therapeutic activities;Cognitive remediation/compensation;Patient/family education;Balance training      OT Goals(Current goals can be found in the care plan section)   Acute Rehab OT Goals Patient Stated Goal: to return home and be able to return to work OT Goal Formulation: With patient Time For Goal Achievement: 06/30/24 Potential to Achieve Goals: Good ADL Goals Pt Will Perform Grooming: with contact guard assist;standing Pt Will Perform Lower Body Bathing: with contact guard assist;sit to/from stand Pt Will Perform Lower Body Dressing: with contact guard assist;sit to/from stand Pt Will Transfer to Toilet: with supervision;ambulating;regular height toilet;grab bars (with least restrictive AD) Pt Will Perform Toileting - Clothing Manipulation and hygiene: with supervision;sit to/from stand;sitting/lateral leans   OT Frequency:  Min 2X/week    Co-evaluation               AM-PAC OT 6 Clicks Daily Activity     Outcome Measure Help from another person eating meals?: A Little Help from another person taking care of personal grooming?: A Little Help from another person toileting, which includes using toliet, bedpan, or urinal?: A Lot Help from another person bathing (including washing, rinsing, drying)?: A Lot Help from another person to put on and taking off regular upper body clothing?: A Little Help from another person to put on and taking off regular lower body clothing?: A Lot 6 Click Score: 15   End of Session Equipment Utilized During Treatment: Gait belt;Rolling walker (2 wheels) Nurse Communication: Mobility status  Activity Tolerance: Patient tolerated treatment well Patient left: in bed;with call bell/phone within reach;with bed alarm set  OT Visit Diagnosis: Unsteadiness on feet (R26.81);Other abnormalities of gait and mobility (R26.89);Muscle weakness (generalized) (M62.81);Ataxia, unspecified (R27.0);Other symptoms and signs involving cognitive function                Time: 1559-1656 OT Time Calculation (min): 57 min Charges:  OT General Charges $OT Visit: 1 Visit OT Evaluation $OT Eval Moderate Complexity: 1 Mod OT Treatments $Self Care/Home Management : 23-37 mins  Margarie Rockey HERO., OTR/L, MA Acute Rehab [phone]   Margarie FORBES Horns 06/16/2024, 6:08 PM

## 2024-06-16 NOTE — Hospital Course (Addendum)
 Edward [name] is a 53 y.o.male with PMHx ankylosing spondylitis, CKDV, CVA, HTN, T2DM and, HLD, bipolar 1 and migraines presenting with back pain in the setting of flare of ankylosing spondylitis. Patient underwent renal biopsy on 06/22/24 w/ IR and initiated on HD per nephrology. His hospitalization became complicated by urinary retention, hematuria, and perinephric hematoma which required further IR involvement for embolization. His hospital course is detailed below:  Ankylosing spondylitis  Initially presented with severe pain down R leg limiting ability to stand. MRI sacrum demonstrated sacroiliitis.  Patient received Tylenol , oxycodone , dilaudid , tizanidine , and lidocaine  patches with improvement. Patient started on Prednisone  taper during his hospitalization which he completed on 06/28/24.  L Perinephric hematoma Gross hematuria Urinary retention Patient underwent renal biopsy on 9/11 which became further complicated by urinary retention, hematuria, and was found to have a perinephric hematoma on CT s/p L renal biopsy.  H&H remained stable during admission.  Foley was required during this hospitalization.  IR performed a L renal artery angiography and embolization on 9/18, with successful postembolization hemostasis.  Foley was removed on 9/19, void trial was successful with no persistent hematuria.  ASA was restarted on 9/19 for stroke prevention, along with heparin , H&H stable prior to discharge.  Urology to follow patient outpatient.  CKD5 Rapidly progressive CKD.  Nephrology consulted who recommended renal biopsy while hospitalized.  Following ASA washout, renal biopsy completed 9/11, with pending pathology result, restarted ASA 81 mg on 9/19.  Starting HD on 9/12, underwent left brachiocephalic arteriovenous fistula placement  with vascular surgery on 9/18.  Nephrology recommended decreasing amlodipine  to 5 mg daily, and recommended close attention at HD follow-up to see if this can be  discontinued.  Outpatient HD was established at [facility] to occur on Tu/Th/Sat.  Other chronic conditions were medically managed with home medications and formulary alternatives as necessary (HTN, CVA, T2DM, HLD, BP1D)  PCP Follow-up Recommendations: Ensure outpatient Rheumatology FU. Continue and adjust pain management for ankylosing spondylitis. Vascular outpatient follow up in 4-6 weeks for left arm fistula duplex.  Ensure urology follow-up outpatient. Reassess amlodipine  per nephro recs.

## 2024-06-16 NOTE — ED Provider Notes (Signed)
 MRI sacrum not yet read.  IMPRESSION: 1. L4-5: Increased size of right subarticular disc extrusion with minimal inferior migration and narrowing of the right lateral recess. 2. L5-S1: Unchanged small central disc protrusion without central spinal canal stenosis. Unchanged moderate bilateral neuroforaminal stenosis.  Patient given IV steroids.  Given his inability to walk with severe pain will plan admission. Discussed with family practice residents.   Carita Senior, MD 06/16/24 [phone]

## 2024-06-16 NOTE — Assessment & Plan Note (Addendum)
 Not on dialysis.  Oliguric at baseline.  Likely secondary to nephrotic syndrome per Nephrology, pending OP kidney biopsy and may consider kidney transplant. Cr worse from prior, new baseline around 4-4.7. - Renal diet - Strict I's and O's - Consider Nephrology consultation if continues to acutely worsen

## 2024-06-16 NOTE — Progress Notes (Signed)
 RE:  Edward [name]       Date of Birth:  [birth year]     Date:  06/16/24        To Whom It May Concern:  Please be advised that the above-named patient will require a short-term nursing home stay - anticipated 30 days or less for rehabilitation and strengthening.  The plan is for return home.                 MD signature                Date

## 2024-06-16 NOTE — Progress Notes (Signed)
   06/16/24 2014  BiPAP/CPAP/SIPAP  Reason BIPAP/CPAP not in use Other(comment) (Pt states he does not wear CPAP.)

## 2024-06-17 ENCOUNTER — Ambulatory Visit: Payer: PRIVATE HEALTH INSURANCE | Admitting: Family Medicine

## 2024-06-17 DIAGNOSIS — K5900 Constipation, unspecified: Secondary | ICD-10-CM | POA: Diagnosis present

## 2024-06-17 DIAGNOSIS — M549 Dorsalgia, unspecified: Secondary | ICD-10-CM | POA: Diagnosis not present

## 2024-06-17 DIAGNOSIS — E875 Hyperkalemia: Secondary | ICD-10-CM | POA: Diagnosis present

## 2024-06-17 LAB — CBC
HCT: 32.9 % — ABNORMAL LOW (ref 39.0–52.0)
Hemoglobin: 10.5 g/dL — ABNORMAL LOW (ref 13.0–17.0)
MCH: 28.1 pg (ref 26.0–34.0)
MCHC: 31.9 g/dL (ref 30.0–36.0)
MCV: 88 fL (ref 80.0–100.0)
Platelets: 440 K/uL — ABNORMAL HIGH (ref 150–400)
RBC: 3.74 MIL/uL — ABNORMAL LOW (ref 4.22–5.81)
RDW: 15.6 % — ABNORMAL HIGH (ref 11.5–15.5)
WBC: 15.4 K/uL — ABNORMAL HIGH (ref 4.0–10.5)
nRBC: 0 % (ref 0.0–0.2)

## 2024-06-17 LAB — BASIC METABOLIC PANEL WITH GFR
Anion gap: 16 — ABNORMAL HIGH (ref 5–15)
BUN: 101 mg/dL — ABNORMAL HIGH (ref 6–20)
CO2: 16 mmol/L — ABNORMAL LOW (ref 22–32)
Calcium: 8.8 mg/dL — ABNORMAL LOW (ref 8.9–10.3)
Chloride: 107 mmol/L (ref 98–111)
Creatinine, Ser: 5.69 mg/dL — ABNORMAL HIGH (ref 0.61–1.24)
GFR, Estimated: 11 mL/min — ABNORMAL LOW (ref >60)
Glucose, Bld: 182 mg/dL — ABNORMAL HIGH (ref 70–99)
Potassium: 5.3 mmol/L — ABNORMAL HIGH (ref 3.5–5.1)
Sodium: 139 mmol/L (ref 135–145)

## 2024-06-17 LAB — GLUCOSE, CAPILLARY
Glucose-Capillary: 153 mg/dL — ABNORMAL HIGH (ref 70–99)
Glucose-Capillary: 176 mg/dL — ABNORMAL HIGH (ref 70–99)
Glucose-Capillary: 241 mg/dL — ABNORMAL HIGH (ref 70–99)
Glucose-Capillary: 284 mg/dL — ABNORMAL HIGH (ref 70–99)
Glucose-Capillary: 302 mg/dL — ABNORMAL HIGH (ref 70–99)

## 2024-06-17 MED ORDER — SODIUM ZIRCONIUM CYCLOSILICATE 10 G PO PACK
10.0000 g | PACK | Freq: Once | ORAL | Status: AC
  Administered 2024-06-17: 10 g via ORAL
  Filled 2024-06-17: qty 1

## 2024-06-17 MED ORDER — SODIUM BICARBONATE 650 MG PO TABS
650.0000 mg | ORAL_TABLET | Freq: Two times a day (BID) | ORAL | Status: DC
  Administered 2024-06-17 – 2024-06-19 (×5): 650 mg via ORAL
  Filled 2024-06-17 (×5): qty 1

## 2024-06-17 MED ORDER — SENNA 8.6 MG PO TABS
2.0000 | ORAL_TABLET | Freq: Every day | ORAL | Status: DC
  Administered 2024-06-18 – 2024-06-28 (×10): 17.2 mg via ORAL
  Filled 2024-06-17 (×11): qty 2

## 2024-06-17 NOTE — Assessment & Plan Note (Addendum)
 Known Ankylosing Spondylitis, likely in flare currently but unable to take NSAID due to CKD. Not able to use  biologics treatment d/t possible kidney transplant candidate. MRI on 9/4 shows sacroiliitis related to ankylosing spondylitis.  - Starting Prednisone  60 mg taper dose today (06/17/24) for sacroiliitis  - Need outpatient f/u with Rheumatology  - PT/OT : recommending [facility]  - Fall precautions - Daily CBC

## 2024-06-17 NOTE — Plan of Care (Signed)
   Problem: Education: Goal: Knowledge of General Education information will improve Description: Including pain rating scale, medication(s)/side effects and non-pharmacologic comfort measures Outcome: Progressing   Problem: Activity: Goal: Risk for activity intolerance will decrease Outcome: Progressing

## 2024-06-17 NOTE — TOC Progression Note (Signed)
 Transition of Care [facility]) - Progression Note    Patient Details  Name: Edward [name] MRN: [id number] Date of Birth: Dec 16, 1970  Transition of Care [facility]) CM/SW Contact  Bridget Cordella Simmonds, LCSW Phone Number: 06/17/2024, 10:49 AM  Clinical Narrative:  No current bed offers, 3 SNFs are considering bed offer depending on if they could get a single case agreement with insurance.  These options were provided to pt and mother Diane on medicare choice document. Pt is asking about CIR, reached out to that team to review as well.        Expected Discharge Plan: Skilled Nursing Facility Barriers to Discharge: Continued Medical Work up, SNF Pending bed offer               Expected Discharge Plan and Services In-house Referral: Clinical Social Work   Post Acute Care Choice: Skilled Nursing Facility Living arrangements for the past 2 months: Single Family Home                                       Social Drivers of Health (SDOH) Interventions SDOH Screenings   Food Insecurity: No Food Insecurity (06/16/2024)  Housing: Low Risk  (06/16/2024)  Transportation Needs: No Transportation Needs (06/16/2024)  Utilities: Not At Risk (06/16/2024)  Alcohol Screen: Low Risk  (03/20/2024)  Depression (PHQ2-9): High Risk (06/03/2024)  Financial Resource Strain: Patient Declined (03/20/2024)  Physical Activity: Insufficiently Active (03/20/2024)  Social Connections: Socially Isolated (06/16/2024)  Stress: Stress Concern Present (03/20/2024)  Tobacco Use: Medium Risk (06/03/2024)    Readmission Risk Interventions     No data to display

## 2024-06-17 NOTE — Progress Notes (Addendum)
 Daily Progress Note Intern Pager: [phone]  Patient name: Edward [name] Medical record number: [id number] Date of birth: [birth year] Age: 53 y.o. Gender: male  Primary Care Provider: Donzetta Rollene BRAVO, MD Consultants: Nephrology, Dietitian  Code Status: Full  Pt Overview and Major Events to Date:  9/4 : Admitted for back pain   Pertinent PMH/PSH includes Ankylosing spondylitis Chronic kidney disease, stage 3b Chronic pain Stroke Gastric Bypass (07/2015) Obesity HTN DMII HLD Bipolar 1 disorder Migraines OCD Rheumatic fever at age 20   Assessment & Plan Ankylosing spondylitis, unspecified site of spine (HCC) Known Ankylosing Spondylitis, likely in flare currently but unable to take NSAID due to CKD. Not able to use  biologics treatment d/t possible kidney transplant candidate. MRI on 9/4 shows sacroiliitis related to ankylosing spondylitis.  - Starting Prednisone  60 mg taper dose today (06/17/24) for sacroiliitis  - Need outpatient f/u with Rheumatology  - PT/OT : recommending [facility]  - Fall precautions - Daily CBC Chronic pain - Acetaminophen  650 mg every 8 hours scheduled - Oxycodone  5 mg every 4 hours as needed for moderate pain - Oxycodone  10 mg every 4 hours as needed for severe pain - Dilaudid  0.5 mg IV every 6 hours as needed for breakthrough pain - Lidocaine  patches - Tizanidine  4 mg every 8 hours as needed CKD (chronic kidney disease), stage V (HCC) Patient has good urine output since admission. He is Oliguric at baseline. Nephrology follow as outpatient since he was in [place] -likely secondary to nephrotic syndrome. He was recommended to start dialysis but has not been done so and may consider kidney transplant. Cr worse from prior, new baseline around 4-4.7. - Nephrology consultation, appreciate recommendations - Renal diet with 1200 ml fluid restriction  - Strict I's and O's - Daily RFP  - Pending OP kidney biopsy - will need ASA 81 mg to be hold for 5 days  before Kidney biopsy  Hyperkalemia - Lokelmia 10 mg once  - Repeat RPF in AM Constipation Last BM was 06/11/24 MiraLax  17 g BID Senna 17.2 mg Daily Chronic health problem HTN : Continue home amLODipine  10 mg daily, Carrvedilol 3.125 mg BID Hx of Stroke : Continue ASA 81 mg daily  DMII : SSI HLD : Continue Ezetimibe  10 mg daily Bipolar 1 disorder : Continue QUEtiapine  25 mg QHS Migraines OCD  FEN/GI: Renal diet , Protonix  EC 40 mg daily PPx: SQH Dispo:SNF pending clinical improvement .  Subjective:   Edward [name] is a 53 y.o. male presenting with back pain . He starts his back pain has been significantly improved. OOB w/ assistant. Still w/ no BM. Pt states today that he would like to go to [facility] or any facility on [facility] after discharge.   Objective: Temp:  [97.7 F (36.5 C)-98 F (36.7 C)] 97.9 F (36.6 C) (09/05 0739) Pulse Rate:  [73-83] 81 (09/05 0739) Resp:  [16-18] 17 (09/05 0739) BP: (111-131)/(67-76) 131/76 (09/05 0739) SpO2:  [93 %-99 %] 99 % (09/05 0739)  Physical Exam Cardiovascular:     Rate and Rhythm: Normal rate.     Pulses: Normal pulses.  Pulmonary:     Effort: Pulmonary effort is normal.     Breath sounds: Normal breath sounds.  Abdominal:     Palpations: Abdomen is soft.  Musculoskeletal:        General: Tenderness (back) present.  Skin:    General: Skin is warm and dry.     Capillary Refill: Capillary refill takes less  than 2 seconds.  Neurological:     Mental Status: He is oriented to person, place, and time. Mental status is at baseline.     Sensory: Sensory deficit (residual right sensory changes on R arm and leg,) present.  Psychiatric:        Mood and Affect: Mood normal.        Behavior: Behavior normal.   Laboratory: Most recent CBC Lab Results  Component Value Date   WBC 15.4 (H) 06/17/2024   HGB 10.5 (L) 06/17/2024   HCT 32.9 (L) 06/17/2024   MCV 88.0 06/17/2024   PLT 440 (H) 06/17/2024   Most recent BMP     Latest Ref Rng & Units 06/17/2024    6:42 AM  BMP  Glucose 70 - 99 mg/dL 817   BUN 6 - 20 mg/dL 898   Creatinine 9.38 - 1.24 mg/dL 4.30   Sodium 864 - 854 mmol/L 139   Potassium 3.5 - 5.1 mmol/L 5.3   Chloride 98 - 111 mmol/L 107   CO2 22 - 32 mmol/L 16   Calcium  8.9 - 10.3 mg/dL 8.8     Imaging/Diagnostic Tests: EXAM: MRI SACRUM WITHOUT CONTRAST IMPRESSION: 1. Symmetric marrow edema along the sacroiliac joints with suspected erosions, favoring sacroiliitis related to ankylosing spondylitis. 2. Tendinopathy and potentially mild partial tearing of the right proximal hamstring tendon. Minimal tendinopathy of the left proximal hamstring tendon. 3. Trace edema in the bilateral hip adductor musculature adjacent to the pubis. 4. Subtle endplate edema at O5-4 and L5-S1.  Suzen Houston NOVAK, DO 06/17/2024, 8:54 AM  PGY-1, [facility] FPTS Intern pager: [phone], text pages welcome Secure chat group [facility] Teaching Service

## 2024-06-17 NOTE — Assessment & Plan Note (Signed)
 HTN : Continue home amLODipine  10 mg daily, Carrvedilol 3.125 mg BID Hx of Stroke : Continue ASA 81 mg daily  DMII : SSI HLD : Continue Ezetimibe  10 mg daily Bipolar 1 disorder : Continue QUEtiapine  25 mg QHS Migraines OCD

## 2024-06-17 NOTE — Consult Note (Signed)
 Nephrology Consult   Reason for consult: CKD 5  Assessment/Recommendations:   CKD5 -followed by Dr. Melia outpatient. Rapidly progressive CKD, open for biopsy at the end of the month. Given more prolonged hospital course, let's plan for a renal biopsy while he's here. Will defer to primary service to hold aspirin  given his recent CVA. Will consult IR for renal biopsy next week. Will place biopsy req form in front of chart -biopsy will help determine any reversibility and degree of chronicity. He would still like to move forward with this -renal replacement therapy: not indicated-not exhibiting any uremic signs/symptoms nor have issues with volume overload currently. Hoping to avoid starting him on dialysis as an inpatient. If needing to start while he's here, then would advocate for a TDC only as I believe he is an excellent candidate for home therapies. We discussed this in great detail and he is in agreement. Revisit renal transplant discussions outpatient -will continue to follow -Avoid nephrotoxic medications including NSAIDs and iodinated intravenous contrast exposure unless the latter is absolutely indicated.  Preferred narcotic agents for pain control are hydromorphone , fentanyl , and methadone. Morphine  should not be used. Avoid Baclofen and avoid oral sodium phosphate  and magnesium citrate based laxatives / bowel preps. Continue strict Input and Output monitoring. Will monitor the patient closely with you and intervene or adjust therapy as indicated by changes in clinical status/labs   Ankylosing spondylitis - With flare - Per primary, on prednisone   Hyperkalemia - Renal diet - Agree with Lokelma , can dose as needed  Acidosis - Given his hyperkalemia, will start sodium bicarb 650 twice daily  History of ischemic stroke - Recent admission, was discharged on 8/13.  Status post DAPT, plan per discharge summary was to keep aspirin  on until renal biopsy.  Discussed with primary service,  they will hold aspirin  (last dose today)  Hypertension - BP currently acceptable on amlodipine  and carvedilol   Anemia of CKD - Transfuse for hemoglobin less than 7 - Checking iron studies  Diabetes Mellitus Type 2 with Hyperglycemia -per primary  Recommendations conveyed to primary service.   Ephriam Stank [facility] 06/17/2024 2:12 PM   _____________________________________________________________________________________   History of Present Illness: Edward [name] is a/an 53 y.o. male with a past medical history of advanced CKD, DM2, hypertension, ankylosing spondylitis, history of perforated gastric ulcer in May 2025 who presents with back and leg pain.  Being treated for ankylosing spondylitis, on prednisone .  Functionally, he is doing better today, still with some pain.  Did receive Lokelma  10 g for K of 5.3.  He otherwise reports being in his usual state of health.  Denies any fevers, chills, chest pain, shortness of breath, diminished urinary frequency, swelling, nausea/vomiting/dysgeusia/loss of appetite, new shakes/tremors, brain fog.  He is open to starting on dialysis and renal biopsy if needed.  Followed by Dr. Melia at our office.   Medications:  Current Facility-Administered Medications  Medication Dose Route Frequency Provider Last Rate Last Admin   acetaminophen  (TYLENOL ) tablet 650 mg  650 mg Oral Q8H Theophilus Pagan, MD   650 mg at 06/17/24 1339   amLODipine  (NORVASC ) tablet 10 mg  10 mg Oral Daily Theophilus Pagan, MD   10 mg at 06/17/24 9096   aspirin  EC tablet 81 mg  81 mg Oral Daily Theophilus Pagan, MD   81 mg at 06/17/24 9095   atorvastatin  (LIPITOR) tablet 40 mg  40 mg Oral Daily Theophilus Pagan, MD   40 mg at 06/17/24 9096   carvedilol  (COREG )  tablet 3.125 mg  3.125 mg Oral BID Theophilus Pagan, MD   3.125 mg at 06/17/24 9096   ezetimibe  (ZETIA ) tablet 10 mg  10 mg Oral Daily Theophilus Pagan, MD   10 mg at 06/17/24 9096   heparin  injection  5,000 Units  5,000 Units Subcutaneous Q8H Theophilus Pagan, MD   5,000 Units at 06/17/24 1340   HYDROmorphone  (DILAUDID ) injection 0.5 mg  0.5 mg Intravenous Q6H PRN Theophilus Pagan, MD   0.5 mg at 06/16/24 0155   insulin  aspart (novoLOG ) injection 0-6 Units  0-6 Units Subcutaneous TID WC Theophilus Pagan, MD   2 Units at 06/17/24 1139   lidocaine  (LIDODERM ) 5 % 2 patch  2 patch Transdermal Q24H Theophilus Pagan, MD   2 patch at 06/17/24 0157   oxyCODONE  (Oxy IR/ROXICODONE ) immediate release tablet 5 mg  5 mg Oral Q4H PRN Theophilus Pagan, MD   5 mg at 06/16/24 1136   Or   oxyCODONE  (Oxy IR/ROXICODONE ) immediate release tablet 10 mg  10 mg Oral Q4H PRN Theophilus Pagan, MD   10 mg at 06/17/24 1351   pantoprazole  (PROTONIX ) EC tablet 40 mg  40 mg Oral Daily Theophilus Pagan, MD   40 mg at 06/17/24 0903   polyethylene glycol (MIRALAX  / GLYCOLAX ) packet 17 g  17 g Oral BID Suknaim, Kulkaew B, DO   17 g at 06/17/24 9097   predniSONE  (DELTASONE ) tablet 60 mg  60 mg Oral Q breakfast Tharon Lung, MD   60 mg at 06/17/24 0845   Followed by   NOREEN ON 06/19/2024] predniSONE  (DELTASONE ) tablet 50 mg  50 mg Oral Q breakfast Tharon Lung, MD       Followed by   NOREEN ON 06/21/2024] predniSONE  (DELTASONE ) tablet 40 mg  40 mg Oral Q breakfast Tharon Lung, MD       Followed by   NOREEN ON 06/23/2024] predniSONE  (DELTASONE ) tablet 30 mg  30 mg Oral Q breakfast Tharon Lung, MD       Followed by   NOREEN ON 06/25/2024] predniSONE  (DELTASONE ) tablet 20 mg  20 mg Oral Q breakfast Tharon Lung, MD       Followed by   NOREEN ON 06/27/2024] predniSONE  (DELTASONE ) tablet 10 mg  10 mg Oral Q breakfast Tharon Lung, MD       QUEtiapine  (SEROQUEL ) tablet 25 mg  25 mg Oral QHS Theophilus Pagan, MD   25 mg at 06/16/24 2123   [START ON 06/18/2024] senna (SENOKOT) tablet 17.2 mg  2 tablet Oral Daily Suknaim, Kulkaew B, DO       tiZANidine  (ZANAFLEX ) tablet 4 mg  4 mg Oral Q8H PRN Theophilus Pagan, MD          ALLERGIES Demerol [meperidine], Nsaids, Tapentadol, and Tramadol   MEDICAL HISTORY Past Medical History:  Diagnosis Date   Renal disorder    CKD stage 4   Rheumatic fever age 2     SOCIAL HISTORY Social History   Socioeconomic History   Marital status: Married    Spouse name: Not on file   Number of children: Not on file   Years of education: Not on file   Highest education level: Doctorate  Occupational History   Not on file  Tobacco Use   Smoking status: Former    Current packs/day: 0.00    Average packs/day: 0.3 packs/day for 10.0 years (2.5 ttl pk-yrs)    Types: Cigarettes    Start date: 04/16/2002    Quit date: 04/16/2012    Years since  quitting: 12.1   Smokeless tobacco: Never  Vaping Use   Vaping status: Never Used  Substance and Sexual Activity   Alcohol use: No    Alcohol/week: 0.0 standard drinks of alcohol   Drug use: No   Sexual activity: Yes    Partners: Female  Other Topics Concern   Not on file  Social History Narrative   02/22/16   Married: Damien Like   Children: Rosa (anxiety/depression), [name] (Autism, non-verbal), Wilbern Credit   Education: PhD in [employer] Psychology   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   Social Drivers of Health   Financial Resource Strain: Patient Declined (03/20/2024)   Overall Financial Resource Strain (CARDIA)    Difficulty of Paying Living Expenses: Patient declined  Food Insecurity: No Food Insecurity (06/16/2024)   Hunger Vital Sign    Worried About Running Out of Food in the Last Year: Never true    Ran Out of Food in the Last Year: Never true  Transportation Needs: No Transportation Needs (06/16/2024)   PRAPARE - Administrator, Civil Service (Medical): No    Lack of Transportation (Non-Medical): No  Physical Activity: Insufficiently Active (03/20/2024)   Exercise Vital Sign    Days of Exercise per Week: 2 days    Minutes of Exercise per Session: 20 min  Stress: Stress Concern Present  (03/20/2024)   [employer] of Occupational Health - Occupational Stress Questionnaire    Feeling of Stress : Rather much  Social Connections: Socially Isolated (06/16/2024)   Social Connection and Isolation Panel    Frequency of Communication with Friends and Family: Three times a week    Frequency of Social Gatherings with Friends and Family: Twice a week    Attends Religious Services: Never    Database administrator or Organizations: No    Attends Banker Meetings: Never    Marital Status: Divorced  Catering manager Violence: Not At Risk (06/16/2024)   Humiliation, Afraid, Rape, and Kick questionnaire    Fear of Current or Ex-Partner: No    Emotionally Abused: No    Physically Abused: No    Sexually Abused: No     FAMILY HISTORY Family History  Problem Relation Age of Onset   Hyperlipidemia Mother    Hypertension Mother    Breast cancer Mother    Thyroid  disease Father    Early death Father 1       Cancer   Diabetes Father    Autoimmune disease Father        Ankylosing Spondylitis   Squamous cell carcinoma Father    Diabetes Brother    Hyperlipidemia Brother    Hypertension Brother    Thyroid  disease Brother    Alcohol abuse Brother    Heart disease Maternal Grandmother    Stroke Maternal Grandmother    Heart disease Maternal Grandfather    Autoimmune disease Paternal Grandfather     Review of Systems: 12 systems reviewed Otherwise as per HPI, all other systems reviewed and negative  Physical Exam: Vitals:   06/17/24 0500 06/17/24 0739  BP: 111/69 131/76  Pulse: 77 81  Resp: 18 17  Temp: 98 F (36.7 C) 97.9 F (36.6 C)  SpO2: 97% 99%   Total I/O In: 360 [P.O.:360] Out: 300 [Urine:300]  Intake/Output Summary (Last 24 hours) at 06/17/2024 1412 Last data filed at 06/17/2024 1137 Gross per 24 hour  Intake 360 ml  Output 300 ml  Net 60 ml   General: well-appearing, no acute  distress HEENT: anicteric sclera, oropharynx clear without  lesions CV: regular rate, normal rhythm, no murmurs, no gallops, no rubs, no peripheral edema Lungs: clear to auscultation bilaterally, normal work of breathing Abd: soft, non-tender, non-distended Skin: no visible lesions or rashes Psych: alert, engaged, appropriate mood and affect Musculoskeletal: no obvious deformities Neuro: normal speech, no gross focal deficits   Test Results Reviewed Lab Results  Component Value Date   NA 139 06/17/2024   K 5.3 (H) 06/17/2024   CL 107 06/17/2024   CO2 16 (L) 06/17/2024   BUN 101 (H) 06/17/2024   CREATININE 5.69 (H) 06/17/2024   CALCIUM  8.8 (L) 06/17/2024   ALBUMIN 2.3 (L) 06/15/2024   PHOS 5.4 (H) 05/25/2024     I have reviewed all relevant outside healthcare records related to the patient's kidney injury.

## 2024-06-17 NOTE — Progress Notes (Signed)
 Inpatient Rehab Admissions Coordinator Note:   Per OT recommendations patient was screened for CIR candidacy by Reche FORBES Lowers, PT. At this time, pt appears to be a potential candidate for CIR. I will place an order for rehab consult for full assessment, per our protocol.  Please contact me any with questions.SABRA Reche Lowers, PT, DPT [phone] 06/17/24 10:50 AM

## 2024-06-17 NOTE — Assessment & Plan Note (Signed)
 Last BM was 06/11/24 MiraLax  17 g BID Senna 17.2 mg Daily

## 2024-06-17 NOTE — Assessment & Plan Note (Addendum)
 Patient has good urine output since admission. He is Oliguric at baseline. Nephrology follow as outpatient since he was in [place] -likely secondary to nephrotic syndrome. He was recommended to start dialysis but has not been done so and may consider kidney transplant. Cr worse from prior, new baseline around 4-4.7. - Nephrology consultation, appreciate recommendations - Renal diet with 1200 ml fluid restriction  - Strict I's and O's - Daily RFP  - Pending OP kidney biopsy - will need ASA 81 mg to be hold for 5 days before Kidney biopsy

## 2024-06-17 NOTE — Assessment & Plan Note (Signed)
-   Acetaminophen  650 mg every 8 hours scheduled - Oxycodone  5 mg every 4 hours as needed for moderate pain - Oxycodone  10 mg every 4 hours as needed for severe pain - Dilaudid  0.5 mg IV every 6 hours as needed for breakthrough pain - Lidocaine  patches - Tizanidine  4 mg every 8 hours as needed

## 2024-06-17 NOTE — Plan of Care (Signed)

## 2024-06-17 NOTE — Assessment & Plan Note (Signed)
-   Lokelmia 10 mg once  - Repeat RPF in AM

## 2024-06-18 ENCOUNTER — Encounter (HOSPITAL_COMMUNITY): Payer: Self-pay | Admitting: Family Medicine

## 2024-06-18 DIAGNOSIS — M450 Ankylosing spondylitis of multiple sites in spine: Secondary | ICD-10-CM | POA: Diagnosis not present

## 2024-06-18 LAB — RENAL FUNCTION PANEL
Albumin: 2.1 g/dL — ABNORMAL LOW (ref 3.5–5.0)
Anion gap: 14 (ref 5–15)
BUN: 113 mg/dL — ABNORMAL HIGH (ref 6–20)
CO2: 16 mmol/L — ABNORMAL LOW (ref 22–32)
Calcium: 8.4 mg/dL — ABNORMAL LOW (ref 8.9–10.3)
Chloride: 104 mmol/L (ref 98–111)
Creatinine, Ser: 5.59 mg/dL — ABNORMAL HIGH (ref 0.61–1.24)
GFR, Estimated: 11 mL/min — ABNORMAL LOW (ref >60)
Glucose, Bld: 203 mg/dL — ABNORMAL HIGH (ref 70–99)
Phosphorus: 6.1 mg/dL — ABNORMAL HIGH (ref 2.5–4.6)
Potassium: 5.2 mmol/L — ABNORMAL HIGH (ref 3.5–5.1)
Sodium: 134 mmol/L — ABNORMAL LOW (ref 135–145)

## 2024-06-18 LAB — GLUCOSE, CAPILLARY
Glucose-Capillary: 176 mg/dL — ABNORMAL HIGH (ref 70–99)
Glucose-Capillary: 191 mg/dL — ABNORMAL HIGH (ref 70–99)
Glucose-Capillary: 221 mg/dL — ABNORMAL HIGH (ref 70–99)
Glucose-Capillary: 232 mg/dL — ABNORMAL HIGH (ref 70–99)
Glucose-Capillary: 239 mg/dL — ABNORMAL HIGH (ref 70–99)
Glucose-Capillary: 315 mg/dL — ABNORMAL HIGH (ref 70–99)

## 2024-06-18 LAB — CBC
HCT: 32.5 % — ABNORMAL LOW (ref 39.0–52.0)
Hemoglobin: 10.2 g/dL — ABNORMAL LOW (ref 13.0–17.0)
MCH: 28 pg (ref 26.0–34.0)
MCHC: 31.4 g/dL (ref 30.0–36.0)
MCV: 89.3 fL (ref 80.0–100.0)
Platelets: 477 K/uL — ABNORMAL HIGH (ref 150–400)
RBC: 3.64 MIL/uL — ABNORMAL LOW (ref 4.22–5.81)
RDW: 15.6 % — ABNORMAL HIGH (ref 11.5–15.5)
WBC: 15 K/uL — ABNORMAL HIGH (ref 4.0–10.5)
nRBC: 0 % (ref 0.0–0.2)

## 2024-06-18 MED ORDER — ENSURE PLUS HIGH PROTEIN PO LIQD
237.0000 mL | Freq: Two times a day (BID) | ORAL | Status: DC
  Administered 2024-06-18 – 2024-06-21 (×7): 237 mL via ORAL

## 2024-06-18 MED ORDER — SODIUM ZIRCONIUM CYCLOSILICATE 10 G PO PACK
10.0000 g | PACK | Freq: Once | ORAL | Status: AC
  Administered 2024-06-18: 10 g via ORAL
  Filled 2024-06-18: qty 1

## 2024-06-18 NOTE — Assessment & Plan Note (Addendum)
 Symptomatically improved, feels stable on PO pain meds and agreeable to d/c IV pain meds.  Continue to mobilize and plan for acute rehab. - Continue prednisone  taper - Pain regimen: - Acetaminophen  650 mg every 8 hours scheduled - Oxycodone  5-10 mg every 4 hours as needed for moderate/severe pain - D/c Dilaudid  0.5 mg IV every 6 hours - Lidocaine  patches - Tizanidine  4 mg every 8 hours as needed - Need outpatient f/u with Rheumatology  - PT/OT : recommending CRI

## 2024-06-18 NOTE — Assessment & Plan Note (Signed)
 HTN : Continue home amLODipine  10 mg daily, Carrvedilol 3.125 mg BID Hx of Stroke : Continue ASA 81 mg daily  DMII : SSI HLD : Continue Ezetimibe  10 mg daily Bipolar 1 disorder : Continue QUEtiapine  25 mg QHS Migraines OCD

## 2024-06-18 NOTE — Assessment & Plan Note (Deleted)
-   Acetaminophen  650 mg every 8 hours scheduled - Oxycodone  5 mg every 4 hours as needed for moderate pain - Oxycodone  10 mg every 4 hours as needed for severe pain - Dilaudid  0.5 mg IV every 6 hours as needed for breakthrough pain - Lidocaine  patches - Tizanidine  4 mg every 8 hours as needed

## 2024-06-18 NOTE — Plan of Care (Signed)
  Problem: Skin Integrity: Goal: Risk for impaired skin integrity will decrease Outcome: Progressing   Problem: Activity: Goal: Risk for activity intolerance will decrease Outcome: Progressing   Problem: Nutrition: Goal: Adequate nutrition will be maintained Outcome: Progressing   

## 2024-06-18 NOTE — Assessment & Plan Note (Addendum)
 Pending AM RFP, will follow up for resolution

## 2024-06-18 NOTE — Progress Notes (Addendum)
 Initial Nutrition Assessment  DOCUMENTATION CODES:   Not applicable  INTERVENTION:   Encourage PO intake  Ensure Plus High Protein po BID (given with lunch and dinner), each supplement provides 350 kcal and 20 grams of protein.  Will need to follow up with CRP, Vitamin B12, and Vitamin D  and develop micronutrient supplement plan with Renal   NUTRITION DIAGNOSIS:   Increased nutrient needs related to acute illness as evidenced by estimated needs.  GOAL:   Patient will meet greater than or equal to 90% of their needs  MONITOR:   PO intake, Supplement acceptance  REASON FOR ASSESSMENT:   Consult Diet education  ASSESSMENT:   Pt with PMH of ankylosing spondylitis, CKD V, CVA, HTN, DM, HTN, Bipolar 1, recent ischemic stroke d/c'ed 8/13, and migraines who had gastric bypass surgery in 2016 admitted for ankylosing spondylitis flare.   Per MD symptoms improving, stopping IV pain meds. Plan for CIR at discharge.  Renal following, noted rapidly progressive CKD with planned biopsy next week. No plans for RRT at this time.   Consult received for patient diet education for renal diet with hx of gastric bypass.  Meal Completion: 50-100% - Lunch today is chicken ceasar salad with roll   Medications reviewed and include: SSI TID with meals, protonix , miralax , prednisone , senokot, 650 mg [place] BID, lokelma  x 1 9/6  Labs reviewed:  Na 134 K 5.0 -> 5.3 -> 5.2 BUN 81 -> 101 -> 131 Cr 5.38 -> 5.69 -> 5.59 Ca 8.4 Phos 2.1 A1C 7.4 (8/25) CBG's: 153-302 CRP: 19.2 (9/4)  UOP: 1050 ml -> 1000 ml in last 12 hours  Admission weight/Current weight: 122.5 kg (269 lb ? Reported) Last admission weight: 125.2 kg (275 lb) Per RN non-pitting edema  Per outpatient RD notes:  Highest weight: 438 lb 1 year post bariatric surgery (2017): 309 lb - total weight loss 128 lb    NUTRITION - FOCUSED PHYSICAL EXAM:  Deferred to follow up  Diet Order:   Diet Order             Diet NPO  time specified Except for: Sips with Meds  Diet effective midnight           Diet renal with fluid restriction Fluid restriction: 1200 mL Fluid; Room service appropriate? Yes; Fluid consistency: Thin  Diet effective now                   EDUCATION NEEDS:   Not appropriate for education at this time  Skin:  Skin Assessment: Reviewed RN Assessment  Last BM:  9/5  Height:   Ht Readings from Last 1 Encounters:  06/15/24 6' (1.829 m)    Weight:   Wt Readings from Last 1 Encounters:  06/15/24 122.5 kg    BMI:  Body mass index is 36.62 kg/m.  Estimated Nutritional Needs:   Kcal:  2100-2300  Protein:  100-115 grams  Fluid:  1L + UOP  Latrish Mogel P., RD, LDN, CNSC See AMiON for contact information

## 2024-06-18 NOTE — Progress Notes (Signed)
 Inpatient Rehab Admissions:  Inpatient Rehab Consult received.  I met with patient and mother Diane at the bedside for rehabilitation assessment and to discuss goals and expectations of an inpatient rehab admission.  Discussed average length of stay, insurance authorization requirement and discharge home after completion of CIR. Both acknowledged understanding. Pt interested in pursuing CIR and Diane supportive. Diane confirmed that she will be able to provide 24/7 support for pt after discharge. Will continue to follow.  Signed: Tinnie Yvone Cohens, MS, CCC-SLP Admissions Coordinator [phone]

## 2024-06-18 NOTE — Progress Notes (Signed)
 Daily Progress Note Intern Pager: [phone]  Patient name: Rainey Kahrs Medical record number: [id number] Date of birth: 1970/12/25 Age: 53 y.o. Gender: male  Primary Care Provider: Donzetta Rollene BRAVO, MD Consultants: Nephrology Code Status: Full code  Pt Overview and Major Events to Date:  9/4: Admitted to FMTS  Assessment and Plan: 53 year old male with PMHx ankylosing spondylitis, CKDV, CVA, HTN, T2DM and, HLD, bipolar 1 and migraines presenting with back pain in the setting of AK flare. Assessment & Plan Ankylosing spondylitis flare Symptomatically improved, feels stable on PO pain meds and agreeable to d/c IV pain meds.  Continue to mobilize and plan for acute rehab. - Continue prednisone  taper - Pain regimen: - Acetaminophen  650 mg every 8 hours scheduled - Oxycodone  5-10 mg every 4 hours as needed for moderate/severe pain - D/c Dilaudid  0.5 mg IV every 6 hours - Lidocaine  patches - Tizanidine  4 mg every 8 hours as needed - Need outpatient f/u with Rheumatology  - PT/OT : recommending CRI  CKD (chronic kidney disease), stage V (HCC) Close to oliguric UOP, will continue to monitor but appears to be advancing p.o. intake.  Now planning for renal biopsy inpatient, likely next week after ASA washout. - Nephrology consultation, appreciate recommendations - Renal diet with 1200 ml fluid restriction  - Strict I's and O's - Daily RFP  Hyperkalemia Pending AM RFP, will follow up for resolution Constipation Stooled 9/5, continue on current regimen. -Bowel regimen: MiraLax  17 g BID, senna daily Chronic health problem HTN : Continue home amLODipine  10 mg daily, Carrvedilol 3.125 mg BID Hx of Stroke : Continue ASA 81 mg daily  DMII : SSI HLD : Continue Ezetimibe  10 mg daily Bipolar 1 disorder : Continue QUEtiapine  25 mg QHS Migraines OCD   FEN/GI: Renal PPx: Heparin  Dispo: Home pending pain management and kidney bx  Subjective:  Assessed at beside, reports his pain  4/10 and overall significantly improved. Able to move his R leg and has been trying to exercise in the bed and ambulate with walker around the room.   Drinking fluids and did eat some grapes yesterday. Soft BM yesterday with bowel regimen. Normal UOP.  Objective: Temp:  [97.6 F (36.4 C)-97.9 F (36.6 C)] 97.6 F (36.4 C) (09/06 0451) Pulse Rate:  [63-81] 63 (09/06 0451) Resp:  [17] 17 (09/05 1957) BP: (119-131)/(71-76) 119/76 (09/06 0451) SpO2:  [96 %-99 %] 96 % (09/06 0451) Physical Exam: General: Sitting up in bed, NAD Cardiovascular: RRR without murmur Respiratory: CTAB. Normal WOB Abdomen: Soft, nontender, nondistended. Extremities: Able to lift bilateral lower extremities off the bed without difficulty.  No peripheral edema noted.  Laboratory: Most recent CBC Lab Results  Component Value Date   WBC 15.4 (H) 06/17/2024   HGB 10.5 (L) 06/17/2024   HCT 32.9 (L) 06/17/2024   MCV 88.0 06/17/2024   PLT 440 (H) 06/17/2024   Most recent BMP    Latest Ref Rng & Units 06/17/2024    6:42 AM  BMP  Glucose 70 - 99 mg/dL 817   BUN 6 - 20 mg/dL 898   Creatinine 9.38 - 1.24 mg/dL 4.30   Sodium 864 - 854 mmol/L 139   Potassium 3.5 - 5.1 mmol/L 5.3   Chloride 98 - 111 mmol/L 107   CO2 22 - 32 mmol/L 16   Calcium  8.9 - 10.3 mg/dL 8.8     Other pertinent labs: Glucose: 302 > 176  Imaging/Diagnostic Tests: None in the past 24 hours  Theophilus Pagan,  MD 06/18/2024, 5:48 AM  PGY-3, [facility] FPTS Intern pager: [phone], text pages welcome Secure chat group [facility] Teaching Service

## 2024-06-18 NOTE — Progress Notes (Signed)
 [name] Progress Note    Assessment/ Plan:   CKD5 -followed by Dr. Melia outpatient. Rapidly progressive CKD, open for biopsy at the end of the month. Given more prolonged hospital course, planning for renal biopsy inpatient rather than outpatient, plan for biopsy 9/10 after asa washout -biopsy will help determine any reversibility and degree of chronicity -renal replacement therapy: not indicated-not exhibiting any overt uremic signs/symptoms nor have issues with volume overload currently. Hoping to avoid starting him on dialysis as an inpatient and just proceed directly to home therapies down the road which I believe he is an excellent candidate for. If needing to start HD while he's here, then would advocate for a TDC only -will continue to follow -Avoid nephrotoxic medications including NSAIDs and iodinated intravenous contrast exposure unless the latter is absolutely indicated.  Preferred narcotic agents for pain control are hydromorphone , fentanyl , and methadone. Morphine  should not be used. Avoid Baclofen and avoid oral sodium phosphate  and magnesium citrate based laxatives / bowel preps. Continue strict Input and Output monitoring. Will monitor the patient closely with you and intervene or adjust therapy as indicated by changes in clinical status/labs    Ankylosing spondylitis - With flare - Per primary, on prednisone    Hyperkalemia - Renal diet - lokelma  10g x 1 dose today   Acidosis - sodium bicarb 650 twice daily, bicarb stable, will titrate accordingly   History of ischemic stroke - Recent admission, was discharged on 8/13.  Status post DAPT, plan per discharge summary was to keep aspirin  on until renal biopsy.  Discussed with primary service, held aspirin  (last dose 06/17/24)   Hypertension - BP currently acceptable on amlodipine  and carvedilol    Anemia of CKD - Transfuse for hemoglobin less than 7 - Checking iron studies   Diabetes Mellitus Type 2 with  Hyperglycemia -per primary  Edward Stank, MD [name]  Subjective:   Patient seen and examined bedside. No complaints/acute issues. Denies any worsening brain fog, N/V, dysgeusia, loss of appetite, diminished urinary frequency. Mom at bedside. Her and I had a lengthy conversation outside of the room, she is actually concerned about his brain fog, has had ongoing issues with short term memory, got a lot of insight from mother.   Objective:   BP 124/76 (BP Location: Right Arm)   Pulse 66   Temp 97.6 F (36.4 C) (Oral)   Resp 18   Ht 6' (1.829 m)   Wt 122.5 kg   SpO2 99%   BMI 36.62 kg/m   Intake/Output Summary (Last 24 hours) at 06/18/2024 1130 Last data filed at 06/18/2024 0816 Gross per 24 hour  Intake 600 ml  Output 1050 ml  Net -450 ml   Weight change:   Physical Exam: Gen: NAD CVS: RRR Resp: normal wob, unlabored Abd: soft Ext: trace ankle edema b/l Neuro: awake, alert, no myoclonic jerks/tremors  Imaging: No results found.  Labs: BMET Recent Labs  Lab 06/15/24 1059 06/16/24 0323 06/17/24 0642 06/18/24 0632  NA 135 136 139 134*  K 5.0 5.0 5.3* 5.2*  CL 106 109 107 104  CO2 16* 14* 16* 16*  GLUCOSE 141* 140* 182* 203*  BUN 84* 81* 101* 113*  CREATININE 5.38* 5.38* 5.69* 5.59*  CALCIUM  9.0 8.8* 8.8* 8.4*  PHOS  --   --   --  6.1*   CBC Recent Labs  Lab 06/15/24 1100 06/16/24 0323 06/17/24 0642 06/18/24 0632  WBC 14.7* 12.0* 15.4* 15.0*  NEUTROABS 11.7*  --   --   --  HGB 11.2* 10.4* 10.5* 10.2*  HCT 36.2* 32.9* 32.9* 32.5*  MCV 91.4 89.9 88.0 89.3  PLT 469* 421* 440* 477*    Medications:     acetaminophen   650 mg Oral Q8H   amLODipine   10 mg Oral Daily   atorvastatin   40 mg Oral Daily   carvedilol   3.125 mg Oral BID   ezetimibe   10 mg Oral Daily   heparin   5,000 Units Subcutaneous Q8H   insulin  aspart  0-6 Units Subcutaneous TID WC   lidocaine   2 patch Transdermal Q24H   pantoprazole   40 mg Oral Daily   polyethylene  glycol  17 g Oral BID   [START ON 06/19/2024] predniSONE   50 mg Oral Q breakfast   Followed by   NOREEN ON 06/21/2024] predniSONE   40 mg Oral Q breakfast   Followed by   NOREEN ON 06/23/2024] predniSONE   30 mg Oral Q breakfast   Followed by   NOREEN ON 06/25/2024] predniSONE   20 mg Oral Q breakfast   Followed by   NOREEN ON 06/27/2024] predniSONE   10 mg Oral Q breakfast   QUEtiapine   25 mg Oral QHS   senna  2 tablet Oral Daily   sodium bicarbonate   650 mg Oral BID   sodium zirconium cyclosilicate   10 g Oral Once      Edward Stank, MD [name] 06/18/2024, 11:30 AM

## 2024-06-18 NOTE — Assessment & Plan Note (Addendum)
 Stooled 9/5, continue on current regimen. -Bowel regimen: MiraLax  17 g BID, senna daily

## 2024-06-18 NOTE — Assessment & Plan Note (Addendum)
 Close to oliguric UOP, will continue to monitor but appears to be advancing p.o. intake.  Now planning for renal biopsy inpatient, likely next week after ASA washout. - Nephrology consultation, appreciate recommendations - Renal diet with 1200 ml fluid restriction  - Strict I's and O's - Daily RFP

## 2024-06-19 DIAGNOSIS — M450 Ankylosing spondylitis of multiple sites in spine: Secondary | ICD-10-CM | POA: Diagnosis not present

## 2024-06-19 LAB — RENAL FUNCTION PANEL
Albumin: 1.9 g/dL — ABNORMAL LOW (ref 3.5–5.0)
Anion gap: 12 (ref 5–15)
BUN: 120 mg/dL — ABNORMAL HIGH (ref 6–20)
CO2: 15 mmol/L — ABNORMAL LOW (ref 22–32)
Calcium: 8 mg/dL — ABNORMAL LOW (ref 8.9–10.3)
Chloride: 105 mmol/L (ref 98–111)
Creatinine, Ser: 5.37 mg/dL — ABNORMAL HIGH (ref 0.61–1.24)
GFR, Estimated: 12 mL/min — ABNORMAL LOW (ref >60)
Glucose, Bld: 223 mg/dL — ABNORMAL HIGH (ref 70–99)
Phosphorus: 6.2 mg/dL — ABNORMAL HIGH (ref 2.5–4.6)
Potassium: 5.3 mmol/L — ABNORMAL HIGH (ref 3.5–5.1)
Sodium: 132 mmol/L — ABNORMAL LOW (ref 135–145)

## 2024-06-19 LAB — GLUCOSE, CAPILLARY
Glucose-Capillary: 195 mg/dL — ABNORMAL HIGH (ref 70–99)
Glucose-Capillary: 199 mg/dL — ABNORMAL HIGH (ref 70–99)
Glucose-Capillary: 210 mg/dL — ABNORMAL HIGH (ref 70–99)
Glucose-Capillary: 348 mg/dL — ABNORMAL HIGH (ref 70–99)
Glucose-Capillary: 344 mg/dL — ABNORMAL HIGH (ref 70–99)

## 2024-06-19 LAB — IRON AND TIBC
Iron: 107 ug/dL (ref 45–182)
Saturation Ratios: 51 % — ABNORMAL HIGH (ref 17.9–39.5)
TIBC: 209 ug/dL — ABNORMAL LOW (ref 250–450)
UIBC: 102 ug/dL

## 2024-06-19 LAB — FERRITIN: Ferritin: 120 ng/mL (ref 24–336)

## 2024-06-19 MED ORDER — SODIUM BICARBONATE 650 MG PO TABS
1300.0000 mg | ORAL_TABLET | Freq: Two times a day (BID) | ORAL | Status: DC
  Administered 2024-06-19 – 2024-06-26 (×14): 1300 mg via ORAL
  Filled 2024-06-19 (×14): qty 2

## 2024-06-19 MED ORDER — SODIUM ZIRCONIUM CYCLOSILICATE 10 G PO PACK
10.0000 g | PACK | Freq: Three times a day (TID) | ORAL | Status: AC
  Administered 2024-06-19 (×3): 10 g via ORAL
  Filled 2024-06-19 (×3): qty 1

## 2024-06-19 MED ORDER — INSULIN ASPART 100 UNIT/ML IJ SOLN: 0.0000 [IU] | Freq: Three times a day (TID) | INTRAMUSCULAR | Status: DC

## 2024-06-19 MED ORDER — INSULIN ASPART 100 UNIT/ML IJ SOLN
0.0000 [IU] | Freq: Three times a day (TID) | INTRAMUSCULAR | Status: DC
  Administered 2024-06-19: 7 [IU] via SUBCUTANEOUS
  Administered 2024-06-20: 2 [IU] via SUBCUTANEOUS
  Administered 2024-06-20: 7 [IU] via SUBCUTANEOUS
  Administered 2024-06-20 – 2024-06-21 (×2): 3 [IU] via SUBCUTANEOUS
  Administered 2024-06-21: 2 [IU] via SUBCUTANEOUS
  Administered 2024-06-21: 9 [IU] via SUBCUTANEOUS
  Administered 2024-06-22: 1 [IU] via SUBCUTANEOUS
  Administered 2024-06-22: 5 [IU] via SUBCUTANEOUS
  Administered 2024-06-23: 3 [IU] via SUBCUTANEOUS
  Administered 2024-06-23 – 2024-06-24 (×3): 2 [IU] via SUBCUTANEOUS
  Administered 2024-06-25 (×2): 7 [IU] via SUBCUTANEOUS
  Administered 2024-06-25: 3 [IU] via SUBCUTANEOUS
  Administered 2024-06-26: 7 [IU] via SUBCUTANEOUS
  Administered 2024-06-26 – 2024-06-27 (×2): 1 [IU] via SUBCUTANEOUS

## 2024-06-19 MED ORDER — INSULIN ASPART 100 UNIT/ML IJ SOLN
3.0000 [IU] | Freq: Once | INTRAMUSCULAR | Status: AC
  Administered 2024-06-19: 3 [IU] via SUBCUTANEOUS

## 2024-06-19 NOTE — Progress Notes (Signed)
 Daily Progress Note Intern Pager: [phone]  Patient name: Edward [name] Medical record number: [id number] Date of birth: November 23, 1970 Age: 53 y.o. Gender: male  Primary Care Provider: Donzetta Rollene BRAVO, MD Consultants: Nephrology, Dietitian  Code Status: Full  Pt Overview and Major Events to Date:  9/4 : Admitted for back pain   Pertinent PMH/PSH includes Ankylosing spondylitis Chronic kidney disease, stage 3b Chronic pain Stroke Gastric Bypass (07/2015) Obesity HTN DMII HLD Bipolar 1 disorder Migraines OCD Rheumatic fever at age 38   Assessment & Plan 53 year old male with PMHx ankylosing spondylitis, CKDV, CVA, HTN, T2DM and, HLD, bipolar 1 and migraines presenting with back pain in the setting of AK flare.  Assessment & Plan Ankylosing spondylitis flare Symptomatically improved, feels stable on PO pain meds and agreeable to d/c IV pain meds.  Continue to mobilize and plan for acute rehab. - Continue prednisone  taper - Pain regimen: - Acetaminophen  650 mg every 8 hours scheduled - Oxycodone  5-10 mg every 4 hours as needed for moderate/severe pain - D/c Dilaudid  0.5 mg IV every 6 hours - Lidocaine  patches - Tizanidine  4 mg every 8 hours as needed - Need outpatient f/u with Rheumatology  - PT/OT : recommending CRI  CKD (chronic kidney disease), stage V (HCC) Close to oliguric UOP, will continue to monitor but appears to be advancing p.o. intake.  Now planning for renal biopsy inpatient, likely next week after ASA washout. - Nephrology consultation, appreciate recommendations             - Planing on Renal biopsy on Wednesday 06/22/24 ( 5 day after ASA 81 mg stopped ) - Renal diet with 1200 ml fluid restriction  - Strict I's and O's - Daily RFP  Hyperkalemia Pending AM RFP, will follow up for resolution Constipation Last BM 06/18/24, continue on current regimen. -Bowel regimen: MiraLax  17 g BID, senna daily Chronic health problem HTN : Continue home amLODipine  10  mg daily, Carrvedilol 3.125 mg BID Hx of Stroke : Hold ASA 81 mg daily  DMII : SSI HLD : Continue Ezetimibe  10 mg daily Bipolar 1 disorder : Continue QUEtiapine  25 mg QHS Migraines OCD  FEN/GI: Renal diet , Protonix  EC 40 mg daily PPx: SQH Dispo:SNF pending clinical improvement .  Subjective:  [name] is a 53 y.o. male presenting with back pain . He starts his back pain has been significantly improved. Patient is doing well. Pending CIR placement and Kidney Biopsy.   Objective: Temp:  [97.7 F (36.5 C)-97.9 F (36.6 C)] 97.9 F (36.6 C) (09/07 0858) Pulse Rate:  [61-76] 61 (09/07 1044) Resp:  [16-18] 18 (09/07 0858) BP: (117-132)/(74-77) 130/77 (09/07 1044) SpO2:  [97 %-99 %] 99 % (09/07 0858)  Physical Exam Cardiovascular:     Rate and Rhythm: Normal rate.     Pulses: Normal pulses.  Pulmonary:     Effort: Pulmonary effort is normal.     Breath sounds: Normal breath sounds.  Abdominal:     Palpations: Abdomen is soft.  Musculoskeletal:        General: Tenderness (back) present.  Skin:    General: Skin is warm and dry.     Capillary Refill: Capillary refill takes less than 2 seconds.  Neurological:     Mental Status: He is oriented to person, place, and time. Mental status is at baseline.     Sensory: Sensory deficit (residual right sensory changes on R arm and leg,) present.  Psychiatric:  Mood and Affect: Mood normal.        Behavior: Behavior normal.   Laboratory: Most recent CBC Lab Results  Component Value Date   WBC 15.0 (H) 06/18/2024   HGB 10.2 (L) 06/18/2024   HCT 32.5 (L) 06/18/2024   MCV 89.3 06/18/2024   PLT 477 (H) 06/18/2024   Most recent BMP    Latest Ref Rng & Units 06/19/2024    4:37 AM  BMP  Glucose 70 - 99 mg/dL 776   BUN 6 - 20 mg/dL 879   Creatinine 9.38 - 1.24 mg/dL 4.62   Sodium 864 - 854 mmol/L 132   Potassium 3.5 - 5.1 mmol/L 5.3   Chloride 98 - 111 mmol/L 105   CO2 22 - 32 mmol/L 15   Calcium  8.9 - 10.3 mg/dL  8.0     Imaging/Diagnostic Tests: EXAM: MRI SACRUM WITHOUT CONTRAST IMPRESSION: 1. Symmetric marrow edema along the sacroiliac joints with suspected erosions, favoring sacroiliitis related to ankylosing spondylitis. 2. Tendinopathy and potentially mild partial tearing of the right proximal hamstring tendon. Minimal tendinopathy of the left proximal hamstring tendon. 3. Trace edema in the bilateral hip adductor musculature adjacent to the pubis. 4. Subtle endplate edema at O5-4 and L5-S1.  Suzen Houston NOVAK, DO 06/19/2024, 1:08 PM  PGY-1, [facility] FPTS Intern pager: [phone], text pages welcome Secure chat group [facility] Teaching Service

## 2024-06-19 NOTE — Assessment & Plan Note (Signed)
 Symptomatically improved, feels stable on PO pain meds and agreeable to d/c IV pain meds.  Continue to mobilize and plan for acute rehab. - Continue prednisone  taper - Pain regimen: - Acetaminophen  650 mg every 8 hours scheduled - Oxycodone  5-10 mg every 4 hours as needed for moderate/severe pain - D/c Dilaudid  0.5 mg IV every 6 hours - Lidocaine  patches - Tizanidine  4 mg every 8 hours as needed - Need outpatient f/u with Rheumatology  - PT/OT : recommending CRI

## 2024-06-19 NOTE — Progress Notes (Signed)
 Physical Therapy Treatment Patient Details Name: Edward [name] MRN: [id number] DOB: [birth year] Today's Date: 06/19/2024   History of Present Illness Pt is a 53 y.o. male presenting 06/15/24 with R groin pain that radiates to lower back and causing LE weakness and inability to walk.  MRI showed increased size L4-5 disc extrusion and unchanged L5-S1 central disc protrusion.  PMH: DMII, HTN, HLD, ankylosing spondylitis in spine, bipolar 1, CKD V (pending HD), gastric bypass, L thalamic CVA 05/22/24, remote infarcts L basal ganglia and L thalamus    PT Comments  Continuing work on functional mobility and activity tolerance;  Session focused on progressive amb with special attention to self-monitor for activity tolerance, and pt is showing very nice improvements; Able to manage in the room with RW, and initiated hallway amb with RW and close monitor; needed 2 rest breaks; We discussed the need for good self-monitor for activity tolerance; Pt recognizes that while today's session was excellent, we will need to see how he feels later this evening/tomorrow to get an idea of how much his system is taxed; Worth continuing to pursue AIR for post-acute rehab to also get a pain/spasm med regimen solidified for his ankylosing spondylosis   If plan is discharge home, recommend the following: Assistance with cooking/housework;Assist for transportation;Help with stairs or ramp for entrance;Two people to help with walking and/or transfers;Two people to help with bathing/dressing/bathroom   Can travel by private vehicle     Yes  Equipment Recommendations  Rolling walker (2 wheels);[place] (will consider cane over next few sessions)    Recommendations for Other Services       Precautions / Restrictions Precautions Precautions: Fall Recall of Precautions/Restrictions: Intact Precaution/Restrictions Comments: Fall risk is minimized with use of RW Restrictions Weight Bearing Restrictions Per Provider Order: No      Mobility  Bed Mobility Overal bed mobility: Needs Assistance Bed Mobility: Supine to Sit     Supine to sit: Supervision     General bed mobility comments: Supervision fro safety    Transfers Overall transfer level: Needs assistance Equipment used: Rolling walker (2 wheels) Transfers: Sit to/from Stand Sit to Stand: Contact guard assist           General transfer comment: CGA for safety; nice, smooth rise; light hand suport on RW    Ambulation/Gait Ambulation/Gait assistance: Contact guard assist Gait Distance (Feet): 100 Feet (at least; Hallway ambulation) Assistive device: Rolling walker (2 wheels) Gait Pattern/deviations: Step-through pattern, Decreased stance time - right, Drifts right/left       General Gait Details: 2 standing rest breaks with UE support on RW   Stairs             Wheelchair Mobility     Tilt Bed    Modified Rankin (Stroke Patients Only)       Balance     Sitting balance-Leahy Scale: Good       Standing balance-Leahy Scale: Fair                              Hotel manager: No apparent difficulties  Cognition Arousal: Alert Behavior During Therapy: WFL for tasks assessed/performed   PT - Cognitive impairments: Memory, History of cognitive impairments                       PT - Cognition Comments: STM deficits since CVA as well as word finding difficulties. Pt is aware of these  deficits Following commands: Intact Following commands impaired: Follows multi-step commands with increased time    Cueing Cueing Techniques: Verbal cues  Exercises      General Comments General comments (skin integrity, edema, etc.): Pt's mother present for session      Pertinent Vitals/Pain Pain Assessment Pain Assessment: 0-10 Pain Score: 5  Pain Location: R posterior hip and flank Pain Descriptors / Indicators: Aching, Discomfort, Grimacing, Sore, Spasm Pain Intervention(s):  Monitored during session    Home Living                          Prior Function            PT Goals (current goals can now be found in the care plan section) Acute Rehab PT Goals Patient Stated Goal: Get well, return to work PT Goal Formulation: With patient Time For Goal Achievement: 06/30/24 Potential to Achieve Goals: Fair Progress towards PT goals: Progressing toward goals (Anticipate will meet goals next session)    Frequency    Min 2X/week      PT Plan      Co-evaluation              AM-PAC PT 6 Clicks Mobility   Outcome Measure  Help needed turning from your back to your side while in a flat bed without using bedrails?: None Help needed moving from lying on your back to sitting on the side of a flat bed without using bedrails?: None Help needed moving to and from a bed to a chair (including a wheelchair)?: A Little Help needed standing up from a chair using your arms (e.g., wheelchair or bedside chair)?: A Little Help needed to walk in hospital room?: A Little Help needed climbing 3-5 steps with a railing? : A Lot 6 Click Score: 19    End of Session   Activity Tolerance: Patient tolerated treatment well Patient left: with call bell/phone within reach;in chair;with family/visitor present Nurse Communication: Mobility status PT Visit Diagnosis: Other symptoms and signs involving the nervous system (R29.898);Pain;Muscle weakness (generalized) (M62.81) Pain - Right/Left: Right Pain - part of body:  (groin)     Time: 8646-8581 PT Time Calculation (min) (ACUTE ONLY): 25 min  Charges:    $Gait Training: 23-37 mins PT General Charges $$ ACUTE PT VISIT: 1 Visit                     Silvano Currier, PT  Acute Rehabilitation Services Office [phone] Secure Chat welcomed    Silvano VEAR Currier 06/19/2024, 4:01 PM

## 2024-06-19 NOTE — Assessment & Plan Note (Signed)
 Pending AM RFP, will follow up for resolution

## 2024-06-19 NOTE — Assessment & Plan Note (Signed)
 Close to oliguric UOP, will continue to monitor but appears to be advancing p.o. intake.  Now planning for renal biopsy inpatient, likely next week after ASA washout. - Nephrology consultation, appreciate recommendations             - Planing on Renal biopsy on Wednesday 06/22/24 ( 5 day after ASA 81 mg stopped ) - Renal diet with 1200 ml fluid restriction  - Strict I's and O's - Daily RFP

## 2024-06-19 NOTE — Progress Notes (Signed)
 [facility] Progress Note    Assessment/ Plan:   CKD5 -followed by Dr. Melia outpatient. Rapidly progressive CKD, open for biopsy at the end of the month. Given more prolonged hospital course, planning for renal biopsy inpatient rather than outpatient, plan for biopsy 9/10 after asa washout. Req form in front of chart -biopsy will help determine any reversibility and degree of chronicity -renal replacement therapy: not indicated-not exhibiting any overt uremic signs/symptoms nor have issues with volume overload currently. Hoping to avoid starting him on dialysis as an inpatient and just proceed directly to home therapies down the road which I believe he is an excellent candidate for. If needing to start HD while he's here, then would advocate for a TDC only. Understandably, BUN is high but prednisone  can also be a factor for this. Will medically manage his K -will continue to follow -Avoid nephrotoxic medications including NSAIDs and iodinated intravenous contrast exposure unless the latter is absolutely indicated.  Preferred narcotic agents for pain control are hydromorphone , fentanyl , and methadone. Morphine  should not be used. Avoid Baclofen and avoid oral sodium phosphate  and magnesium citrate based laxatives / bowel preps. Continue strict Input and Output monitoring. Will monitor the patient closely with you and intervene or adjust therapy as indicated by changes in clinical status/labs    Ankylosing spondylitis - With flare - Per primary, on prednisone    Hyperkalemia - Renal diet - lokelma  10g TID today - nahco3 plan as below   Acidosis - sodium bicarb 650 twice daily, increasing to 1300mg  BID   History of ischemic stroke - Recent admission, was discharged on 8/13.  Status post DAPT, plan per discharge summary was to keep aspirin  on until renal biopsy.  Discussed with primary service, held aspirin  (last dose 06/17/24)   Hypertension - BP currently acceptable on  amlodipine  and carvedilol    Anemia of CKD - Transfuse for hemoglobin less than 7   Diabetes Mellitus Type 2 with Hyperglycemia -per primary  Dispo: CIR?  Ephriam Stank, MD [facility]  Subjective:   Patient seen and examined bedside. No complaints/acute issues. Actually feels much better today in regards to his pain. Denies any chest pain, SOB, swelling, worsening brain fog (has been present since his stroke and actually says its a bit better today), N/V/dysgeusia, loss of appetite.   Objective:   BP 130/77   Pulse 61   Temp 97.9 F (36.6 C)   Resp 18   Ht 6' (1.829 m)   Wt 122.5 kg   SpO2 99%   BMI 36.62 kg/m   Intake/Output Summary (Last 24 hours) at 06/19/2024 1241 Last data filed at 06/19/2024 0600 Gross per 24 hour  Intake 360 ml  Output 480 ml  Net -120 ml   Weight change:   Physical Exam: Gen: NAD CVS: RRR Resp: normal wob, unlabored Abd: soft Ext: trace ankle edema b/l Neuro: awake, alert, no myoclonic jerks/tremors  Imaging: No results found.  Labs: BMET Recent Labs  Lab 06/15/24 1059 06/16/24 0323 06/17/24 0642 06/18/24 0632 06/19/24 0437  NA 135 136 139 134* 132*  K 5.0 5.0 5.3* 5.2* 5.3*  CL 106 109 107 104 105  CO2 16* 14* 16* 16* 15*  GLUCOSE 141* 140* 182* 203* 223*  BUN 84* 81* 101* 113* 120*  CREATININE 5.38* 5.38* 5.69* 5.59* 5.37*  CALCIUM  9.0 8.8* 8.8* 8.4* 8.0*  PHOS  --   --   --  6.1* 6.2*   CBC Recent Labs  Lab 06/15/24 1100 06/16/24  9676 06/17/24 0642 06/18/24 0632  WBC 14.7* 12.0* 15.4* 15.0*  NEUTROABS 11.7*  --   --   --   HGB 11.2* 10.4* 10.5* 10.2*  HCT 36.2* 32.9* 32.9* 32.5*  MCV 91.4 89.9 88.0 89.3  PLT 469* 421* 440* 477*    Medications:     acetaminophen   650 mg Oral Q8H   amLODipine   10 mg Oral Daily   atorvastatin   40 mg Oral Daily   carvedilol   3.125 mg Oral BID   ezetimibe   10 mg Oral Daily   feeding supplement  237 mL Oral BID AC   heparin   5,000 Units Subcutaneous Q8H   insulin   aspart  0-6 Units Subcutaneous TID WC   lidocaine   2 patch Transdermal Q24H   pantoprazole   40 mg Oral Daily   polyethylene glycol  17 g Oral BID   predniSONE   50 mg Oral Q breakfast   Followed by   NOREEN ON 06/21/2024] predniSONE   40 mg Oral Q breakfast   Followed by   NOREEN ON 06/23/2024] predniSONE   30 mg Oral Q breakfast   Followed by   NOREEN ON 06/25/2024] predniSONE   20 mg Oral Q breakfast   Followed by   NOREEN ON 06/27/2024] predniSONE   10 mg Oral Q breakfast   QUEtiapine   25 mg Oral QHS   senna  2 tablet Oral Daily   sodium bicarbonate   650 mg Oral BID   sodium zirconium cyclosilicate   10 g Oral TID      Ephriam Stank, MD [facility] 06/19/2024, 12:41 PM

## 2024-06-19 NOTE — Assessment & Plan Note (Signed)
 Last BM 06/18/24, continue on current regimen. -Bowel regimen: MiraLax  17 g BID, senna daily

## 2024-06-19 NOTE — Assessment & Plan Note (Signed)
 HTN : Continue home amLODipine  10 mg daily, Carrvedilol 3.125 mg BID Hx of Stroke : Hold ASA 81 mg daily  DMII : SSI HLD : Continue Ezetimibe  10 mg daily Bipolar 1 disorder : Continue QUEtiapine  25 mg QHS Migraines OCD

## 2024-06-19 NOTE — PMR Pre-admission (Shared)
 PMR Admission Coordinator Pre-Admission Assessment  Patient: Edward [name] is an 53 y.o., male MRN: [id number] DOB: 06-Jan-1971 Height: 6' (182.9 cm) Weight: 122.5 kg  Insurance Information HMO: ***    PPO: ***     PCP:      IPA:      80/20:      OTHER:  PRIMARY: Generic [facility] [employer]      Policy#: [id number]      Subscriber: patient CM Name: ***      Phone#: ***     Fax#: *** Pre-Cert#: ***      Employer: *** Benefits:  Phone #: ***     Name: *** Eustacio. Date: ***     Deduct: ***      Out of Pocket Max: ***      Life Max: *** CIR: ***      SNF: *** Outpatient: ***     Co-Pay: *** Home Health: ***      Co-Pay: *** DME: ***     Co-Pay: *** Providers: in-network SECONDARY:       Policy#:      Phone#:   Financial Counselor:       Phone#:   The Data processing manager" for patients in Inpatient Rehabilitation Facilities with attached "Privacy Act Statement-Health Care Records" was provided and verbally reviewed with: Patient  Emergency Contact Information Contact Information     Name Relation Home Work Mobile   Clapp,Diane Mother [phone]        Other Contacts   None on File     Current Medical History  Patient Admitting Diagnosis: *** History of Present Illness: Pt is a 53 year old male with medical hx significant for: stage 5 CKD (pending dialysis, on kidney transplant lis), ankylosing spondylitis x40 years, recent ischemic stroke (05/22/24), gastric ulcer. Pt presented to [facility] on 06/15/24 d/t severe, worsening musculoskeletal pain (especially in neck and back but also in joints). Pain became acutely debilitating overnight resulting in inability to stand or walk. CT head negative for acute abnormalities. Neurology consulted. Chest and pelvis x-rays negative. MRI lumbar spine showed increased size of right subarticular disc extrusion, unchanged small central disk protrusion without spinal stenosis and unchanged moderate bilateral neuroforaminal stenosis. MRI  sacrum showed increased size L4-5 right subarticular disc extrusion and unchanged L5-S1 cental disc protrusion. Nephrology consulted. Recommended inpatient renal biopsy. *** Therapy evaluations completed and CIR recommended d/t pt's deficits in functional mobility.  Complete NIHSS TOTAL: 1  Patient's medical record from [facility] has been reviewed by the rehabilitation admission coordinator and physician.  Past Medical History  Past Medical History:  Diagnosis Date   Ankylosing spondylitis (HCC) 1993   Bipolar depression (HCC) 2005   CVA (cerebral vascular accident) (HCC) 02/2024   DM (diabetes mellitus), type 2 (HCC) 2007   Generalized anxiety disorder 1997   High-functioning autism spectrum disorder 2005   Renal disorder    CKD stage 4   Rheumatic fever age 65    Has the patient had major surgery during 100 days prior to admission? No  Family History   family history includes Alcohol abuse in his brother; Autoimmune disease in his father and paternal grandfather; Breast cancer in his mother; Diabetes in his brother and father; Early death (age of onset: 37) in his father; Heart disease in his maternal grandfather and maternal grandmother; Hyperlipidemia in his brother and mother; Hypertension in his brother and mother; Squamous cell carcinoma in his father; Stroke in his maternal grandmother; Thyroid   disease in his brother and father.  Current Medications  Current Facility-Administered Medications:    acetaminophen  (TYLENOL ) tablet 650 mg, 650 mg, Oral, Q8H, Theophilus Pagan, MD, 650 mg at 06/19/24 0522   amLODipine  (NORVASC ) tablet 10 mg, 10 mg, Oral, Daily, Theophilus Pagan, MD, 10 mg at 06/18/24 0920   atorvastatin  (LIPITOR) tablet 40 mg, 40 mg, Oral, Daily, Theophilus Pagan, MD, 40 mg at 06/18/24 9081   carvedilol  (COREG ) tablet 3.125 mg, 3.125 mg, Oral, BID, Theophilus Pagan, MD, 3.125 mg at 06/18/24 2236   ezetimibe  (ZETIA ) tablet 10 mg, 10 mg, Oral, Daily, Theophilus Pagan, MD, 10 mg at 06/18/24 0919   feeding supplement (ENSURE PLUS HIGH PROTEIN) liquid 237 mL, 237 mL, Oral, BID AC, Donah Laymon PARAS, MD, 237 mL at 06/18/24 1756   heparin  injection 5,000 Units, 5,000 Units, Subcutaneous, Q8H, Theophilus Pagan, MD, 5,000 Units at 06/19/24 9476   insulin  aspart (novoLOG ) injection 0-6 Units, 0-6 Units, Subcutaneous, TID WC, Theophilus Pagan, MD, 2 Units at 06/18/24 1800   lidocaine  (LIDODERM ) 5 % 2 patch, 2 patch, Transdermal, Q24H, Theophilus Pagan, MD, 2 patch at 06/19/24 0236   oxyCODONE  (Oxy IR/ROXICODONE ) immediate release tablet 5 mg, 5 mg, Oral, Q4H PRN, 5 mg at 06/19/24 9362 **OR** oxyCODONE  (Oxy IR/ROXICODONE ) immediate release tablet 10 mg, 10 mg, Oral, Q4H PRN, Theophilus Pagan, MD, 10 mg at 06/18/24 2235   pantoprazole  (PROTONIX ) EC tablet 40 mg, 40 mg, Oral, Daily, Theophilus Pagan, MD, 40 mg at 06/18/24 0919   polyethylene glycol (MIRALAX  / GLYCOLAX ) packet 17 g, 17 g, Oral, BID, Suknaim, Kulkaew B, DO, 17 g at 06/18/24 2236   [COMPLETED] predniSONE  (DELTASONE ) tablet 60 mg, 60 mg, Oral, Q breakfast, 60 mg at 06/18/24 0916 **FOLLOWED BY** predniSONE  (DELTASONE ) tablet 50 mg, 50 mg, Oral, Q breakfast **FOLLOWED BY** [START ON 06/21/2024] predniSONE  (DELTASONE ) tablet 40 mg, 40 mg, Oral, Q breakfast **FOLLOWED BY** [START ON 06/23/2024] predniSONE  (DELTASONE ) tablet 30 mg, 30 mg, Oral, Q breakfast **FOLLOWED BY** [START ON 06/25/2024] predniSONE  (DELTASONE ) tablet 20 mg, 20 mg, Oral, Q breakfast **FOLLOWED BY** [START ON 06/27/2024] predniSONE  (DELTASONE ) tablet 10 mg, 10 mg, Oral, Q breakfast, Mabe, Gerald, MD   QUEtiapine  (SEROQUEL ) tablet 25 mg, 25 mg, Oral, QHS, Theophilus Pagan, MD, 25 mg at 06/18/24 2236   senna (SENOKOT) tablet 17.2 mg, 2 tablet, Oral, Daily, Suknaim, Kulkaew B, DO, 17.2 mg at 06/18/24 0920   sodium bicarbonate  tablet 650 mg, 650 mg, Oral, BID, Singh, Vikas, MD, 650 mg at 06/18/24 2235   sodium zirconium cyclosilicate  (LOKELMA ) packet  10 g, 10 g, Oral, TID, Dennise Hoes, MD   tiZANidine  (ZANAFLEX ) tablet 4 mg, 4 mg, Oral, Q8H PRN, Theophilus Pagan, MD, 4 mg at 06/19/24 9362  Patients Current Diet:  Diet Order             Diet NPO time specified Except for: Sips with Meds  Diet effective midnight           Diet renal with fluid restriction Fluid restriction: 1200 mL Fluid; Room service appropriate? Yes; Fluid consistency: Thin  Diet effective now                   Precautions / Restrictions Precautions Precautions: Fall Restrictions Weight Bearing Restrictions Per Provider Order: No   Has the patient had 2 or more falls or a fall with injury in the past year? No  Prior Activity Level Community (5-7x/wk): gets out of the house daily, drives  Prior Functional Level Self  Care: Did the patient need help bathing, dressing, using the toilet or eating? Independent  Indoor Mobility: Did the patient need assistance with walking from room to room (with or without device)? Independent  Stairs: Did the patient need assistance with internal or external stairs (with or without device)? Independent  Functional Cognition: Did the patient need help planning regular tasks such as shopping or remembering to take medications? Independent  Patient Information Are you of Hispanic, Latino/a,or Spanish origin?: A. No, not of Hispanic, Latino/a, or Spanish origin What is your race?: A. White Do you need or want an interpreter to communicate with a doctor or health care staff?: 0. No  Patient's Response To:  Health Literacy and Transportation Is the patient able to respond to health literacy and transportation needs?: Yes Health Literacy - How often do you need to have someone help you when you read instructions, pamphlets, or other written material from your doctor or pharmacy?: Never In the past 12 months, has lack of transportation kept you from medical appointments or from getting medications?: No In the past 12 months,  has lack of transportation kept you from meetings, work, or from getting things needed for daily living?: No  Home Assistive Devices / Equipment Home Equipment: Cane - quad  Prior Device Use: Indicate devices/aids used by the patient prior to current illness, exacerbation or injury? None of the above  Current Functional Level Cognition  Orientation Level: Oriented X4    Extremity Assessment (includes Sensation/Coordination)  Upper Extremity Assessment: Right hand dominant, Generalized weakness, RUE deficits/detail, LUE deficits/detail RUE Deficits / Details: generalized weakness; mildly decreased coordination LUE Deficits / Details: generalized weakness; mildly decreased coordination  Lower Extremity Assessment: Defer to PT evaluation RLE Deficits / Details: hip flex 1/5 and pt cannot barely tolerate due to groin pain, knee ext 3/5, limited testing due to pain RLE: Unable to fully assess due to pain RLE Sensation:  (hypersensitivity) RLE Coordination: decreased gross motor    ADLs  Overall ADL's : Needs assistance/impaired Eating/Feeding: Set up, Sitting Grooming: Set up, Sitting Upper Body Bathing: Minimal assistance, Sitting Lower Body Bathing: Maximal assistance, Sit to/from stand, Cueing for compensatory techniques Upper Body Dressing : Minimal assistance, Sitting Lower Body Dressing: Maximal assistance, Sit to/from stand Toilet Transfer: Minimal assistance, BSC/3in1, Rolling walker (2 wheels) (step-pivot; from elevated surface) Toilet Transfer Details (indicate cue type and reason): simulated at EOB Toileting- Clothing Manipulation and Hygiene: Maximal assistance, Sit to/from stand Functional mobility during ADLs: Minimal assistance, Rolling walker (2 wheels), +2 for safety/equipment (pt ambulated approximately 10' in room with RW with increased time and Min assist for balance throughout. Recommend +2 for further distances for safety.) General ADL Comments: Pt with decreased  activity tolerance and requiring increased time and effort for all tasks    Mobility  Overal bed mobility: Needs Assistance Bed Mobility: Rolling, Supine to Sit, Sit to Supine Rolling: Supervision Supine to sit: Contact guard Sit to supine: Supervision General bed mobility comments: CGA to manage trunk and R LE into upright sitting; otherwise Supervision with increased time and effort    Transfers  Overall transfer level: Needs assistance Equipment used: Rolling walker (2 wheels) Transfers: Sit to/from Stand, Bed to chair/wheelchair/BSC Sit to Stand: Min assist, From elevated surface Bed to/from chair/wheelchair/BSC transfer type:: Step pivot Step pivot transfers: Min assist, From elevated surface General transfer comment: Min assist to power up and to maintain balance in standing/stepping; pt requires increased time    Ambulation / Gait / Stairs / Wheelchair  Mobility  Ambulation/Gait General Gait Details: currently unable to come to EOB to stand    Posture / Balance Dynamic Sitting Balance Sitting balance - Comments: able to sit at EOB with Supervision Balance Overall balance assessment: Needs assistance Sitting-balance support: Single extremity supported, No upper extremity supported, Feet supported Sitting balance-Leahy Scale: Fair Sitting balance - Comments: able to sit at EOB with Supervision Standing balance support: Bilateral upper extremity supported, During functional activity, Reliant on assistive device for balance Standing balance-Leahy Scale: Poor Standing balance comment: Reliant on B UE support and assist of therapist for baalnce Standardized Balance Assessment Standardized Balance Assessment : Berg Balance Test, Dynamic Gait Index    Special considerations/life events  Skin Ecchymosis: abdomen/right, left and Diabetic management Novolog  0-6 units 3x daily with meals   Previous Home Environment (from acute therapy documentation) Living Arrangements: Parent, Other  relatives (mother and step-father)  Lives With: Family Available Help at Discharge: Family, Available 24 hours/day Type of Home: House Home Layout: One level Home Access: Stairs to enter Entrance Stairs-Rails: Can reach both Entrance Stairs-Number of Steps: 3 Bathroom Shower/Tub: Engineer, manufacturing systems: Handicapped height Bathroom Accessibility: Yes How Accessible: Accessible via walker Home Care Services: No Additional Comments: Pt currently staying with mother and step-father who can provide 24/7 assist with meals, transportatoon, medication assistances, etc, but are unable to provide much physical assistance. Pt's fiance is also able to provide PRN assist.  Discharge Living Setting Plans for Discharge Living Setting: Patient's home Type of Home at Discharge: House Discharge Home Layout: One level Discharge Home Access: Stairs to enter Entrance Stairs-Rails: Can reach both Entrance Stairs-Number of Steps: 3 Discharge Bathroom Shower/Tub: Tub/shower unit Discharge Bathroom Toilet: Handicapped height Discharge Bathroom Accessibility: Yes How Accessible: Accessible via walker Does the patient have any problems obtaining your medications?: No  Social/Family/Support Systems Anticipated Caregiver: Loa Sacks, mother Anticipated Caregiver's Contact Information: [phone] Caregiver Availability: 24/7 Discharge Plan Discussed with Primary Caregiver: Yes Is Caregiver In Agreement with Plan?: Yes Does Caregiver/Family have Issues with Lodging/Transportation while Pt is in Rehab?: No  Goals Patient/Family Goal for Rehab: *** Expected length of stay: *** Pt/Family Agrees to Admission and willing to participate: Yes Program Orientation Provided & Reviewed with Pt/Caregiver Including Roles  & Responsibilities: Yes  Decrease burden of Care through IP rehab admission: NA  Possible need for SNF placement upon discharge: Not anticipated  Patient Condition: I have reviewed  medical records from [facility], spoken with CM, and patient and family member. I met with patient at the bedside for inpatient rehabilitation assessment.  Patient will benefit from ongoing {CHL IP PT OT DOE:[id number]}, can actively participate in 3 hours of therapy a day 5 days of the week, and can make measurable gains during the admission.  Patient will also benefit from the coordinated team approach during an Inpatient Acute Rehabilitation admission.  The patient will receive intensive therapy as well as Rehabilitation physician, nursing, social worker, and care management interventions.  Due to safety, skin/wound care, disease management, medication administration, pain management, and patient education the patient requires 24 hour a day rehabilitation nursing.  The patient is currently *** with mobility and basic ADLs.  Discharge setting and therapy post discharge at home with home health is anticipated.  Patient has agreed to participate in the Acute Inpatient Rehabilitation Program and will admit {Time; today/tomorrow:10263}.  Preadmission Screen Completed By:  Tinnie SHAUNNA Yvone Delayne, 06/19/2024 8:12 AM ______________________________________________________________________   Discussed status with Dr. PIERRETTE on *** at *** and  received approval for admission today.  Admission Coordinator:  Tinnie SHAUNNA Yvone Delayne, CCC-SLP, time ***/Date ***   Assessment/Plan: Diagnosis: *** Does the need for close, 24 hr/day Medical supervision in concert with the patient's rehab needs make it unreasonable for this patient to be served in a less intensive setting? {yes_no_potentially:3041433} Co-Morbidities requiring supervision/potential complications: *** Due to {due un:6958565}, does the patient require 24 hr/day rehab nursing? {yes_no_potentially:3041433} Does the patient require coordinated care of a physician, rehab nurse, PT, OT, and SLP to address physical and functional deficits in the context of the  above medical diagnosis(es)? {yes_no_potentially:3041433} Addressing deficits in the following areas: {deficits:3041436} Can the patient actively participate in an intensive therapy program of at least 3 hrs of therapy 5 days a week? {yes_no_potentially:3041433} The potential for patient to make measurable gains while on inpatient rehab is {potential:3041437} Anticipated functional outcomes upon discharge from inpatient rehab: {functional outcomes:304600100} PT, {functional outcomes:304600100} OT, {functional outcomes:304600100} SLP Estimated rehab length of stay to reach the above functional goals is: *** Anticipated discharge destination: {anticipated dc setting:21604} 10. Overall Rehab/Functional Prognosis: {potential:3041437}   MD Signature: ***

## 2024-06-20 DIAGNOSIS — M450 Ankylosing spondylitis of multiple sites in spine: Secondary | ICD-10-CM | POA: Diagnosis not present

## 2024-06-20 LAB — RENAL FUNCTION PANEL
Albumin: 2 g/dL — ABNORMAL LOW (ref 3.5–5.0)
Anion gap: 12 (ref 5–15)
BUN: 118 mg/dL — ABNORMAL HIGH (ref 6–20)
CO2: 18 mmol/L — ABNORMAL LOW (ref 22–32)
Calcium: 8 mg/dL — ABNORMAL LOW (ref 8.9–10.3)
Chloride: 104 mmol/L (ref 98–111)
Creatinine, Ser: 5.12 mg/dL — ABNORMAL HIGH (ref 0.61–1.24)
GFR, Estimated: 13 mL/min — ABNORMAL LOW (ref >60)
Glucose, Bld: 192 mg/dL — ABNORMAL HIGH (ref 70–99)
Phosphorus: 5.6 mg/dL — ABNORMAL HIGH (ref 2.5–4.6)
Potassium: 5.1 mmol/L (ref 3.5–5.1)
Sodium: 134 mmol/L — ABNORMAL LOW (ref 135–145)

## 2024-06-20 LAB — GLUCOSE, CAPILLARY
Glucose-Capillary: 178 mg/dL — ABNORMAL HIGH (ref 70–99)
Glucose-Capillary: 205 mg/dL — ABNORMAL HIGH (ref 70–99)
Glucose-Capillary: 312 mg/dL — ABNORMAL HIGH (ref 70–99)
Glucose-Capillary: 277 mg/dL — ABNORMAL HIGH (ref 70–99)

## 2024-06-20 NOTE — Inpatient Diabetes Management (Signed)
 Inpatient Diabetes Program Recommendations  AACE/ADA: New Consensus Statement on Inpatient Glycemic Control (2015)  Target Ranges:  Prepandial:   less than 140 mg/dL      Peak postprandial:   less than 180 mg/dL (1-2 hours)      Critically ill patients:  140 - 180 mg/dL   Lab Results  Component Value Date   GLUCAP 205 (H) 06/20/2024   HGBA1C 7.4 (H) 05/23/2024    Review of Glycemic Control  Latest Reference Range & Units 06/19/24 06:00 06/19/24 08:16 06/19/24 12:39 06/19/24 16:20 06/19/24 21:45 06/20/24 06:49 06/20/24 12:05  Glucose-Capillary 70 - 99 mg/dL 804 (H) 800 (H) 789 (H) 348 (H) 344 (H) 178 (H) 205 (H)  (H): Data is abnormally high Diabetes history: Type 2 DM Outpatient Diabetes medications: none Current orders for Inpatient glycemic control: Novolog  0-9 units TID Prednisone  40 mg every day  Inpatient Diabetes Program Recommendations:    Consider adding Novolog  3 units TID (assuming patient consumes >50% of meals)  Thanks, Tinnie Minus, MSN, RNC-OB Diabetes Coordinator [phone] (8a-5p)

## 2024-06-20 NOTE — Plan of Care (Signed)
  Problem: Education: Goal: Ability to describe self-care measures that may prevent or decrease complications (Diabetes Survival Skills Education) will improve Outcome: Progressing   Problem: Skin Integrity: Goal: Risk for impaired skin integrity will decrease Outcome: Progressing   Problem: Pain Managment: Goal: General experience of comfort will improve and/or be controlled Outcome: Progressing   Problem: Safety: Goal: Ability to remain free from injury will improve Outcome: Progressing   Problem: Skin Integrity: Goal: Risk for impaired skin integrity will decrease Outcome: Progressing

## 2024-06-20 NOTE — Assessment & Plan Note (Signed)
 Close to oliguric UOP, will continue to monitor but appears to be advancing p.o. intake.  Now planning for renal biopsy inpatient, likely this Wednesday (910/25) after ASA washout. - Nephrology consultation, appreciate recommendations             - Planing on Renal biopsy on Wednesday 06/22/24 ( 5 day after ASA 81 mg stopped ) - Renal diet with 1200 ml fluid restriction  - Strict I's and O's - Daily RFP

## 2024-06-20 NOTE — Progress Notes (Signed)
 Occupational Therapy Treatment Patient Details Name: Edward [name] MRN: [id number] DOB: November 30, 1970 Today's Date: 06/20/2024   History of present illness Pt is a 53 y.o. male presenting 06/15/24 with R groin pain that radiates to lower back and causing Edward weakness and inability to walk.  MRI showed increased size L4-5 disc extrusion and unchanged L5-S1 central disc protrusion.  PMH: DMII, HTN, HLD, ankylosing spondylitis in spine, bipolar 1, CKD V (pending HD), gastric bypass, L thalamic CVA 05/22/24, remote infarcts L basal ganglia and L thalamus   OT comments  Pt progressing well toward goals this session, able to ambulate short distances in room without AD, and minimal reliance on RW for hallway distance mobility. Pt performing LB ADLs with supervision, per pt and RN pt showering without assist this AM (seated and standing). Pt educated on energy conservation strategies and activity pacing for home. Updating d/c recommendation due to progression, will continue to follow acutely, recommend HHOT at d/c.       If plan is discharge home, recommend the following:  A little help with walking and/or transfers;A little help with bathing/dressing/bathroom;Assistance with cooking/housework;Assist for transportation   Equipment Recommendations  Other (comment) (RW)    Recommendations for Other Services      Precautions / Restrictions Restrictions Weight Bearing Restrictions Per Provider Order: No       Mobility Bed Mobility               General bed mobility comments: OOB in chair    Transfers Overall transfer level: Needs assistance Equipment used: Rolling walker (2 wheels), None Transfers: Sit to/from Stand Sit to Stand: Supervision           General transfer comment: pt ambulating short distances in room without AD, RW for hallway distance, but is able to stand at end of hallway without BUE support     Balance Overall balance assessment: Needs assistance   Sitting  balance-Leahy Scale: Normal     Standing balance support: Bilateral upper extremity supported, During functional activity, Reliant on assistive device for balance Standing balance-Leahy Scale: Fair Standing balance comment: RW for intermittent stability                           ADL either performed or assessed with clinical judgement   ADL Overall ADL's : Needs assistance/impaired                     Lower Body Dressing: Supervision/safety;Sitting/lateral leans Lower Body Dressing Details (indicate cue type and reason): dons pants sitting in chair Toilet Transfer: Supervision/safety;Ambulation Toilet Transfer Details (indicate cue type and reason): simulated via functional mobiltiy         Functional mobility during ADLs: Supervision/safety;Rolling walker (2 wheels)      Extremity/Trunk Assessment Upper Extremity Assessment Upper Extremity Assessment: Right hand dominant;Overall [facility] for tasks assessed   Lower Extremity Assessment Lower Extremity Assessment: Defer to PT evaluation        Vision   Vision Assessment?: No apparent visual deficits   Perception     Praxis     Communication Communication Communication: No apparent difficulties   Cognition Arousal: Alert Behavior During Therapy: WFL for tasks assessed/performed               OT - Cognition Comments: tangential at times, needs min cues for redirection/focus on task at hand  Following commands: Intact Following commands impaired: Follows multi-step commands with increased time      Cueing   Cueing Techniques: Verbal cues  Exercises      Shoulder Instructions       General Comments VSS    Pertinent Vitals/ Pain       Pain Assessment Pain Assessment: Faces Pain Score: 2  Faces Pain Scale: Hurts a little bit Pain Location: R posterior hip and flank Pain Descriptors / Indicators: Discomfort Pain Intervention(s): Repositioned, Monitored during  session  Home Living                                          Prior Functioning/Environment              Frequency  Min 2X/week        Progress Toward Goals  OT Goals(current goals can now be found in the care plan section)  Progress towards OT goals: Progressing toward goals  Acute Rehab OT Goals Patient Stated Goal: to return home after biopsy OT Goal Formulation: With patient Time For Goal Achievement: 06/30/24 Potential to Achieve Goals: Good ADL Goals Pt Will Perform Grooming: with contact guard assist;standing Pt Will Perform Lower Body Bathing: with contact guard assist;sit to/from stand Pt Will Perform Lower Body Dressing: with contact guard assist;sit to/from stand Pt Will Transfer to Toilet: with supervision;ambulating;regular height toilet;grab bars Pt Will Perform Toileting - Clothing Manipulation and hygiene: with supervision;sit to/from stand;sitting/lateral leans  Plan      Co-evaluation                 AM-PAC OT 6 Clicks Daily Activity     Outcome Measure   Help from another person eating meals?: None Help from another person taking care of personal grooming?: None Help from another person toileting, which includes using toliet, bedpan, or urinal?: A Little Help from another person bathing (including washing, rinsing, drying)?: A Little Help from another person to put on and taking off regular upper body clothing?: A Little Help from another person to put on and taking off regular lower body clothing?: A Little 6 Click Score: 20    End of Session Equipment Utilized During Treatment: Gait belt;Rolling walker (2 wheels)  OT Visit Diagnosis: Unsteadiness on feet (R26.81);Other abnormalities of gait and mobility (R26.89);Muscle weakness (generalized) (M62.81);Ataxia, unspecified (R27.0);Other symptoms and signs involving cognitive function   Activity Tolerance Patient tolerated treatment well   Patient Left with call  bell/phone within reach;in chair   Nurse Communication Mobility status        Time: 8470-8448 OT Time Calculation (min): 22 min  Charges: OT General Charges $OT Visit: 1 Visit OT Treatments $Self Care/Home Management : 8-22 mins  Adaria Hole K, OTD, OTR/L SecureChat Preferred Acute Rehab [phone]   Edward [name] 06/20/2024, 4:57 PM

## 2024-06-20 NOTE — Assessment & Plan Note (Signed)
 HTN : Continue home amLODipine  10 mg daily, Carrvedilol 3.125 mg BID Hx of Stroke : Hold ASA 81 mg daily  DMII : SSI HLD : Continue Ezetimibe  10 mg daily Bipolar 1 disorder : Continue QUEtiapine  25 mg QHS Migraines OCD

## 2024-06-20 NOTE — Progress Notes (Signed)
 Inpatient Rehab Admissions Coordinator:  Pt is not medically ready for potential CIR admission. Pt with pending renal biopsy on Wednesday. Will continue to follow.   Tinnie Yvone Cohens, MS, CCC-SLP Admissions Coordinator [phone]

## 2024-06-20 NOTE — Assessment & Plan Note (Signed)
 Last BM 06/20/24, continue on current regimen. -Bowel regimen: MiraLax  17 g BID, senna daily

## 2024-06-20 NOTE — Progress Notes (Signed)
 Daily Progress Note Intern Pager: [phone]  Patient name: Edward [name] Medical record number: [id number] Date of birth: November 14, 1970 Age: 53 y.o. Gender: male  Primary Care Provider: Donzetta Rollene BRAVO, MD Consultants: Nephrology, Dietitian  Code Status: Full  Pt Overview and Major Events to Date:  9/4 : Admitted for back pain   Pertinent PMH/PSH includes Ankylosing spondylitis Chronic kidney disease, stage 3b Chronic pain Stroke Gastric Bypass (07/2015) Obesity HTN DMII HLD Bipolar 1 disorder Migraines OCD Rheumatic fever at age 46   Assessment & Plan 53 year old male with PMHx ankylosing spondylitis, CKDV, CVA, HTN, T2DM and, HLD, bipolar 1 and migraines presenting with back pain in the setting of AK flare.  Assessment & Plan Ankylosing spondylitis flare Symptomatically improved, feels stable on PO pain meds and agreeable to d/c IV pain meds.  Continue to mobilize and plan for acute rehab. - Continue prednisone  taper - Pain regimen: - Acetaminophen  650 mg every 8 hours scheduled - Oxycodone  5-10 mg every 4 hours as needed for moderate/severe pain - D/c Dilaudid  0.5 mg IV every 6 hours - Lidocaine  patches - Tizanidine  4 mg every 8 hours as needed - Need outpatient f/u with Rheumatology  - PT/OT : recommending CRI  CKD (chronic kidney disease), stage V (HCC) Close to oliguric UOP, will continue to monitor but appears to be advancing p.o. intake.  Now planning for renal biopsy inpatient, likely this Wednesday (910/25) after ASA washout. - Nephrology consultation, appreciate recommendations             - Planing on Renal biopsy on Wednesday 06/22/24 ( 5 day after ASA 81 mg stopped ) - Renal diet with 1200 ml fluid restriction  - Strict I's and O's - Daily RFP  Hyperkalemia Hx of CKD with mildly hyperkalemia. Received Lokelma  x 2 since admission - Daily BMP Constipation Last BM 06/20/24, continue on current regimen. -Bowel regimen: MiraLax  17 g BID, senna  daily Chronic health problem HTN : Continue home amLODipine  10 mg daily, Carrvedilol 3.125 mg BID Hx of Stroke : Hold ASA 81 mg daily  DMII : SSI HLD : Continue Ezetimibe  10 mg daily Bipolar 1 disorder : Continue QUEtiapine  25 mg QHS Migraines OCD  FEN/GI: Renal diet , Protonix  EC 40 mg daily PPx: SQH Dispo:SNF pending clinical improvement .  Subjective:  [name] is a 53 y.o. male presenting with back pain . He is doing well this morning, OOB to bathroom for cleaning up. States he had a great meal yesterday and his pain is under control. Pending CIR placement and Kidney Biopsy.   Objective: Temp:  [97.6 F (36.4 C)-98.2 F (36.8 C)] 98.2 F (36.8 C) (09/08 0807) Pulse Rate:  [61-70] 63 (09/08 0807) Resp:  [16-18] 16 (09/08 0807) BP: (126-141)/(77-86) 141/83 (09/08 0807) SpO2:  [97 %-100 %] 100 % (09/08 0807)  Physical Exam Cardiovascular:     Rate and Rhythm: Normal rate.     Pulses: Normal pulses.  Pulmonary:     Effort: Pulmonary effort is normal.     Breath sounds: Normal breath sounds.  Abdominal:     Palpations: Abdomen is soft.  Musculoskeletal:        General: Tenderness (back) present.  Skin:    General: Skin is warm and dry.     Capillary Refill: Capillary refill takes less than 2 seconds.  Neurological:     Mental Status: He is oriented to person, place, and time. Mental status is at baseline.  Sensory: Sensory deficit (residual right sensory changes on R arm and leg,) present.  Psychiatric:        Mood and Affect: Mood normal.        Behavior: Behavior normal.   Laboratory: Most recent CBC Lab Results  Component Value Date   WBC 15.0 (H) 06/18/2024   HGB 10.2 (L) 06/18/2024   HCT 32.5 (L) 06/18/2024   MCV 89.3 06/18/2024   PLT 477 (H) 06/18/2024   Most recent BMP    Latest Ref Rng & Units 06/20/2024    6:53 AM  BMP  Glucose 70 - 99 mg/dL 807   BUN 6 - 20 mg/dL 881   Creatinine 9.38 - 1.24 mg/dL 4.87   Sodium 864 - 854 mmol/L 134    Potassium 3.5 - 5.1 mmol/L 5.1   Chloride 98 - 111 mmol/L 104   CO2 22 - 32 mmol/L 18   Calcium  8.9 - 10.3 mg/dL 8.0     Imaging/Diagnostic Tests: EXAM: MRI SACRUM WITHOUT CONTRAST IMPRESSION: 1. Symmetric marrow edema along the sacroiliac joints with suspected erosions, favoring sacroiliitis related to ankylosing spondylitis. 2. Tendinopathy and potentially mild partial tearing of the right proximal hamstring tendon. Minimal tendinopathy of the left proximal hamstring tendon. 3. Trace edema in the bilateral hip adductor musculature adjacent to the pubis. 4. Subtle endplate edema at O5-4 and L5-S1.  Suzen Houston NOVAK, DO 06/20/2024, 9:39 AM  PGY-1, [facility] Family Medicine FPTS Intern pager: [phone], text pages welcome Secure chat group [facility] Teaching Service

## 2024-06-20 NOTE — Consult Note (Signed)
 Chief Complaint: Rapidly progressing CKD  Referring Provider(s): Dr. Melia Dr. Dennise  Supervising Physician: Vanice Revel  Patient Status: [facility] - In-pt  History of Present Illness: Edward Cangelosi is a 53 y.o. male with PMH significant for CKD, DM2, HTN, ankylosing spondylosis (on steroid), perf gastric ulcer May 2025. He presented to the ED 9/3 for severe neck and back pain worsening over the last several weeks. He has known CKD, stage V likely due to nephrotic syndrome and is pending outpatient kidney biopsy to consider kidney transplant. This was scheduled for 9/24 with IR. However, given rapidly progressing CKD noted during hospitalization, Dr. Dennise with nephrology is recommending to biopsy be inpatient. IR contacted and patient approved for biopsy pending ASA washout (LD 9/5).  Does not wear CPAP or use supplemental home O2 .  Denies fever, chills, SOB, CP, sore throat, N/V, abd pain, blood in stool or urine, abnormal bruising, leg swelling. Endorses back and leg pain.   He is pleasant and interested in his care, sitting in chair after having just worked with PT.  Allergies Reviewed:  Demerol [meperidine], Nsaids, Tapentadol, and Tramadol    Patient is Full Code  Past Medical History:  Diagnosis Date   Ankylosing spondylitis (HCC) 1993   Bipolar depression (HCC) 2005   CVA (cerebral vascular accident) (HCC) 02/2024   DM (diabetes mellitus), type 2 (HCC) 2007   Generalized anxiety disorder 1997   High-functioning autism spectrum disorder 2005   Renal disorder    CKD stage 4   Rheumatic fever age 56    Past Surgical History:  Procedure Laterality Date   arthroscopic surgery Left years ago   both knee cartlidge surgery     GASTRIC ROUX-EN-Y N/A 08/06/2015   Procedure: LAPAROSCOPIC ROUX-EN-Y GASTRIC BYPASS WITH UPPER ENDOSCOPY;  Surgeon: Camellia Blush, MD;  Location: [facility];  Service: General;  Laterality: N/A;   HIP SURGERY Left 1986   with knee surgery   KNEE SURGERY  Right 1986   benign tumor with bone graft    UPPER GI ENDOSCOPY  08/06/2015   Procedure: UPPER GI ENDOSCOPY;  Surgeon: Camellia Blush, MD;  Location: [facility];  Service: General;;      Medications: Prior to Admission medications   Medication Sig Start Date End Date Taking? Authorizing Provider  acetaminophen  (ACETAMINOPHEN  8 HOUR) 650 MG CR tablet Take 1,300 mg by mouth 2 (two) times daily.   Yes [provider]  amLODipine  (NORVASC ) 10 MG tablet Take 1 tablet (10 mg total) by mouth daily. 05/09/24  Yes Myrna Bitters, DO  aspirin  EC 81 MG tablet Take 1 tablet (81 mg total) by mouth daily. Swallow whole. 05/25/24  Yes Everhart, Kirstie, DO  atorvastatin  (LIPITOR) 40 MG tablet Take 1 tablet (40 mg total) by mouth daily. 05/25/24  Yes Everhart, Kirstie, DO  carvedilol  (COREG ) 3.125 MG tablet Take 3.125 mg by mouth 2 (two) times daily. 05/18/24  Yes [provider]  diclofenac Sodium (VOLTAREN) 1 % GEL Apply 4 g topically in the morning and at bedtime. Apply topically to neck and lumbar spine twice per day.   Yes [provider]  ezetimibe  (ZETIA ) 10 MG tablet Take 1 tablet (10 mg total) by mouth daily. 05/25/24  Yes Everhart, Kirstie, DO  ferrous sulfate  325 (65 FE) MG tablet Take 1 tablet (325 mg total) by mouth daily with breakfast. 03/21/24  Yes Pray, Rollene BRAVO, MD  pantoprazole  (PROTONIX ) 40 MG tablet Take 1 tablet (40 mg total) by mouth daily. 05/25/24  Yes Everhart, Kirstie, DO  QUEtiapine  (SEROQUEL ) 25 MG tablet Take 1 tablet 25mg  at night for three days, if not oversedated can increase to 2 tablets 50mg  total at night. Patient taking differently: 50 mg. 06/03/24  Yes Pray, Rollene BRAVO, MD     Family History  Problem Relation Age of Onset   Hyperlipidemia Mother    Hypertension Mother    Breast cancer Mother    Thyroid  disease Father    Early death Father 67       Cancer   Diabetes Father    Autoimmune disease Father        Ankylosing Spondylitis   Squamous cell  carcinoma Father    Diabetes Brother    Hyperlipidemia Brother    Hypertension Brother    Thyroid  disease Brother    Alcohol abuse Brother    Heart disease Maternal Grandmother    Stroke Maternal Grandmother    Heart disease Maternal Grandfather    Autoimmune disease Paternal Grandfather     Social History   Socioeconomic History   Marital status: Married    Spouse name: Not on file   Number of children: Not on file   Years of education: Not on file   Highest education level: Doctorate  Occupational History   Not on file  Tobacco Use   Smoking status: Former    Current packs/day: 0.00    Average packs/day: 0.3 packs/day for 10.0 years (2.5 ttl pk-yrs)    Types: Cigarettes    Start date: 04/16/2002    Quit date: 04/16/2012    Years since quitting: 12.1   Smokeless tobacco: Never  Vaping Use   Vaping status: Never Used  Substance and Sexual Activity   Alcohol use: No    Alcohol/week: 0.0 standard drinks of alcohol   Drug use: No   Sexual activity: Yes    Partners: Female  Other Topics Concern   Not on file  Social History Narrative   02/22/16   Married: Damien Like   Children: Rosa (anxiety/depression), [name] (Autism, non-verbal), Wilbern Credit   Education: PhD in [place] [employer]   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   Social Drivers of Health   Financial Resource Strain: Patient Declined (03/20/2024)   Overall Financial Resource Strain (CARDIA)    Difficulty of Paying Living Expenses: Patient declined  Food Insecurity: No Food Insecurity (06/16/2024)   Hunger Vital Sign    Worried About Running Out of Food in the Last Year: Never true    Ran Out of Food in the Last Year: Never true  Transportation Needs: No Transportation Needs (06/16/2024)   PRAPARE - Administrator, Civil Service (Medical): No    Lack of Transportation (Non-Medical): No  Physical Activity: Insufficiently Active (03/20/2024)   Exercise Vital Sign    Days of Exercise per Week:  2 days    Minutes of Exercise per Session: 20 min  Stress: Stress Concern Present (03/20/2024)   [employer] of Occupational Health - Occupational Stress Questionnaire    Feeling of Stress : Rather much  Social Connections: Socially Isolated (06/16/2024)   Social Connection and Isolation Panel    Frequency of Communication with Friends and Family: Three times a week    Frequency of Social Gatherings with Friends and Family: Twice a week    Attends Religious Services: Never    Database administrator or Organizations: No    Attends Banker Meetings: Never    Marital Status: Divorced  Review of Systems: A 12 point ROS discussed and pertinent positives are indicated in the HPI above.  All other systems are negative.    Vital Signs: BP (!) 141/83 (BP Location: Left Arm)   Pulse 63   Temp 98.2 F (36.8 C)   Resp 16   Ht 6' (1.829 m)   Wt 270 lb (122.5 kg)   SpO2 100%   BMI 36.62 kg/m     Physical Exam HENT:     Mouth/Throat:     Mouth: Mucous membranes are moist.     Pharynx: Oropharynx is clear.  Cardiovascular:     Rate and Rhythm: Normal rate and regular rhythm.     Pulses: Normal pulses.     Heart sounds: Normal heart sounds.  Pulmonary:     Effort: Pulmonary effort is normal.     Breath sounds: Normal breath sounds.  Abdominal:     General: There is no distension.     Palpations: Abdomen is soft.     Tenderness: There is no abdominal tenderness.  Musculoskeletal:     Right lower leg: No edema.     Left lower leg: No edema.  Skin:    General: Skin is warm and dry.     Findings: No lesion or rash.     Comments: No rash or lesion over region of planned puncture  Neurological:     Mental Status: He is alert and oriented to person, place, and time.  Psychiatric:        Mood and Affect: Mood normal.        Behavior: Behavior normal.        Thought Content: Thought content normal.        Judgment: Judgment normal.     Imaging: MR SACRUM SI  JOINTS WO CONTRAST Result Date: 06/16/2024 CLINICAL DATA:  Acute onset groin pain. History of ankylosing spondylitis. EXAM: MRI SACRUM WITHOUT CONTRAST TECHNIQUE: Multiplanar, multisequence MR imaging of the sacroiliac joints was performed. No intravenous contrast was administered. COMPARISON:  CT pelvis 05/22/2024 and pelvic radiographs from 06/15/2024 FINDINGS: OSSEOUS STRUCTURES AND JOINTS: Symmetric marrow edema is identified along the sacroiliac joints especially anteriorly along the sacral ala and along adjacent portions of the iliac bones, with suspected erosions as suggested on image 14 series 2. The symmetric appearance favors sacroiliitis related to ankylosing spondylitis. Trace edema signal anterior to the SI joints on image 13 series 3 without overt SI joint effusion. Subtle endplate edema at O5-4 and L5-S1. Small hemangiomas in the L4 and L5 vertebral bodies. Marrow edema in the right ischium adjacent to tendinopathy and a suspected mild partial tear of the right hamstring tendon. MUSCULOTENDINOUS: Tendinopathy and potentially mild partial tearing of the right proximal hamstring tendon. Minimal tendinopathy of the left proximal hamstring tendon. Trace edema in the bilateral hip adductor musculature adjacent to the pubis as on image 37 series 5. SACRAL PLEXUS: No impinging lesion is identified involving the sacral plexus or proximal sciatic nerves. OTHER: No supplemental non-categorized findings. IMPRESSION: 1. Symmetric marrow edema along the sacroiliac joints with suspected erosions, favoring sacroiliitis related to ankylosing spondylitis. 2. Tendinopathy and potentially mild partial tearing of the right proximal hamstring tendon. Minimal tendinopathy of the left proximal hamstring tendon. 3. Trace edema in the bilateral hip adductor musculature adjacent to the pubis. 4. Subtle endplate edema at O5-4 and L5-S1. Electronically Signed   By: Ryan Salvage M.D.   On: 06/16/2024 08:11   MR LUMBAR SPINE  WO CONTRAST Result Date: 06/15/2024  EXAM: MRI LUMBAR SPINE 06/15/2024 06:24:00 PM TECHNIQUE: Multiplanar multisequence MRI of the lumbar spine was performed without the administration of intravenous contrast. COMPARISON: 07/23/2021 CLINICAL HISTORY: Myelopathy, acute, lumbar spine. Table formatting from the original note was not included. Acute onset groin pain, history of ankylosing spondylitis, unable to walk now. FINDINGS: BONES AND ALIGNMENT: Grade 1 retrolisthesis at L5-S1. SPINAL CORD: The conus terminates normally. SOFT TISSUES: No paraspinal mass. L1-L2: No significant disc herniation. No spinal canal stenosis or neural foraminal narrowing. L2-L3: No significant disc herniation. No spinal canal stenosis or neural foraminal narrowing. L3-L4: No significant disc herniation. No spinal canal stenosis or neural foraminal narrowing. L4-L5: Increased size of right subarticular disc extrusion with minimal inferior migration and narrowing of the right lateral recess. L5-S1: Unchanged small central disc protrusion. No central spinal canal stenosis. Unchanged moderate bilateral neuroforaminal stenosis. IMPRESSION: 1. L4-5: Increased size of right subarticular disc extrusion with minimal inferior migration and narrowing of the right lateral recess. 2. L5-S1: Unchanged small central disc protrusion without central spinal canal stenosis. Unchanged moderate bilateral neuroforaminal stenosis. Electronically signed by: Franky Stanford MD 06/15/2024 08:20 PM EDT RP Workstation: [id number]   DG Pelvis Portable Result Date: 06/15/2024 CLINICAL DATA:  R hip pain EXAM: PORTABLE PELVIS 1-2 VIEWS COMPARISON:  May 22, 2024 FINDINGS: Unchanged appearance of the left iliac wing.No evidence of pelvic fracture or diastasis.No acute hip fracture or dislocation.There is no evidence of arthropathy or other focal bone abnormality.Soft tissues are unremarkable. IMPRESSION: No acute fracture, pelvic bone diastasis, or dislocation.  Electronically Signed   By: Rogelia Myers M.D.   On: 06/15/2024 12:45   DG Chest Portable 1 View Result Date: 06/15/2024 CLINICAL DATA:  weakness EXAM: PORTABLE CHEST - 1 VIEW COMPARISON:  June 22, 2015 FINDINGS: No focal airspace consolidation, pleural effusion, or pneumothorax. No cardiomegaly. Tortuous aorta with aortic atherosclerosis. No acute fracture or destructive lesions. Multilevel thoracic osteophytosis. IMPRESSION: No acute cardiopulmonary abnormality. Electronically Signed   By: Rogelia Myers M.D.   On: 06/15/2024 12:43   CT Head Wo Contrast Result Date: 06/15/2024 EXAM: CT HEAD WITHOUT CONTRAST 06/15/2024 10:51:48 AM TECHNIQUE: CT of the head was performed without the administration of intravenous contrast. Automated exposure control, iterative reconstruction, and/or weight based adjustment of the mA/kV was utilized to reduce the radiation dose to as low as reasonably achievable. COMPARISON: CT of the head dated 05/22/2024. CLINICAL HISTORY: Stroke, follow up; recent L thalamic stroke, worse R sided weakness. Triage notes; Pt bib PTAR from home. Complains of general weakness. Pt has been progressively getting too weak to walk. Around 0100 was the last time the pt states he was able to walk. 8/10 right groin pain that radiates to lower back. Hx of ankylosing spondylitis. FINDINGS: BRAIN AND VENTRICLES: No acute hemorrhage. No evidence of acute infarct. No hydrocephalus. No extra-axial collection. No mass effect or midline shift. 2 focal lacunar infarcts present within the left thalamus are now slightly less conspicuous and are both seen on image 16 of series 3. ORBITS: No acute abnormality. SINUSES: No acute abnormality. SOFT TISSUES AND SKULL: No acute soft tissue abnormality. No skull fracture. VASCULATURE: Mild calcific atheromatous disease within the carotid siphons. IMPRESSION: 1. No acute intracranial abnormality or adverse interval change. 2. Slightly less conspicuous left thalamic  lacunar infarcts compared to prior study dated 05/22/2024. Electronically signed by: Evalene Coho MD 06/15/2024 11:17 AM EDT RP Workstation: [id number]   ECHOCARDIOGRAM COMPLETE Result Date: 05/23/2024    ECHOCARDIOGRAM REPORT   Patient Name:  Elsie Like Date of Exam: 05/23/2024 Medical Rec #:  [id number]     Height:       71.0 in Accession #:    [id number]    Weight:       302.7 lb Date of Birth:  July 14, 1971     BSA:          2.515 m Patient Age:    53 years      BP:           152/96 mmHg Patient Gender: M             HR:           85 bpm. Exam Location:  Inpatient Procedure: 2D Echo, Color Doppler, Cardiac Doppler and Saline Contrast Bubble            Study (Both Spectral and Color Flow Doppler were utilized during            procedure). Indications:    Stroke I63.9  History:        Patient has no prior history of Echocardiogram examinations.  Sonographer:    Tinnie Gosling RDCS Referring Phys: DAMIEN MILLER IMPRESSIONS  1. Left ventricular ejection fraction, by estimation, is 60 to 65%. The left ventricle has normal function. The left ventricle has no regional wall motion abnormalities. Left ventricular diastolic parameters were normal.  2. Right ventricular systolic function is normal. The right ventricular size is normal.  3. The mitral valve is normal in structure. No evidence of mitral valve regurgitation. No evidence of mitral stenosis.  4. The aortic valve is tricuspid. Aortic valve regurgitation is not visualized. No aortic stenosis is present.  5. The inferior vena cava is normal in size with greater than 50% respiratory variability, suggesting right atrial pressure of 3 mmHg.  6. Agitated saline contrast bubble study was negative, with no evidence of any interatrial shunt. Comparison(s): No prior Echocardiogram. Conclusion(s)/Recommendation(s): No intracardiac source of embolism detected on this transthoracic study. Consider a transesophageal echocardiogram to exclude cardiac source of embolism if  clinically indicated. FINDINGS  Left Ventricle: Left ventricular ejection fraction, by estimation, is 60 to 65%. The left ventricle has normal function. The left ventricle has no regional wall motion abnormalities. The left ventricular internal cavity size was normal in size. There is  borderline left ventricular hypertrophy. Left ventricular diastolic parameters were normal. Right Ventricle: The right ventricular size is normal. No increase in right ventricular wall thickness. Right ventricular systolic function is normal. Left Atrium: Left atrial size was normal in size. Right Atrium: Right atrial size was normal in size. Pericardium: There is no evidence of pericardial effusion. Mitral Valve: The mitral valve is normal in structure. There is mild thickening of the mitral valve leaflet(s). Normal mobility of the mitral valve leaflets. Mild mitral annular calcification. No evidence of mitral valve regurgitation. No evidence of mitral valve stenosis. Tricuspid Valve: The tricuspid valve is normal in structure. Tricuspid valve regurgitation is not demonstrated. No evidence of tricuspid stenosis. Aortic Valve: The aortic valve is tricuspid. Aortic valve regurgitation is not visualized. No aortic stenosis is present. Aortic valve mean gradient measures 2.0 mmHg. Aortic valve peak gradient measures 3.4 mmHg. Pulmonic Valve: The pulmonic valve was normal in structure. Pulmonic valve regurgitation is not visualized. No evidence of pulmonic stenosis. Aorta: The aortic root and ascending aorta are structurally normal, with no evidence of dilitation. Venous: The inferior vena cava is normal in size with greater than 50% respiratory variability, suggesting right atrial pressure of  3 mmHg. IAS/Shunts: The atrial septum is grossly normal. Agitated saline contrast was given intravenously to evaluate for intracardiac shunting. Agitated saline contrast bubble study was negative, with no evidence of any interatrial shunt.  LEFT  VENTRICLE PLAX 2D LVIDd:         4.20 cm   Diastology LVIDs:         2.40 cm   LV e' medial:    6.53 cm/s LV PW:         1.20 cm   LV E/e' medial:  9.4 LV IVS:        1.30 cm   LV e' lateral:   9.79 cm/s LVOT diam:     2.00 cm   LV E/e' lateral: 6.3 LVOT Area:     3.14 cm  RIGHT VENTRICLE             IVC RV S prime:     14.40 cm/s  IVC diam: 1.40 cm TAPSE (M-mode): 2.1 cm LEFT ATRIUM             Index        RIGHT ATRIUM           Index LA diam:        3.80 cm 1.51 cm/m   RA Area:     14.50 cm LA Vol (A2C):   45.1 ml 17.94 ml/m  RA Volume:   30.50 ml  12.13 ml/m LA Vol (A4C):   45.7 ml 18.17 ml/m LA Biplane Vol: 46.3 ml 18.41 ml/m  AORTIC VALVE AV Vmax:      92.60 cm/s AV Vmean:     65.200 cm/s AV VTI:       0.174 m AV Peak Grad: 3.4 mmHg AV Mean Grad: 2.0 mmHg  AORTA Ao Root diam: 2.90 cm Ao Asc diam:  3.70 cm MITRAL VALVE MV Area (PHT): 6.12 cm    SHUNTS MV Decel Time: 124 msec    Systemic Diam: 2.00 cm MV E velocity: 61.30 cm/s MV A velocity: 99.00 cm/s MV E/A ratio:  0.62 Sunit Tolia Electronically signed by Madonna Large Signature Date/Time: 05/23/2024/1:43:33 PM    Final    MR ANGIO HEAD WO CONTRAST Result Date: 05/23/2024 CLINICAL DATA:  Follow-up examination for stroke. EXAM: MRA NECK WITHOUT CONTRAST MRA HEAD WITHOUT CONTRAST TECHNIQUE: Angiographic images of the Circle of Willis were acquired using MRA technique without intravenous contrast. COMPARISON:  None Available. FINDINGS: MRA NECK FINDINGS Aortic arch: Visualized aortic arch within normal limits for caliber with standard branch pattern. No visible stenosis or other abnormality about the origin the great vessels. Right carotid system: Right common and internal carotid arteries are patent with antegrade flow. No evidence for dissection. No hemodynamically significant stenosis about the right carotid artery system. Left carotid system: Left common and internal carotid arteries are patent with antegrade flow. No evidence for dissection. No  hemodynamically significant stenosis about the left carotid artery system. Vertebral arteries: Both vertebral arteries arise from subclavian arteries. Left vertebral artery dominant. Vertebral arteries are patent with antegrade flow. No evidence for dissection or stenosis. Other: None. MRA HEAD FINDINGS Anterior circulation: Both internal carotid arteries are widely patent through the siphons without stenosis or other abnormality. A1 segments patent bilaterally. Normal anterior communicating artery complex. Anterior cerebral arteries patent without significant stenosis. No M1 stenosis or occlusion. No proximal MCA branch occlusion. Distal MCA branches perfused and symmetric. Posterior circulation: Focal mild-to-moderate stenosis seen involving the proximal dominant left V4 segment (series 1030, image 1).  Mildly heterogeneous signal intensity within the left vertebral artery at this level suspected to be related to turbulent flow. Left PICA patent. Diminutive right vertebral artery largely terminates in PICA, although a tiny branch is seen ascending towards the vertebrobasilar junction. Right PICA patent as well. Basilar patent without stenosis. Superior cerebral arteries patent bilaterally. Both PCAs primarily supplied via the basilar. Left PCA patent to its distal aspect without stenosis. Moderate long segment narrowing/stenosis of the right P2 segment (series 1024, image 3). Right PCA remains patent to its distal aspect. Anatomic variants: As above. Other: No intracranial aneurysm. IMPRESSION: MRA HEAD: 1. Negative intracranial MRA for large vessel occlusion. 2. Moderate long segment narrowing/stenosis of the right P2 segment. 3. Focal mild-to-moderate stenosis involving the proximal dominant left V4 segment. MRA NECK: Normal MRA of the neck. Electronically Signed   By: Morene Hoard M.D.   On: 05/23/2024 02:05   MR ANGIO NECK WO CONTRAST Result Date: 05/23/2024 CLINICAL DATA:  Follow-up examination for  stroke. EXAM: MRA NECK WITHOUT CONTRAST MRA HEAD WITHOUT CONTRAST TECHNIQUE: Angiographic images of the Circle of Willis were acquired using MRA technique without intravenous contrast. COMPARISON:  None Available. FINDINGS: MRA NECK FINDINGS Aortic arch: Visualized aortic arch within normal limits for caliber with standard branch pattern. No visible stenosis or other abnormality about the origin the great vessels. Right carotid system: Right common and internal carotid arteries are patent with antegrade flow. No evidence for dissection. No hemodynamically significant stenosis about the right carotid artery system. Left carotid system: Left common and internal carotid arteries are patent with antegrade flow. No evidence for dissection. No hemodynamically significant stenosis about the left carotid artery system. Vertebral arteries: Both vertebral arteries arise from subclavian arteries. Left vertebral artery dominant. Vertebral arteries are patent with antegrade flow. No evidence for dissection or stenosis. Other: None. MRA HEAD FINDINGS Anterior circulation: Both internal carotid arteries are widely patent through the siphons without stenosis or other abnormality. A1 segments patent bilaterally. Normal anterior communicating artery complex. Anterior cerebral arteries patent without significant stenosis. No M1 stenosis or occlusion. No proximal MCA branch occlusion. Distal MCA branches perfused and symmetric. Posterior circulation: Focal mild-to-moderate stenosis seen involving the proximal dominant left V4 segment (series 1030, image 1). Mildly heterogeneous signal intensity within the left vertebral artery at this level suspected to be related to turbulent flow. Left PICA patent. Diminutive right vertebral artery largely terminates in PICA, although a tiny branch is seen ascending towards the vertebrobasilar junction. Right PICA patent as well. Basilar patent without stenosis. Superior cerebral arteries patent  bilaterally. Both PCAs primarily supplied via the basilar. Left PCA patent to its distal aspect without stenosis. Moderate long segment narrowing/stenosis of the right P2 segment (series 1024, image 3). Right PCA remains patent to its distal aspect. Anatomic variants: As above. Other: No intracranial aneurysm. IMPRESSION: MRA HEAD: 1. Negative intracranial MRA for large vessel occlusion. 2. Moderate long segment narrowing/stenosis of the right P2 segment. 3. Focal mild-to-moderate stenosis involving the proximal dominant left V4 segment. MRA NECK: Normal MRA of the neck. Electronically Signed   By: Morene Hoard M.D.   On: 05/23/2024 02:05   MR BRAIN WO CONTRAST Result Date: 05/22/2024 CLINICAL DATA:  Follow-up examination for stroke. EXAM: MRI HEAD WITHOUT CONTRAST TECHNIQUE: Multiplanar, multiecho pulse sequences of the brain and surrounding structures were obtained without intravenous contrast. COMPARISON:  CT from earlier the same day FINDINGS: Brain: Cerebral volume within normal limits. Patchy T2/FLAIR hyperintensity involving the periventricular and deep white matter both  cerebral hemispheres as well as the pons, most like related chronic microvascular ischemic disease, moderate in nature and advanced for age. Few scattered superimposed remote lacunar infarcts present about the left basal ganglia and left thalamus. 8 mm acute ischemic nonhemorrhagic ends are seen involving the ventral left thalamus (series 5, image 81). No significant mass effect. No other evidence for acute or subacute ischemia. Gray-white matter differentiation maintained. No acute intracranial hemorrhage. Focus of chronic hemosiderin staining present at the para falcine frontal parietal region (series 12, image 53), consistent with a small focus of chronic hemorrhage. No mass lesion, midline shift or mass effect. No hydrocephalus or extra-axial fluid collection. Pituitary gland within normal limits. Vascular: Right vertebral  artery hypoplastic and not well seen. Major intracranial vascular flow voids are otherwise maintained. Skull and upper cervical spine: Cranial junction within normal limits. Bone marrow signal intensity normal. No scalp soft tissue abnormality. Sinuses/Orbits: Globes and orbital soft tissues within normal limits. Paranasal sinuses and mastoid air cells are largely clear. Other: None. IMPRESSION: 1. 8 mm acute ischemic nonhemorrhagic infarct involving the ventral left thalamus. 2. Underlying age advanced chronic microvascular ischemic disease with a few scattered remote lacunar infarcts about the left basal ganglia and left thalamus. Electronically Signed   By: Morene Hoard M.D.   On: 05/22/2024 17:48   CT ABDOMEN PELVIS WO CONTRAST Result Date: 05/22/2024 CLINICAL DATA:  Acute renal failure. EXAM: CT ABDOMEN AND PELVIS WITHOUT CONTRAST TECHNIQUE: Multidetector CT imaging of the abdomen and pelvis was performed following the standard protocol without IV contrast. RADIATION DOSE REDUCTION: This exam was performed according to the departmental dose-optimization program which includes automated exposure control, adjustment of the mA and/or kV according to patient size and/or use of iterative reconstruction technique. COMPARISON:  Renal ultrasound 05/09/2024. Report only from remote abdominopelvic CT 12/12/2014. Pelvic and right hip radiographs 05/15/2021. FINDINGS: Lower chest: Clear lung bases. No significant pleural or pericardial effusion. Hepatobiliary: The liver appears unremarkable as imaged in the noncontrast state. No morphologic changes of cirrhosis or focal abnormalities are identified. There is 1.9 cm peripherally calcified gallstone. No evidence of gallbladder wall thickening, surrounding inflammation or biliary ductal dilatation. Pancreas: Unremarkable. No pancreatic ductal dilatation or surrounding inflammatory changes. Spleen: Normal in size without focal abnormality. Adrenals/Urinary Tract:  Both adrenal glands appear normal. No evidence of urinary tract calculus, hydronephrosis or focal renal abnormality on noncontrast imaging. There is mild symmetric perinephric soft tissue stranding bilaterally. The bladder appears unremarkable for its degree of distention. Stomach/Bowel: No enteric contrast administered. There are postsurgical changes consistent with Roux-en-Y gastric bypass. No bowel distension, wall thickening or surrounding inflammation. The appendix appears normal. Mildly prominent stool throughout the colon. Vascular/Lymphatic: There are no enlarged abdominal or pelvic lymph nodes. Small retroperitoneal lymph nodes are not pathologically enlarged. Mild aortoiliac atherosclerosis. Reproductive: The prostate gland and seminal vesicles appear unremarkable. Other: 1.4 cm peripherally calcified nodule in the superior omentum (image 32/3), likely a focus of remote fat necrosis or inflammation. Mildly prominent fat in both inguinal canals. No ascites or pneumoperitoneum. Musculoskeletal: No acute or significant osseous findings. Multilevel spondylosis with chronic appearing osseous foraminal narrowing bilaterally at L5-S1. Probable postsurgical or posttraumatic findings in the lateral aspect of the left iliac crest, unchanged from prior radiographs. The sacroiliac joints appear partially ankylosed bilaterally. IMPRESSION: 1. No acute findings or explanation for the patient's symptoms. 2. No evidence of urinary tract calculus or hydronephrosis. Mild nonspecific perinephric soft tissue stranding bilaterally which could be secondary to underlying renal inflammation. Correlate  clinically. 3. Cholelithiasis without evidence of cholecystitis or biliary ductal dilatation. 4. 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 5.  Aortic Atherosclerosis (ICD10-I70.0). Electronically Signed   By: Elsie Perone M.D.   On: 05/22/2024 12:16   CT Head Wo Contrast Result Date: 05/22/2024 EXAM: CT  HEAD WITHOUT CONTRAST 05/22/2024 11:53:52 AM TECHNIQUE: CT of the head was performed without the administration of intravenous contrast. Automated exposure control, iterative reconstruction, and/or weight based adjustment of the mA/kV was utilized to reduce the radiation dose to as low as reasonably achievable. COMPARISON: None available. CLINICAL HISTORY: Mental status change, unknown cause. FINDINGS: BRAIN AND VENTRICLES: No acute hemorrhage. Gray-white differentiation is preserved. No hydrocephalus. No extra-axial collection. No mass effect or midline shift. 2 separate remote lacunar infarcts are present within the left thalamus. Subtle white matter hypoattenuation is mildly advanced for age. ORBITS: No acute abnormality. SINUSES: No acute abnormality. SOFT TISSUES AND SKULL: No acute soft tissue abnormality. No skull fracture. IMPRESSION: 1. No acute intracranial abnormality. 2. 2 separate remote lacunar infarcts within the left thalamus. 3. Subtle white matter hypoattenuation is mildly advanced for age. This may reflect underlying microvascular ischemia. Electronically signed by: Lonni Necessary MD 05/22/2024 12:11 PM EDT RP Workstation: [id number]    Labs:  CBC: Recent Labs    06/15/24 1100 06/16/24 0323 06/17/24 0642 06/18/24 0632  WBC 14.7* 12.0* 15.4* 15.0*  HGB 11.2* 10.4* 10.5* 10.2*  HCT 36.2* 32.9* 32.9* 32.5*  PLT 469* 421* 440* 477*    COAGS: Recent Labs    06/15/24 1059  INR 1.2  APTT 40*    BMP: Recent Labs    06/17/24 0642 06/18/24 0632 06/19/24 0437 06/20/24 0653  NA 139 134* 132* 134*  K 5.3* 5.2* 5.3* 5.1  CL 107 104 105 104  CO2 16* 16* 15* 18*  GLUCOSE 182* 203* 223* 192*  BUN 101* 113* 120* 118*  CALCIUM  8.8* 8.4* 8.0* 8.0*  CREATININE 5.69* 5.59* 5.37* 5.12*  GFRNONAA 11* 11* 12* 13*    LIVER FUNCTION TESTS: Recent Labs    05/09/24 1159 05/22/24 0837 05/23/24 0130 06/15/24 1059 06/18/24 0632 06/19/24 0437 06/20/24 0653  BILITOT 0.6  0.5  --  0.6  --   --   --   AST 20 19  --  19  --   --   --   ALT 10 9  --  10  --   --   --   ALKPHOS 87 86  --  111  --   --   --   PROT 6.5 6.4*  --  7.3  --   --   --   ALBUMIN 2.7* 2.7*   < > 2.3* 2.1* 1.9* 2.0*   < > = values in this interval not displayed.    TUMOR MARKERS: No results for input(s): AFPTM, CEA, CA199, CHROMGRNA in the last 8760 hours.  Assessment and Plan:  Request for  image guided random renal biopsy approved for 9/10. No contraindications for procedure identified in ROS, physical exam, or review of pre-sedation considerations. INR 1.2 on 9/3. K 5/1 on 9/8. WBC 15 9/6 but has been on steroids. Hgb within acceptable range. VSS, afebrile. BP suitable at this time. BP elevation day of procedure >160/100 would delay procedure.  ASA held. LD 06/17/24. AM SQ heparin  held day of procedure.  Abx not indicated NPO MN prior to procedure    Risks and benefits of random renal biopsy was discussed with the patient and/or patient's family including,  but not limited to bleeding, infection, damage to adjacent structures or low yield requiring additional tests.  All of the questions were answered and there is agreement to proceed.  Consent signed and in chart.   Thank you for allowing our service to participate in Larry Alcock 's care.    Electronically Signed: Laymon Coast, NP   06/20/2024, 3:20 PM     I spent a total of 20 Minutes    in face to face in clinical consultation, greater than 50% of which was counseling/coordinating care for random renal bx.    (A copy of this note was sent to the referring provider and the time of visit.)

## 2024-06-20 NOTE — Progress Notes (Addendum)
 [facility] NEPHROLOGY PROGRESS NOTE  Assessment/ Plan: Pt is a 53 y.o. yo male   # CKD5 -followed by Dr. Melia outpatient. Rapidly progressive CKD, open for biopsy at the end of the month.  UPCR almost 14 g, will follow outpatient record for other serologies.  Given more prolonged hospital course, planning for renal biopsy inpatient rather than outpatient, plan for biopsy 9/10 after asa washout. Req form in front of chart -biopsy will help determine any reversibility and degree of chronicity -renal replacement therapy: not indicated-not exhibiting any overt uremic signs/symptoms nor have issues with volume overload currently. Hoping to avoid starting him on dialysis as an inpatient and just proceed directly to home therapies down the road which I believe he is an excellent candidate for. If needing to start HD while he's here, then would advocate for a TDC only. Understandably, BUN is high but prednisone  can also be a factor for this. Will medically manage his K -will continue to follow -Avoid nephrotoxic medications including NSAIDs and iodinated intravenous contrast exposure unless the latter is absolutely indicated.  Continue strict Input and Output monitoring. Will monitor the patient closely with you and intervene or adjust therapy as indicated by changes in clinical status/labs   Addenum: OP lab result reviewed from [facility]. Serologies including ANCA, ASO, complement level, anti dsDNA ab, Hep B, Hep C, anti-GBM, anti-PLA2R ab negative    #Ankylosing spondylitis - With flare - Per primary, on prednisone    #Hyperkalemia - Renal diet - Treated with Lokelma  as needed.  #Acidosis - Continue sodium bicarbonate , follow labs.   #History of ischemic stroke - Recent admission, was discharged on 8/13.  Status post DAPT, plan per discharge summary was to keep aspirin  on until renal biopsy.  Discussed with primary service, held aspirin  (last dose 06/17/24)   #Hypertension - BP currently  acceptable on amlodipine  and carvedilol    #Anemia of CKD - Transfuse for hemoglobin less than 7   Subjective: Seen and examined.  Denies nausea, vomiting, chest pain, shortness of breath.  No new event.  Urine output is 1.4 L. Objective Vital signs in last 24 hours: Vitals:   06/19/24 1641 06/19/24 2158 06/20/24 0322 06/20/24 0807  BP: 126/77 136/86 126/83 (!) 141/83  Pulse: 70 68 65 63  Resp: 18 18 16 16   Temp: 97.6 F (36.4 C) 97.6 F (36.4 C) 97.6 F (36.4 C) 98.2 F (36.8 C)  TempSrc:  Oral Oral   SpO2: 97% 98% 97% 100%  Weight:      Height:       Weight change:   Intake/Output Summary (Last 24 hours) at 06/20/2024 1306 Last data filed at 06/20/2024 1243 Gross per 24 hour  Intake 1560 ml  Output 2210 ml  Net -650 ml       Labs: RENAL PANEL Recent Labs  Lab 06/15/24 1059 06/16/24 0323 06/17/24 0642 06/18/24 0632 06/19/24 0437 06/20/24 0653  NA 135 136 139 134* 132* 134*  K 5.0 5.0 5.3* 5.2* 5.3* 5.1  CL 106 109 107 104 105 104  CO2 16* 14* 16* 16* 15* 18*  GLUCOSE 141* 140* 182* 203* 223* 192*  BUN 84* 81* 101* 113* 120* 118*  CREATININE 5.38* 5.38* 5.69* 5.59* 5.37* 5.12*  CALCIUM  9.0 8.8* 8.8* 8.4* 8.0* 8.0*  PHOS  --   --   --  6.1* 6.2* 5.6*  ALBUMIN 2.3*  --   --  2.1* 1.9* 2.0*    Liver Function Tests: Recent Labs  Lab 06/15/24 1059  06/18/24 9367 06/19/24 0437 06/20/24 0653  AST 19  --   --   --   ALT 10  --   --   --   ALKPHOS 111  --   --   --   BILITOT 0.6  --   --   --   PROT 7.3  --   --   --   ALBUMIN 2.3* 2.1* 1.9* 2.0*   No results for input(s): LIPASE, AMYLASE in the last 168 hours. No results for input(s): AMMONIA in the last 168 hours. CBC: Recent Labs    03/21/24 1037 05/09/24 1159 05/22/24 0837 06/15/24 1100 06/16/24 0323 06/17/24 0642 06/18/24 0632 06/19/24 0437  HGB 11.3*   < > 13.1 11.2* 10.4* 10.5* 10.2*  --   MCV 93   < > 89.2 91.4 89.9 88.0 89.3  --   FERRITIN 60  --   --   --   --   --   --  120   TIBC 298  --   --   --   --   --   --  209*  IRON 52  --   --   --   --   --   --  107   < > = values in this interval not displayed.    Cardiac Enzymes: Recent Labs  Lab 06/15/24 1600  CKTOTAL 53   CBG: Recent Labs  Lab 06/19/24 1239 06/19/24 1620 06/19/24 2145 06/20/24 0649 06/20/24 1205  GLUCAP 210* 348* 344* 178* 205*    Iron Studies:  Recent Labs    06/19/24 0437  IRON 107  TIBC 209*  FERRITIN 120   Studies/Results: No results found.  Medications: Infusions:   Scheduled Medications:  acetaminophen   650 mg Oral Q8H   amLODipine   10 mg Oral Daily   atorvastatin   40 mg Oral Daily   carvedilol   3.125 mg Oral BID   ezetimibe   10 mg Oral Daily   feeding supplement  237 mL Oral BID AC   heparin   5,000 Units Subcutaneous Q8H   insulin  aspart  0-9 Units Subcutaneous TID WC   lidocaine   2 patch Transdermal Q24H   pantoprazole   40 mg Oral Daily   polyethylene glycol  17 g Oral BID   [START ON 06/21/2024] predniSONE   40 mg Oral Q breakfast   Followed by   NOREEN ON 06/23/2024] predniSONE   30 mg Oral Q breakfast   Followed by   NOREEN ON 06/25/2024] predniSONE   20 mg Oral Q breakfast   Followed by   NOREEN ON 06/27/2024] predniSONE   10 mg Oral Q breakfast   QUEtiapine   25 mg Oral QHS   senna  2 tablet Oral Daily   sodium bicarbonate   1,300 mg Oral BID    have reviewed scheduled and prn medications.  Physical Exam: General:NAD, comfortable Heart:RRR, s1s2 nl Lungs:clear b/l, no crackle Abdomen:soft, Non-tender, non-distended Extremities:No edema Neurology: Alert awake following commands  Alsie Younes Amelie Romney 06/20/2024,1:06 PM  LOS: 4 days

## 2024-06-20 NOTE — Assessment & Plan Note (Signed)
 Symptomatically improved, feels stable on PO pain meds and agreeable to d/c IV pain meds.  Continue to mobilize and plan for acute rehab. - Continue prednisone  taper - Pain regimen: - Acetaminophen  650 mg every 8 hours scheduled - Oxycodone  5-10 mg every 4 hours as needed for moderate/severe pain - D/c Dilaudid  0.5 mg IV every 6 hours - Lidocaine  patches - Tizanidine  4 mg every 8 hours as needed - Need outpatient f/u with Rheumatology  - PT/OT : recommending CRI

## 2024-06-20 NOTE — Assessment & Plan Note (Signed)
 Hx of CKD with mildly hyperkalemia. Received Lokelma  x 2 since admission - Daily BMP

## 2024-06-21 DIAGNOSIS — M450 Ankylosing spondylitis of multiple sites in spine: Secondary | ICD-10-CM | POA: Diagnosis not present

## 2024-06-21 LAB — RENAL FUNCTION PANEL
Albumin: 2 g/dL — ABNORMAL LOW (ref 3.5–5.0)
Anion gap: 10 (ref 5–15)
BUN: 125 mg/dL — ABNORMAL HIGH (ref 6–20)
CO2: 17 mmol/L — ABNORMAL LOW (ref 22–32)
Calcium: 7.7 mg/dL — ABNORMAL LOW (ref 8.9–10.3)
Chloride: 104 mmol/L (ref 98–111)
Creatinine, Ser: 5.17 mg/dL — ABNORMAL HIGH (ref 0.61–1.24)
GFR, Estimated: 13 mL/min — ABNORMAL LOW
Glucose, Bld: 227 mg/dL — ABNORMAL HIGH (ref 70–99)
Phosphorus: 5.3 mg/dL — ABNORMAL HIGH (ref 2.5–4.6)
Potassium: 5.2 mmol/L — ABNORMAL HIGH (ref 3.5–5.1)
Sodium: 131 mmol/L — ABNORMAL LOW (ref 135–145)

## 2024-06-21 LAB — GLUCOSE, CAPILLARY
Glucose-Capillary: 178 mg/dL — ABNORMAL HIGH (ref 70–99)
Glucose-Capillary: 213 mg/dL — ABNORMAL HIGH (ref 70–99)
Glucose-Capillary: 358 mg/dL — ABNORMAL HIGH (ref 70–99)
Glucose-Capillary: 263 mg/dL — ABNORMAL HIGH (ref 70–99)

## 2024-06-21 MED ORDER — SODIUM ZIRCONIUM CYCLOSILICATE 10 G PO PACK
10.0000 g | PACK | Freq: Once | ORAL | Status: AC
  Administered 2024-06-21: 10 g via ORAL
  Filled 2024-06-21: qty 1

## 2024-06-21 MED ORDER — HEPARIN SODIUM (PORCINE) 5000 UNIT/ML IJ SOLN
5000.0000 [IU] | Freq: Three times a day (TID) | INTRAMUSCULAR | Status: DC
  Administered 2024-06-21 (×2): 5000 [IU] via SUBCUTANEOUS
  Filled 2024-06-21 (×2): qty 1

## 2024-06-21 NOTE — Inpatient Diabetes Management (Signed)
 Inpatient Diabetes Program Recommendations  AACE/ADA: New Consensus Statement on Inpatient Glycemic Control (2015)  Target Ranges:  Prepandial:   less than 140 mg/dL      Peak postprandial:   less than 180 mg/dL (1-2 hours)      Critically ill patients:  140 - 180 mg/dL   Lab Results  Component Value Date   GLUCAP 178 (H) 06/21/2024   HGBA1C 7.4 (H) 05/23/2024    Review of Glycemic Control  Latest Reference Range & Units 06/20/24 06:49 06/20/24 12:05 06/20/24 16:29 06/20/24 21:33 06/21/24 08:43  Glucose-Capillary 70 - 99 mg/dL 821 (H) 794 (H) 687 (H) 277 (H) 178 (H)  (H): Data is abnormally high  Diabetes history: Type 2 DM Outpatient Diabetes medications: none Current orders for Inpatient glycemic control: Novolog  0-9 units TID Prednisone  40 mg every day   Inpatient Diabetes Program Recommendations:    Postprandials elevated,    Consider adding Novolog  3 units TID (assuming patient consumes >50% of meals)  Thank you, Wyvonna Pinal, MSN, CDCES Diabetes Coordinator Inpatient Diabetes Program [phone] (team pager from 8a-5p)

## 2024-06-21 NOTE — Assessment & Plan Note (Signed)
 Hx of CKD with mildly hyperkalemia. Received Lokelma  x 2 since admission - Daily BMP

## 2024-06-21 NOTE — Assessment & Plan Note (Signed)
 Last BM 06/20/24, continue on current regimen. -Bowel regimen: MiraLax  17 g BID, senna daily

## 2024-06-21 NOTE — Plan of Care (Signed)

## 2024-06-21 NOTE — Assessment & Plan Note (Signed)
 Symptomatically improved, feels stable on PO pain meds and agreeable to d/c IV pain meds.  Continue to mobilize and plan for acute rehab. - Continue prednisone  taper - Pain regimen: - Acetaminophen  650 mg every 8 hours scheduled - Oxycodone  5-10 mg every 4 hours as needed for moderate/severe pain - D/c Dilaudid  0.5 mg IV every 6 hours - Lidocaine  patches - Tizanidine  4 mg every 8 hours as needed - Need outpatient f/u with Rheumatology  - PT/OT : recommending CRI

## 2024-06-21 NOTE — Assessment & Plan Note (Addendum)
 Close to oliguric UOP, will continue to monitor but appears to be advancing p.o. intake.  Now planning for renal biopsy inpatient, likely this Wednesday (910/25) after ASA washout. - Nephrology consultation, appreciate recommendations             - Planing on Renal biopsy Tomorrow Wednesday 06/22/24                ( 5 day after ASA 81 mg stopped ).             - NPO after MN : pre-procedure             - SQ heparin  held day of procedure. ( Stop SQH after MN ) - Renal diet with 1200 ml fluid restriction  - Strict I's and O's - Daily RFP

## 2024-06-21 NOTE — Progress Notes (Signed)
 Physical Therapy Treatment Patient Details Name: Edward [name] MRN: [id number] DOB: 06/21/1971 Today's Date: 06/21/2024   History of Present Illness Pt is a 53 y.o. male presenting 06/15/24 with R groin pain that radiates to lower back and causing LE weakness and inability to walk.  MRI showed increased size L4-5 disc extrusion and unchanged L5-S1 central disc protrusion.  PMH: DMII, HTN, HLD, ankylosing spondylitis in spine, bipolar 1, CKD V (pending HD), gastric bypass, L thalamic CVA 05/22/24, remote infarcts L basal ganglia and L thalamus    PT Comments  Pt received in chair in room. Continues to have tolerable level of pain since initiation of prednisone . Pt mobilizing well but given that he will not remain on the prednisone , continue to recommend acute rehab >3 hrs/ day before d/c home. Pt trialed rollator today and did very well with it, recommend it for home as will provide him with seat for pain control as well as increased maneuverability on different surfaces. PT will continue to follow.     If plan is discharge home, recommend the following: Assistance with cooking/housework;Assist for transportation;Help with stairs or ramp for entrance;A little help with walking and/or transfers;A little help with bathing/dressing/bathroom   Can travel by private vehicle     Yes  Equipment Recommendations  Rollator (4 wheels)    Recommendations for Other Services OT consult     Precautions / Restrictions Precautions Precautions: Fall Recall of Precautions/Restrictions: Intact Precaution/Restrictions Comments: Fall risk is minimized with use of RW Restrictions Weight Bearing Restrictions Per Provider Order: No     Mobility  Bed Mobility               General bed mobility comments: OOB in chair    Transfers Overall transfer level: Needs assistance Equipment used: Rolling walker (2 wheels), None Transfers: Sit to/from Stand Sit to Stand: Supervision           General  transfer comment: pt ambulating short distances in room without AD, RW for hallway distance    Ambulation/Gait Ambulation/Gait assistance: Contact guard assist Gait Distance (Feet): 150 Feet (2x) Assistive device: Rollator (4 wheels) Gait Pattern/deviations: Step-through pattern, Decreased stance time - right, Drifts right/left Gait velocity: dec Gait velocity interpretation: >2.62 ft/sec, indicative of community ambulatory   General Gait Details: introduced rollator to pt to allow for seated rest breaks as well as greater freedom of mvmts and device that will move better over different terrain. Pt was safe with rollator and able to maintain slightly higher gait speed. He was educated in safe use and function and was able to demo back.   Stairs             Wheelchair Mobility     Tilt Bed    Modified Rankin (Stroke Patients Only) Modified Rankin (Stroke Patients Only) Pre-Morbid Rankin Score: Slight disability Modified Rankin: Severe disability     Balance Overall balance assessment: Needs assistance Sitting-balance support: Single extremity supported, No upper extremity supported, Feet supported Sitting balance-Leahy Scale: Normal     Standing balance support: Bilateral upper extremity supported, During functional activity, Reliant on assistive device for balance Standing balance-Leahy Scale: Fair Standing balance comment: RW for intermittent stability               High Level Balance Comments: initiated balance exercises including SL stance and tandem stance and discussed where he could incorporate working on these with his daily activities            Communication Communication Communication:  No apparent difficulties Factors Affecting Communication: Reduced clarity of speech;Difficulty expressing self (occasional slurred speech; occasional difficulty with word finding)  Cognition Arousal: Alert Behavior During Therapy: WFL for tasks assessed/performed    PT - Cognitive impairments: Memory, History of cognitive impairments                       PT - Cognition Comments: STM deficits since CVA as well as word finding difficulties. Though this is not nearly as pronounced on 2024/07/21 as on eval Following commands: Intact Following commands impaired: Follows multi-step commands with increased time    Cueing Cueing Techniques: Verbal cues  Exercises      General Comments General comments (skin integrity, edema, etc.): VSS. Education on stretching activities with AS and stopping just shy of end range motion for joint protection      Pertinent Vitals/Pain Pain Assessment Pain Assessment: Faces Faces Pain Scale: Hurts a little bit Pain Location: LE's Pain Descriptors / Indicators: Discomfort Pain Intervention(s): Limited activity within patient's tolerance, Monitored during session    Home Living                          Prior Function            PT Goals (current goals can now be found in the care plan section) Acute Rehab PT Goals Patient Stated Goal: Get well, return to work PT Goal Formulation: With patient Time For Goal Achievement: 06/30/24 Potential to Achieve Goals: Fair Progress towards PT goals: Progressing toward goals    Frequency    Min 2X/week      PT Plan      Co-evaluation              AM-PAC PT 6 Clicks Mobility   Outcome Measure  Help needed turning from your back to your side while in a flat bed without using bedrails?: None Help needed moving from lying on your back to sitting on the side of a flat bed without using bedrails?: None Help needed moving to and from a bed to a chair (including a wheelchair)?: A Little Help needed standing up from a chair using your arms (e.g., wheelchair or bedside chair)?: A Little Help needed to walk in hospital room?: A Little Help needed climbing 3-5 steps with a railing? : A Lot 6 Click Score: 19    End of Session   Activity Tolerance:  Patient tolerated treatment well Patient left: with call bell/phone within reach;in chair Nurse Communication: Mobility status PT Visit Diagnosis: Other symptoms and signs involving the nervous system (R29.898);Pain;Muscle weakness (generalized) (M62.81) Pain - Right/Left: Right Pain - part of body: Leg     Time: 8691-8669 PT Time Calculation (min) (ACUTE ONLY): 22 min  Charges:    $Gait Training: 8-22 mins PT General Charges $$ ACUTE PT VISIT: 1 Visit                     Richerd Lipoma, PT  Acute Rehab Services Secure chat preferred Office [phone]    [name] Maliq Pilley 2024/07/21, 2:39 PM

## 2024-06-21 NOTE — Assessment & Plan Note (Signed)
 HTN : Continue home amLODipine  10 mg daily, Carrvedilol 3.125 mg BID Hx of Stroke : Hold ASA 81 mg daily  DMII : SSI HLD : Continue Ezetimibe  10 mg daily Bipolar 1 disorder : Continue QUEtiapine  25 mg QHS Migraines OCD

## 2024-06-21 NOTE — Progress Notes (Addendum)
 Daily Progress Note Intern Pager: [phone]  Patient name: Edward [name] Medical record number: [id number] Date of birth: 1970-11-23 Age: 53 y.o. Gender: male  Primary Care Provider: Donzetta Rollene BRAVO, MD Consultants: Nephrology, Dietitian  Code Status: Full  Pt Overview and Major Events to Date:  9/4 : Admitted for back pain   Pertinent PMH/PSH includes Ankylosing spondylitis Chronic kidney disease, stage 3b Chronic pain Stroke Gastric Bypass (07/2015) Obesity HTN DMII HLD Bipolar 1 disorder Migraines OCD Rheumatic fever at age 63   Assessment & Plan 53 year old male with PMHx ankylosing spondylitis, CKDV, CVA, HTN, T2DM and, HLD, bipolar 1 and migraines presenting with back pain in the setting of AK flare.  Assessment & Plan Ankylosing spondylitis flare Symptomatically improved, feels stable on PO pain meds and agreeable to d/c IV pain meds.  Continue to mobilize and plan for acute rehab. - Continue prednisone  taper - Pain regimen: - Acetaminophen  650 mg every 8 hours scheduled - Oxycodone  5-10 mg every 4 hours as needed for moderate/severe pain - D/c Dilaudid  0.5 mg IV every 6 hours - Lidocaine  patches - Tizanidine  4 mg every 8 hours as needed - Need outpatient f/u with Rheumatology  - PT/OT : recommending CRI  CKD (chronic kidney disease), stage V (HCC) Close to oliguric UOP, will continue to monitor but appears to be advancing p.o. intake.  Now planning for renal biopsy inpatient, likely this Wednesday (910/25) after ASA washout. - Nephrology consultation, appreciate recommendations             - Planing on Renal biopsy Tomorrow Wednesday 06/22/24                ( 5 day after ASA 81 mg stopped ).             - NPO after MN : pre-procedure             - SQ heparin  held day of procedure. ( Stop SQH after MN ) - Renal diet with 1200 ml fluid restriction  - Strict I's and O's - Daily RFP  Hyperkalemia Hx of CKD with mildly hyperkalemia. Received Lokelma  x 2  since admission - Daily BMP Constipation Last BM 06/20/24, continue on current regimen. -Bowel regimen: MiraLax  17 g BID, senna daily Chronic health problem HTN : Continue home amLODipine  10 mg daily, Carrvedilol 3.125 mg BID Hx of Stroke : Hold ASA 81 mg daily  DMII : SSI HLD : Continue Ezetimibe  10 mg daily Bipolar 1 disorder : Continue QUEtiapine  25 mg QHS Migraines OCD  FEN/GI: Renal diet , Protonix  EC 40 mg daily PPx: SQH ( SQ heparin  held day of procedure - hold at [facility] ) Dispo:SNF pending clinical improvement .  Subjective:  [name] is a 53 y.o. male presenting with back pain . He is doing well this morning. Pt states slept well last night and his pain has been well control.   Objective: Temp:  [97.7 F (36.5 C)-97.9 F (36.6 C)] 97.7 F (36.5 C) (09/09 0414) Pulse Rate:  [60-68] 60 (09/09 0414) Resp:  [16-19] 16 (09/09 0414) BP: (131-151)/(80-91) 131/80 (09/09 0414) SpO2:  [98 %-100 %] 98 % (09/09 0414)  Physical Exam Cardiovascular:     Rate and Rhythm: Normal rate.     Pulses: Normal pulses.  Pulmonary:     Effort: Pulmonary effort is normal.     Breath sounds: Normal breath sounds.  Abdominal:     Palpations: Abdomen is soft.  Musculoskeletal:  General: Tenderness (back) present.  Skin:    General: Skin is warm and dry.     Capillary Refill: Capillary refill takes less than 2 seconds.  Neurological:     Mental Status: He is oriented to person, place, and time. Mental status is at baseline.     Sensory: Sensory deficit (residual right sensory changes on R arm and leg,) present.  Psychiatric:        Mood and Affect: Mood normal.        Behavior: Behavior normal.   Laboratory: Most recent CBC Lab Results  Component Value Date   WBC 15.0 (H) 06/18/2024   HGB 10.2 (L) 06/18/2024   HCT 32.5 (L) 06/18/2024   MCV 89.3 06/18/2024   PLT 477 (H) 06/18/2024   Most recent BMP    Latest Ref Rng & Units 06/21/2024    3:51 AM  BMP  Glucose 70 -  99 mg/dL 772   BUN 6 - 20 mg/dL 874   Creatinine 9.38 - 1.24 mg/dL 4.82   Sodium 864 - 854 mmol/L 131   Potassium 3.5 - 5.1 mmol/L 5.2   Chloride 98 - 111 mmol/L 104   CO2 22 - 32 mmol/L 17   Calcium  8.9 - 10.3 mg/dL 7.7     Imaging/Diagnostic Tests: EXAM: MRI SACRUM WITHOUT CONTRAST IMPRESSION: 1. Symmetric marrow edema along the sacroiliac joints with suspected erosions, favoring sacroiliitis related to ankylosing spondylitis. 2. Tendinopathy and potentially mild partial tearing of the right proximal hamstring tendon. Minimal tendinopathy of the left proximal hamstring tendon. 3. Trace edema in the bilateral hip adductor musculature adjacent to the pubis. 4. Subtle endplate edema at O5-4 and L5-S1.  Suzen Elder B, DO 06/21/2024, 9:01 AM  PGY-1, [facility] FPTS Intern pager: [phone], text pages welcome Secure chat group [facility] Teaching Service

## 2024-06-21 NOTE — Progress Notes (Signed)
 Inpatient Rehab Admissions Coordinator:  Saw pt at bedside. Discussed OT updated rehab recommendation to [facility]. Also discussed pt's insurance is a supplemental insurance. Therefore, pt would be self pay if admitted to CIR. Discussed cost of CIR for an uninsured pt. Pt acknowledged understanding. Pt indicated that may desire to go home with [facility]. Pt with pending kidney biopsy. Will continue to follow.   Tinnie Yvone Cohens, MS, CCC-SLP Admissions Coordinator [phone]

## 2024-06-21 NOTE — Progress Notes (Signed)
 Nutrition Follow-up  DOCUMENTATION CODES:   Not applicable  INTERVENTION:  Ensure Plus High Protein po BID (with lunch and dinner), each supplement provides 350 kcal and 20 grams of protein.  Encouraged adequate PO intake to meet calorie and protein needs  Provided Renal diet education handouts and discussed importance of monitoring sodium, potassium, phosphorus, and protein intake  Will continue to monitor results of renal biopsy and treatment progression and adjust dietary recommendations and supplementation based on status.  NUTRITION DIAGNOSIS:   Increased nutrient needs related to acute illness as evidenced by estimated needs. Remains applicable  GOAL:   Patient will meet greater than or equal to 90% of their needs Progressing  MONITOR:   PO intake, Supplement acceptance  REASON FOR ASSESSMENT:   Consult Diet education  ASSESSMENT:   Pt with PMH of ankylosing spondylitis, CKD V, CVA, HTN, DM, HTN, Bipolar 1, recent ischemic stroke d/c'ed 8/13, and migraines who had gastric bypass surgery in 2016 admitted for ankylosing spondylitis flare.  Pt being worked up for possible CIR admission following discharge. Renal biopsy planned for tomorrow 9/10 to have better understanding of cause of declining renal function.  Spoke with pt and pt's mother at bedside. Pt reports good appetite during admission. Diet summary documentation shows average intake of 91% per meal. Pt reports no GI discomforts at this time. Pt reports fluid restriction has been most difficult part of intake while admitted, discussed importance of restriction while monitoring renal function. Encouraged pt to explore more options on menu to increase variability of intake and understand options available on renal diet.   PTA, pt reports recently trying to incorporate more of the Renal diet aspects into his routine. Pt reports never truly getting diet education, provided education and handouts relevant to condition  and discussed phosphorus, potassium, sodium, and protein. Pt reports eating 2-3 x per day PTA and limiting sugar sweetened beverages. Pt reports eating variety of foods but trying to limit carbohydrate intake for diabetes.   Pt has hx of gastric bypass in 2016 where he originally weighed 438 lb and dropped to 309 lb by 1 year post op. Pt now weighs around 270-275#. Pt reports lowest wt after surgery was in 2023 when he weighed 220# while he was also on Ozempic  for diabetes management. Pt has since stopped medication and slowly regained wt. Reports maintaining around 260# but wt rapidly increased around June when he gained 40# of fluid wt during recent admissions. Pt states his goal is to drop wt back down to 220# to help make him better candidate for transplant. Even with significant wt loss over last 10 years, pt has maintained muscle integrity and appears to have adequate muscle stores. Physical exam did not show any signs of vitamin or mineral deficiencies and skin in tact with no signs of breakdown. Will continue to monitor any changes.   Encouraged pt to follow renal diet and follow up with PCP for diabetes management. Pt has not been taking anything for diabetes for some time.  Admission weight/Current weight: 122.5 kg (269 lb ? Reported) Last admission weight: 125.2 kg (275 lb) Per RN non-pitting edema   Per outpatient RD notes:  Highest weight: 438 lb 1 year post bariatric surgery (2017): 309 lb - total weight loss 128 lb   Medications reviewed and include:  SSI Novolog  TID Protonix  Miralax  Senna Sodium bicarbonate  BID Lokelma    Labs reviewed:  CBG x 24 hr: 178-312 mg/dL Potassium 5.2 (high) Sodium 131 (low) BUN 125/Creatinine 5.17 (high)  Phosphorus 5.3 (high)  NUTRITION - FOCUSED PHYSICAL EXAM:  Flowsheet Row Most Recent Value  Orbital Region No depletion  Upper Arm Region Mild depletion  Thoracic and Lumbar Region No depletion  Buccal Region No depletion  Temple Region  Mild depletion  Clavicle Bone Region No depletion  Clavicle and Acromion Bone Region No depletion  Scapular Bone Region No depletion  Dorsal Hand No depletion  Patellar Region No depletion  Anterior Thigh Region No depletion  Posterior Calf Region No depletion  Edema (RD Assessment) None  Hair Reviewed  Eyes Reviewed  Mouth Reviewed  Skin Reviewed  Nails Reviewed    Diet Order:   Diet Order             Diet NPO time specified Except for: Sips with Meds  Diet effective midnight           Diet renal with fluid restriction Fluid restriction: 1200 mL Fluid; Room service appropriate? Yes; Fluid consistency: Thin  Diet effective now                   EDUCATION NEEDS:   Not appropriate for education at this time  Skin:  Skin Assessment: Reviewed RN Assessment  Last BM:  9/8 type 7  Height:   Ht Readings from Last 1 Encounters:  06/15/24 6' (1.829 m)    Weight:   Wt Readings from Last 1 Encounters:  06/15/24 122.5 kg    BMI:  Body mass index is 36.62 kg/m.  Estimated Nutritional Needs:   Kcal:  2100-2300  Protein:  100-115 grams  Fluid:  1L + UOP    Josette Glance, MS, RDN, LDN Clinical Dietitian I Please reach out via secure chat

## 2024-06-21 NOTE — Progress Notes (Signed)
 [facility] NEPHROLOGY PROGRESS NOTE  Assessment/ Plan: Pt is a 53 y.o. yo male   # CKD5 -followed by Dr. Melia outpatient. Rapidly progressive CKD, open for biopsy at the end of the month.  UPCR almost 14 g.OP lab result reviewed from [facility]. Serologies including ANCA, ASO, complement level, anti dsDNA ab, Hep B, Hep C, anti-GBM, anti-PLA2R ab negative   Given more prolonged hospital course, planning for renal biopsy inpatient rather than outpatient, plan for biopsy 9/10 after asa washout. Req form in front of chart. -He has no signs or symptoms of uremia and volume status acceptable.  High BUN is also contributed by steroid.  He is interested in PD and referral to kidney transplant evaluation.  I think he can be discharged home after kidney biopsy and follow-up as outpatient with Dr. Melia.  I had a long discussion with the patient and his mother today.   #Ankylosing spondylitis - With flare - Per primary, on prednisone    #Hyperkalemia - Renal diet - Treating with Lokelma  as needed.  #Acidosis - Continue sodium bicarbonate , follow labs.   #History of ischemic stroke - Recent admission, was discharged on 8/13.  Status post DAPT, plan per discharge summary was to keep aspirin  on until renal biopsy.  Discussed with primary service, held aspirin  (last dose 06/17/24)   #Hypertension - BP currently acceptable on amlodipine  and carvedilol    #Anemia of CKD - Transfuse for hemoglobin less than 7   Subjective: Seen and examined.  He reports feeling well and walked in the hallway.  Denies nausea, vomiting, dysphagia, chest pain or shortness of breath.  His mother was present at the bedside.  Objective Vital signs in last 24 hours: Vitals:   06/20/24 1604 06/20/24 2103 06/21/24 0414 06/21/24 0917  BP: (!) 145/84 (!) 151/91 131/80 128/71  Pulse: 68 66 60 71  Resp: 19 18 16 16   Temp: 97.9 F (36.6 C) 97.7 F (36.5 C) 97.7 F (36.5 C) (!) 97.5 F (36.4 C)  TempSrc:  Oral Oral Oral   SpO2: 100% 98% 98% 98%  Weight:      Height:       Weight change:   Intake/Output Summary (Last 24 hours) at 06/21/2024 1147 Last data filed at 06/21/2024 0624 Gross per 24 hour  Intake 840 ml  Output 1575 ml  Net -735 ml       Labs: RENAL PANEL Recent Labs  Lab 06/15/24 1059 06/16/24 0323 06/17/24 0642 06/18/24 0632 06/19/24 0437 06/20/24 0653 06/21/24 0351  NA 135   < > 139 134* 132* 134* 131*  K 5.0   < > 5.3* 5.2* 5.3* 5.1 5.2*  CL 106   < > 107 104 105 104 104  CO2 16*   < > 16* 16* 15* 18* 17*  GLUCOSE 141*   < > 182* 203* 223* 192* 227*  BUN 84*   < > 101* 113* 120* 118* 125*  CREATININE 5.38*   < > 5.69* 5.59* 5.37* 5.12* 5.17*  CALCIUM  9.0   < > 8.8* 8.4* 8.0* 8.0* 7.7*  PHOS  --   --   --  6.1* 6.2* 5.6* 5.3*  ALBUMIN 2.3*  --   --  2.1* 1.9* 2.0* 2.0*   < > = values in this interval not displayed.    Liver Function Tests: Recent Labs  Lab 06/15/24 1059 06/18/24 9367 06/19/24 0437 06/20/24 0653 06/21/24 0351  AST 19  --   --   --   --  ALT 10  --   --   --   --   ALKPHOS 111  --   --   --   --   BILITOT 0.6  --   --   --   --   PROT 7.3  --   --   --   --   ALBUMIN 2.3*   < > 1.9* 2.0* 2.0*   < > = values in this interval not displayed.   No results for input(s): LIPASE, AMYLASE in the last 168 hours. No results for input(s): AMMONIA in the last 168 hours. CBC: Recent Labs    03/21/24 1037 05/09/24 1159 05/22/24 0837 06/15/24 1100 06/16/24 0323 06/17/24 0642 06/18/24 0632 06/19/24 0437  HGB 11.3*   < > 13.1 11.2* 10.4* 10.5* 10.2*  --   MCV 93   < > 89.2 91.4 89.9 88.0 89.3  --   FERRITIN 60  --   --   --   --   --   --  120  TIBC 298  --   --   --   --   --   --  209*  IRON 52  --   --   --   --   --   --  107   < > = values in this interval not displayed.    Cardiac Enzymes: Recent Labs  Lab 06/15/24 1600  CKTOTAL 53   CBG: Recent Labs  Lab 06/20/24 1205 06/20/24 1629 06/20/24 2133 06/21/24 0843 06/21/24 1104   GLUCAP 205* 312* 277* 178* 213*    Iron Studies:  Recent Labs    06/19/24 0437  IRON 107  TIBC 209*  FERRITIN 120   Studies/Results: No results found.  Medications: Infusions:   Scheduled Medications:  acetaminophen   650 mg Oral Q8H   amLODipine   10 mg Oral Daily   atorvastatin   40 mg Oral Daily   carvedilol   3.125 mg Oral BID   ezetimibe   10 mg Oral Daily   feeding supplement  237 mL Oral BID AC   heparin   5,000 Units Subcutaneous Q8H   insulin  aspart  0-9 Units Subcutaneous TID WC   lidocaine   2 patch Transdermal Q24H   pantoprazole   40 mg Oral Daily   polyethylene glycol  17 g Oral BID   predniSONE   40 mg Oral Q breakfast   Followed by   NOREEN ON 06/23/2024] predniSONE   30 mg Oral Q breakfast   Followed by   NOREEN ON 06/25/2024] predniSONE   20 mg Oral Q breakfast   Followed by   NOREEN ON 06/27/2024] predniSONE   10 mg Oral Q breakfast   QUEtiapine   25 mg Oral QHS   senna  2 tablet Oral Daily   sodium bicarbonate   1,300 mg Oral BID    have reviewed scheduled and prn medications.  Physical Exam: General:NAD, comfortable Heart:RRR, s1s2 nl Lungs:clear b/l, no crackle Abdomen:soft, Non-tender, non-distended Extremities:No edema Neurology: Alert awake following commands  Gabriella Woodhead Amelie Romney 06/21/2024,11:47 AM  LOS: 5 days

## 2024-06-22 ENCOUNTER — Ambulatory Visit: Payer: PRIVATE HEALTH INSURANCE | Admitting: Gastroenterology

## 2024-06-22 ENCOUNTER — Inpatient Hospital Stay (HOSPITAL_COMMUNITY): Payer: PRIVATE HEALTH INSURANCE

## 2024-06-22 ENCOUNTER — Other Ambulatory Visit (HOSPITAL_COMMUNITY): Payer: Self-pay

## 2024-06-22 DIAGNOSIS — M450 Ankylosing spondylitis of multiple sites in spine: Secondary | ICD-10-CM | POA: Diagnosis not present

## 2024-06-22 LAB — RENAL FUNCTION PANEL
Albumin: 2.1 g/dL — ABNORMAL LOW (ref 3.5–5.0)
Anion gap: 13 (ref 5–15)
BUN: 130 mg/dL — ABNORMAL HIGH (ref 6–20)
CO2: 20 mmol/L — ABNORMAL LOW (ref 22–32)
Calcium: 7.8 mg/dL — ABNORMAL LOW (ref 8.9–10.3)
Chloride: 104 mmol/L (ref 98–111)
Creatinine, Ser: 4.89 mg/dL — ABNORMAL HIGH (ref 0.61–1.24)
GFR, Estimated: 13 mL/min — ABNORMAL LOW (ref >60)
Glucose, Bld: 196 mg/dL — ABNORMAL HIGH (ref 70–99)
Phosphorus: 4.8 mg/dL — ABNORMAL HIGH (ref 2.5–4.6)
Potassium: 5 mmol/L (ref 3.5–5.1)
Sodium: 137 mmol/L (ref 135–145)

## 2024-06-22 LAB — CBC
HCT: 28.3 % — ABNORMAL LOW (ref 39.0–52.0)
Hemoglobin: 9.2 g/dL — ABNORMAL LOW (ref 13.0–17.0)
MCH: 28.2 pg (ref 26.0–34.0)
MCHC: 32.5 g/dL (ref 30.0–36.0)
MCV: 86.8 fL (ref 80.0–100.0)
Platelets: 369 K/uL (ref 150–400)
RBC: 3.26 MIL/uL — ABNORMAL LOW (ref 4.22–5.81)
RDW: 15 % (ref 11.5–15.5)
WBC: 13.6 K/uL — ABNORMAL HIGH (ref 4.0–10.5)
nRBC: 0 % (ref 0.0–0.2)

## 2024-06-22 LAB — PROTIME-INR
INR: 1.1 (ref 0.8–1.2)
Prothrombin Time: 14.4 s (ref 11.4–15.2)

## 2024-06-22 LAB — GLUCOSE, CAPILLARY
Glucose-Capillary: 169 mg/dL — ABNORMAL HIGH (ref 70–99)
Glucose-Capillary: 134 mg/dL — ABNORMAL HIGH (ref 70–99)
Glucose-Capillary: 286 mg/dL — ABNORMAL HIGH (ref 70–99)
Glucose-Capillary: 245 mg/dL — ABNORMAL HIGH (ref 70–99)

## 2024-06-22 MED ORDER — FENTANYL CITRATE (PF) 100 MCG/2ML IJ SOLN
INTRAMUSCULAR | Status: AC
  Filled 2024-06-22: qty 2

## 2024-06-22 MED ORDER — PREDNISONE 10 MG PO TABS
ORAL_TABLET | ORAL | 0 refills | Status: AC
  Filled 2024-06-22: qty 12, 6d supply, fill #0

## 2024-06-22 MED ORDER — HEPARIN SODIUM (PORCINE) 5000 UNIT/ML IJ SOLN
5000.0000 [IU] | Freq: Three times a day (TID) | INTRAMUSCULAR | Status: DC
  Administered 2024-06-23: 5000 [IU] via SUBCUTANEOUS
  Filled 2024-06-22: qty 1

## 2024-06-22 MED ORDER — LIDOCAINE 5 % EX PTCH
2.0000 | MEDICATED_PATCH | CUTANEOUS | Status: DC
  Administered 2024-06-22 – 2024-07-02 (×10): 2 via TRANSDERMAL
  Filled 2024-06-22 (×10): qty 2

## 2024-06-22 MED ORDER — HYDROCODONE-ACETAMINOPHEN 5-325 MG PO TABS
1.0000 | ORAL_TABLET | ORAL | Status: DC | PRN
  Administered 2024-06-22: 1 via ORAL
  Administered 2024-06-22: 2 via ORAL
  Administered 2024-06-22 – 2024-06-23 (×2): 1 via ORAL
  Filled 2024-06-22: qty 2
  Filled 2024-06-22: qty 1
  Filled 2024-06-22: qty 2
  Filled 2024-06-22: qty 1

## 2024-06-22 MED ORDER — TAMSULOSIN HCL 0.4 MG PO CAPS
0.4000 mg | ORAL_CAPSULE | Freq: Once | ORAL | Status: AC
  Administered 2024-06-22: 0.4 mg via ORAL
  Filled 2024-06-22: qty 1

## 2024-06-22 MED ORDER — TIZANIDINE HCL 4 MG PO TABS
4.0000 mg | ORAL_TABLET | Freq: Three times a day (TID) | ORAL | 0 refills | Status: DC | PRN
  Filled 2024-06-22: qty 30, 10d supply, fill #0

## 2024-06-22 MED ORDER — MIDAZOLAM HCL 2 MG/2ML IJ SOLN
INTRAMUSCULAR | Status: AC
  Filled 2024-06-22: qty 2

## 2024-06-22 MED ORDER — LIDOCAINE HCL (PF) 1 % IJ SOLN
10.0000 mL | Freq: Once | INTRAMUSCULAR | Status: AC
Start: 2024-06-22 — End: 2024-06-22
  Administered 2024-06-22: 10 mL

## 2024-06-22 MED ORDER — MIDAZOLAM HCL 2 MG/2ML IJ SOLN
INTRAMUSCULAR | Status: AC | PRN
  Administered 2024-06-22 (×2): .5 mg via INTRAVENOUS
  Administered 2024-06-22: 1 mg via INTRAVENOUS

## 2024-06-22 MED ORDER — LIDOCAINE 5 % EX PTCH
2.0000 | MEDICATED_PATCH | CUTANEOUS | 0 refills | Status: DC
  Filled 2024-06-22: qty 30, 15d supply, fill #0

## 2024-06-22 MED ORDER — GLUCERNA SHAKE PO LIQD
237.0000 mL | Freq: Three times a day (TID) | ORAL | 0 refills | Status: AC
  Filled 2024-06-22: qty 21330, 30d supply, fill #0

## 2024-06-22 MED ORDER — SODIUM BICARBONATE 650 MG PO TABS
1300.0000 mg | ORAL_TABLET | Freq: Two times a day (BID) | ORAL | 0 refills | Status: DC
  Filled 2024-06-22: qty 120, 30d supply, fill #0

## 2024-06-22 MED ORDER — SENNA 8.6 MG PO TABS
2.0000 | ORAL_TABLET | Freq: Every day | ORAL | 0 refills | Status: AC
  Filled 2024-06-22: qty 60, 30d supply, fill #0

## 2024-06-22 MED ORDER — DARBEPOETIN ALFA 60 MCG/0.3ML IJ SOSY
60.0000 ug | PREFILLED_SYRINGE | Freq: Once | INTRAMUSCULAR | Status: AC
  Administered 2024-06-22: 60 ug via SUBCUTANEOUS
  Filled 2024-06-22: qty 0.3

## 2024-06-22 MED ORDER — POLYETHYLENE GLYCOL 3350 17 GM/SCOOP PO POWD
17.0000 g | Freq: Two times a day (BID) | ORAL | 0 refills | Status: AC
  Filled 2024-06-22: qty 238, 7d supply, fill #0

## 2024-06-22 MED ORDER — GLUCERNA SHAKE PO LIQD
237.0000 mL | Freq: Three times a day (TID) | ORAL | Status: DC
  Administered 2024-06-22 – 2024-06-26 (×7): 237 mL via ORAL
  Filled 2024-06-22: qty 237

## 2024-06-22 MED ORDER — HYDROXYZINE HCL 10 MG PO TABS
10.0000 mg | ORAL_TABLET | Freq: Three times a day (TID) | ORAL | Status: DC | PRN
  Administered 2024-06-22 (×2): 10 mg via ORAL
  Filled 2024-06-22 (×2): qty 1

## 2024-06-22 MED ORDER — OXYCODONE HCL 5 MG PO TABS
5.0000 mg | ORAL_TABLET | Freq: Four times a day (QID) | ORAL | Status: DC | PRN
Start: 2024-06-22 — End: 2024-07-04

## 2024-06-22 MED ORDER — FENTANYL CITRATE (PF) 100 MCG/2ML IJ SOLN
INTRAMUSCULAR | Status: AC | PRN
  Administered 2024-06-22: 50 ug via INTRAVENOUS

## 2024-06-22 NOTE — Assessment & Plan Note (Signed)
 HTN : Continue home amLODipine  10 mg daily, Carrvedilol 3.125 mg BID Hx of Stroke : Hold ASA 81 mg daily  DMII : SSI HLD : Continue Ezetimibe  10 mg daily Bipolar 1 disorder : Continue QUEtiapine  25 mg QHS Migraines OCD

## 2024-06-22 NOTE — Assessment & Plan Note (Signed)
 Last BM 06/20/24, continue on current regimen. -Bowel regimen: MiraLax  17 g BID, senna daily

## 2024-06-22 NOTE — Assessment & Plan Note (Signed)
 Close to oliguric UOP, will continue to monitor but appears to be advancing p.o. intake.  Now planning for renal biopsy inpatient, likely this Wednesday (910/25) after ASA washout. - Nephrology consultation, appreciate recommendations             - Planing on Renal biopsy today Wednesday 06/22/24 w/ IR               ( 5 day after ASA 81 mg stopped ).             - NPO after MN : pre-procedure             - SQ heparin  held day of procedure. ( Stop SQH after MN ) - Renal diet with 1200 ml fluid restriction  - Strict I's and O's - Daily RFP

## 2024-06-22 NOTE — Procedures (Signed)
  Procedure:  US  core renal biopsy LLP 18g x3 Preprocedure diagnosis: The primary encounter diagnosis was AKI (acute kidney injury) (HCC). A diagnosis of Right inguinal pain was also pertinent to this visit. Postprocedure diagnosis: same EBL:    minimal Complications:   none immediate  See full dictation in [employer].  CHARM Toribio Faes MD Main # [phone] Pager  [phone] Mobile [phone]

## 2024-06-22 NOTE — Discharge Instructions (Addendum)
 Dear Edward [name],  Thank you for letting us  participate in your care. You were hospitalized for pain due to your ankylosing spondylitis and diagnosed with Ankylosing spondylitis, unspecified site of spine (HCC) flare. You were treated with steroids and pain medications.   Your hospital stay was complicated by hematuria (blood in your urine), urinary retention, and a CT demonstrated a hematoma which was embolized by interventional radiology following your renal biopsy.  Your symptoms resolved prior to being discharged.  We restarted your aspirin  for stroke prevention and your hemoglobin was stable.  You also started hemodialysis while you were hospitalized since your kidney function worsened.  Please continue to attend your dialysis sessions on Tuesdays, Thursdays, and Saturdays outpatient at [facility].  Your blood pressure medication amlodipine  was decreased to 5 mg daily per nephrology recommendations.  Please continue to take your carvedilol  as prescribed.  You completed your prednisone  taper here while you were admitted, so you do not need to take this once you are discharged home.  POST-HOSPITAL & CARE INSTRUCTIONS Be sure to take all of your medications as listed in this packet. Go to your follow up appointments (listed below)  DOCTOR'S APPOINTMENT   Future Appointments  Date Time Provider Department Center  07/04/2024  8:50 AM Donzetta Rollene BRAVO, MD [facility]  08/16/2024  1:00 PM [facility]  08/16/2024  2:00 PM [facility]    Follow-up Information     Donzetta Rollene BRAVO, MD Follow up on 06/27/2024.   Specialty: Family Medicine Why: at 9:10 AM for hospital follow up Contact information: [address] [place] [address] [phone]         [facility] Go on 07/05/2024.   Why: Schedule is Tuesday, Thursday, Saturday with 6:30 am start time.  On the first day (9/23), please arrive at 7:30 am to complete paperwork then have  treatment.  After first day, arrive at 6:15 am for 6:30 am chair time. Contact information: [address] [place] [address] [phone]         [facility] [facility] [place] Follow up in 5 week(s).   Specialty: Vascular Surgery Why: Office will call to arrange your appt(s) Contact information: [address], [place]  [address] [phone]                Take care and be well!  Family Medicine Teaching Service Inpatient Team [place]  [facility]  [address] [place], [place] [address] [phone]     Chronic Kidney Disease Stage 3-5 Nutrition Therapy  Choosing healthy food, staying physically active and taking medicines as prescribed by your health care provider may help slow down the progression of kidney disease. There is not one eating plan that is right for everyone with kidney disease. Your registered dietitian nutritionist (RDN) will help you identify what's best for you to eat. Why is nutrition important in kidney disease? Your kidneys help keep nutrients and minerals balanced in your body and remove the waste products from your blood. With kidney disease, your kidneys may not be able to do this job very well. You may need to make some changes to your diet. You may need to control the amount of protein, sodium, potassium, phosphorus or calcium  in your diet. You will also still need to follow diet recommendations for any other conditions you have, [name] heart  disease or diabetes. Fortunately, these diets are similar. Your nutrition care plan might change over time depending on the status of your condition. Your registered dietitian nutritionist  (RDN) or health care provider will tell you if changes are needed based on your blood test results.   Tips How to plan a kidney-friendly meal Fill a 9-inch or 10-inch plate with: Fruits and/or vegetables Breads, cereals,  or grains Protein   A healthy fat Your body needs protein to help build muscle, repair tissue, and fight infection. If you have kidney disease, eating less protein can help protect your kidneys if you are not on dialysis. The most effective way to protect your health is to eat less red meat such as beef or pork and smaller protein portions and to choose plant-proteins as a meat alternative. If you are on dialysis, the protein serving is the size of your palm. If you are not on dialysis, the protein serving is 1/3 to  the size of your palm. Your RDN will discuss how much protein you should eat. Eat at least 6 servings of grain daily and choose whole grains for at least half those servings. Fruits and vegetables are an important part of a healthy diet and help increase your intake of fiber. Eat at least 5 servings of fresh, frozen, or canned fruits and vegetables daily. These foods are a source of potassium but you only need to limit how much you eat if your potassium level is high. Products labeled as "low sodium" may use potassium chloride  in place of sodium. Check the ingredient list to make sure you can safely eat low-sodium foods. You can enjoy ____servings of dairy and dairy alternatives. Your RDN will make a specific recommendation based on your individual needs. Your health care provider will let you know if you need to limit fluid intake. Less fluid will help you manage urine output, and avoid fluid retention which can cause shortness of breath, swelling, high blood pressure, and increased strain on your heart and blood vessels.    Nutrients to Monitor You may need to pay attention to sodium, phosphorus, and potassium in your diet. Your RDN can provide you handouts on potassium and/or phosphorus for more details and strategies to manage these nutrients. Tips to limit sodium: Eat home-cooked meals made from fresh ingredients. Choose foods and condiments with 200 milligrams of sodium or less per  serving. Use frozen or packaged meals with 600 milligrams or less sodium per serving if you are too tired to cook. Check labels to avoid foods that have more than 200 milligrams of sodium per serving. These foods may include canned soups or soup mixes, packaged foods, pickled foods, sauces, and seasonings. Limit how much salt you add to foods or avoid it altogether. Salt-free seasonings [name] herbs, spices, lemon juice, and vinegar will flavor to your food without adding salt. Ask your RDN which frozen and convenience foods, fast foods, or restaurant meals may be ok for you. If you also have diabetes and/or heart disease: It is easy to manage these multiple diets because they are similar in many ways. Eat a variety of healthy foods. Choose whole grain foods. Eat a moderate amount of protein and choose low-fat, lean, and heart-healthy options. Eat at least 5 servings/day of fruits and vegetables. Your blood potassium level will affect which fruits and vegetables you can safely eat. Eat less food with added salt, sugars, and fats.  Your RDN can provide you with additional recommendations if necessary.  Foods to Choose or Limit Your RDN will tell you if you need to limit phosphorus or potassium in your diet and provide you separate handouts about foods to choose or limit. Food Group Choose Limit  Grains Whole grain cereal Oats, oatmeal Whole wheat bread, pita English muffin Corn tortillas Whole wheat pasta Brown rice Quinoa Couscous Grits Popcorn Rice cakes Whole wheat crackers   Grains with more than 200 milligrams of sodium per serving Boxed biscuit, cake, pancake/waffle mixes and other convenience foods Snacks and sweets should be eaten in moderation    Protein Foods Eggs or egg whites Lean beef, wild game, and "all natural" chicken, fish, pork, seafood, or [place] Legumes/Pulses: Beans (such as black, kidney, or white beans), lentils, split peas, black-eyed peas Soy: Tofu,  edamame Nuts and nut butters Protein with more than 200 mg sodium per serving Processed or frozen protein foods   Salty processed meats (such as bacon, bologna, salami and other lunch meats),  ham, hot dogs,  sausage, breakfast sausage, and pre-seasoned meats    Dairy and Dairy Alternatives Lower-phosphorus milk alternatives include unfortified almond, rice, soy or other plant beverages Lower-phosphorus cheeses include brie, goat, cream cheese, mozzarella, parmesan, or ricotta cheese Processed cheeses, such as American cheese, cheese spreads, boxed macaroni and cheese Milk-based or cheese-based soups or sauces Nondairy creamers  Vegetables Fresh, frozen, or no-salt added canned vegetables Processed vegetables or vegetable juice with more than 200 milligrams sodium per serving. Pickled foods, such as olives, sauerkraut, pickles, kimchi Vegetables with added sauces  Fruit Fresh, frozen, or canned fruit Canned fruit in syrup or with added sugars  Fats and Oils Healthy fats such as olive oil, vegetable oil or lower sodium salad dressings Butter, margarine, mayonnaise, and sour cream in moderation Dressing, condiments and other sauces with more than 200 mg of sodium per serving  Beverages/ Fluids   (Fluids include anything that is liquid at room temperature. You may need to limit how much you drink if you are producing less urine.)   Water  Coffee Tea Lemonade Seltzer Processed beverages (such as most colas, sports drinks, energy drinks, some flavored waters, drink mixes., some bottled teas and others) Canned soups with more than 200 mg sodium per serving Beer and wine    Other Herbs, spices, lemon juice, vinegars to flavor food instead of salt Stocks or broths labeled as "no salt added" Condiments and sauces with less than 200 mg sodium per serving Salt, and salt substitutes Bouillon and broths with more than 200 milligrams per serving Broths and soups labeled as "low sodium" Condiments and  sauces with more than 200 mg sodium per serving   Chronic Kidney Disease Stage 3-5 (Not on Dialysis) Sample 1-Day Menu View Nutrient Info Breakfast 1 egg, hard-boiled 1 cup oatmeal 1 cup blueberries 1 cup coffee  Lunch Sandwich made with: 2 slices whole wheat bread 1 ounce mal[place], sliced 2 leaves lettuce 1 teaspoon mustard 1 tablespoon mayonnaise 1 cup carrots, raw 1 apple 1 cup water  with lemon  Afternoon Snack 2 tablespoons hummus 4 celery sticks  Evening Meal 2 ounces fish, broiled 1 cup brown rice, cooked  cup green peppers, sauted  cup mushrooms, sauted 2 tablespoons olive oil  cup green beans  cup peaches  Evening Snack 3 cups popcorn, air-popped 1 pear  Daily Sum Nutrient Unit Value  Macronutrients  Energy kcal 1611  Energy kJ 6744  Protein g 57  Total lipid (fat) g 59  Carbohydrate, by difference g 226  Fiber, total  dietary g 38  Sugars, total g 70  Minerals  Calcium , Ca mg 354  Iron, Fe mg 10  Sodium, Na mg 835  Vitamins  Vitamin C, total ascorbic acid mg 77  Vitamin A, IU IU 22712  Vitamin D  IU 74  Lipids  Fatty acids, total saturated g 10  Fatty acids, total monounsaturated g 29  Fatty acids, total polyunsaturated g 16  Cholesterol mg 239     Chronic Kidney Disease Stage 3-5 Vegan Sample 1-Day Menu View Nutrient Info Breakfast 1 cup cooked oatmeal 1 slice whole wheat toast 2 teaspoons margarine, soft, tub  cup blueberries 1 cup soy milk 1 cup coffee  Lunch 1 whole wheat pita bread  cup hummus 1 large apple  cup sliced cucumber  Evening Meal Stir-fry made with:  cup tofu  cup mung beans 1 cup rice  cup cabbage  cup carrots 1 tablespoon peanut oil  cup sliced peaches 1 cup iced tea  Evening Snack 1 cup soy yogurt 1 cup air-popped popcorn 1 tablespoon olive oil  Daily Sum Nutrient Unit Value  Macronutrients  Energy kcal 1730  Energy kJ 7234  Protein g 58  Total lipid (fat) g 63  Carbohydrate, by difference g 249   Fiber, total dietary g 30  Sugars, total g 77  Minerals  Calcium , Ca mg 900  Iron, Fe mg 13  Sodium, Na mg 1052  Vitamins  Vitamin C, total ascorbic acid mg 75  Vitamin A, IU IU 10060  Vitamin D  IU 120  Lipids  Fatty acids, total saturated g 11  Fatty acids, total monounsaturated g 26  Fatty acids, total polyunsaturated g 18  Cholesterol mg 0     Chronic Kidney Disease Stage 3-5 Vegetarian (Lacto-Ovo) Sample 1-Day Menu View Nutrient Info Breakfast 1 cup cooked oatmeal 1 slice whole wheat toast 2 teaspoons margarine, soft, tub 1 scrambled egg  cup blueberries  cup soy milk 1 cup coffee  Lunch 2 slices whole wheat bread 2 tablespoons peanut butter 1 cup lettuce  cup sliced cucumber 1 tablespoon oil and vinegar salad dressing 1 large apple  Evening Meal Stir-fry made with:  cup tofu 1 cup rice  cup canned bamboo shoots  cup carrots 1 tablespoon peanut oil  cup sliced peaches 1 cup iced tea  Evening Snack 1 cup air-popped popcorn 1 tablespoon olive oil  Daily Sum Nutrient Unit Value  Macronutrients  Energy kcal 1693  Energy kJ 7076  Protein g 54  Total lipid (fat) g 81  Carbohydrate, by difference g 201  Fiber, total dietary g 27  Sugars, total g 61  Minerals  Calcium , Ca mg 585  Iron, Fe mg 14  Sodium, Na mg 742  Vitamins  Vitamin C, total ascorbic acid mg 32  Vitamin A, IU IU 12180  Vitamin D  IU 44  Lipids  Fatty acids, total saturated g 16  Fatty acids, total monounsaturated g 36  Fatty acids, total polyunsaturated g 24  Cholesterol mg 169     Chronic Kidney Disease Stage 5 (on Dialysis) Sample 1-Day Menu View Nutrient Info Breakfast 1 egg, hard-boiled 1 cup oatmeal  cup blueberries  cup soy yogurt 1 cup coffee  Lunch Sandwich made with: 2 slices whole wheat bread 2 ounces mal[place], sliced 2 leaves lettuce 1 teaspoon mustard 1 tablespoon mayonnaise 1 cup carrots, raw 1 apple 1 cup water  with lemon  Afternoon Snack 2 tablespoons  peanut butter 4 celery sticks  Evening Meal 3 ounces fish, broiled  cup brown rice, cooked  cup green peppers, sauted  cup mushrooms, sauted 1 tablespoon olive oil  cup green beans  cup peaches  Evening Snack 3 cups popcorn, air-popped topped with: 1 teaspoon parmesan cheese 1 pear  Daily Sum Nutrient Unit Value  Macronutrients  Energy kcal 1713  Energy kJ 7170  Protein g 79  Total lipid (fat) g 64  Carbohydrate, by difference g 220  Fiber, total dietary g 36  Sugars, total g 84  Minerals  Calcium , Ca mg 664  Iron, Fe mg 11  Sodium, Na mg 835  Vitamins  Vitamin C, total ascorbic acid mg 100  Vitamin A, IU IU 22694  Vitamin D  IU 91  Lipids  Fatty acids, total saturated g 12  Fatty acids, total monounsaturated g 27  Fatty acids, total polyunsaturated g 18  Cholesterol mg 278    Copyright 2020  Academy of [employer] Dietetics. All rights reserved        [facility]  Discharge Instructions  AV Fistula or Graft Surgery for Dialysis Access  Please refer to the following instructions for your post-procedure care. Your surgeon or physician assistant will discuss any changes with you.  Activity  You may drive the day following your surgery, if you are comfortable and no longer taking prescription pain medication. Resume full activity as the soreness in your incision resolves.  Bathing/Showering  You may shower after you go home. Keep your incision dry for 48 hours. Do not soak in a bathtub, hot tub, or swim until the incision heals completely. You may not shower if you have a hemodialysis catheter.  Incision Care  Clean your incision with mild soap and water  after 48 hours. Pat the area dry with a clean towel. You do not need a bandage unless otherwise instructed. Do not apply any ointments or creams to your incision. You may have skin glue on your incision. Do not peel it off. It will come off on its own in about one week.  Your arm may swell a bit after surgery. To reduce swelling use pillows to elevate your arm so it is above your heart. Your doctor will tell you if you need to lightly wrap your arm with an ACE bandage.  Diet  Resume your normal diet. There are not special food restrictions following this procedure. In order to heal from your surgery, it is CRITICAL to get adequate nutrition. Your body requires vitamins, minerals, and protein. Vegetables are the best source of vitamins and minerals. Vegetables also provide the perfect balance of protein. Processed food has little nutritional value, so try to avoid this.  Medications  Resume taking all of your medications. If your incision is causing pain, you may take over-the counter pain relievers such as acetaminophen  (Tylenol ). If you were prescribed a stronger pain medication, please be aware these medications can cause nausea and constipation. Prevent nausea by taking the medication with a snack or meal. Avoid constipation by drinking plenty of fluids and eating foods with high amount of fiber, such as fruits, vegetables, and grains.  Do not take Tylenol  if you are taking prescription pain medications.  Follow up Your surgeon may want to see you in the office following your access surgery. If so, this will be arranged at the time of your surgery.  Please call us  immediately for any of the following conditions:  Increased pain, redness, drainage (pus) from your incision site Fever of 101 degrees or higher Severe or  worsening pain at your incision site Hand pain or numbness.  Reduce your risk of vascular disease:  Stop smoking. If you would [name] help, call [facility] at 1-800-QUIT-NOW (331-797-7117) or [place] at 901-157-5275  Manage your cholesterol Maintain a desired weight Control your diabetes Keep your blood pressure down  Dialysis  It will take several weeks to several months for your new dialysis access to be ready for use. Your  surgeon will determine when it is okay to use it. Your nephrologist will continue to direct your dialysis. You can continue to use your Permcath until your new access is ready for use.   07/02/2024 Dragan Tamburrino [id number] 05-20-1971  Surgeon(s): Gretta Lonni PARAS, MD  Procedure(s): LEFT BRACHIOCEPHALIC ARTERIOVENOUS (AV) FISTULA CREATION   May stick graft immediately   May stick graft on designated area only:   x Do not stick fistula for 12 weeks    If you have any questions, please call the office at [phone].

## 2024-06-22 NOTE — Plan of Care (Signed)

## 2024-06-22 NOTE — Progress Notes (Signed)
 IP rehab admissions - Noted patient would like to DC directly home and not admit to CIR once he is medically ready for discharge.  Is doing well with therapies walking 150' with [facility].  [phone]

## 2024-06-22 NOTE — Progress Notes (Signed)
 [facility] NEPHROLOGY PROGRESS NOTE  Assessment/ Plan: Pt is a 53 y.o. yo male   # CKD5 -followed by Dr. Melia outpatient. Rapidly progressive CKD, open for biopsy at the end of the month.  UPCR almost 14 g.OP lab result reviewed from [facility]. Serologies including ANCA, ASO, complement level, anti dsDNA ab, Hep B, Hep C, anti-GBM, anti-PLA2R ab negative   -He has no signs or symptoms of uremia and volume status acceptable.  High BUN is also contributed by steroid.  He is interested in PD and referral to kidney transplant evaluation.   -s/p kidney biopsy today, some soreness around the bx site. Advised to take bed rest  and inform us  if pain is worsen or persistent hematuria or with any new symptom. Will follow bx result. -I think he can be dc home tomorrow and follow-up with Dr. Melia.    #Ankylosing spondylitis - With flare - Per primary, on prednisone  with dose taper.   #Hyperkalemia - Renal diet - Treating with Lokelma  as needed.  #Acidosis - Continue sodium bicarbonate , follow labs.   #History of ischemic stroke - Recent admission, was discharged on 8/13.  Status post DAPT, plan per discharge summary was to keep aspirin  on until renal biopsy.  Discussed with primary service, held aspirin  (last dose 06/17/24)   #Hypertension - BP currently acceptable on amlodipine  and carvedilol    #Anemia of CKD - Iron sat 51 %, order a dose of aranesp , follow labs.   D/w pt and his mother at bedside.    Subjective: Seen and examined.  He came kidney biopsy, some soreness at the biopsy site, reports red urine, no cp or sob.   Objective Vital signs in last 24 hours: Vitals:   06/22/24 0920 06/22/24 0925 06/22/24 0930 06/22/24 0945  BP: (!) 143/83 136/83 138/81 138/81  Pulse: 65 66 65 69  Resp: 12 12 12 17   Temp:      TempSrc:      SpO2: 100% 100% 97% 98%  Weight:      Height:       Weight change:   Intake/Output Summary (Last 24 hours) at 06/22/2024 1124 Last data filed at  06/21/2024 2213 Gross per 24 hour  Intake 240 ml  Output 425 ml  Net -185 ml       Labs: RENAL PANEL Recent Labs  Lab 06/18/24 0632 06/19/24 0437 06/20/24 0653 06/21/24 0351 06/22/24 0510  NA 134* 132* 134* 131* 137  K 5.2* 5.3* 5.1 5.2* 5.0  CL 104 105 104 104 104  CO2 16* 15* 18* 17* 20*  GLUCOSE 203* 223* 192* 227* 196*  BUN 113* 120* 118* 125* 130*  CREATININE 5.59* 5.37* 5.12* 5.17* 4.89*  CALCIUM  8.4* 8.0* 8.0* 7.7* 7.8*  PHOS 6.1* 6.2* 5.6* 5.3* 4.8*  ALBUMIN 2.1* 1.9* 2.0* 2.0* 2.1*    Liver Function Tests: Recent Labs  Lab 06/20/24 0653 06/21/24 0351 06/22/24 0510  ALBUMIN 2.0* 2.0* 2.1*   No results for input(s): LIPASE, AMYLASE in the last 168 hours. No results for input(s): AMMONIA in the last 168 hours. CBC: Recent Labs    03/21/24 1037 05/09/24 1159 06/15/24 1100 06/16/24 0323 06/17/24 9357 06/18/24 9367 06/19/24 0437 06/22/24 0510  HGB 11.3*   < > 11.2* 10.4* 10.5* 10.2*  --  9.2*  MCV 93   < > 91.4 89.9 88.0 89.3  --  86.8  FERRITIN 60  --   --   --   --   --  120  --  TIBC 298  --   --   --   --   --  209*  --   IRON 52  --   --   --   --   --  107  --    < > = values in this interval not displayed.    Cardiac Enzymes: Recent Labs  Lab 06/15/24 1600  CKTOTAL 53   CBG: Recent Labs  Lab 06/21/24 1104 06/21/24 1631 06/21/24 2202 06/22/24 0541 06/22/24 1115  GLUCAP 213* 358* 263* 169* 134*    Iron Studies:  No results for input(s): IRON, TIBC, TRANSFERRIN, FERRITIN in the last 72 hours.  Studies/Results: No results found.  Medications: Infusions:   Scheduled Medications:  acetaminophen   650 mg Oral Q8H   amLODipine   10 mg Oral Daily   atorvastatin   40 mg Oral Daily   carvedilol   3.125 mg Oral BID   ezetimibe   10 mg Oral Daily   feeding supplement (GLUCERNA SHAKE)  237 mL Oral TID BM   [START ON 06/23/2024] heparin   5,000 Units Subcutaneous Q8H   insulin  aspart  0-9 Units Subcutaneous TID WC    lidocaine   2 patch Transdermal Q24H   pantoprazole   40 mg Oral Daily   polyethylene glycol  17 g Oral BID   [START ON 06/23/2024] predniSONE   30 mg Oral Q breakfast   Followed by   NOREEN ON 06/25/2024] predniSONE   20 mg Oral Q breakfast   Followed by   NOREEN ON 06/27/2024] predniSONE   10 mg Oral Q breakfast   QUEtiapine   25 mg Oral QHS   senna  2 tablet Oral Daily   sodium bicarbonate   1,300 mg Oral BID    have reviewed scheduled and prn medications.  Physical Exam: General:NAD, comfortable Heart:RRR, s1s2 nl Lungs:clear b/l, no crackle Abdomen:soft, Non-tender, distended Extremities:No edema Neurology: Alert awake following commands  Edward [name] 06/22/2024,11:24 AM  LOS: 6 days

## 2024-06-22 NOTE — Assessment & Plan Note (Signed)
 Symptomatically improved, feels stable on PO pain meds and agreeable to d/c IV pain meds.  Continue to mobilize and plan for acute rehab. - Continue prednisone  taper - Pain regimen: - Acetaminophen  650 mg every 8 hours scheduled - Oxycodone  5-10 mg every 4 hours as needed for moderate/severe pain - D/c Dilaudid  0.5 mg IV every 6 hours - Lidocaine  patches - Tizanidine  4 mg every 8 hours as needed - Need outpatient f/u with Rheumatology  - PT/OT : recommending CRI

## 2024-06-22 NOTE — Assessment & Plan Note (Signed)
 Hx of CKD with mildly hyperkalemia. Received Lokelma  x 2 since admission - Daily BMP

## 2024-06-22 NOTE — Discharge Summary (Shared)
 Family Medicine Teaching [facility] Discharge Summary  Patient name: Edward [name] Medical record number: [id number] Date of birth: June 15, 1971 Age: 53 y.o. Gender: male Date of Admission: 06/15/2024  Date of Discharge: 06/22/24 Admitting Physician: [name], MD  Primary Care Provider: Donzetta Rollene BRAVO, MD Consultants: Nephrology, Dietitian  Indication for Hospitalization: Back pain  Discharge Diagnoses/Problem List:  Principal Problem for Admission:  Other Problems addressed during stay:  Principal Problem:   Back pain Active Problems:   Chronic health problem   Chronic pain   Ankylosing spondylitis flare   CKD (chronic kidney disease), stage V (HCC)   Hyperkalemia   Constipation   The above problem list has been updated and reviewed for accuracy, including the initial reason for admission.   Brief Hospital Course:  Edward [name] is a 53 y.o.male with a history of Ankylosing Spondylitis, CKD5, HTN, T2DM who was admitted to the [facility] Medicine Teaching Service at [facility] for Ankylosing spondylitis flare. His hospital course is detailed below:  Ankylosing spondylitis  Presented with severe pain down R leg limiting ability to stand. MRI sacrum with sacroiliitis.  Patient received Tylenol , oxycodone , dilaudid , tizanidine , lidocaine  patches with improvement. Patient started on Prednisone  taper which will be complete on 06/28/24    CKD5 Rapidly progressive CKD. Nephrology consulted who recommended renal biopsy. Following ASA washout, renal biopsy completed today with pending pathology result. Patient will start back on ASA 81 mg on 06/23/24 a day after Renal Biopsy  Other chronic conditions were medically managed with home medications and formulary alternatives as necessary (HTN, CVA, T2DM, HLD, BP1D)  PCP Follow-up Recommendations: Ensure outpatient Rheumatology FU F/u w/ path result from renal biopsy Continue pain management for Ankylosing spondylitis        Results/Tests Pending at Time of Discharge:  Unresulted Labs (From admission, onward)     Start     Ordered   06/23/24 0500  CBC  Tomorrow morning,   R       Question:  Specimen collection method  Answer:  Lab=Lab collect   06/22/24 1133   06/21/24 0500  Renal function panel  Daily,   R     Question:  Specimen collection method  Answer:  Lab=Lab collect   06/20/24 1315            Disposition: Home  Discharge Condition: Good  Discharge Exam:  Vitals:   06/22/24 0930 06/22/24 0945  BP: 138/81 138/81  Pulse: 65 69  Resp: 12 17  Temp:    SpO2: 97% 98%    Significant Procedures: Renal Biopsy  Significant Labs and Imaging:  Recent Labs  Lab 06/22/24 0510  WBC 13.6*  HGB 9.2*  HCT 28.3*  PLT 369   Recent Labs  Lab 06/21/24 0351 06/22/24 0510  NA 131* 137  K 5.2* 5.0  CL 104 104  CO2 17* 20*  GLUCOSE 227* 196*  BUN 125* 130*  CREATININE 5.17* 4.89*  CALCIUM  7.7* 7.8*  PHOS 5.3* 4.8*  ALBUMIN 2.0* 2.1*    Pertinent Imaging    EXAM:  MRI LUMBAR SPINE  06/15/2024 06:24:00 PM   IMPRESSION: 1. Symmetric marrow edema along the sacroiliac joints with suspected erosions, favoring sacroiliitis related to ankylosing spondylitis. 2. Tendinopathy and potentially mild partial tearing of the right proximal hamstring tendon. Minimal tendinopathy of the left proximal hamstring tendon. 3. Trace edema in the bilateral hip adductor musculature adjacent to the pubis. 4. Subtle endplate edema at O5-4 and L5-S1.     Electronically Signed  By: Ryan Salvage M.D.   On: 06/16/2024 08:11  Discharge Medications:  Allergies as of 06/22/2024       Reactions   Demerol [meperidine] Swelling   Swelling of hands, neck   Nsaids Other (See Comments)   Gi upset; cannot take due to kidney disease.   Tapentadol Other (See Comments)   Skin crawling;cannot take due to Kidney Disease    Tramadol  Other (See Comments)   Cannot take due to kidney disease.          Medication List     TAKE these medications    Acetaminophen  8 Hour 650 MG CR tablet Generic drug: acetaminophen  Take 1,300 mg by mouth 2 (two) times daily.   amLODipine  10 MG tablet Commonly known as: NORVASC  Take 1 tablet (10 mg total) by mouth daily.   aspirin  EC 81 MG tablet Take 1 tablet (81 mg total) by mouth daily. Swallow whole.   atorvastatin  40 MG tablet Commonly known as: LIPITOR Take 1 tablet (40 mg total) by mouth daily.   carvedilol  3.125 MG tablet Commonly known as: COREG  Take 3.125 mg by mouth 2 (two) times daily.   diclofenac Sodium 1 % Gel Commonly known as: VOLTAREN Apply 4 g topically in the morning and at bedtime. Apply topically to neck and lumbar spine twice per day.   ezetimibe  10 MG tablet Commonly known as: ZETIA  Take 1 tablet (10 mg total) by mouth daily.   feeding supplement (GLUCERNA SHAKE) Liqd Take 237 mLs by mouth 3 (three) times daily between meals.   ferrous sulfate  325 (65 FE) MG tablet Take 1 tablet (325 mg total) by mouth daily with breakfast.   lidocaine  5 % Commonly known as: LIDODERM  Place 2 patches onto the skin daily. Remove & Discard patch within 12 hours or as directed by MD Start taking on: June 23, 2024   oxyCODONE  5 MG immediate release tablet Commonly known as: Oxy IR/ROXICODONE  Take 1 tablet (5 mg total) by mouth every 6 (six) hours as needed for up to 14 days for severe pain (pain score 7-10) or moderate pain (pain score 4-6).   pantoprazole  40 MG tablet Commonly known as: PROTONIX  Take 1 tablet (40 mg total) by mouth daily.   polyethylene glycol 17 g packet Commonly known as: MIRALAX  / GLYCOLAX  Take 17 g by mouth 2 (two) times daily.   predniSONE  10 MG tablet Commonly known as: DELTASONE  Take 3 tablets (30 mg total) by mouth daily with breakfast for 2 days, THEN 2 tablets (20 mg total) daily with breakfast for 2 days, THEN 1 tablet (10 mg total) daily with breakfast for 2 days. Start taking on:  June 23, 2024   QUEtiapine  25 MG tablet Commonly known as: SEROquel  Take 1 tablet 25mg  at night for three days, if not oversedated can increase to 2 tablets 50mg  total at night. What changed:  how much to take additional instructions   senna 8.6 MG Tabs tablet Commonly known as: SENOKOT Take 2 tablets (17.2 mg total) by mouth daily. Start taking on: June 23, 2024   sodium bicarbonate  650 MG tablet Take 2 tablets (1,300 mg total) by mouth 2 (two) times daily.   tiZANidine  4 MG tablet Commonly known as: ZANAFLEX  Take 1 tablet (4 mg total) by mouth every 8 (eight) hours as needed for muscle spasms.        Discharge Instructions: Please refer to Patient Instructions section of EMR for full details.  Patient was counseled important signs and symptoms that  should prompt return to medical care, changes in medications, dietary instructions, activity restrictions, and follow up appointments.   Follow-Up Appointments:  Follow-up Information     Pray, Rollene BRAVO, MD Follow up on 06/27/2024.   Specialty: Family Medicine Why: at 9:10 AM for hospital follow up Contact information: [address] [place] [address] [phone]                 Suzen Houston NOVAK, DO 06/22/2024, 12:17 PM PGY-1, [facility] Family Medicine

## 2024-06-22 NOTE — Progress Notes (Signed)
 Daily Progress Note Intern Pager: [phone]  Patient name: Edward [name] Medical record number: [id number] Date of birth: September 06, 1971 Age: 53 y.o. Gender: male  Primary Care Provider: Donzetta Rollene BRAVO, MD Consultants: Nephrology, Dietitian  Code Status: Full  Pt Overview and Major Events to Date:  9/4 : Admitted for back pain   Pertinent PMH/PSH includes Ankylosing spondylitis Chronic kidney disease, stage 3b Chronic pain Stroke Gastric Bypass (07/2015) Obesity HTN DMII HLD Bipolar 1 disorder Migraines OCD Rheumatic fever at age 44   Assessment & Plan 53 year old male with PMHx ankylosing spondylitis, CKDV, CVA, HTN, T2DM and, HLD, bipolar 1 and migraines presenting with back pain in the setting of AK flare.  Assessment & Plan Ankylosing spondylitis flare Symptomatically improved, feels stable on PO pain meds and agreeable to d/c IV pain meds.  Continue to mobilize and plan for acute rehab. - Continue prednisone  taper - Pain regimen: - Acetaminophen  650 mg every 8 hours scheduled - Oxycodone  5-10 mg every 4 hours as needed for moderate/severe pain - D/c Dilaudid  0.5 mg IV every 6 hours - Lidocaine  patches - Tizanidine  4 mg every 8 hours as needed - Need outpatient f/u with Rheumatology  - PT/OT : recommending CRI  CKD (chronic kidney disease), stage V (HCC) Close to oliguric UOP, will continue to monitor but appears to be advancing p.o. intake.  Now planning for renal biopsy inpatient, likely this Wednesday (910/25) after ASA washout. - Nephrology consultation, appreciate recommendations             - Planing on Renal biopsy today Wednesday 06/22/24 w/ IR               ( 5 day after ASA 81 mg stopped ).             - NPO after MN : pre-procedure             - SQ heparin  held day of procedure. ( Stop SQH after MN ) - Renal diet with 1200 ml fluid restriction  - Strict I's and O's - Daily RFP  Hyperkalemia Hx of CKD with mildly hyperkalemia. Received Lokelma  x 2  since admission - Daily BMP Constipation Last BM 06/20/24, continue on current regimen. -Bowel regimen: MiraLax  17 g BID, senna daily Chronic health problem HTN : Continue home amLODipine  10 mg daily, Carrvedilol 3.125 mg BID Hx of Stroke : Hold ASA 81 mg daily  DMII : SSI HLD : Continue Ezetimibe  10 mg daily Bipolar 1 disorder : Continue QUEtiapine  25 mg QHS Migraines OCD  FEN/GI: NPO - pre-procedure, Protonix  EC 40 mg daily PPx: SQH hold since MN  Dispo:SNF pending clinical improvement .  Subjective:  [name] is a 53 y.o. male presenting with back pain. NAEON Pt states doing well w/ well control pain. Last BM yesterday. Per pt he has been having Ensure w/ each meal which may contribute to his elevated BG in addition to Prednisolone. NPO after MN w/ last SQH was yesterday.   Objective: Temp:  [97.5 F (36.4 C)-97.8 F (36.6 C)] 97.7 F (36.5 C) (09/09 2025) Pulse Rate:  [70-78] 70 (09/09 2025) Resp:  [16-18] 18 (09/09 2025) BP: (121-131)/(69-81) 131/81 (09/09 2025) SpO2:  [94 %-98 %] 98 % (09/09 2025)  Physical Exam Cardiovascular:     Rate and Rhythm: Normal rate.     Pulses: Normal pulses.  Pulmonary:     Effort: Pulmonary effort is normal.     Breath sounds: Normal breath sounds.  Abdominal:     Palpations: Abdomen is soft.  Musculoskeletal:        General: Tenderness (back) present.  Skin:    General: Skin is warm and dry.     Capillary Refill: Capillary refill takes less than 2 seconds.  Neurological:     Mental Status: He is oriented to person, place, and time. Mental status is at baseline.     Sensory: Sensory deficit (residual right sensory changes on R arm and leg,) present.  Psychiatric:        Mood and Affect: Mood normal.        Behavior: Behavior normal.   Laboratory: Most recent CBC Lab Results  Component Value Date   WBC 13.6 (H) 06/22/2024   HGB 9.2 (L) 06/22/2024   HCT 28.3 (L) 06/22/2024   MCV 86.8 06/22/2024   PLT 369 06/22/2024    Most recent BMP    Latest Ref Rng & Units 06/22/2024    5:10 AM  BMP  Glucose 70 - 99 mg/dL 803   BUN 6 - 20 mg/dL 869   Creatinine 9.38 - 1.24 mg/dL 5.10   Sodium 864 - 854 mmol/L 137   Potassium 3.5 - 5.1 mmol/L 5.0   Chloride 98 - 111 mmol/L 104   CO2 22 - 32 mmol/L 20   Calcium  8.9 - 10.3 mg/dL 7.8     Imaging/Diagnostic Tests: EXAM: MRI SACRUM WITHOUT CONTRAST IMPRESSION: 1. Symmetric marrow edema along the sacroiliac joints with suspected erosions, favoring sacroiliitis related to ankylosing spondylitis. 2. Tendinopathy and potentially mild partial tearing of the right proximal hamstring tendon. Minimal tendinopathy of the left proximal hamstring tendon. 3. Trace edema in the bilateral hip adductor musculature adjacent to the pubis. 4. Subtle endplate edema at O5-4 and L5-S1.  Suzen Elder B, DO 06/22/2024, 9:04 AM  PGY-1, [facility] Family Medicine FPTS Intern pager: [phone], text pages welcome Secure chat group [facility] Teaching Service

## 2024-06-23 ENCOUNTER — Inpatient Hospital Stay (HOSPITAL_COMMUNITY): Payer: PRIVATE HEALTH INSURANCE

## 2024-06-23 DIAGNOSIS — R339 Retention of urine, unspecified: Secondary | ICD-10-CM | POA: Diagnosis not present

## 2024-06-23 DIAGNOSIS — S3722XA Contusion of bladder, initial encounter: Secondary | ICD-10-CM

## 2024-06-23 DIAGNOSIS — R310 Gross hematuria: Secondary | ICD-10-CM | POA: Diagnosis not present

## 2024-06-23 LAB — RENAL FUNCTION PANEL
Albumin: 2.1 g/dL — ABNORMAL LOW (ref 3.5–5.0)
Anion gap: 12 (ref 5–15)
BUN: 138 mg/dL — ABNORMAL HIGH (ref 6–20)
CO2: 19 mmol/L — ABNORMAL LOW (ref 22–32)
Calcium: 7.7 mg/dL — ABNORMAL LOW (ref 8.9–10.3)
Chloride: 103 mmol/L (ref 98–111)
Creatinine, Ser: 5.45 mg/dL — ABNORMAL HIGH (ref 0.61–1.24)
GFR, Estimated: 12 mL/min — ABNORMAL LOW (ref >60)
Glucose, Bld: 183 mg/dL — ABNORMAL HIGH (ref 70–99)
Phosphorus: 5.7 mg/dL — ABNORMAL HIGH (ref 2.5–4.6)
Potassium: 5.2 mmol/L — ABNORMAL HIGH (ref 3.5–5.1)
Sodium: 134 mmol/L — ABNORMAL LOW (ref 135–145)

## 2024-06-23 LAB — CBC
HCT: 25.5 % — ABNORMAL LOW (ref 39.0–52.0)
Hemoglobin: 8.3 g/dL — ABNORMAL LOW (ref 13.0–17.0)
MCH: 28.8 pg (ref 26.0–34.0)
MCHC: 32.5 g/dL (ref 30.0–36.0)
MCV: 88.5 fL (ref 80.0–100.0)
Platelets: 351 K/uL (ref 150–400)
RBC: 2.88 MIL/uL — ABNORMAL LOW (ref 4.22–5.81)
RDW: 15.3 % (ref 11.5–15.5)
WBC: 15.4 K/uL — ABNORMAL HIGH (ref 4.0–10.5)
nRBC: 0 % (ref 0.0–0.2)

## 2024-06-23 LAB — HEMOGLOBIN AND HEMATOCRIT, BLOOD
HCT: 28.3 % — ABNORMAL LOW (ref 39.0–52.0)
Hemoglobin: 9.1 g/dL — ABNORMAL LOW (ref 13.0–17.0)

## 2024-06-23 LAB — GLUCOSE, CAPILLARY
Glucose-Capillary: 160 mg/dL — ABNORMAL HIGH (ref 70–99)
Glucose-Capillary: 217 mg/dL — ABNORMAL HIGH (ref 70–99)
Glucose-Capillary: 183 mg/dL — ABNORMAL HIGH (ref 70–99)
Glucose-Capillary: 241 mg/dL — ABNORMAL HIGH (ref 70–99)

## 2024-06-23 MED ORDER — SODIUM ZIRCONIUM CYCLOSILICATE 10 G PO PACK
10.0000 g | PACK | Freq: Once | ORAL | Status: AC
  Administered 2024-06-23: 10 g via ORAL
  Filled 2024-06-23: qty 1

## 2024-06-23 MED ORDER — ASPIRIN 81 MG PO TBEC
81.0000 mg | DELAYED_RELEASE_TABLET | Freq: Every day | ORAL | Status: DC
  Administered 2024-06-23: 81 mg via ORAL
  Filled 2024-06-23: qty 1

## 2024-06-23 MED ORDER — BACITRACIN ZINC 500 UNIT/GM EX OINT
TOPICAL_OINTMENT | Freq: Three times a day (TID) | CUTANEOUS | Status: DC | PRN
  Filled 2024-06-23: qty 28.4

## 2024-06-23 MED ORDER — INSULIN ASPART 100 UNIT/ML IJ SOLN
3.0000 [IU] | Freq: Once | INTRAMUSCULAR | Status: AC
  Administered 2024-06-23: 3 [IU] via SUBCUTANEOUS

## 2024-06-23 MED ORDER — BUSPIRONE HCL 5 MG PO TABS
7.5000 mg | ORAL_TABLET | Freq: Two times a day (BID) | ORAL | Status: DC
  Administered 2024-06-23 – 2024-06-28 (×11): 7.5 mg via ORAL
  Filled 2024-06-23 (×11): qty 2

## 2024-06-23 MED ORDER — CHLORHEXIDINE GLUCONATE CLOTH 2 % EX PADS
6.0000 | MEDICATED_PAD | Freq: Every day | CUTANEOUS | Status: DC
  Administered 2024-06-24 – 2024-06-26 (×3): 6 via TOPICAL

## 2024-06-23 MED ORDER — TAMSULOSIN HCL 0.4 MG PO CAPS
0.4000 mg | ORAL_CAPSULE | Freq: Every day | ORAL | Status: DC
  Administered 2024-06-23 – 2024-07-02 (×9): 0.4 mg via ORAL
  Filled 2024-06-23 (×9): qty 1

## 2024-06-23 NOTE — Procedures (Addendum)
   Urology Procedure Note: Procedure: insert indwelling catheter bedside   53 year old male with PMH for ankylosing spondylitis, CKDV-rapidly advancing, s/p renal biopsy, CVA, HTN, T2DM, HLD, bipolar 1, and high functioning autism.  Patient had reduced urinary output and hematuria following biopsy.  Renal ultrasound revealed consolidated clot material and urinary retention.  After discussing the plan and obtaining consent, patient was prepped and draped in the usual sterile fashion.  10cc of lidocaine  infused sterile lubricant was injected directly into the urethra.  After adequate time for analgesic effect was provided, I attempted to place a 66f three-way hematuria coud catheter.  Relatively small urethral meatus would not accommodate this and this was the smallest size hematuria catheter available.  I then attempted a 55f three-way Foley which was advanced with minimal resistance.  There was flash of light red urine but no return.  Balloon was inflated with 30cc of sterile water .  I advanced the catheter slightly further and begin to hand irrigate.  A few very dense firm smaller clots were produced and around 1000 cc of dark reddish-brown urine was returned.  I continued to hand irrigate several through two bottles of sterile water , obtaining about 50mL of clot material.  I believe this was less than was reflected on the pelvic ultrasound.  I then used 1: 5 diluted hydrogen peroxide and sterile water .  This broke up the clot material slightly more and allow me to continue hand irrigating.  Patient was becoming quite uncomfortable by this time and catheter was flowing freely so we elected to stop at this point.  I do not believe CBI will be necessary and all of the blood present was old.  Nursing understands to hand irrigate as needed.  Will monitor for the next couple of days and can attempt voiding trial if urine clears.  If patient continues to clot catheter then we will consider Clot evacuation and  fulguration.  N.p.o. at midnight.   Ole Bourdon, NP [facility] Pager: [phone]

## 2024-06-23 NOTE — Progress Notes (Signed)
 PT Cancellation Note  Patient Details Name: Edward [name] MRN: [id number] DOB: 07-07-1971   Cancelled Treatment:    Reason Eval/Treat Not Completed: Patient at procedure or test/unavailable. Will check back as time allows.   Richerd Lipoma, PT  Acute Rehab Services Secure chat preferred Office [phone]    [name] [name] 06/23/2024, 1:12 PM

## 2024-06-23 NOTE — Progress Notes (Signed)
 OT Cancellation Note  Patient Details Name: Edward [name] MRN: [id number] DOB: 17-Jun-1971   Cancelled Treatment:    Reason Eval/Treat Not Completed: (P) Patient at procedure or test/ unavailable, Pt about to have a procedure done, will check back as time allows  Elouise JONELLE Bott 06/23/2024, 2:31 PM

## 2024-06-23 NOTE — Assessment & Plan Note (Addendum)
 Last BM 06/20/24, continue on current regimen. -Bowel regimen: MiraLax  17 g BID, senna daily

## 2024-06-23 NOTE — Assessment & Plan Note (Addendum)
 Close to oliguric UOP, will continue to monitor but appears to be advancing p.o. intake.  He is now s/p renal biopsy on 9/10 w/ radiology. He has been experiencing blood in urine since his renal biopsy procedure. +/- urinary retention OVN. High BUN level is probably also contributed by steroid use.  - Nephrology consultation, appreciate recommendations                  - Followed by Dr. Melia outpatient. Rapidly progressive CKD.                    - Pending bladder scan and kidney ultrasound.                     - No need for dialysis at this time however it might be coming very soon, patient is aware of it.                  - In the long-term he is interested in home therapy and kidney transplant evaluation. - Renal diet with 1200 ml fluid restriction  - Strict I's and O's - Daily RFP

## 2024-06-23 NOTE — Consult Note (Signed)
 Urology Consult Note   Requesting Attending Physician:  Delores Suzann HERO, MD Service Providing Consult: Urology  Consulting Attending: Dr. Elisabeth   Reason for Consult: Hematuria, clot obstruction of urine  HPI: Edward [name] is seen in consultation for reasons noted above at the request of Delores Suzann HERO, MD. Patient is a 53 y.o. male presenting with clot obstruction of urine following renal biopsy.  Multiple catheter attempts were unable to advance Foley.  PMH significant for ankylosing spondylitis, CKD V, CVA, HTN, T2DM, HLD, bipolar 1, migraines, and high functioning autism.  He presents with back pain in the setting of ankylosing spondylitis flare and had recently undergone renal biopsy on 06/22/2024 with interventional radiology for rapidly advancing stage V kidney disease.  Patient was remotely seen in our office about 7 years ago for vasectomy with Dr. Matilda but has not had any over the urologic problems since.  On my arrival patient was alert, oriented, and in no distress.  He was up in chair and accompanied by his mother.  We reviewed the case and plan and he was nervous but amenable to proceeding.  Please see separate procedure note ------------------  Assessment:   53 y.o. male with clot obstruction of urine following renal biopsy   Recommendations: # Hematuria # Clot obstruction of urine # Difficult Foley placement  I believe the nurses were probably getting the In-N-Out catheter in place, it just was not flowing due to the formed clot material.  We placed a 5f three-way catheter and hand irrigated a significant amount.  Clot material was firm, small, and rubbery.  Difficult to hand irrigate.  Hand irrigate as needed. TOV in next couple of days if hematuria clears N.p.o. at midnight.  If there is more clot material that is unable to be mobilized through the catheter, will take patient to the OR for EVAC and fulguration.  Case and plan discussed with Dr. Elisabeth  Past  Medical History: Past Medical History:  Diagnosis Date   Ankylosing spondylitis (HCC) 1993   Bipolar depression (HCC) 2005   CVA (cerebral vascular accident) (HCC) 02/2024   DM (diabetes mellitus), type 2 (HCC) 2007   Generalized anxiety disorder 1997   High-functioning autism spectrum disorder 2005   Renal disorder    CKD stage 4   Rheumatic fever age 72    Past Surgical History:  Past Surgical History:  Procedure Laterality Date   arthroscopic surgery Left years ago   both knee cartlidge surgery     GASTRIC ROUX-EN-Y N/A 08/06/2015   Procedure: LAPAROSCOPIC ROUX-EN-Y GASTRIC BYPASS WITH UPPER ENDOSCOPY;  Surgeon: Camellia Blush, MD;  Location: [facility];  Service: General;  Laterality: N/A;   HIP SURGERY Left 1986   with knee surgery   KNEE SURGERY Right 1986   benign tumor with bone graft    UPPER GI ENDOSCOPY  08/06/2015   Procedure: UPPER GI ENDOSCOPY;  Surgeon: Camellia Blush, MD;  Location: [facility];  Service: General;;    Medication: Current Facility-Administered Medications  Medication Dose Route Frequency Provider Last Rate Last Admin   acetaminophen  (TYLENOL ) tablet 650 mg  650 mg Oral Q8H Theophilus Pagan, MD   650 mg at 06/23/24 1413   amLODipine  (NORVASC ) tablet 10 mg  10 mg Oral Daily Theophilus Pagan, MD   10 mg at 06/23/24 1037   atorvastatin  (LIPITOR) tablet 40 mg  40 mg Oral Daily Theophilus Pagan, MD   40 mg at 06/23/24 1035   busPIRone  (BUSPAR ) tablet 7.5 mg  7.5  mg Oral BID Delores Suzann HERO, MD   7.5 mg at 06/23/24 1037   carvedilol  (COREG ) tablet 3.125 mg  3.125 mg Oral BID Theophilus Pagan, MD   3.125 mg at 06/23/24 1037   ezetimibe  (ZETIA ) tablet 10 mg  10 mg Oral Daily Theophilus Pagan, MD   10 mg at 06/23/24 1035   feeding supplement (GLUCERNA SHAKE) (GLUCERNA SHAKE) liquid 237 mL  237 mL Oral TID BM Tharon Lung, MD   237 mL at 06/22/24 1109   HYDROcodone -acetaminophen  (NORCO/VICODIN) 5-325 MG per tablet 1-2 tablet  1-2 tablet Oral Q4H PRN Johann Sieving, MD   1  tablet at 06/23/24 9356   insulin  aspart (novoLOG ) injection 0-9 Units  0-9 Units Subcutaneous TID WC Paytes, Austin A, RPH   3 Units at 06/23/24 1403   lidocaine  (LIDODERM ) 5 % 2 patch  2 patch Transdermal Q24H Pruett, Milda CROME, MD   2 patch at 06/23/24 1038   oxyCODONE  (Oxy IR/ROXICODONE ) immediate release tablet 5 mg  5 mg Oral Q4H PRN Theophilus Pagan, MD   5 mg at 06/23/24 1035   Or   oxyCODONE  (Oxy IR/ROXICODONE ) immediate release tablet 10 mg  10 mg Oral Q4H PRN Theophilus Pagan, MD   10 mg at 06/23/24 1505   pantoprazole  (PROTONIX ) EC tablet 40 mg  40 mg Oral Daily Theophilus Pagan, MD   40 mg at 06/23/24 1037   polyethylene glycol (MIRALAX  / GLYCOLAX ) packet 17 g  17 g Oral BID Suknaim, Kulkaew B, DO   17 g at 06/19/24 2223   predniSONE  (DELTASONE ) tablet 30 mg  30 mg Oral Q breakfast Tharon Lung, MD   30 mg at 06/23/24 1034   Followed by   NOREEN ON 06/25/2024] predniSONE  (DELTASONE ) tablet 20 mg  20 mg Oral Q breakfast Tharon Lung, MD       Followed by   NOREEN ON 06/27/2024] predniSONE  (DELTASONE ) tablet 10 mg  10 mg Oral Q breakfast Mabe, Lung, MD       QUEtiapine  (SEROQUEL ) tablet 25 mg  25 mg Oral QHS Theophilus Pagan, MD   25 mg at 06/22/24 2245   senna (SENOKOT) tablet 17.2 mg  2 tablet Oral Daily Suknaim, Kulkaew B, DO   17.2 mg at 06/23/24 1035   sodium bicarbonate  tablet 1,300 mg  1,300 mg Oral BID Dennise Hoes, MD   1,300 mg at 06/23/24 1037   tamsulosin  (FLOMAX ) capsule 0.4 mg  0.4 mg Oral Daily Gomes, Adriana, DO   0.4 mg at 06/23/24 1402   tiZANidine  (ZANAFLEX ) tablet 4 mg  4 mg Oral Q8H PRN Theophilus Pagan, MD   4 mg at 06/23/24 1035    Allergies: Allergies  Allergen Reactions   Demerol [Meperidine] Swelling    Swelling of hands, neck   Nsaids Other (See Comments)    Gi upset; cannot take due to kidney disease.   Tapentadol Other (See Comments)    Skin crawling;cannot take due to Kidney Disease    Tramadol  Other (See Comments)    Cannot take due to kidney  disease.     Social History: Social History   Tobacco Use   Smoking status: Former    Current packs/day: 0.00    Average packs/day: 0.3 packs/day for 10.0 years (2.5 ttl pk-yrs)    Types: Cigarettes    Start date: 04/16/2002    Quit date: 04/16/2012    Years since quitting: 12.1   Smokeless tobacco: Never  Vaping Use   Vaping status: Never Used  Substance  Use Topics   Alcohol use: No    Alcohol/week: 0.0 standard drinks of alcohol   Drug use: No    Family History Family History  Problem Relation Age of Onset   Hyperlipidemia Mother    Hypertension Mother    Breast cancer Mother    Thyroid  disease Father    Early death Father 60       Cancer   Diabetes Father    Autoimmune disease Father        Ankylosing Spondylitis   Squamous cell carcinoma Father    Diabetes Brother    Hyperlipidemia Brother    Hypertension Brother    Thyroid  disease Brother    Alcohol abuse Brother    Heart disease Maternal Grandmother    Stroke Maternal Grandmother    Heart disease Maternal Grandfather    Autoimmune disease Paternal Grandfather     Review of Systems  Genitourinary:  Positive for hematuria. Negative for dysuria, flank pain, frequency and urgency.     Objective   Vital signs in last 24 hours: BP 130/73 (BP Location: Right Arm)   Pulse 63   Temp 97.7 F (36.5 C) (Oral)   Resp 16   Ht 6' (1.829 m)   Wt 122.5 kg   SpO2 100%   BMI 36.62 kg/m   Physical Exam General: A&O, resting, appropriate HEENT: [place]/AT Pulmonary: Normal work of breathing Cardiovascular: no cyanosis Abdomen: Soft, NTTP, nondistended GU: Three-way 63f Foley catheter in place draining light red urine.   Most Recent Labs: Lab Results  Component Value Date   WBC 15.4 (H) 06/23/2024   HGB 8.3 (L) 06/23/2024   HCT 25.5 (L) 06/23/2024   PLT 351 06/23/2024    Lab Results  Component Value Date   NA 134 (L) 06/23/2024   K 5.2 (H) 06/23/2024   CL 103 06/23/2024   CO2 19 (L) 06/23/2024   BUN 138  (H) 06/23/2024   CREATININE 5.45 (H) 06/23/2024   CALCIUM  7.7 (L) 06/23/2024   MG 2.1 05/25/2024   PHOS 5.7 (H) 06/23/2024    Lab Results  Component Value Date   INR 1.1 06/22/2024   APTT 40 (H) 06/15/2024     Urine Culture: @[id number] (laburin,org,[id number],[id number])@   IMAGING: US  RENAL Result Date: 06/23/2024 CLINICAL DATA:  [id number] Hematuria 358241. EXAM: RENAL / URINARY TRACT ULTRASOUND COMPLETE COMPARISON:  CT scan abdomen and pelvis from 05/22/2024. FINDINGS: Right Kidney: Renal measurements: 6.1 x 6.8 x 11.8 cm = volume: 254.6 mL. Echogenicity within normal limits. No mass or hydronephrosis visualized. Left Kidney: Renal measurements: 7.2 x 7.5 x 12.4 cm = volume: 351.0 mL. Echogenicity within normal limits. No mass or hydronephrosis visualized. There is 8-9 mm thick hypo to anechoic rim around the left kidney, likely representing perirenal hemorrhage in the settings of recent renal biopsy. Bladder: Ureteric is distended. There is an approximately 7.3 x 9.4 cm avascular heterogeneous area in the urinary bladder, highly concerning for bladder hematoma. Other: None. IMPRESSION: 1. There is an approximately 7.3 x 9.4 cm avascular heterogeneous area in the urinary bladder, highly concerning for bladder hematoma. 2. There is 8-9 mm thick hypo to anechoic rim around the left kidney, likely representing perirenal hemorrhage in the settings of recent renal biopsy. 3. Otherwise unremarkable renal sonogram. Electronically Signed   By: Ree Molt M.D.   On: 06/23/2024 13:36   US  BIOPSY (KIDNEY) Result Date: 06/22/2024 CLINICAL DATA:  Proteinuria EXAM: ULTRASOUND GUIDED RENAL CORE BIOPSY COMPARISON:  CT 05/22/2024 TECHNIQUE: Survey ultrasound was performed  and an appropriate skin entry site was localized. Site was marked, prepped with Betadine, draped in usual sterile fashion, infiltrated locally with 1% lidocaine . Intravenous Fentanyl  50mcg and Versed  2mg  were administered as conscious sedation during  continuous monitoring of the patient's level of consciousness and physiological / cardiorespiratory status by the radiology RN, with a total moderate sedation time of 11 minutes. Under real time ultrasound guidance, a 17 gauge trocar needle was advanced to the margin of the lower pole of the left kidney for 3 coaxial 18 gauge core biopsy needle passes. The core samples were submitted to pathology. The patient tolerated procedure well. COMPLICATIONS: None. IMPRESSION: 1. Technically successful ultrasound-guided core renal biopsy , left lower pole. Electronically Signed   By: JONETTA Faes M.D.   On: 06/22/2024 17:02    ------  Ole Bourdon, NP Pager: [phone]   Please contact the urology consult pager with any further questions/concerns.

## 2024-06-23 NOTE — Assessment & Plan Note (Addendum)
-   Stop Atarax   - Continue Flomax  0.4 mg  - Given challenges with catheterization will consult Urology

## 2024-06-23 NOTE — Plan of Care (Cosign Needed Addendum)
 Consulted Ole Bourdon NP with urology regarding patient's urinary retention post biopsy.Catheter placement failed, would not advance and produced bloody return. Follow up renal ultrasound revealed bladder hematoma. Urology will come and assess. Appreciate assistance.  Marsa ONEIDA Kenning MS4 AI

## 2024-06-23 NOTE — Assessment & Plan Note (Addendum)
 Hx of CKD with mildly hyperkalemia. Received Lokelma  x 2 since admission - Daily BMP - Lokelma  X 1 9/10

## 2024-06-23 NOTE — Progress Notes (Addendum)
 Daily Progress Note Intern Pager: [phone]  Patient name: Edward [name] Medical record number: [id number] Date of birth: 1971/09/13 Age: 53 y.o. Gender: male  Primary Care Provider: Donzetta Rollene BRAVO, MD Consultants: Nephrology, Dietitian  Code Status: Full  Pt Overview and Major Events to Date:  9/4 : Admitted for back pain  9/11 : Renal biopsy with IR     Assessment & Plan 53 year old male with PMHx ankylosing spondylitis, CKDV, CVA, HTN, T2DM and, HLD, bipolar 1 and migraines presenting with back pain in the setting of flare of ankylosing spondylitis . Patient undergone Renal Biopsy on 06/22/24 w/ IR Assessment & Plan Ankylosing spondylitis flare Symptomatically improved, feels stable on PO pain meds and agreeable to d/c IV pain meds.  Continue to mobilize and plan for acute rehab. - Continue prednisone  taper - Pain regimen: - Acetaminophen  650 mg every 8 hours scheduled - Oxycodone  5-10 mg every 4 hours as needed for moderate/severe pain - D/c Dilaudid  0.5 mg IV every 6 hours - Lidocaine  patches - Tizanidine  4 mg every 8 hours as needed - Need outpatient f/u with Rheumatology  - PT/OT : recommending CRI  CKD (chronic kidney disease), stage V (HCC) Close to oliguric UOP, will continue to monitor but appears to be advancing p.o. intake.  He is now s/p renal biopsy on 9/10 w/ radiology. He has been experiencing blood in urine since his renal biopsy procedure. +/- urinary retention OVN. High BUN level is probably also contributed by steroid use.  - Nephrology consultation, appreciate recommendations                  - Followed by Dr. Melia outpatient. Rapidly progressive CKD.                    - Pending bladder scan and kidney ultrasound.                     - No need for dialysis at this time however it might be coming very soon, patient is aware of it.                  - In the long-term he is interested in home therapy and kidney transplant evaluation. - Renal diet with 1200  ml fluid restriction  - Strict I's and O's - Daily RFP  Hyperkalemia Hx of CKD with mildly hyperkalemia. Received Lokelma  x 2 since admission - Daily BMP - Lokelma  X 1 9/10  Constipation Last BM 06/20/24, continue on current regimen. -Bowel regimen: MiraLax  17 g BID, senna daily Urinary retention - Stop Atarax   - Continue Flomax  0.4 mg  - Given challenges with catheterization will consult Urology  Chronic health problem HTN : Continue home amLODipine  10 mg daily, Carrvedilol 3.125 mg BID Hx of Stroke : Starting back on ASA 81 mg daily  DMII : SSI  HLD : Continue Ezetimibe  10 mg daily Bipolar 1 disorder : Continue QUEtiapine  25 mg at bedtime Depression : Stop Atarax  starting on Buspar  7.5 mg daily  Migraines OCD  FEN/GI: Renal diet, Protonix  EC 40 mg daily PPx: SQH  Dispo:SNF pending clinical improvement .  Subjective:  Edward [name] is a 53 y.o. male presenting with back pain. NAEON Pt states doing well w/ well control pain. However he is c/o blood in urine since renal biopsy procedure. State probably also experiencing urinary retention. Not feel like he is ready to leave the hospital yet.   Objective: Temp:  [  97.5 F (36.4 C)-97.9 F (36.6 C)] 97.9 F (36.6 C) (09/11 0742) Pulse Rate:  [63-69] 63 (09/11 0742) Resp:  [16-18] 16 (09/11 0742) BP: (113-138)/(66-82) 113/66 (09/11 0742) SpO2:  [97 %-99 %] 98 % (09/11 0742)  Physical Exam Cardiovascular:     Rate and Rhythm: Normal rate.     Pulses: Normal pulses.  Pulmonary:     Effort: Pulmonary effort is normal.     Breath sounds: Normal breath sounds.  Abdominal:     Palpations: Abdomen is soft.  Musculoskeletal:        General: Tenderness (back) present.  Skin:    General: Skin is warm and dry.     Capillary Refill: Capillary refill takes less than 2 seconds.  Neurological:     Mental Status: He is oriented to person, place, and time. Mental status is at baseline.     Sensory: Sensory deficit (residual right  sensory changes on R arm and leg,) present.  Psychiatric:        Mood and Affect: Mood normal.        Behavior: Behavior normal.   Laboratory: Most recent CBC Lab Results  Component Value Date   WBC 15.4 (H) 06/23/2024   HGB 8.3 (L) 06/23/2024   HCT 25.5 (L) 06/23/2024   MCV 88.5 06/23/2024   PLT 351 06/23/2024   Most recent BMP    Latest Ref Rng & Units 06/23/2024    5:00 AM  BMP  Glucose 70 - 99 mg/dL 816   BUN 6 - 20 mg/dL 861   Creatinine 9.38 - 1.24 mg/dL 4.54   Sodium 864 - 854 mmol/L 134   Potassium 3.5 - 5.1 mmol/L 5.2   Chloride 98 - 111 mmol/L 103   CO2 22 - 32 mmol/L 19   Calcium  8.9 - 10.3 mg/dL 7.7     Imaging/Diagnostic Tests: EXAM: MRI SACRUM WITHOUT CONTRAST IMPRESSION: 1. Symmetric marrow edema along the sacroiliac joints with suspected erosions, favoring sacroiliitis related to ankylosing spondylitis. 2. Tendinopathy and potentially mild partial tearing of the right proximal hamstring tendon. Minimal tendinopathy of the left proximal hamstring tendon. 3. Trace edema in the bilateral hip adductor musculature adjacent to the pubis. 4. Subtle endplate edema at O5-4 and L5-S1.  Edward Houston NOVAK, DO 06/23/2024, 9:41 AM  PGY-1, [facility] FPTS Intern pager: [phone], text pages welcome Secure chat group [facility] Teaching Service

## 2024-06-23 NOTE — Assessment & Plan Note (Addendum)
 HTN : Continue home amLODipine  10 mg daily, Carrvedilol 3.125 mg BID Hx of Stroke : Starting back on ASA 81 mg daily  DMII : SSI  HLD : Continue Ezetimibe  10 mg daily Bipolar 1 disorder : Continue QUEtiapine  25 mg at bedtime Depression : Stop Atarax  starting on Buspar  7.5 mg daily  Migraines OCD

## 2024-06-23 NOTE — Progress Notes (Signed)
 [facility] NEPHROLOGY PROGRESS NOTE  Assessment/ Plan: Pt is a 53 y.o. yo male   # CKD5 -followed by Dr. Melia outpatient. Rapidly progressive CKD.  UPCR almost 14 g.OP lab  including ANCA, ASO, complement level, anti dsDNA ab, Hep B, Hep C, anti-GBM, anti-PLA2R ab negative. -s/p kidney biopsy on 9/10, the result is not available yet.  He reports some hematuria and less urine output this morning although abdominal exam is benign.  Noted hemoglobin is downtrending.  I am going to get a bladder scan and kidney ultrasound.  High BUN level is probably also contributed by steroid use.  No need for dialysis at this time however it might be coming very soon, patient is aware of it. - In the long-term he is interested in home therapy and kidney transplant evaluation.   #Ankylosing spondylitis - With flare - Per primary, on prednisone  with dose taper.   #Hyperkalemia - Renal diet - Treating with Lokelma  as needed.  #Acidosis - Continue sodium bicarbonate , follow labs.   #History of ischemic stroke - Recent admission, was discharged on 8/13.  Status post DAPT, plan per discharge summary was to keep aspirin  on until renal biopsy.  Discussed with primary service, held aspirin  (last dose 06/17/24)   #Hypertension - BP currently acceptable on amlodipine  and carvedilol    #Anemia of CKD - Iron sat 51 %, received Aranesp  on 9/10, agree with checking hemoglobin later today.    Subjective: Seen and examined.  He reports pinkish urine and less urine output this morning.  He also thinks that he is not drinking as much.  No abdominal pain.  Denies  nausea or vomiting.  No dizziness.  Reports some anxiety therefore started BuSpar  by primary team. Objective Vital signs in last 24 hours: Vitals:   06/22/24 0945 06/22/24 1540 06/22/24 2252 06/23/24 0742  BP: 138/81 135/82 136/81 113/66  Pulse: 69 66 66 63  Resp: 17 16 18 16   Temp:  97.7 F (36.5 C) (!) 97.5 F (36.4 C) 97.9 F (36.6 C)   TempSrc:  Oral Oral Oral  SpO2: 98% 97% 99% 98%  Weight:      Height:       Weight change:   Intake/Output Summary (Last 24 hours) at 06/23/2024 0920 Last data filed at 06/22/2024 1747 Gross per 24 hour  Intake --  Output 30 ml  Net -30 ml       Labs: RENAL PANEL Recent Labs  Lab 06/19/24 0437 06/20/24 0653 06/21/24 0351 06/22/24 0510 06/23/24 0500  NA 132* 134* 131* 137 134*  K 5.3* 5.1 5.2* 5.0 5.2*  CL 105 104 104 104 103  CO2 15* 18* 17* 20* 19*  GLUCOSE 223* 192* 227* 196* 183*  BUN 120* 118* 125* 130* 138*  CREATININE 5.37* 5.12* 5.17* 4.89* 5.45*  CALCIUM  8.0* 8.0* 7.7* 7.8* 7.7*  PHOS 6.2* 5.6* 5.3* 4.8* 5.7*  ALBUMIN 1.9* 2.0* 2.0* 2.1* 2.1*    Liver Function Tests: Recent Labs  Lab 06/21/24 0351 06/22/24 0510 06/23/24 0500  ALBUMIN 2.0* 2.1* 2.1*   No results for input(s): LIPASE, AMYLASE in the last 168 hours. No results for input(s): AMMONIA in the last 168 hours. CBC: Recent Labs    03/21/24 1037 05/09/24 1159 06/16/24 0323 06/17/24 9357 06/18/24 9367 06/19/24 0437 06/22/24 0510 06/23/24 0500  HGB 11.3*   < > 10.4* 10.5* 10.2*  --  9.2* 8.3*  MCV 93   < > 89.9 88.0 89.3  --  86.8 88.5  FERRITIN  60  --   --   --   --  120  --   --   TIBC 298  --   --   --   --  209*  --   --   IRON 52  --   --   --   --  107  --   --    < > = values in this interval not displayed.    Cardiac Enzymes: No results for input(s): CKTOTAL, CKMB, CKMBINDEX, TROPONINI in the last 168 hours.  CBG: Recent Labs  Lab 06/22/24 0541 06/22/24 1115 06/22/24 1637 06/22/24 2150 06/23/24 0655  GLUCAP 169* 134* 286* 245* 160*    Iron Studies:  No results for input(s): IRON, TIBC, TRANSFERRIN, FERRITIN in the last 72 hours.  Studies/Results: US  BIOPSY (KIDNEY) Result Date: 06/22/2024 CLINICAL DATA:  Proteinuria EXAM: ULTRASOUND GUIDED RENAL CORE BIOPSY COMPARISON:  CT 05/22/2024 TECHNIQUE: Survey ultrasound was performed and an  appropriate skin entry site was localized. Site was marked, prepped with Betadine, draped in usual sterile fashion, infiltrated locally with 1% lidocaine . Intravenous Fentanyl  50mcg and Versed  2mg  were administered as conscious sedation during continuous monitoring of the patient's level of consciousness and physiological / cardiorespiratory status by the radiology RN, with a total moderate sedation time of 11 minutes. Under real time ultrasound guidance, a 17 gauge trocar needle was advanced to the margin of the lower pole of the left kidney for 3 coaxial 18 gauge core biopsy needle passes. The core samples were submitted to pathology. The patient tolerated procedure well. COMPLICATIONS: None. IMPRESSION: 1. Technically successful ultrasound-guided core renal biopsy , left lower pole. Electronically Signed   By: JONETTA Faes M.D.   On: 06/22/2024 17:02    Medications: Infusions:   Scheduled Medications:  acetaminophen   650 mg Oral Q8H   amLODipine   10 mg Oral Daily   atorvastatin   40 mg Oral Daily   busPIRone   7.5 mg Oral BID   carvedilol   3.125 mg Oral BID   ezetimibe   10 mg Oral Daily   feeding supplement (GLUCERNA SHAKE)  237 mL Oral TID BM   heparin   5,000 Units Subcutaneous Q8H   insulin  aspart  0-9 Units Subcutaneous TID WC   lidocaine   2 patch Transdermal Q24H   pantoprazole   40 mg Oral Daily   polyethylene glycol  17 g Oral BID   predniSONE   30 mg Oral Q breakfast   Followed by   NOREEN ON 06/25/2024] predniSONE   20 mg Oral Q breakfast   Followed by   NOREEN ON 06/27/2024] predniSONE   10 mg Oral Q breakfast   QUEtiapine   25 mg Oral QHS   senna  2 tablet Oral Daily   sodium bicarbonate   1,300 mg Oral BID    have reviewed scheduled and prn medications.  Physical Exam: General:NAD, comfortable Heart:RRR, s1s2 nl Lungs:clear b/l, no crackle Abdomen: Soft, nontender.  Distended due to obesity. Extremities:No edema Neurology: Alert awake following commands  Nicholaus Steinke Amelie Romney 06/23/2024,9:20 AM  LOS: 7 days

## 2024-06-23 NOTE — Plan of Care (Signed)

## 2024-06-23 NOTE — Progress Notes (Signed)
 PT Cancellation Note  Patient Details Name: Edward [name] MRN: [id number] DOB: March 28, 1971   Cancelled Treatment:    Reason Eval/Treat Not Completed: Patient declined, no reason specified (Pt reports they just inserted a catheter and found a blood clot in bladder. He is fatigued and declines physical therapy at this time. Will follow up as able and appropriate.)  Dorothyann Maier, DPT, CLT  Acute Rehabilitation Services Office: [phone] (Secure chat preferred)   Dorothyann VEAR Maier 06/23/2024, 4:35 PM

## 2024-06-23 NOTE — Assessment & Plan Note (Addendum)
 Symptomatically improved, feels stable on PO pain meds and agreeable to d/c IV pain meds.  Continue to mobilize and plan for acute rehab. - Continue prednisone  taper - Pain regimen: - Acetaminophen  650 mg every 8 hours scheduled - Oxycodone  5-10 mg every 4 hours as needed for moderate/severe pain - D/c Dilaudid  0.5 mg IV every 6 hours - Lidocaine  patches - Tizanidine  4 mg every 8 hours as needed - Need outpatient f/u with Rheumatology  - PT/OT : recommending CRI

## 2024-06-24 ENCOUNTER — Other Ambulatory Visit (HOSPITAL_COMMUNITY): Payer: Self-pay

## 2024-06-24 ENCOUNTER — Encounter (HOSPITAL_COMMUNITY): Payer: Self-pay

## 2024-06-24 ENCOUNTER — Inpatient Hospital Stay (HOSPITAL_COMMUNITY): Payer: PRIVATE HEALTH INSURANCE

## 2024-06-24 DIAGNOSIS — R339 Retention of urine, unspecified: Secondary | ICD-10-CM | POA: Diagnosis not present

## 2024-06-24 DIAGNOSIS — M459 Ankylosing spondylitis of unspecified sites in spine: Secondary | ICD-10-CM

## 2024-06-24 HISTORY — PX: IR TUNNELED CENTRAL VENOUS CATH PLC W IMG: IMG1939

## 2024-06-24 LAB — CBC
HCT: 25.8 % — ABNORMAL LOW (ref 39.0–52.0)
Hemoglobin: 8.4 g/dL — ABNORMAL LOW (ref 13.0–17.0)
MCH: 28.7 pg (ref 26.0–34.0)
MCHC: 32.6 g/dL (ref 30.0–36.0)
MCV: 88.1 fL (ref 80.0–100.0)
Platelets: 338 K/uL (ref 150–400)
RBC: 2.93 MIL/uL — ABNORMAL LOW (ref 4.22–5.81)
RDW: 15.2 % (ref 11.5–15.5)
WBC: 15 K/uL — ABNORMAL HIGH (ref 4.0–10.5)
nRBC: 0 % (ref 0.0–0.2)

## 2024-06-24 LAB — BASIC METABOLIC PANEL WITH GFR
Anion gap: 10 (ref 5–15)
BUN: 149 mg/dL — ABNORMAL HIGH (ref 6–20)
CO2: 19 mmol/L — ABNORMAL LOW (ref 22–32)
Calcium: 7.4 mg/dL — ABNORMAL LOW (ref 8.9–10.3)
Chloride: 106 mmol/L (ref 98–111)
Creatinine, Ser: 6.35 mg/dL — ABNORMAL HIGH (ref 0.61–1.24)
GFR, Estimated: 10 mL/min — ABNORMAL LOW (ref >60)
Glucose, Bld: 179 mg/dL — ABNORMAL HIGH (ref 70–99)
Potassium: 5.4 mmol/L — ABNORMAL HIGH (ref 3.5–5.1)
Sodium: 135 mmol/L (ref 135–145)

## 2024-06-24 LAB — GLUCOSE, CAPILLARY
Glucose-Capillary: 153 mg/dL — ABNORMAL HIGH (ref 70–99)
Glucose-Capillary: 111 mg/dL — ABNORMAL HIGH (ref 70–99)
Glucose-Capillary: 236 mg/dL — ABNORMAL HIGH (ref 70–99)
Glucose-Capillary: 186 mg/dL — ABNORMAL HIGH (ref 70–99)
Glucose-Capillary: 289 mg/dL — ABNORMAL HIGH (ref 70–99)

## 2024-06-24 LAB — ALBUMIN: Albumin: 2.1 g/dL — ABNORMAL LOW (ref 3.5–5.0)

## 2024-06-24 LAB — HEPATITIS B SURFACE ANTIGEN: Hepatitis B Surface Ag: NONREACTIVE

## 2024-06-24 LAB — SURGICAL PATHOLOGY

## 2024-06-24 MED ORDER — SODIUM ZIRCONIUM CYCLOSILICATE 10 G PO PACK
10.0000 g | PACK | Freq: Once | ORAL | Status: AC
  Administered 2024-06-24: 10 g via ORAL
  Filled 2024-06-24 (×2): qty 1

## 2024-06-24 MED ORDER — ALTEPLASE 2 MG IJ SOLR: 2.0000 mg | Freq: Once | INTRAMUSCULAR | Status: DC | PRN

## 2024-06-24 MED ORDER — LIDOCAINE-EPINEPHRINE 1 %-1:100000 IJ SOLN
INTRAMUSCULAR | Status: AC
  Filled 2024-06-24: qty 1

## 2024-06-24 MED ORDER — HYDROMORPHONE HCL 1 MG/ML IJ SOLN
INTRAMUSCULAR | Status: AC
  Filled 2024-06-24: qty 1

## 2024-06-24 MED ORDER — CEFAZOLIN SODIUM-DEXTROSE 2-4 GM/100ML-% IV SOLN
INTRAVENOUS | Status: AC | PRN
  Administered 2024-06-24: 2 g via INTRAVENOUS

## 2024-06-24 MED ORDER — LIDOCAINE HCL 1 % IJ SOLN
INTRAMUSCULAR | Status: AC
  Filled 2024-06-24: qty 20

## 2024-06-24 MED ORDER — MIDAZOLAM HCL 2 MG/2ML IJ SOLN
INTRAMUSCULAR | Status: AC
  Filled 2024-06-24: qty 2

## 2024-06-24 MED ORDER — HYDROMORPHONE HCL 1 MG/ML IJ SOLN
INTRAMUSCULAR | Status: AC | PRN
  Administered 2024-06-24: .5 mg via INTRAVENOUS

## 2024-06-24 MED ORDER — SODIUM CHLORIDE 0.9 % IV SOLN
20.0000 ug | Freq: Once | INTRAVENOUS | Status: AC
  Administered 2024-06-24: 20 ug via INTRAVENOUS
  Filled 2024-06-24: qty 5

## 2024-06-24 MED ORDER — HEPARIN SODIUM (PORCINE) 1000 UNIT/ML IJ SOLN
INTRAMUSCULAR | Status: AC
Start: 2024-06-24 — End: 2024-06-24
  Filled 2024-06-24: qty 4

## 2024-06-24 MED ORDER — SODIUM CHLORIDE 0.9 % IR SOLN
3000.0000 mL | Status: DC
  Administered 2024-06-24 – 2024-06-29 (×6): 3000 mL
  Filled 2024-06-24 (×2): qty 3000

## 2024-06-24 MED ORDER — CEFAZOLIN SODIUM-DEXTROSE 2-4 GM/100ML-% IV SOLN
INTRAVENOUS | Status: AC
  Filled 2024-06-24: qty 100

## 2024-06-24 MED ORDER — CHLORHEXIDINE GLUCONATE CLOTH 2 % EX PADS
6.0000 | MEDICATED_PAD | Freq: Every day | CUTANEOUS | Status: DC
  Administered 2024-06-24 – 2024-06-26 (×3): 6 via TOPICAL

## 2024-06-24 MED ORDER — FENTANYL CITRATE (PF) 100 MCG/2ML IJ SOLN
INTRAMUSCULAR | Status: AC
  Filled 2024-06-24: qty 2

## 2024-06-24 MED ORDER — FENTANYL CITRATE (PF) 100 MCG/2ML IJ SOLN
INTRAMUSCULAR | Status: AC | PRN
  Administered 2024-06-24 (×2): 25 ug via INTRAVENOUS
  Administered 2024-06-24: 50 ug via INTRAVENOUS

## 2024-06-24 MED ORDER — HEPARIN SODIUM (PORCINE) 1000 UNIT/ML IJ SOLN
INTRAMUSCULAR | Status: AC
  Filled 2024-06-24: qty 10

## 2024-06-24 MED ORDER — MIDAZOLAM HCL 2 MG/2ML IJ SOLN
INTRAMUSCULAR | Status: AC | PRN
  Administered 2024-06-24: .5 mg via INTRAVENOUS
  Administered 2024-06-24: 1 mg via INTRAVENOUS
  Administered 2024-06-24: .5 mg via INTRAVENOUS

## 2024-06-24 MED ORDER — HEPARIN SODIUM (PORCINE) 1000 UNIT/ML DIALYSIS
1000.0000 [IU] | INTRAMUSCULAR | Status: DC | PRN
  Administered 2024-06-24: 1000 [IU]

## 2024-06-24 NOTE — Consult Note (Signed)
 Chief Complaint:  Rapidly progressing CKD  Procedure: Tunneled Hemodialysis Catheter   Referring Provider(s): Dr. DEMETRIO Romney  Supervising Physician: Philip Cornet  Patient Status: [facility] - In-pt  History of Present Illness: Edward [name] is a 53 y.o. male with a history of rapidly progressing CKD, T2DM, HTN, and ankylosing spondylitis known to IR from recent renal biopsy on 9/10 with Dr. Johann. Patient has known CKD stage V and has been discussing peritoneal dialysis and kidney transplant evaluation with Nephrology. They were hoping to be able to hold off on dialysis while inpatient; unfortunately patient's renal function continues to rapidly worsen with Potassium now up to 5.4. IR consulted for tunneled HD catheter placement.   Patient reports continuing to have hematuria since his renal biopsy two days ago. Recent renal US  on 9/11 with bladder hematoma as well as perirenal hemorrhage. Patient denies any flank pain or pelvic pain at this time. Reports some discomfort in his back due to minimal time out of bed. Otherwise doing well. NPO since midnight. All questions and concerns answered at the bedside.   Patient is Full Code  Past Medical History:  Diagnosis Date   Ankylosing spondylitis (HCC) 1993   Bipolar depression (HCC) 2005   CVA (cerebral vascular accident) (HCC) 02/2024   DM (diabetes mellitus), type 2 (HCC) 2007   Generalized anxiety disorder 1997   High-functioning autism spectrum disorder 2005   Renal disorder    CKD stage 4   Rheumatic fever age 76    Past Surgical History:  Procedure Laterality Date   arthroscopic surgery Left years ago   both knee cartlidge surgery     GASTRIC ROUX-EN-Y N/A 08/06/2015   Procedure: LAPAROSCOPIC ROUX-EN-Y GASTRIC BYPASS WITH UPPER ENDOSCOPY;  Surgeon: Camellia Blush, MD;  Location: [facility] ORS;  Service: General;  Laterality: N/A;   HIP SURGERY Left 1986   with knee surgery   KNEE SURGERY Right 1986   benign tumor with bone graft     UPPER GI ENDOSCOPY  08/06/2015   Procedure: UPPER GI ENDOSCOPY;  Surgeon: Camellia Blush, MD;  Location: [facility];  Service: General;;    Allergies: Demerol [meperidine], Nsaids, Tapentadol, and Tramadol   Medications: Prior to Admission medications   Medication Sig Start Date End Date Taking? Authorizing Provider  acetaminophen  (ACETAMINOPHEN  8 HOUR) 650 MG CR tablet Take 1,300 mg by mouth 2 (two) times daily.   Yes [provider]  amLODipine  (NORVASC ) 10 MG tablet Take 1 tablet (10 mg total) by mouth daily. 05/09/24  Yes Myrna Bitters, DO  aspirin  EC 81 MG tablet Take 1 tablet (81 mg total) by mouth daily. Swallow whole. 05/25/24  Yes Everhart, Kirstie, DO  atorvastatin  (LIPITOR) 40 MG tablet Take 1 tablet (40 mg total) by mouth daily. 05/25/24  Yes Everhart, Kirstie, DO  carvedilol  (COREG ) 3.125 MG tablet Take 3.125 mg by mouth 2 (two) times daily. 05/18/24  Yes [provider]  diclofenac Sodium (VOLTAREN) 1 % GEL Apply 4 g topically in the morning and at bedtime. Apply topically to neck and lumbar spine twice per day.   Yes [provider]  ezetimibe  (ZETIA ) 10 MG tablet Take 1 tablet (10 mg total) by mouth daily. 05/25/24  Yes Everhart, Kirstie, DO  ferrous sulfate  325 (65 FE) MG tablet Take 1 tablet (325 mg total) by mouth daily with breakfast. 03/21/24  Yes Pray, Rollene BRAVO, MD  pantoprazole  (PROTONIX ) 40 MG tablet Take 1 tablet (40 mg total) by mouth daily. 05/25/24  Yes Everhart, Elyce,  DO  QUEtiapine  (SEROQUEL ) 25 MG tablet Take 1 tablet 25mg  at night for three days, if not oversedated can increase to 2 tablets 50mg  total at night. Patient taking differently: 50 mg. 06/03/24  Yes Pray, Rollene BRAVO, MD  feeding supplement, GLUCERNA SHAKE, (GLUCERNA SHAKE) LIQD Take 237 mLs by mouth 3 (three) times daily between meals. 06/22/24   Gomes, Adriana, DO  lidocaine  (LIDODERM ) 5 % Place 2 patches onto the skin daily. Remove & Discard patch within 12 hours or as directed by MD  06/23/24   Janna Ferrier, DO  oxyCODONE  (OXY IR/ROXICODONE ) 5 MG immediate release tablet Take 1 tablet (5 mg total) by mouth every 6 (six) hours as needed for up to 14 days for severe pain (pain score 7-10) or moderate pain (pain score 4-6). 06/22/24 07/06/24  Gomes, Adriana, DO  polyethylene glycol powder (GLYCOLAX /MIRALAX ) 17 GM/SCOOP powder Dissolve 1 capful (17g) in 4-8 ounces of liquid and take by mouth twice daily 06/22/24   Gomes, Adriana, DO  predniSONE  (DELTASONE ) 10 MG tablet Take 3 tablets (30 mg total) by mouth daily with breakfast for 2 days, THEN 2 tablets (20 mg total) daily with breakfast for 2 days, THEN 1 tablet (10 mg total) daily with breakfast for 2 days. 06/23/24 06/29/24  Gomes, Adriana, DO  senna (SENOKOT) 8.6 MG TABS tablet Take 2 tablets (17.2 mg total) by mouth daily. 06/23/24   Gomes, Adriana, DO  sodium bicarbonate  650 MG tablet Take 2 tablets (1,300 mg total) by mouth 2 (two) times daily. 06/22/24   Gomes, Adriana, DO  tiZANidine  (ZANAFLEX ) 4 MG tablet Take 1 tablet (4 mg total) by mouth every 8 (eight) hours as needed for muscle spasms. 06/22/24   Janna Ferrier, DO     Family History  Problem Relation Age of Onset   Hyperlipidemia Mother    Hypertension Mother    Breast cancer Mother    Thyroid  disease Father    Early death Father 41       Cancer   Diabetes Father    Autoimmune disease Father        Ankylosing Spondylitis   Squamous cell carcinoma Father    Diabetes Brother    Hyperlipidemia Brother    Hypertension Brother    Thyroid  disease Brother    Alcohol abuse Brother    Heart disease Maternal Grandmother    Stroke Maternal Grandmother    Heart disease Maternal Grandfather    Autoimmune disease Paternal Grandfather     Social History   Socioeconomic History   Marital status: Married    Spouse name: Not on file   Number of children: Not on file   Years of education: Not on file   Highest education level: Doctorate  Occupational History   Not on  file  Tobacco Use   Smoking status: Former    Current packs/day: 0.00    Average packs/day: 0.3 packs/day for 10.0 years (2.5 ttl pk-yrs)    Types: Cigarettes    Start date: 04/16/2002    Quit date: 04/16/2012    Years since quitting: 12.1   Smokeless tobacco: Never  Vaping Use   Vaping status: Never Used  Substance and Sexual Activity   Alcohol use: No    Alcohol/week: 0.0 standard drinks of alcohol   Drug use: No   Sexual activity: Yes    Partners: Female  Other Topics Concern   Not on file  Social History Narrative   02/22/16   Married: Damien Like   Children: Rosa (  anxiety/depression), [name] (Autism, non-verbal), Wilbern Credit   Education: PhD in [employer]   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   Social Drivers of Health   Financial Resource Strain: Patient Declined (03/20/2024)   Overall Financial Resource Strain (CARDIA)    Difficulty of Paying Living Expenses: Patient declined  Food Insecurity: No Food Insecurity (06/16/2024)   Hunger Vital Sign    Worried About Running Out of Food in the Last Year: Never true    Ran Out of Food in the Last Year: Never true  Transportation Needs: No Transportation Needs (06/16/2024)   PRAPARE - Administrator, Civil Service (Medical): No    Lack of Transportation (Non-Medical): No  Physical Activity: Insufficiently Active (03/20/2024)   Exercise Vital Sign    Days of Exercise per Week: 2 days    Minutes of Exercise per Session: 20 min  Stress: Stress Concern Present (03/20/2024)   [employer] of Occupational Health - Occupational Stress Questionnaire    Feeling of Stress : Rather much  Social Connections: Socially Isolated (06/16/2024)   Social Connection and Isolation Panel    Frequency of Communication with Friends and Family: Three times a week    Frequency of Social Gatherings with Friends and Family: Twice a week    Attends Religious Services: Never    Database administrator or Organizations: No     Attends Banker Meetings: Never    Marital Status: Divorced     Review of Systems  Genitourinary:  Positive for hematuria.  Patient denies any headache, chest pain, shortness of breath, abdominal pain, N/V, or fever/chills. All other systems are negative.   Vital Signs: BP 135/78 (BP Location: Right Arm)   Pulse 63   Temp 97.9 F (36.6 C) (Oral)   Resp 19   Ht 6' (1.829 m)   Wt 270 lb (122.5 kg)   SpO2 99%   BMI 36.62 kg/m     Physical Exam Constitutional:      Appearance: Normal appearance.  HENT:     Mouth/Throat:     Mouth: Mucous membranes are moist.     Pharynx: Oropharynx is clear.  Cardiovascular:     Rate and Rhythm: Normal rate.  Pulmonary:     Effort: Pulmonary effort is normal.  Abdominal:     General: Abdomen is flat.     Palpations: Abdomen is soft.     Tenderness: There is no abdominal tenderness.  Skin:    General: Skin is warm and dry.  Neurological:     Mental Status: He is alert and oriented to person, place, and time.  Psychiatric:        Behavior: Behavior normal.     Imaging: US  RENAL Result Date: 06/23/2024 CLINICAL DATA:  [id number] Hematuria 358241. EXAM: RENAL / URINARY TRACT ULTRASOUND COMPLETE COMPARISON:  CT scan abdomen and pelvis from 05/22/2024. FINDINGS: Right Kidney: Renal measurements: 6.1 x 6.8 x 11.8 cm = volume: 254.6 mL. Echogenicity within normal limits. No mass or hydronephrosis visualized. Left Kidney: Renal measurements: 7.2 x 7.5 x 12.4 cm = volume: 351.0 mL. Echogenicity within normal limits. No mass or hydronephrosis visualized. There is 8-9 mm thick hypo to anechoic rim around the left kidney, likely representing perirenal hemorrhage in the settings of recent renal biopsy. Bladder: Ureteric is distended. There is an approximately 7.3 x 9.4 cm avascular heterogeneous area in the urinary bladder, highly concerning for bladder hematoma. Other: None. IMPRESSION: 1. There is an approximately 7.3 x 9.4  cm avascular  heterogeneous area in the urinary bladder, highly concerning for bladder hematoma. 2. There is 8-9 mm thick hypo to anechoic rim around the left kidney, likely representing perirenal hemorrhage in the settings of recent renal biopsy. 3. Otherwise unremarkable renal sonogram. Electronically Signed   By: Ree Molt M.D.   On: 06/23/2024 13:36   US  BIOPSY (KIDNEY) Result Date: 06/22/2024 CLINICAL DATA:  Proteinuria EXAM: ULTRASOUND GUIDED RENAL CORE BIOPSY COMPARISON:  CT 05/22/2024 TECHNIQUE: Survey ultrasound was performed and an appropriate skin entry site was localized. Site was marked, prepped with Betadine, draped in usual sterile fashion, infiltrated locally with 1% lidocaine . Intravenous Fentanyl  50mcg and Versed  2mg  were administered as conscious sedation during continuous monitoring of the patient's level of consciousness and physiological / cardiorespiratory status by the radiology RN, with a total moderate sedation time of 11 minutes. Under real time ultrasound guidance, a 17 gauge trocar needle was advanced to the margin of the lower pole of the left kidney for 3 coaxial 18 gauge core biopsy needle passes. The core samples were submitted to pathology. The patient tolerated procedure well. COMPLICATIONS: None. IMPRESSION: 1. Technically successful ultrasound-guided core renal biopsy , left lower pole. Electronically Signed   By: JONETTA Faes M.D.   On: 06/22/2024 17:02   MR SACRUM SI JOINTS WO CONTRAST Result Date: 06/16/2024 CLINICAL DATA:  Acute onset groin pain. History of ankylosing spondylitis. EXAM: MRI SACRUM WITHOUT CONTRAST TECHNIQUE: Multiplanar, multisequence MR imaging of the sacroiliac joints was performed. No intravenous contrast was administered. COMPARISON:  CT pelvis 05/22/2024 and pelvic radiographs from 06/15/2024 FINDINGS: OSSEOUS STRUCTURES AND JOINTS: Symmetric marrow edema is identified along the sacroiliac joints especially anteriorly along the sacral ala and along adjacent  portions of the iliac bones, with suspected erosions as suggested on image 14 series 2. The symmetric appearance favors sacroiliitis related to ankylosing spondylitis. Trace edema signal anterior to the SI joints on image 13 series 3 without overt SI joint effusion. Subtle endplate edema at O5-4 and L5-S1. Small hemangiomas in the L4 and L5 vertebral bodies. Marrow edema in the right ischium adjacent to tendinopathy and a suspected mild partial tear of the right hamstring tendon. MUSCULOTENDINOUS: Tendinopathy and potentially mild partial tearing of the right proximal hamstring tendon. Minimal tendinopathy of the left proximal hamstring tendon. Trace edema in the bilateral hip adductor musculature adjacent to the pubis as on image 37 series 5. SACRAL PLEXUS: No impinging lesion is identified involving the sacral plexus or proximal sciatic nerves. OTHER: No supplemental non-categorized findings. IMPRESSION: 1. Symmetric marrow edema along the sacroiliac joints with suspected erosions, favoring sacroiliitis related to ankylosing spondylitis. 2. Tendinopathy and potentially mild partial tearing of the right proximal hamstring tendon. Minimal tendinopathy of the left proximal hamstring tendon. 3. Trace edema in the bilateral hip adductor musculature adjacent to the pubis. 4. Subtle endplate edema at O5-4 and L5-S1. Electronically Signed   By: Ryan Salvage M.D.   On: 06/16/2024 08:11   MR LUMBAR SPINE WO CONTRAST Result Date: 06/15/2024 EXAM: MRI LUMBAR SPINE 06/15/2024 06:24:00 PM TECHNIQUE: Multiplanar multisequence MRI of the lumbar spine was performed without the administration of intravenous contrast. COMPARISON: 07/23/2021 CLINICAL HISTORY: Myelopathy, acute, lumbar spine. Table formatting from the original note was not included. Acute onset groin pain, history of ankylosing spondylitis, unable to walk now. FINDINGS: BONES AND ALIGNMENT: Grade 1 retrolisthesis at L5-S1. SPINAL CORD: The conus terminates  normally. SOFT TISSUES: No paraspinal mass. L1-L2: No significant disc herniation. No spinal canal stenosis or  neural foraminal narrowing. L2-L3: No significant disc herniation. No spinal canal stenosis or neural foraminal narrowing. L3-L4: No significant disc herniation. No spinal canal stenosis or neural foraminal narrowing. L4-L5: Increased size of right subarticular disc extrusion with minimal inferior migration and narrowing of the right lateral recess. L5-S1: Unchanged small central disc protrusion. No central spinal canal stenosis. Unchanged moderate bilateral neuroforaminal stenosis. IMPRESSION: 1. L4-5: Increased size of right subarticular disc extrusion with minimal inferior migration and narrowing of the right lateral recess. 2. L5-S1: Unchanged small central disc protrusion without central spinal canal stenosis. Unchanged moderate bilateral neuroforaminal stenosis. Electronically signed by: Franky Stanford MD 06/15/2024 08:20 PM EDT RP Workstation: [id number]   DG Pelvis Portable Result Date: 06/15/2024 CLINICAL DATA:  R hip pain EXAM: PORTABLE PELVIS 1-2 VIEWS COMPARISON:  May 22, 2024 FINDINGS: Unchanged appearance of the left iliac wing.No evidence of pelvic fracture or diastasis.No acute hip fracture or dislocation.There is no evidence of arthropathy or other focal bone abnormality.Soft tissues are unremarkable. IMPRESSION: No acute fracture, pelvic bone diastasis, or dislocation. Electronically Signed   By: Rogelia Myers M.D.   On: 06/15/2024 12:45   DG Chest Portable 1 View Result Date: 06/15/2024 CLINICAL DATA:  weakness EXAM: PORTABLE CHEST - 1 VIEW COMPARISON:  June 22, 2015 FINDINGS: No focal airspace consolidation, pleural effusion, or pneumothorax. No cardiomegaly. Tortuous aorta with aortic atherosclerosis. No acute fracture or destructive lesions. Multilevel thoracic osteophytosis. IMPRESSION: No acute cardiopulmonary abnormality. Electronically Signed   By: Rogelia Myers M.D.    On: 06/15/2024 12:43   CT Head Wo Contrast Result Date: 06/15/2024 EXAM: CT HEAD WITHOUT CONTRAST 06/15/2024 10:51:48 AM TECHNIQUE: CT of the head was performed without the administration of intravenous contrast. Automated exposure control, iterative reconstruction, and/or weight based adjustment of the mA/kV was utilized to reduce the radiation dose to as low as reasonably achievable. COMPARISON: CT of the head dated 05/22/2024. CLINICAL HISTORY: Stroke, follow up; recent L thalamic stroke, worse R sided weakness. Triage notes; Pt bib PTAR from home. Complains of general weakness. Pt has been progressively getting too weak to walk. Around 0100 was the last time the pt states he was able to walk. 8/10 right groin pain that radiates to lower back. Hx of ankylosing spondylitis. FINDINGS: BRAIN AND VENTRICLES: No acute hemorrhage. No evidence of acute infarct. No hydrocephalus. No extra-axial collection. No mass effect or midline shift. 2 focal lacunar infarcts present within the left thalamus are now slightly less conspicuous and are both seen on image 16 of series 3. ORBITS: No acute abnormality. SINUSES: No acute abnormality. SOFT TISSUES AND SKULL: No acute soft tissue abnormality. No skull fracture. VASCULATURE: Mild calcific atheromatous disease within the carotid siphons. IMPRESSION: 1. No acute intracranial abnormality or adverse interval change. 2. Slightly less conspicuous left thalamic lacunar infarcts compared to prior study dated 05/22/2024. Electronically signed by: Evalene Coho MD 06/15/2024 11:17 AM EDT RP Workstation: [id number]    Labs:  CBC: Recent Labs    06/18/24 0632 06/22/24 0510 06/23/24 0500 06/23/24 1735 06/24/24 0457  WBC 15.0* 13.6* 15.4*  --  15.0*  HGB 10.2* 9.2* 8.3* 9.1* 8.4*  HCT 32.5* 28.3* 25.5* 28.3* 25.8*  PLT 477* 369 351  --  338    COAGS: Recent Labs    06/15/24 1059 06/22/24 0510  INR 1.2 1.1  APTT 40*  --     BMP: Recent Labs     06/21/24 0351 06/22/24 0510 06/23/24 0500 06/24/24 0457  NA 131* 137 134* 135  K 5.2* 5.0 5.2* 5.4*  CL 104 104 103 106  CO2 17* 20* 19* 19*  GLUCOSE 227* 196* 183* 179*  BUN 125* 130* 138* 149*  CALCIUM  7.7* 7.8* 7.7* 7.4*  CREATININE 5.17* 4.89* 5.45* 6.35*  GFRNONAA 13* 13* 12* 10*    LIVER FUNCTION TESTS: Recent Labs    05/09/24 1159 05/22/24 0837 05/23/24 0130 06/15/24 1059 06/18/24 0632 06/21/24 0351 06/22/24 0510 06/23/24 0500 06/24/24 0457  BILITOT 0.6 0.5  --  0.6  --   --   --   --   --   AST 20 19  --  19  --   --   --   --   --   ALT 10 9  --  10  --   --   --   --   --   ALKPHOS 87 86  --  111  --   --   --   --   --   PROT 6.5 6.4*  --  7.3  --   --   --   --   --   ALBUMIN 2.7* 2.7*   < > 2.3*   < > 2.0* 2.1* 2.1* 2.1*   < > = values in this interval not displayed.    TUMOR MARKERS: No results for input(s): AFPTM, CEA, CA199, CHROMGRNA in the last 8760 hours.  Assessment and Plan:  Rapidly progressing CKD stage V: Kashif Pooler is a 53 y.o. male with a history of CKD stage V who recently underwent renal biopsy in IR on 9/10. Unfortunately, patient's renal status continues to worsen, now with elevated potassium. IR consulted for tunneled HD placement. Procedure to be performed under moderate sedation.  Risks and benefits discussed with the patient including, but not limited to bleeding, infection, vascular injury, pneumothorax which may require chest tube placement, air embolism or even death  All of the patient's questions were answered, patient is agreeable to proceed. Consent signed and in chart.   Thank you for allowing our service to participate in Martavis Gurney 's care.    Electronically Signed: Arvilla Salada M Erlinda Solinger, PA-C   06/24/2024, 10:51 AM     I spent a total of 20 Minutes in face to face in clinical consultation, greater than 50% of which was counseling/coordinating care for tunneled hemodialysis placement.

## 2024-06-24 NOTE — Progress Notes (Incomplete)
 Daily Progress Note Intern Pager: [phone]  Patient name: Edward [name] Medical record number: [id number] Date of birth: [birth year] Age: 53 y.o. Gender: male  Primary Care Provider: Donzetta Rollene BRAVO, MD Consultants: Nephrology, urology, IR Code Status: Full code  Pt Overview and Major Events to Date:  9/4 : Admitted for back pain  9/11 : Renal biopsy with IR  9/12: TDC placed by IR, HD initiated  Assessment and Plan: 53 year old male with PMHx ankylosing spondylitis, CKDV, CVA, HTN, T2DM and, HLD, bipolar 1 and migraines presenting with back pain in the setting of flare of ankylosing spondylitis . Patient undergone Renal Biopsy on 06/22/24 w/ IR and initiated on HD per nephrology. Assessment & Plan Ankylosing spondylitis flare Well-controlled on PO pain regimen.  D/c Norco given duplicate with oxycodone . - Continue prednisone  taper - Pain regimen: - Acetaminophen  650 mg every 8 hours scheduled - Oxycodone  5-10 mg every 4 hours as needed for moderate/severe pain - Lidocaine  patches - Tizanidine  4 mg every 8 hours as needed - Need outpatient f/u with Rheumatology  - PT/OT : CIR ESRD (end stage renal disease) on dialysis (HCC) Hyperkalemia Initiated on HD per nephrology given no sign of renal recovery.  TDC placed by IR 9/12 with initial HD session.  Renal navigator started process for OP HD chair. - Nephrology consultation, appreciate recommendations - Pending nephrology recommendation for follow-up HD session - Renal diet with 1200 ml fluid restriction  - Strict I's and O's - Electrolyte management as indicated Gross hematuria Ongoing hematuria, possibly continued upper tract bleeding per urology therefore holding further anticoagulation.  Not a candidate for TXA given recent CVA.  If worsening, consider IR embolization.  Will monitor UOP closely - Urology consult, appreciate recommendations - Monitor CBC PM and UOP - Continue Flomax  0.4 mg Daily H/O: CVA (cerebrovascular  accident) CVA 4 weeks ago, on ASA prior to hematoma/hematuria but stopped in the setting of bleeding. Given high risk will ask Neuro to weigh in. - Consult Neurology for risk stratification Chronic health problem HTN: Continue home amLODipine  10 mg daily, Carvedilol  3.125 mg BID DMII: sSSI HLD: Continue Ezetimibe  10 mg daily Bipolar 1 disorder: Continue QUEtiapine  25 mg at bedtime Depression: Continue Buspar  7.5 mg daily  Constipation: Continue MiraLAX  twice daily, senna daily   FEN/GI: Renal fluid restriction PPx: SCDs, holding anticoagulation in the setting of hematuria Dispo: Home pending hematuria improvement HD chair placement  Subjective:  Patient assessed at bedside, reports he did have some back pain that improved with p.o. pain medication.  Reports he had initial HD session yesterday that went well.  Wondering when his next follow-up session will be.  Eating well.  Has not had a bowel movement in a couple of days but denies any abdominal pain urgency to go.  Objective: Temp:  [97.8 F (36.6 C)-98.6 F (37 C)] 98.6 F (37 C) (09/12 2035) Pulse Rate:  [63-86] 78 (09/12 2035) Resp:  [11-20] 17 (09/12 2035) BP: (110-152)/(65-84) 139/73 (09/12 2035) SpO2:  [93 %-100 %] 96 % (09/12 2035) Weight:  [122.4 kg-123 kg] (P) 122.4 kg (09/12 1802) Physical Exam: General: Sitting up in bed, NAD Cardiovascular: RRR without murmur Respiratory: CTAB.  Normal work of breathing on room air Abdomen: Soft, nontender, nondistended.  Foley catheter in place draining blood-tinged urine Extremities: No peripheral edema  Laboratory: Most recent CBC Lab Results  Component Value Date   WBC 15.0 (H) 06/24/2024   HGB 8.4 (L) 06/24/2024   HCT 25.8 (L) 06/24/2024  MCV 88.1 06/24/2024   PLT 338 06/24/2024   Most recent BMP    Latest Ref Rng & Units 06/24/2024    4:57 AM  BMP  Glucose 70 - 99 mg/dL 820   BUN 6 - 20 mg/dL 850   Creatinine 9.38 - 1.24 mg/dL 3.64   Sodium 864 - 854 mmol/L  135   Potassium 3.5 - 5.1 mmol/L 5.4   Chloride 98 - 111 mmol/L 106   CO2 22 - 32 mmol/L 19   Calcium  8.9 - 10.3 mg/dL 7.4     Other pertinent labs: Glucose: 289 Hepatitis B surface antigen: Nonreactive  Imaging/Diagnostic Tests: IR TUNNELED CENTRAL VENOUS CATH [facility] W IMG Result Date: 06/24/2024 IMPRESSION: Successful placement of a right jugular tunneled dialysis catheter using ultrasound and fluoroscopic guidance. Electronically Signed   By: Juliene Balder M.D.   On: 06/24/2024 17:31     Theophilus Pagan, MD 06/24/2024, 11:25 PM  PGY-3, [facility] FPTS Intern pager: [phone], text pages welcome Secure chat group [facility] Teaching Service

## 2024-06-24 NOTE — Progress Notes (Signed)
 Requested to see pt for out-pt HD needs at d/c. Met with pt and pt's mother at bedside. Introduced self and explained role. Discussed out-pt HD options. Pt prefers to stay with Dr Melia if possible therefore prefers [facility] (pt lives in [place]). Referral submitted to [facility] admissions for review. Pt hopes to be able to drive self to HD appts but it sounds like pt's mother may be able to assist with transportation to first few appts if needed. Will assist as needed.   Randine Mungo Dialysis Navigator [phone]

## 2024-06-24 NOTE — Assessment & Plan Note (Signed)
 Ongoing hematuria, possibly continued upper tract bleeding per urology therefore holding further anticoagulation.  Not a candidate for TXA given recent CVA.  If worsening, consider IR embolization. - Urology consult, appreciate recommendations - Monitor CBC and UOP - Continue Flomax  0.4 mg Daily

## 2024-06-24 NOTE — Assessment & Plan Note (Addendum)
 Last BM 06/20/24, continue on current regimen. -Bowel regimen: MiraLax  17 g BID, senna daily

## 2024-06-24 NOTE — Sedation Documentation (Signed)
 Per pt, no allergic reaction to fentanyl . MD Henn aware.

## 2024-06-24 NOTE — Assessment & Plan Note (Addendum)
 Close to oliguric UOP, will continue to monitor but appears to be advancing p.o. intake.  He is now s/p renal biopsy on 9/10 w/ radiology. Had a large clot in bladder on ultrasound, now with CBI.  - Nephrology consultation, appreciate recommendations                  - Followed by Dr. Melia outpatient. Rapidly progressive CKD.                    - IR today for Vasc cath then starting iHD                   - In the long-term he is interested in home therapy and kidney transplant evaluation. - Renal diet with 1200 ml fluid restriction  - Strict I's and O's - Daily RFP

## 2024-06-24 NOTE — Assessment & Plan Note (Addendum)
 Symptomatically improved, feels stable on PO pain meds and agreeable to d/c IV pain meds.  Continue to mobilize and plan for acute rehab. - Continue prednisone  taper - Pain regimen: - Acetaminophen  650 mg every 8 hours scheduled - Oxycodone  5-10 mg every 4 hours as needed for moderate/severe pain - Lidocaine  patches - Tizanidine  4 mg every 8 hours as needed - Need outpatient f/u with Rheumatology  - PT/OT : recommending CRI

## 2024-06-24 NOTE — Assessment & Plan Note (Signed)
 Initiated on HD per nephrology given no sign of renal recovery.  TDC placed by IR 9/12, will initiate HD.  Renal navigator started process for OP HD chair. - Nephrology consultation, appreciate recommendations - Pending initial HD session - Renal diet with 1200 ml fluid restriction  - Strict I's and O's - Electrolyte management as indicated

## 2024-06-24 NOTE — Progress Notes (Addendum)
 Subjective: No acute events overnight.  Patient reports to be tolerating the catheter well without incidence of clot obstruction.  He was accompanied by his mother.  Case and plan was reviewed with them both.  Objective: Vital signs in last 24 hours: Temp:  [97.7 F (36.5 C)-98.2 F (36.8 C)] 97.9 F (36.6 C) (09/12 0805) Pulse Rate:  [63-66] 63 (09/12 0805) Resp:  [16-19] 19 (09/12 0805) BP: (130-152)/(73-84) 135/78 (09/12 0805) SpO2:  [99 %-100 %] 99 % (09/12 0805)  Assessment/Plan: # Gross hematuria following renal biopsy  Tolerating catheter well without progression of blood clots.  Hemoglobin drop overnight is less but hematuria itself does not appear to have improved.  Will start him on CBI with low gtt. as this is upper tract it will not be curative, but will hopefully prevent him from developing clot obstruction of urine.    Reportedly starting dialysis during this hospitalization.  Hopefully improvement in BUN will slow bleeding.  Addendum: Reviewed case with pharmacy.  Most recent CVA was actually only 4 weeks ago, this takes TXA off the table.    Reviewed with primary team/teaching service.  Would prefer SCDs over prophylactic anticoagulation until bleeding improves more.  Will defer to neurology to risk stratify staying off of blood thinners/ASA in the short-term.  If it is felt that he absolutely has to have anticoagulation, then neurologic safety will come first.  However, this bleeding cannot be temporized with cystoscopy and fulguration.  Worsening upper tract bleeding will have to resolve on its own or investigate IR embolization.  Intake/Output from previous day: 09/11 0701 - 09/12 0700 In: 150 [P.O.:120] Out: 2715 [Urine:2715]  Intake/Output this shift: Total I/O In: -  Out: 550 [Urine:550]  Physical Exam:  General: Alert and oriented CV: No cyanosis Lungs: equal chest rise Abdomen: Soft, NTND, no rebound or guarding Gu: Three-way 38f in place  draining clear cherry red urine  Lab Results: Recent Labs    06/23/24 0500 06/23/24 1735 06/24/24 0457  HGB 8.3* 9.1* 8.4*  HCT 25.5* 28.3* 25.8*   BMET Recent Labs    06/23/24 0500 06/23/24 1735 06/24/24 0457  NA 134*  --  135  K 5.2*  --  5.4*  CL 103  --  106  CO2 19*  --  19*  GLUCOSE 183*  --  179*  BUN 138*  --  149*  CREATININE 5.45*  --  6.35*  CALCIUM  7.7*  --  7.4*  HGB 8.3* 9.1* 8.4*  WBC 15.4*  --  15.0*     Studies/Results: US  RENAL Result Date: 06/23/2024 CLINICAL DATA:  [id number] Hematuria [id number]. EXAM: RENAL / URINARY TRACT ULTRASOUND COMPLETE COMPARISON:  CT scan abdomen and pelvis from 05/22/2024. FINDINGS: Right Kidney: Renal measurements: 6.1 x 6.8 x 11.8 cm = volume: 254.6 mL. Echogenicity within normal limits. No mass or hydronephrosis visualized. Left Kidney: Renal measurements: 7.2 x 7.5 x 12.4 cm = volume: 351.0 mL. Echogenicity within normal limits. No mass or hydronephrosis visualized. There is 8-9 mm thick hypo to anechoic rim around the left kidney, likely representing perirenal hemorrhage in the settings of recent renal biopsy. Bladder: Ureteric is distended. There is an approximately 7.3 x 9.4 cm avascular heterogeneous area in the urinary bladder, highly concerning for bladder hematoma. Other: None. IMPRESSION: 1. There is an approximately 7.3 x 9.4 cm avascular heterogeneous area in the urinary bladder, highly concerning for bladder hematoma. 2. There is 8-9 mm thick hypo to anechoic rim around the left  kidney, likely representing perirenal hemorrhage in the settings of recent renal biopsy. 3. Otherwise unremarkable renal sonogram. Electronically Signed   By: Ree Molt M.D.   On: 06/23/2024 13:36      LOS: 8 days   Ole Bourdon, NP [facility] Pager: [phone]  06/24/2024, 11:06 AM

## 2024-06-24 NOTE — Assessment & Plan Note (Addendum)
 Hx of CKD with mildly hyperkalemia. Received Lokelma  x 2 since admission - Daily BMP - Lokelma  today for K of 5.4

## 2024-06-24 NOTE — Progress Notes (Addendum)
 Daily Progress Note Intern Pager: [phone]  Patient name: Edward [name] Medical record number: [id number] Date of birth: [birth year] Age: 53 y.o. Gender: male  Primary Care Provider: Donzetta Rollene BRAVO, MD Consultants: Nephrology, Dietitian  Code Status: Full  Pt Overview and Major Events to Date:  9/4 : Admitted for back pain  9/11 : Renal biopsy with IR     Assessment & Plan 53 year old male with PMHx ankylosing spondylitis, CKDV, CVA, HTN, T2DM and, HLD, bipolar 1 and migraines presenting with back pain in the setting of flare of ankylosing spondylitis . Patient undergone Renal Biopsy on 06/22/24 w/ IR Assessment & Plan Ankylosing spondylitis flare Symptomatically improved, feels stable on PO pain meds and agreeable to d/c IV pain meds.  Continue to mobilize and plan for acute rehab. - Continue prednisone  taper - Pain regimen: - Acetaminophen  650 mg every 8 hours scheduled - Oxycodone  5-10 mg every 4 hours as needed for moderate/severe pain - Lidocaine  patches - Tizanidine  4 mg every 8 hours as needed - Need outpatient f/u with Rheumatology  - PT/OT : recommending CRI  CKD (chronic kidney disease), stage V (HCC) Close to oliguric UOP, will continue to monitor but appears to be advancing p.o. intake.  He is now s/p renal biopsy on 9/10 w/ radiology. Had a large clot in bladder on ultrasound, now with CBI.  - Nephrology consultation, appreciate recommendations                  - Followed by Dr. Melia outpatient. Rapidly progressive CKD.                    - IR today for Vasc cath then starting iHD                   - In the long-term he is interested in home therapy and kidney transplant evaluation. - Renal diet with 1200 ml fluid restriction  - Strict I's and O's - Daily RFP  Hyperkalemia Hx of CKD with mildly hyperkalemia. Received Lokelma  x 2 since admission - Daily BMP - Lokelma  today for K of 5.4  Constipation Last BM 06/20/24, continue on current regimen. -Bowel  regimen: MiraLax  17 g BID, senna daily Urinary retention Hematoma of bladder wall Gross hematuria and urinary retention after renal biopsy on 06/22/24. Renal ultrasound shows 7 cm clot in the bladder. 20 French three-way Foley catheter placed w/ irrigation with hydrogren peroxide on 9/11 by urology. Foley catheter is still draining dark red urine but without clot this morning w/ stable H/H - Urology consult, appreciate recommendations CBI started today  - Continue foley  - Continue Flomax  0.4 mg Daily Chronic health problem HTN : Continue home amLODipine  10 mg daily, Carrvedilol 3.125 mg BID Hx of Stroke : Will start aspirin  as soon as able- Urology recommended holding today. May need to discuss with Neurology if longer than a few days without aspirin .  DMII : SSI  HLD : Continue Ezetimibe  10 mg daily Bipolar 1 disorder : Continue QUEtiapine  25 mg at bedtime Depression : Continue Buspar  7.5 mg daily  Migraines OCD FEN/GI: NPO except meds and sip of clear, Protonix  EC 40 mg daily PPx: SQH  Dispo: Pending  Subjective:  [name] is a 53 y.o. male presenting with back pain. NAEON. Foley w/ dark bloody urine w/o clot. Has been irrigate PRN. Patient state pain has been well control   Objective: Temp:  [97.7 F (36.5 C)-98.2 F (36.8 C)]  97.9 F (36.6 C) (09/12 0805) Pulse Rate:  [63-66] 63 (09/12 0805) Resp:  [16-19] 19 (09/12 0805) BP: (130-152)/(73-84) 135/78 (09/12 0805) SpO2:  [99 %-100 %] 99 % (09/12 0805)  Physical Exam Cardiovascular:     Rate and Rhythm: Normal rate.     Pulses: Normal pulses.  Pulmonary:     Effort: Pulmonary effort is normal.     Breath sounds: Normal breath sounds.  Abdominal:     Palpations: Abdomen is soft.  GU    Foley w/ dark urine output no clot Musculoskeletal:        General: Tenderness (back) present.  Skin:    General: Skin is warm and dry.     Capillary Refill: Capillary refill takes less than 2 seconds.  Neurological:      Mental Status: He is oriented to person, place, and time. Mental status is at baseline.     Sensory: Sensory deficit (residual right sensory changes on R arm and leg,) present.  Psychiatric:        Mood and Affect: Mood normal.        Behavior: Behavior normal.   Laboratory: Most recent CBC Lab Results  Component Value Date   WBC 15.0 (H) 06/24/2024   HGB 8.4 (L) 06/24/2024   HCT 25.8 (L) 06/24/2024   MCV 88.1 06/24/2024   PLT 338 06/24/2024   Most recent BMP    Latest Ref Rng & Units 06/24/2024    4:57 AM  BMP  Glucose 70 - 99 mg/dL 820   BUN 6 - 20 mg/dL 850   Creatinine 9.38 - 1.24 mg/dL 3.64   Sodium 864 - 854 mmol/L 135   Potassium 3.5 - 5.1 mmol/L 5.4   Chloride 98 - 111 mmol/L 106   CO2 22 - 32 mmol/L 19   Calcium  8.9 - 10.3 mg/dL 7.4     Imaging/Diagnostic Tests: EXAM: MRI SACRUM WITHOUT CONTRAST IMPRESSION: 1. Symmetric marrow edema along the sacroiliac joints with suspected erosions, favoring sacroiliitis related to ankylosing spondylitis. 2. Tendinopathy and potentially mild partial tearing of the right proximal hamstring tendon. Minimal tendinopathy of the left proximal hamstring tendon. 3. Trace edema in the bilateral hip adductor musculature adjacent to the pubis. 4. Subtle endplate edema at O5-4 and L5-S1.  Suzen Houston NOVAK, DO 06/24/2024, 8:50 AM  PGY-1, [facility] FPTS Intern pager: [phone], text pages welcome Secure chat group [facility] Teaching Service

## 2024-06-24 NOTE — Assessment & Plan Note (Signed)
 HTN: Continue home amLODipine  10 mg daily, Carvedilol  3.125 mg BID DMII: sSSI HLD: Continue Ezetimibe  10 mg daily Bipolar 1 disorder: Continue QUEtiapine  25 mg at bedtime Depression: Continue Buspar  7.5 mg daily  Constipation: Continue MiraLAX  twice daily, senna daily

## 2024-06-24 NOTE — Progress Notes (Signed)
 OT Cancellation Note  Patient Details Name: Edward [name] MRN: [id number] DOB: December 02, 1970   Cancelled Treatment:    Reason Eval/Treat Not Completed: Other (comment).  Attempted skilled OT treatment session. Pt. Reports he recently returned from a procedure and is awaiting transport to HD.  Declines therapy at this time secondary to cont. Pain and also pending transport.  Will check back as able.  Pt. And family visitor verbalized understanding.    CHRISTELLA Delon Jean 06/24/2024, 2:46 PM

## 2024-06-24 NOTE — Progress Notes (Addendum)
 [facility] NEPHROLOGY PROGRESS NOTE  Assessment/ Plan: Pt is a 53 y.o. yo male   # CKD5 to new ESRD due to biopsy proven advanced diabetic glomerulosclerosis class IV, severe arterionephrosclerosis, secondary FSGS with severe interstitial fibrosis and tubular atrophy. -Rapidly progressive CKD.  UPCR almost 14 g.OP lab  including ANCA, ASO, complement level, anti dsDNA ab, Hep B, Hep C, anti-GBM, anti-PLA2R ab negative. -s/p kidney biopsy on 9/10, the result ias above, - No sign of renal recovery associated with early feature of uremia.  Discussed with the patient about starting dialysis for new ESRD.  Patient agreed with the plan.  IR consulted for [facility] placement followed by dialysis.  Renal navigator was contacted to arrange outpatient dialysis.  - In the long-term he is interested in home therapy and kidney transplant evaluation.  # Gross hematuria after kidney biopsy: Reviewed kidney ultrasound results.  Seen by urologist, Foley catheter was placed and plan to do CBI.  Monitor hemoglobin.  I will order a dose of DDAVP  to minimize bleeding.   #Ankylosing spondylitis - With flare - Per primary, on prednisone  with dose taper.   #Hyperkalemia - Renal diet - Treating with Lokelma  as needed.  Starting dialysis.  #Acidosis - Continue sodium bicarbonate , follow labs.   #History of ischemic stroke - Recent admission, was discharged on 8/13.  Status post DAPT, plan per discharge summary was to keep aspirin  on until renal biopsy.  Discussed with primary service, held aspirin  (last dose 06/17/24)   #Hypertension - BP currently acceptable on amlodipine  and carvedilol    #Anemia of CKD - Iron sat 51 %, received Aranesp  on 9/10, worsening anemia due to hematuria/acute blood loss.  Transfuse PRBC as needed.    Subjective: Seen and examined.  Event noted.  He developed gross hematuria after biopsy.  Seen by urologist.  He still has persistent hematuria therefore plan to do CBI.  He  feels tired, back pain and some nausea.  No vomiting.  No chest pain or shortness of breath.  Objective Vital signs in last 24 hours: Vitals:   06/23/24 1520 06/23/24 2043 06/24/24 0400 06/24/24 0805  BP: 130/73 (!) 143/73 (!) 152/84 135/78  Pulse: 63 63 66 63  Resp: 16 16 16 19   Temp: 97.7 F (36.5 C) 97.8 F (36.6 C) 98.2 F (36.8 C) 97.9 F (36.6 C)  TempSrc: Oral Oral Oral Oral  SpO2: 100% 99% 100% 99%  Weight:      Height:       Weight change:   Intake/Output Summary (Last 24 hours) at 06/24/2024 1151 Last data filed at 06/24/2024 1002 Gross per 24 hour  Intake 150 ml  Output 3265 ml  Net -3115 ml       Labs: RENAL PANEL Recent Labs  Lab 06/19/24 0437 06/20/24 0653 06/21/24 0351 06/22/24 0510 06/23/24 0500 06/24/24 0457  NA 132* 134* 131* 137 134* 135  K 5.3* 5.1 5.2* 5.0 5.2* 5.4*  CL 105 104 104 104 103 106  CO2 15* 18* 17* 20* 19* 19*  GLUCOSE 223* 192* 227* 196* 183* 179*  BUN 120* 118* 125* 130* 138* 149*  CREATININE 5.37* 5.12* 5.17* 4.89* 5.45* 6.35*  CALCIUM  8.0* 8.0* 7.7* 7.8* 7.7* 7.4*  PHOS 6.2* 5.6* 5.3* 4.8* 5.7*  --   ALBUMIN 1.9* 2.0* 2.0* 2.1* 2.1* 2.1*    Liver Function Tests: Recent Labs  Lab 06/22/24 0510 06/23/24 0500 06/24/24 0457  ALBUMIN 2.1* 2.1* 2.1*   No results for input(s): LIPASE, AMYLASE in the  last 168 hours. No results for input(s): AMMONIA in the last 168 hours. CBC: Recent Labs    03/21/24 1037 05/09/24 1159 06/18/24 0632 06/19/24 0437 06/22/24 0510 06/23/24 0500 06/23/24 1735 06/24/24 0457  HGB 11.3*   < > 10.2*  --  9.2* 8.3* 9.1* 8.4*  MCV 93   < > 89.3  --  86.8 88.5  --  88.1  FERRITIN 60  --   --  120  --   --   --   --   TIBC 298  --   --  209*  --   --   --   --   IRON 52  --   --  107  --   --   --   --    < > = values in this interval not displayed.    Cardiac Enzymes: No results for input(s): CKTOTAL, CKMB, CKMBINDEX, TROPONINI in the last 168 hours.  CBG: Recent Labs   Lab 06/23/24 1145 06/23/24 1616 06/23/24 2139 06/24/24 0652 06/24/24 1014  GLUCAP 217* 183* 241* 153* 111*    Iron Studies:  No results for input(s): IRON, TIBC, TRANSFERRIN, FERRITIN in the last 72 hours.  Studies/Results: US  RENAL Result Date: 06/23/2024 CLINICAL DATA:  [id number] Hematuria 358241. EXAM: RENAL / URINARY TRACT ULTRASOUND COMPLETE COMPARISON:  CT scan abdomen and pelvis from 05/22/2024. FINDINGS: Right Kidney: Renal measurements: 6.1 x 6.8 x 11.8 cm = volume: 254.6 mL. Echogenicity within normal limits. No mass or hydronephrosis visualized. Left Kidney: Renal measurements: 7.2 x 7.5 x 12.4 cm = volume: 351.0 mL. Echogenicity within normal limits. No mass or hydronephrosis visualized. There is 8-9 mm thick hypo to anechoic rim around the left kidney, likely representing perirenal hemorrhage in the settings of recent renal biopsy. Bladder: Ureteric is distended. There is an approximately 7.3 x 9.4 cm avascular heterogeneous area in the urinary bladder, highly concerning for bladder hematoma. Other: None. IMPRESSION: 1. There is an approximately 7.3 x 9.4 cm avascular heterogeneous area in the urinary bladder, highly concerning for bladder hematoma. 2. There is 8-9 mm thick hypo to anechoic rim around the left kidney, likely representing perirenal hemorrhage in the settings of recent renal biopsy. 3. Otherwise unremarkable renal sonogram. Electronically Signed   By: Ree Molt M.D.   On: 06/23/2024 13:36    Medications: Infusions:  sodium chloride  irrigation      Scheduled Medications:  acetaminophen   650 mg Oral Q8H   amLODipine   10 mg Oral Daily   atorvastatin   40 mg Oral Daily   busPIRone   7.5 mg Oral BID   carvedilol   3.125 mg Oral BID   Chlorhexidine  Gluconate Cloth  6 each Topical Q0600   Chlorhexidine  Gluconate Cloth  6 each Topical Q0600   ezetimibe   10 mg Oral Daily   feeding supplement (GLUCERNA SHAKE)  237 mL Oral TID BM   insulin  aspart  0-9 Units  Subcutaneous TID WC   lidocaine   2 patch Transdermal Q24H   pantoprazole   40 mg Oral Daily   polyethylene glycol  17 g Oral BID   [START ON 06/25/2024] predniSONE   20 mg Oral Q breakfast   Followed by   NOREEN ON 06/27/2024] predniSONE   10 mg Oral Q breakfast   QUEtiapine   25 mg Oral QHS   senna  2 tablet Oral Daily   sodium bicarbonate   1,300 mg Oral BID   sodium zirconium cyclosilicate   10 g Oral Once   tamsulosin   0.4 mg Oral Daily  have reviewed scheduled and prn medications.  Physical Exam: General:NAD, comfortable Heart:RRR, s1s2 nl Lungs:clear b/l, no crackle Abdomen: Soft, nontender.  Distended due to obesity. Extremities:No edema Neurology: Alert awake following commands GU: Foley catheter and bag has urine with blood. Fabrizzio Marcella Prasad Rai Sinagra 06/24/2024,11:51 AM  LOS: 8 days

## 2024-06-24 NOTE — Assessment & Plan Note (Addendum)
 HTN : Continue home amLODipine  10 mg daily, Carrvedilol 3.125 mg BID Hx of Stroke : Will start aspirin  as soon as able- Urology recommended holding today. May need to discuss with Neurology if longer than a few days without aspirin .  DMII : SSI  HLD : Continue Ezetimibe  10 mg daily Bipolar 1 disorder : Continue QUEtiapine  25 mg at bedtime Depression : Continue Buspar  7.5 mg daily  Migraines OCD

## 2024-06-24 NOTE — Assessment & Plan Note (Signed)
 Gross hematuria and urinary retention after renal biopsy on 06/22/24. Renal ultrasound shows 7 cm clot in the bladder. 20 French three-way Foley catheter placed w/ irrigation with hydrogren peroxide on 9/11 by urology. Foley catheter is still draining dark red urine but without clot this morning w/ stable H/H - Urology consult, appreciate recommendations CBI started today  - Continue foley  - Continue Flomax  0.4 mg Daily

## 2024-06-24 NOTE — Assessment & Plan Note (Signed)
 Well-controlled on PO pain regimen.  D/c Norco given duplicate with oxycodone . - Continue prednisone  taper - Pain regimen: - Acetaminophen  650 mg every 8 hours scheduled - Oxycodone  5-10 mg every 4 hours as needed for moderate/severe pain - Lidocaine  patches - Tizanidine  4 mg every 8 hours as needed - Need outpatient f/u with Rheumatology  - PT/OT : recommending CRI

## 2024-06-24 NOTE — Progress Notes (Addendum)
   06/24/24 1802  Vitals  Temp 98.2 F (36.8 C)  Pulse Rate 83  Resp 16  BP 134/65  SpO2 97 %  O2 Device Room Air  Weight 122.4 kg (Bed Scale)  Type of Weight Post-Dialysis  Oxygen Therapy  Pulse Oximetry Type Continuous  Post Treatment  Dialyzer Clearance Clear  Hemodialysis Intake (mL) 0 mL  Liters Processed 24  Fluid Removed (mL) 500 mL  Tolerated HD Treatment Yes  Post-Hemodialysis Comments Tx. completed without difficulties. Report call to 5N bedside Nurse   Received patient in bed to unit.  Alert and oriented.  Informed consent signed and in chart.   TX duration: 2  Patient tolerated well.  Transported back to the room  Alert, without acute distress.  Hand-off given to patient's nurse.   Access used: Yes Access issues: No  Total UF removed: 500 Medication(s) given: See MAR Post HD VS: See Above Grid Post HD weight: 122.4 kg   [name]

## 2024-06-24 NOTE — Procedures (Signed)
 Interventional Radiology Procedure:   Indications: ESRD  Procedure: Placement of tunneled dialysis catheter  Findings: Right jugular Palindrome, 23 cm, tip at SVC/RA junction  Complications: None     EBL: Minimal  Plan: Catheter is ready to use   Kem Parcher R. Philip, MD  Pager: [phone]

## 2024-06-24 NOTE — Plan of Care (Signed)
   Discussed restarting SQH and ASA 81 mg with urology team - Ole Bourdon, NP. Per urology, patient is likely still with some upper tract bleeding and prefer to hold off on restart SQH and ASA. With his PMH of recent stroke and high risk for DVT, we will plan on restart SQH tmrw if his H/H remain stable.

## 2024-06-24 NOTE — Assessment & Plan Note (Deleted)
 Gross hematuria and urinary retention after renal biopsy on 06/22/24. Renal ultrasound shows 7 cm clot in the bladder. 20 French three-way Foley catheter placed w/ irrigation with hydrogren peroxide on 9/11 by urology. Foley catheter is still draining dark red urine but without clot this morning w/ stable H/H - Urology consult, appreciate recommendations Hand irrigate as needed. TOV in next couple of days if hematuria clears NPO.  If there is more clot material that is unable to be mobilized through the catheter, will take patient to the OR for EVAC and fulguration. - Continue foley  - Continue Flomax  0.4 mg Daily

## 2024-06-24 NOTE — Progress Notes (Signed)
 PT Cancellation Note  Patient Details Name: Edward [name] MRN: [id number] DOB: Mar 08, 1971   Cancelled Treatment:    Reason Eval/Treat Not Completed: (P) Pain limiting ability to participate;Patient at procedure or test/unavailable, pt declining mobility on initial attempt due to pain despite pre-medication (10:15) asking this PTA to return in 1-2 hours, on return at 11:52 pt off unit to IR for [facility] and possible HD after. Will check back as schedule allows to continue with PT POC.  Therisa SAUNDERS. PTA Acute Rehabilitation Services Office: [phone]     Therisa CHRISTELLA Boor 06/24/2024, 12:22 PM

## 2024-06-25 ENCOUNTER — Encounter (HOSPITAL_COMMUNITY): Payer: Self-pay | Admitting: Family Medicine

## 2024-06-25 ENCOUNTER — Encounter: Payer: Self-pay | Admitting: Family Medicine

## 2024-06-25 DIAGNOSIS — Z8673 Personal history of transient ischemic attack (TIA), and cerebral infarction without residual deficits: Secondary | ICD-10-CM

## 2024-06-25 DIAGNOSIS — Z992 Dependence on renal dialysis: Secondary | ICD-10-CM | POA: Diagnosis not present

## 2024-06-25 DIAGNOSIS — R310 Gross hematuria: Secondary | ICD-10-CM | POA: Diagnosis not present

## 2024-06-25 DIAGNOSIS — N186 End stage renal disease: Secondary | ICD-10-CM | POA: Diagnosis not present

## 2024-06-25 LAB — CBC
HCT: 24.2 % — ABNORMAL LOW (ref 39.0–52.0)
HCT: 25.6 % — ABNORMAL LOW (ref 39.0–52.0)
HCT: 27.4 % — ABNORMAL LOW (ref 39.0–52.0)
Hemoglobin: 7.8 g/dL — ABNORMAL LOW (ref 13.0–17.0)
Hemoglobin: 8.3 g/dL — ABNORMAL LOW (ref 13.0–17.0)
Hemoglobin: 8.8 g/dL — ABNORMAL LOW (ref 13.0–17.0)
MCH: 28.3 pg (ref 26.0–34.0)
MCH: 28.4 pg (ref 26.0–34.0)
MCH: 28.3 pg (ref 26.0–34.0)
MCHC: 32.2 g/dL (ref 30.0–36.0)
MCHC: 32.4 g/dL (ref 30.0–36.0)
MCHC: 32.1 g/dL (ref 30.0–36.0)
MCV: 87.7 fL (ref 80.0–100.0)
MCV: 87.7 fL (ref 80.0–100.0)
MCV: 88.1 fL (ref 80.0–100.0)
Platelets: 297 K/uL (ref 150–400)
Platelets: 317 K/uL (ref 150–400)
Platelets: 350 K/uL (ref 150–400)
RBC: 2.76 MIL/uL — ABNORMAL LOW (ref 4.22–5.81)
RBC: 2.92 MIL/uL — ABNORMAL LOW (ref 4.22–5.81)
RBC: 3.11 MIL/uL — ABNORMAL LOW (ref 4.22–5.81)
RDW: 15.7 % — ABNORMAL HIGH (ref 11.5–15.5)
RDW: 15.6 % — ABNORMAL HIGH (ref 11.5–15.5)
RDW: 15.7 % — ABNORMAL HIGH (ref 11.5–15.5)
WBC: 11.4 K/uL — ABNORMAL HIGH (ref 4.0–10.5)
WBC: 12 K/uL — ABNORMAL HIGH (ref 4.0–10.5)
WBC: 13.2 K/uL — ABNORMAL HIGH (ref 4.0–10.5)
nRBC: 0 % (ref 0.0–0.2)
nRBC: 0 % (ref 0.0–0.2)
nRBC: 0.2 % (ref 0.0–0.2)

## 2024-06-25 LAB — BASIC METABOLIC PANEL WITH GFR
Anion gap: 13 (ref 5–15)
BUN: 113 mg/dL — ABNORMAL HIGH (ref 6–20)
CO2: 20 mmol/L — ABNORMAL LOW (ref 22–32)
Calcium: 7.4 mg/dL — ABNORMAL LOW (ref 8.9–10.3)
Chloride: 102 mmol/L (ref 98–111)
Creatinine, Ser: 5.9 mg/dL — ABNORMAL HIGH (ref 0.61–1.24)
GFR, Estimated: 11 mL/min — ABNORMAL LOW (ref >60)
Glucose, Bld: 260 mg/dL — ABNORMAL HIGH (ref 70–99)
Potassium: 5 mmol/L (ref 3.5–5.1)
Sodium: 135 mmol/L (ref 135–145)

## 2024-06-25 LAB — HEPATITIS B SURFACE ANTIBODY, QUANTITATIVE: Hep B S AB Quant (Post): 5.4 m[IU]/mL — ABNORMAL LOW

## 2024-06-25 LAB — GLUCOSE, CAPILLARY
Glucose-Capillary: 206 mg/dL — ABNORMAL HIGH (ref 70–99)
Glucose-Capillary: 314 mg/dL — ABNORMAL HIGH (ref 70–99)
Glucose-Capillary: 333 mg/dL — ABNORMAL HIGH (ref 70–99)

## 2024-06-25 MED ORDER — HEPARIN SODIUM (PORCINE) 1000 UNIT/ML IJ SOLN
INTRAMUSCULAR | Status: AC
  Filled 2024-06-25: qty 4

## 2024-06-25 MED ORDER — LIDOCAINE-PRILOCAINE 2.5-2.5 % EX CREA: 1.0000 | TOPICAL_CREAM | CUTANEOUS | Status: DC | PRN

## 2024-06-25 MED ORDER — INSULIN GLARGINE 100 UNIT/ML ~~LOC~~ SOLN
5.0000 [IU] | Freq: Every day | SUBCUTANEOUS | Status: DC
  Administered 2024-06-25 – 2024-06-26 (×2): 5 [IU] via SUBCUTANEOUS
  Filled 2024-06-25 (×2): qty 0.05

## 2024-06-25 MED ORDER — HEPARIN SODIUM (PORCINE) 1000 UNIT/ML DIALYSIS
1000.0000 [IU] | INTRAMUSCULAR | Status: DC | PRN
  Administered 2024-06-26: 4000 [IU]
  Filled 2024-06-25: qty 1

## 2024-06-25 MED ORDER — LIDOCAINE HCL (PF) 1 % IJ SOLN: 5.0000 mL | INTRAMUSCULAR | Status: DC | PRN

## 2024-06-25 MED ORDER — ANTICOAGULANT SODIUM CITRATE 4% (200MG/5ML) IV SOLN: 5.0000 mL | Status: DC | PRN

## 2024-06-25 MED ORDER — CHLORHEXIDINE GLUCONATE CLOTH 2 % EX PADS
6.0000 | MEDICATED_PAD | Freq: Every day | CUTANEOUS | Status: DC
  Administered 2024-06-25 – 2024-06-29 (×5): 6 via TOPICAL

## 2024-06-25 MED ORDER — ALTEPLASE 2 MG IJ SOLR: 2.0000 mg | Freq: Once | INTRAMUSCULAR | Status: DC | PRN

## 2024-06-25 MED ORDER — PENTAFLUOROPROP-TETRAFLUOROETH EX AERO: 1.0000 | INHALATION_SPRAY | CUTANEOUS | Status: DC | PRN

## 2024-06-25 NOTE — Plan of Care (Signed)
 FMTS Interim Progress Note  Secure chat message to Dr. Vanessa regarding restarting aspirin  in the setting of ongoing gross hematuria.  He recommended holding aspirin  and restarting in the future when more clinically stable.  Neurology will not consult or see patient.  Theophilus Pagan, MD 06/25/2024, 7:36 AM PGY-3, [facility] Family Medicine Service pager [phone]

## 2024-06-25 NOTE — Progress Notes (Signed)
   Subjective No complaints   Physical Exam: BP 125/71 (BP Location: Right Arm)   Pulse 64   Temp 97.7 F (36.5 C) (Oral)   Resp 17   Ht 6' (1.829 m)   Wt (P) 122.4 kg Comment: Bed Scale  SpO2 96%   BMI (P) 36.60 kg/m    Intake/Output from previous day: 09/12 0701 - 09/13 0700 In: 3320.3 [P.O.:180; IV Piggyback:140.3] Out: 89524 [Urine:9975] Intake/Output this shift: Total I/O In: -  Out: 2200 [Urine:2200]  Constitutional:  Alert, No acute distress. Respiratory: Normal respiratory effort, no increased work of breathing. GU: CBI was off and urine to be medium brown/translucent.  Low flow CBI started and effluent lightish brown   Lab Results:  Recent Labs    06/24/24 0457 06/25/24 0425  WBC 15.0* 11.4*  HGB 8.4* 7.8*  HCT 25.8* 24.2*  PLT 338 297   BMET Recent Labs    06/24/24 0457 06/25/24 0425  NA 135 135  K 5.4* 5.0  CL 106 102  CO2 19* 20*  GLUCOSE 179* 260*  BUN 149* 113*  CREATININE 6.35* 5.90*  CALCIUM  7.4* 7.4*    Assessment & Plan:    1.  Gross hematuria following renal biopsy Stable H/H Appearance of urine more in line with old blood and not active bleeding Continue low flow CBI for another 24 hours   Glendia JAYSON Barba, MD

## 2024-06-25 NOTE — Progress Notes (Signed)
 Mobility Specialist Progress Note:    06/25/24 1619  Mobility  Activity Ambulated with assistance  Level of Assistance Contact guard assist, steadying assist  Assistive Device Front wheel walker  Distance Ambulated (ft) 150 ft  Activity Response Tolerated well  Mobility Referral Yes  Mobility visit 1 Mobility  Mobility Specialist Start Time (ACUTE ONLY) 1455  Mobility Specialist Stop Time (ACUTE ONLY) 1510  Mobility Specialist Time Calculation (min) (ACUTE ONLY) 15 min   Received pt in bed having no complaints and agreeable to mobility. Pt was asymptomatic throughout ambulation and returned to room w/o fault. Left in chair w/ call bell in reach and all needs met.   Thersia Minder Mobility Specialist  Please contact vis Secure Chat or  Rehab Office [phone]

## 2024-06-25 NOTE — Progress Notes (Signed)
 [facility] NEPHROLOGY PROGRESS NOTE  Assessment/ Plan: Pt is a 53 y.o. yo male   # CKD5 to new ESRD due to biopsy proven advanced diabetic glomerulosclerosis class IV, severe arterionephrosclerosis, secondary FSGS with severe interstitial fibrosis and tubular atrophy. -Rapidly progressive CKD.  UPCR almost 14 g.OP lab  including ANCA, ASO, complement level, anti dsDNA ab, Hep B, Hep C, anti-GBM, anti-PLA2R ab negative. -s/p kidney biopsy on 9/10, the result ias above, - No sign of renal recovery associated with early feature of uremia. -S/P RIJ TDC followed by first HD on 9/12. - Tolerated first dialysis well, plan for second HD today. -Renal navigator is already following to arrange outpatient dialysis.  - In the long-term he is interested in home therapy and kidney transplant evaluation.  # Gross hematuria after kidney biopsy: Reviewed kidney ultrasound results.  Seen by urologist, Foley catheter was placed and on CBI.     #Ankylosing spondylitis - With flare - Per primary, on prednisone  with dose taper.   #Hyperkalemia - Renal diet - Treating with Lokelma  as needed.  Starting dialysis.  #Acidosis - Continue sodium bicarbonate , follow labs.   #History of ischemic stroke - Recent admission, was discharged on 8/13.  Status post DAPT, plan per discharge summary was to keep aspirin  on until renal biopsy.  Discussed with primary service, held aspirin  (last dose 06/17/24)   #Hypertension - BP currently acceptable on amlodipine  and carvedilol    #Anemia of CKD - Iron sat 51 %, received Aranesp  on 9/10, worsening anemia due to hematuria/acute blood loss.  Transfuse PRBC as needed.    Subjective: Seen and examined.  Tolerated first HD well.  Urine is getting clear.  Feels weak and tired.  No nausea or vomiting.  No other new event.  Objective Vital signs in last 24 hours: Vitals:   06/24/24 1802 06/24/24 2035 06/25/24 0452 06/25/24 1139  BP: 134/65 139/73 136/72  125/71  Pulse: 83 78 (!) 57 64  Resp: 16 17 16 17   Temp: 98.2 F (36.8 C) 98.6 F (37 C)  97.7 F (36.5 C)  TempSrc:  Oral  Oral  SpO2: 97% 96% 98% 96%  Weight: (P) 122.4 kg     Height:       Weight change:   Intake/Output Summary (Last 24 hours) at 06/25/2024 1200 Last data filed at 06/25/2024 0900 Gross per 24 hour  Intake 3320.29 ml  Output 87874 ml  Net -8804.71 ml       Labs: RENAL PANEL Recent Labs  Lab 06/19/24 0437 06/20/24 0653 06/21/24 0351 06/22/24 0510 06/23/24 0500 06/24/24 0457 06/25/24 0425  NA 132* 134* 131* 137 134* 135 135  K 5.3* 5.1 5.2* 5.0 5.2* 5.4* 5.0  CL 105 104 104 104 103 106 102  CO2 15* 18* 17* 20* 19* 19* 20*  GLUCOSE 223* 192* 227* 196* 183* 179* 260*  BUN 120* 118* 125* 130* 138* 149* 113*  CREATININE 5.37* 5.12* 5.17* 4.89* 5.45* 6.35* 5.90*  CALCIUM  8.0* 8.0* 7.7* 7.8* 7.7* 7.4* 7.4*  PHOS 6.2* 5.6* 5.3* 4.8* 5.7*  --   --   ALBUMIN 1.9* 2.0* 2.0* 2.1* 2.1* 2.1*  --     Liver Function Tests: Recent Labs  Lab 06/22/24 0510 06/23/24 0500 06/24/24 0457  ALBUMIN 2.1* 2.1* 2.1*   No results for input(s): LIPASE, AMYLASE in the last 168 hours. No results for input(s): AMMONIA in the last 168 hours. CBC: Recent Labs    03/21/24 1037 05/09/24 1159 06/19/24 0437 06/22/24 0510  06/23/24 0500 06/23/24 1735 06/24/24 0457 06/25/24 0425  HGB 11.3*   < >  --  9.2* 8.3* 9.1* 8.4* 7.8*  MCV 93   < >  --  86.8 88.5  --  88.1 87.7  FERRITIN 60  --  120  --   --   --   --   --   TIBC 298  --  209*  --   --   --   --   --   IRON 52  --  107  --   --   --   --   --    < > = values in this interval not displayed.    Cardiac Enzymes: No results for input(s): CKTOTAL, CKMB, CKMBINDEX, TROPONINI in the last 168 hours.  CBG: Recent Labs  Lab 06/24/24 1502 06/24/24 1840 06/24/24 2110 06/25/24 0638 06/25/24 1149  GLUCAP 236* 186* 289* 206* 314*    Iron Studies:  No results for input(s): IRON, TIBC,  TRANSFERRIN, FERRITIN in the last 72 hours.  Studies/Results: IR TUNNELED CENTRAL VENOUS CATH [facility] W IMG Result Date: 06/24/2024 INDICATION: End-stage renal disease and needs hemodialysis. EXAM: FLUOROSCOPIC AND ULTRASOUND GUIDED PLACEMENT OF A TUNNELED DIALYSIS CATHETER Physician: Juliene SAUNDERS. Philip, MD MEDICATIONS: Ancef  2 g; The antibiotic was administered within an appropriate time interval prior to skin puncture. ANESTHESIA/SEDATION: Moderate (conscious) sedation was employed during this procedure. A total of Versed  2 mg and fentanyl  100 mcg and Dilaudid  0.5 mg was administered intravenously at the order of the provider performing the procedure. Total intra-service moderate sedation time: 21 minutes. Patient's level of consciousness and vital signs were monitored continuously by radiology nurse throughout the procedure under the supervision of the provider performing the procedure. FLUOROSCOPY TIME:  Radiation Exposure Index (as provided by the fluoroscopic device): 4 mGy Kerma COMPLICATIONS: None immediate. PROCEDURE: Informed consent was obtained for placement of a tunneled dialysis catheter. The patient was placed supine on the interventional table. Ultrasound confirmed a patent right internal jugular vein. Ultrasound image obtained for documentation. The right neck and chest was prepped and draped in a sterile fashion. Maximal barrier sterile technique was utilized including caps, mask, sterile gowns, sterile gloves, sterile drape, hand hygiene and skin antiseptic. The right neck was anesthetized with 1% lidocaine . A small incision was made with #11 blade scalpel. A 21 gauge needle directed into the right internal jugular vein with ultrasound guidance. A micropuncture dilator set was placed. A 23 cm tip to cuff Palindrome catheter was selected. The skin below the right clavicle was anesthetized and a small incision was made with an #11 blade scalpel. A subcutaneous tunnel was formed to the vein dermatotomy  site. The catheter was brought through the tunnel. The vein dermatotomy site was dilated to accommodate a peel-away sheath over a wire. The catheter was placed through the peel-away sheath and directed into the central venous structures. The tip of the catheter was placed at superior cavoatrial junction with fluoroscopy. Fluoroscopic images were obtained for documentation. Both lumens were found to aspirate and flush well. The proper amount of heparin  was flushed in both lumens. The vein dermatotomy site was closed using a single layer of absorbable suture and Dermabond. Gel-Foam placed in the subcutaneous tract. The catheter was secured to the skin using Prolene suture. IMPRESSION: Successful placement of a right jugular tunneled dialysis catheter using ultrasound and fluoroscopic guidance. Electronically Signed   By: Juliene Philip M.D.   On: 06/24/2024 17:31   US  RENAL Result Date:  06/23/2024 CLINICAL DATA:  [id number] Hematuria [id number]. EXAM: RENAL / URINARY TRACT ULTRASOUND COMPLETE COMPARISON:  CT scan abdomen and pelvis from 05/22/2024. FINDINGS: Right Kidney: Renal measurements: 6.1 x 6.8 x 11.8 cm = volume: 254.6 mL. Echogenicity within normal limits. No mass or hydronephrosis visualized. Left Kidney: Renal measurements: 7.2 x 7.5 x 12.4 cm = volume: 351.0 mL. Echogenicity within normal limits. No mass or hydronephrosis visualized. There is 8-9 mm thick hypo to anechoic rim around the left kidney, likely representing perirenal hemorrhage in the settings of recent renal biopsy. Bladder: Ureteric is distended. There is an approximately 7.3 x 9.4 cm avascular heterogeneous area in the urinary bladder, highly concerning for bladder hematoma. Other: None. IMPRESSION: 1. There is an approximately 7.3 x 9.4 cm avascular heterogeneous area in the urinary bladder, highly concerning for bladder hematoma. 2. There is 8-9 mm thick hypo to anechoic rim around the left kidney, likely representing perirenal hemorrhage in the  settings of recent renal biopsy. 3. Otherwise unremarkable renal sonogram. Electronically Signed   By: Ree Molt M.D.   On: 06/23/2024 13:36    Medications: Infusions:  sodium chloride  irrigation      Scheduled Medications:  acetaminophen   650 mg Oral Q8H   amLODipine   10 mg Oral Daily   atorvastatin   40 mg Oral Daily   busPIRone   7.5 mg Oral BID   carvedilol   3.125 mg Oral BID   Chlorhexidine  Gluconate Cloth  6 each Topical Q0600   Chlorhexidine  Gluconate Cloth  6 each Topical Q0600   Chlorhexidine  Gluconate Cloth  6 each Topical Q0600   ezetimibe   10 mg Oral Daily   feeding supplement (GLUCERNA SHAKE)  237 mL Oral TID BM   insulin  aspart  0-9 Units Subcutaneous TID WC   lidocaine   2 patch Transdermal Q24H   pantoprazole   40 mg Oral Daily   polyethylene glycol  17 g Oral BID   predniSONE   20 mg Oral Q breakfast   Followed by   NOREEN ON 06/27/2024] predniSONE   10 mg Oral Q breakfast   QUEtiapine   25 mg Oral QHS   senna  2 tablet Oral Daily   sodium bicarbonate   1,300 mg Oral BID   tamsulosin   0.4 mg Oral Daily    have reviewed scheduled and prn medications.  Physical Exam: General:NAD, comfortable Heart:RRR, s1s2 nl Lungs:clear b/l, no crackle Abdomen: Soft, nontender.  Distended due to obesity. Extremities:No edema Neurology: Alert awake following commands GU: Urine is less dark and more clearer than yesterday. Vascular HD access; RIJ TDC  Shamiyah Ngu Amelie Romney 06/25/2024,12:00 PM  LOS: 9 days

## 2024-06-25 NOTE — Assessment & Plan Note (Signed)
 CVA 4 weeks ago, on ASA prior to hematoma/hematuria but stopped in the setting of bleeding. Given high risk will ask Neuro to weigh in. - Consult Neurology for risk stratification

## 2024-06-26 DIAGNOSIS — M450 Ankylosing spondylitis of multiple sites in spine: Secondary | ICD-10-CM

## 2024-06-26 DIAGNOSIS — Z992 Dependence on renal dialysis: Secondary | ICD-10-CM | POA: Diagnosis not present

## 2024-06-26 DIAGNOSIS — R310 Gross hematuria: Secondary | ICD-10-CM | POA: Diagnosis not present

## 2024-06-26 DIAGNOSIS — N186 End stage renal disease: Secondary | ICD-10-CM | POA: Diagnosis not present

## 2024-06-26 LAB — RENAL FUNCTION PANEL
Albumin: 2 g/dL — ABNORMAL LOW (ref 3.5–5.0)
Anion gap: 10 (ref 5–15)
BUN: 72 mg/dL — ABNORMAL HIGH (ref 6–20)
CO2: 24 mmol/L (ref 22–32)
Calcium: 7.4 mg/dL — ABNORMAL LOW (ref 8.9–10.3)
Chloride: 100 mmol/L (ref 98–111)
Creatinine, Ser: 4.27 mg/dL — ABNORMAL HIGH (ref 0.61–1.24)
GFR, Estimated: 16 mL/min — ABNORMAL LOW (ref >60)
Glucose, Bld: 252 mg/dL — ABNORMAL HIGH (ref 70–99)
Phosphorus: 3.3 mg/dL (ref 2.5–4.6)
Potassium: 3.8 mmol/L (ref 3.5–5.1)
Sodium: 134 mmol/L — ABNORMAL LOW (ref 135–145)

## 2024-06-26 LAB — CBC
HCT: 23.1 % — ABNORMAL LOW (ref 39.0–52.0)
HCT: 25.2 % — ABNORMAL LOW (ref 39.0–52.0)
Hemoglobin: 7.5 g/dL — ABNORMAL LOW (ref 13.0–17.0)
Hemoglobin: 8.1 g/dL — ABNORMAL LOW (ref 13.0–17.0)
MCH: 28.3 pg (ref 26.0–34.0)
MCH: 28.5 pg (ref 26.0–34.0)
MCHC: 32.5 g/dL (ref 30.0–36.0)
MCHC: 32.1 g/dL (ref 30.0–36.0)
MCV: 87.2 fL (ref 80.0–100.0)
MCV: 88.7 fL (ref 80.0–100.0)
Platelets: 294 K/uL (ref 150–400)
Platelets: 298 K/uL (ref 150–400)
RBC: 2.65 MIL/uL — ABNORMAL LOW (ref 4.22–5.81)
RBC: 2.84 MIL/uL — ABNORMAL LOW (ref 4.22–5.81)
RDW: 15.6 % — ABNORMAL HIGH (ref 11.5–15.5)
RDW: 15.5 % (ref 11.5–15.5)
WBC: 12.7 K/uL — ABNORMAL HIGH (ref 4.0–10.5)
WBC: 12.7 K/uL — ABNORMAL HIGH (ref 4.0–10.5)
nRBC: 0.2 % (ref 0.0–0.2)
nRBC: 0 % (ref 0.0–0.2)

## 2024-06-26 LAB — GLUCOSE, CAPILLARY
Glucose-Capillary: 228 mg/dL — ABNORMAL HIGH (ref 70–99)
Glucose-Capillary: 143 mg/dL — ABNORMAL HIGH (ref 70–99)
Glucose-Capillary: 320 mg/dL — ABNORMAL HIGH (ref 70–99)
Glucose-Capillary: 261 mg/dL — ABNORMAL HIGH (ref 70–99)

## 2024-06-26 MED ORDER — FERROUS SULFATE 325 (65 FE) MG PO TABS
325.0000 mg | ORAL_TABLET | ORAL | Status: DC
  Administered 2024-06-27 – 2024-07-01 (×2): 325 mg via ORAL
  Filled 2024-06-26 (×2): qty 1

## 2024-06-26 MED ORDER — INSULIN ASPART 100 UNIT/ML IJ SOLN
5.0000 [IU] | Freq: Once | INTRAMUSCULAR | Status: AC
  Administered 2024-06-26: 5 [IU] via SUBCUTANEOUS

## 2024-06-26 MED ORDER — SODIUM CHLORIDE 0.9 % IV SOLN
20.0000 ug | Freq: Once | INTRAVENOUS | Status: AC
  Administered 2024-06-26: 20 ug via INTRAVENOUS
  Filled 2024-06-26: qty 5

## 2024-06-26 MED ORDER — CHLORHEXIDINE GLUCONATE CLOTH 2 % EX PADS
6.0000 | MEDICATED_PAD | Freq: Every day | CUTANEOUS | Status: DC
  Administered 2024-06-26: 6 via TOPICAL

## 2024-06-26 MED ORDER — INSULIN GLARGINE 100 UNIT/ML ~~LOC~~ SOLN
3.0000 [IU] | Freq: Once | SUBCUTANEOUS | Status: AC
  Administered 2024-06-26: 3 [IU] via SUBCUTANEOUS
  Filled 2024-06-26: qty 0.03

## 2024-06-26 MED ORDER — INSULIN GLARGINE 100 UNIT/ML ~~LOC~~ SOLN
8.0000 [IU] | Freq: Every day | SUBCUTANEOUS | Status: DC
  Administered 2024-06-27 – 2024-06-28 (×2): 8 [IU] via SUBCUTANEOUS
  Filled 2024-06-26 (×3): qty 0.08

## 2024-06-26 NOTE — Assessment & Plan Note (Addendum)
 HTN: Continue home amLODipine  10 mg daily, Carvedilol  3.125 mg BID DMII: sSSI, Glargine 5 units started 9/13  HLD: Continue Ezetimibe  10 mg daily Bipolar 1 disorder: Continue QUEtiapine  25 mg at bedtime Depression: Continue Buspar  7.5 mg daily  Constipation: Continue MiraLAX  twice daily, senna daily

## 2024-06-26 NOTE — Assessment & Plan Note (Addendum)
 CVA 4 weeks ago, on ASA prior to admission. ASA then was stopped for ASA washout pre-kidney biopsy (9/5 - 9/10) and continue to hold due  to hematoma/hematuria  - Neurology weighed in on 9/13- hold aspirin  until bleeding resolved  - Continue to hold ASA and SQH  - Continue statin, ezetimibe 

## 2024-06-26 NOTE — Assessment & Plan Note (Addendum)
 Well-controlled on PO pain regimen. - Continue prednisone  taper - Pain regimen: - Acetaminophen  650 mg every 8 hours scheduled - Oxycodone  5-10 mg every 4 hours as needed for moderate/severe pain - Lidocaine  patches - Tizanidine  4 mg every 8 hours as needed - Need outpatient f/u with Rheumatology  - PT/OT : CIR

## 2024-06-26 NOTE — Progress Notes (Signed)
 [facility] NEPHROLOGY PROGRESS NOTE  Assessment/ Plan: Pt is a 53 y.o. yo male   # CKD5 to new ESRD due to biopsy proven advanced diabetic glomerulosclerosis class IV, severe arterionephrosclerosis, secondary FSGS with severe interstitial fibrosis and tubular atrophy. -Rapidly progressive CKD.  UPCR almost 14 g.OP lab  including ANCA, ASO, complement level, anti dsDNA ab, Hep B, Hep C, anti-GBM, anti-PLA2R ab negative. -s/p kidney biopsy on 9/10, the result ias above, - No sign of renal recovery associated with early feature of uremia. -S/P RIJ TDC followed by first HD on 9/12. - Second HD on 9/13, UF 1 L and tolerated well.  Plan for next dialysis tomorrow. -Renal navigator is already following to arrange outpatient dialysis.  - In the long-term he is interested in home therapy and kidney transplant evaluation.  # Bleeding from HD catheter site requiring prolonged pressure by the bedside nurse.  When I examined the bleeding was stopped.  If bleeding recurs then recommend to contact IR team.  Discussed with nursing staff.  # Gross hematuria after kidney biopsy: Reviewed kidney ultrasound results.  Seen by urologist, Foley catheter was placed and on CBI.  Hematuria seems to be clearing up.  #Anemia of CKD and acute blood loss anemia due to hematuria. - Iron sat 51 %, received Aranesp  on 9/10, worsening anemia due to hematuria/acute blood loss.  Transfuse PRBC as needed.   #Ankylosing spondylitis - With flare - Per primary, on prednisone  with dose taper.   #Hyperkalemia - Renal diet - Treated with Lokelma  and but now on dialysis.  #Acidosis - Discontinue sodium bicarbonate , now HD.   #Hypertension - BP currently acceptable on amlodipine  and carvedilol .   Subjective: Seen and examined.  Nursing staff were applying pressure to the catheter site.  Mother at the bedside.  Feels weak but denies nausea, vomiting, chest pain or shortness of breath.  Objective Vital signs  in last 24 hours: Vitals:   06/26/24 0500 06/26/24 0846 06/26/24 0913 06/26/24 0916  BP: 128/69 (!) 142/80 (!) 149/76 (!) 149/76  Pulse: (!) 56 62  67  Resp: 17 16    Temp: 98.4 F (36.9 C) 98 F (36.7 C)    TempSrc:      SpO2: 98% 96%    Weight:      Height:       Weight change:   Intake/Output Summary (Last 24 hours) at 06/26/2024 1054 Last data filed at 06/26/2024 0700 Gross per 24 hour  Intake 3000 ml  Output 7075 ml  Net -4075 ml       Labs: RENAL PANEL Recent Labs  Lab 06/20/24 0653 06/21/24 0351 06/22/24 0510 06/23/24 0500 06/24/24 0457 06/25/24 0425 06/26/24 0312  NA 134* 131* 137 134* 135 135 134*  K 5.1 5.2* 5.0 5.2* 5.4* 5.0 3.8  CL 104 104 104 103 106 102 100  CO2 18* 17* 20* 19* 19* 20* 24  GLUCOSE 192* 227* 196* 183* 179* 260* 252*  BUN 118* 125* 130* 138* 149* 113* 72*  CREATININE 5.12* 5.17* 4.89* 5.45* 6.35* 5.90* 4.27*  CALCIUM  8.0* 7.7* 7.8* 7.7* 7.4* 7.4* 7.4*  PHOS 5.6* 5.3* 4.8* 5.7*  --   --  3.3  ALBUMIN 2.0* 2.0* 2.1* 2.1* 2.1*  --  2.0*    Liver Function Tests: Recent Labs  Lab 06/23/24 0500 06/24/24 0457 06/26/24 0312  ALBUMIN 2.1* 2.1* 2.0*   No results for input(s): LIPASE, AMYLASE in the last 168 hours. No results for input(s): AMMONIA in the  last 168 hours. CBC: Recent Labs    03/21/24 1037 05/09/24 1159 06/19/24 0437 06/22/24 0510 06/24/24 0457 06/25/24 0425 06/25/24 1446 06/25/24 2022 06/26/24 0312  HGB 11.3*   < >  --    < > 8.4* 7.8* 8.3* 8.8* 7.5*  MCV 93   < >  --    < > 88.1 87.7 87.7 88.1 87.2  FERRITIN 60  --  120  --   --   --   --   --   --   TIBC 298  --  209*  --   --   --   --   --   --   IRON 52  --  107  --   --   --   --   --   --    < > = values in this interval not displayed.    Cardiac Enzymes: No results for input(s): CKTOTAL, CKMB, CKMBINDEX, TROPONINI in the last 168 hours.  CBG: Recent Labs  Lab 06/25/24 0638 06/25/24 1149 06/25/24 1645 06/26/24 0621  06/26/24 1036  GLUCAP 206* 314* 333* 228* 143*    Iron Studies:  No results for input(s): IRON, TIBC, TRANSFERRIN, FERRITIN in the last 72 hours.  Studies/Results: IR TUNNELED CENTRAL VENOUS CATH [facility] W IMG Result Date: 06/24/2024 INDICATION: End-stage renal disease and needs hemodialysis. EXAM: FLUOROSCOPIC AND ULTRASOUND GUIDED PLACEMENT OF A TUNNELED DIALYSIS CATHETER Physician: Juliene SAUNDERS. Philip, MD MEDICATIONS: Ancef  2 g; The antibiotic was administered within an appropriate time interval prior to skin puncture. ANESTHESIA/SEDATION: Moderate (conscious) sedation was employed during this procedure. A total of Versed  2 mg and fentanyl  100 mcg and Dilaudid  0.5 mg was administered intravenously at the order of the provider performing the procedure. Total intra-service moderate sedation time: 21 minutes. Patient's level of consciousness and vital signs were monitored continuously by radiology nurse throughout the procedure under the supervision of the provider performing the procedure. FLUOROSCOPY TIME:  Radiation Exposure Index (as provided by the fluoroscopic device): 4 mGy Kerma COMPLICATIONS: None immediate. PROCEDURE: Informed consent was obtained for placement of a tunneled dialysis catheter. The patient was placed supine on the interventional table. Ultrasound confirmed a patent right internal jugular vein. Ultrasound image obtained for documentation. The right neck and chest was prepped and draped in a sterile fashion. Maximal barrier sterile technique was utilized including caps, mask, sterile gowns, sterile gloves, sterile drape, hand hygiene and skin antiseptic. The right neck was anesthetized with 1% lidocaine . A small incision was made with #11 blade scalpel. A 21 gauge needle directed into the right internal jugular vein with ultrasound guidance. A micropuncture dilator set was placed. A 23 cm tip to cuff Palindrome catheter was selected. The skin below the right clavicle was anesthetized  and a small incision was made with an #11 blade scalpel. A subcutaneous tunnel was formed to the vein dermatotomy site. The catheter was brought through the tunnel. The vein dermatotomy site was dilated to accommodate a peel-away sheath over a wire. The catheter was placed through the peel-away sheath and directed into the central venous structures. The tip of the catheter was placed at superior cavoatrial junction with fluoroscopy. Fluoroscopic images were obtained for documentation. Both lumens were found to aspirate and flush well. The proper amount of heparin  was flushed in both lumens. The vein dermatotomy site was closed using a single layer of absorbable suture and Dermabond. Gel-Foam placed in the subcutaneous tract. The catheter was secured to the skin using Prolene suture. IMPRESSION:  Successful placement of a right jugular tunneled dialysis catheter using ultrasound and fluoroscopic guidance. Electronically Signed   By: Juliene Balder M.D.   On: 06/24/2024 17:31    Medications: Infusions:  desmopressin  (DDAVP ) 20 mcg in sodium chloride  0.9 % 50 mL IVPB 20 mcg (06/26/24 1028)   sodium chloride  irrigation      Scheduled Medications:  acetaminophen   650 mg Oral Q8H   amLODipine   10 mg Oral Daily   atorvastatin   40 mg Oral Daily   busPIRone   7.5 mg Oral BID   carvedilol   3.125 mg Oral BID   Chlorhexidine  Gluconate Cloth  6 each Topical Q0600   Chlorhexidine  Gluconate Cloth  6 each Topical Q0600   Chlorhexidine  Gluconate Cloth  6 each Topical Q0600   ezetimibe   10 mg Oral Daily   feeding supplement (GLUCERNA SHAKE)  237 mL Oral TID BM   insulin  aspart  0-9 Units Subcutaneous TID WC   insulin  glargine  3 Units Subcutaneous Once   [START ON 06/27/2024] insulin  glargine  8 Units Subcutaneous Daily   lidocaine   2 patch Transdermal Q24H   pantoprazole   40 mg Oral Daily   polyethylene glycol  17 g Oral BID   [START ON 06/27/2024] predniSONE   10 mg Oral Q breakfast   QUEtiapine   25 mg Oral QHS    senna  2 tablet Oral Daily   sodium bicarbonate   1,300 mg Oral BID   tamsulosin   0.4 mg Oral Daily    have reviewed scheduled and prn medications.  Physical Exam: General:NAD, comfortable Heart:RRR, s1s2 nl Lungs:clear b/l, no crackle Abdomen: Soft, nontender.  Distended due to obesity. Extremities:No edema Neurology: Alert awake following commands GU: Urine is less dark and more clearer than yesterday. Vascular HD access; RIJ TDC blood around the catheter site, no active bleeding.  Saadiya Wilfong Prasad Lachele Lievanos 06/26/2024,10:54 AM  LOS: 10 days

## 2024-06-26 NOTE — Progress Notes (Signed)
 Brief Interventional Radiology Note:  IR paged about patient's Tunneled HD catheter bleeding. Patient assessed at the bedside. On arrival, the tube was no longer bleeding. Nurse reports holding pressure for approximately 35 minutes and then placing ice pack over top to help with continued hemostasis. Bandage was fully soaked so this was removed. Skin was cleaned with sterile saline and guaze. Central line kit brought to bedside and HD catheter was again cleaned off with chlorhexidine  and a new bandage was placed over top the HD catheter.  Patient reports the area is still sore from the procedure, but otherwise is without complaints.   Patient to go down for dialysis tomorrow morning. Please reach out to IR if there are any issues using HD catheter or concerns for re-bleeding.   Electronically Signed: Caston Coopersmith M Bret Stamour, PA-C 06/26/2024, 2:13 PM

## 2024-06-26 NOTE — Assessment & Plan Note (Addendum)
 H/H 7.5 this morning w/ ongoing hematuria, possibly continued upper tract bleeding per urology therefore holding further anticoagulation.  Not a candidate for TXA given recent CVA.  If worsening, consider IR embolization.  Will monitor UOP closely - Urology consult, appreciate recommendations - Monitor CBC PM and UOP - Continue Flomax  0.4 mg Daily

## 2024-06-26 NOTE — Progress Notes (Addendum)
 Daily Progress Note Intern Pager: [phone]  Patient name: Edward [name] Medical record number: [id number] Date of birth: [birth year] Age: 53 y.o. Gender: male  Primary Care Provider: Donzetta Rollene BRAVO, MD Consultants: Nephrology, urology, IR Code Status: Full code  Pt Overview and Major Events to Date:  9/4 : Admitted for back pain  9/11 : Renal biopsy with IR  9/12: TDC placed by IR, HD initiated  Assessment and Plan: 53 year old male with PMHx ankylosing spondylitis, CKDV, CVA, HTN, T2DM and, HLD, bipolar 1 and migraines presenting with back pain in the setting of flare of ankylosing spondylitis . Patient undergone Renal Biopsy on 06/22/24 w/ IR and initiated on HD per nephrology. His hospitalization c/b urinary retention required in and out cath then foley following by hematuria Renal US  on 9/11 shows perirenal hemorrhage s/p renal biopsy and bladder hematoma. In addition, his kidney fx has also become worsening now on iHD.  Assessment & Plan Ankylosing spondylitis flare Well-controlled on PO pain regimen. - Continue prednisone  taper - Pain regimen: - Acetaminophen  650 mg every 8 hours scheduled - Oxycodone  5-10 mg every 4 hours as needed for moderate/severe pain - Lidocaine  patches - Tizanidine  4 mg every 8 hours as needed - Need outpatient f/u with Rheumatology  - PT/OT : CIR ESRD (end stage renal disease) on dialysis (HCC) On PRN iHD per Nephrology due to no sign of renal recovery.  TDC placed by IR 9/12.  - Nephrology consultation, appreciate recommendations - Pending nephrology recommendation for follow-up HD session - Renal diet with 1200 ml fluid restriction  - Strict I's and O's - Electrolyte management as indicated Gross hematuria H/H 7.5 this morning w/ ongoing hematuria, possibly continued upper tract bleeding per urology therefore holding further anticoagulation.  Not a candidate for TXA given recent CVA.  If worsening, consider IR embolization.  Will monitor UOP  closely - Urology consult, appreciate recommendations - Monitor CBC PM and UOP - Continue Flomax  0.4 mg Daily H/O: CVA (cerebrovascular accident) CVA 4 weeks ago, on ASA prior to admission. ASA then was stopped for ASA washout pre-kidney biopsy (9/5 - 9/10) and continue to hold due  to hematoma/hematuria  - Neurology weighed in on 9/13- hold aspirin  until bleeding resolved  - Continue to hold ASA and SQH  - Continue statin, ezetimibe   Chronic health problem HTN: Continue home amLODipine  10 mg daily, Carvedilol  3.125 mg BID DMII: sSSI, Glargine 5 units started 9/13  HLD: Continue Ezetimibe  10 mg daily Bipolar 1 disorder: Continue QUEtiapine  25 mg at bedtime Depression: Continue Buspar  7.5 mg daily  Constipation: Continue MiraLAX  twice daily, senna daily  FEN/GI: Renal fluid restriction  PPx: SCDs, holding anticoagulation in the setting of hematuria Dispo: Home pending hematuria improvement HD chair placement  Subjective:  Mr. Millon is doing well this morning. State had iHD last night. Denies lightheaded or dizziness.   Objective: Temp:  [97.7 F (36.5 C)-98.4 F (36.9 C)] 98 F (36.7 C) (09/14 0846) Pulse Rate:  [56-70] 67 (09/14 0916) Resp:  [11-18] 16 (09/14 0846) BP: (124-152)/(69-89) 149/76 (09/14 0916) SpO2:  [96 %-100 %] 96 % (09/14 0846) Physical Exam: General: Sitting up in bed, NAD Cardiovascular: RRR without murmur Respiratory: CTAB.  Normal work of breathing on room air Abdomen: Soft, nontender, nondistended.  Foley catheter in place draining blood-tinged urine, Last BM 06/25/24 Extremities: No peripheral edema Vas cath : dressing w/ saturated blood  Laboratory: Most recent CBC Lab Results  Component Value Date   WBC 12.7 (H)  06/26/2024   HGB 7.5 (L) 06/26/2024   HCT 23.1 (L) 06/26/2024   MCV 87.2 06/26/2024   PLT 294 06/26/2024   Most recent BMP    Latest Ref Rng & Units 06/26/2024    3:12 AM  BMP  Glucose 70 - 99 mg/dL 747   BUN 6 - 20 mg/dL 72    Creatinine 9.38 - 1.24 mg/dL 5.72   Sodium 864 - 854 mmol/L 134   Potassium 3.5 - 5.1 mmol/L 3.8   Chloride 98 - 111 mmol/L 100   CO2 22 - 32 mmol/L 24   Calcium  8.9 - 10.3 mg/dL 7.4     Other pertinent labs: Glucose: 289 Hepatitis B surface antigen: Nonreactive  Imaging/Diagnostic Tests: IR TUNNELED CENTRAL VENOUS CATH [employer] W IMG Result Date: 06/24/2024 IMPRESSION: Successful placement of a right jugular tunneled dialysis catheter using ultrasound and fluoroscopic guidance. Electronically Signed   By: Juliene Balder M.D.   On: 06/24/2024 17:31     Suzen Houston NOVAK, DO 06/26/2024, 9:18 AM  PGY-1, [facility] FPTS Intern pager: [phone], text pages welcome Secure chat group [facility] Teaching Service

## 2024-06-26 NOTE — Assessment & Plan Note (Addendum)
 On PRN iHD per Nephrology due to no sign of renal recovery.  TDC placed by IR 9/12.  - Nephrology consultation, appreciate recommendations - Pending nephrology recommendation for follow-up HD session - Renal diet with 1200 ml fluid restriction  - Strict I's and O's - Electrolyte management as indicated

## 2024-06-26 NOTE — Progress Notes (Signed)
   Subjective When seen this morning was having some bleeding from [facility] site and awaiting evaluation by IR.  Started hemodialysis 9/12   Physical Exam: BP (!) 149/76   Pulse 67   Temp 98 F (36.7 C)   Resp 16   Ht 6' (1.829 m)   Wt (P) 122.4 kg Comment: Bed Scale  SpO2 96%   BMI (P) 36.60 kg/m    Intake/Output from previous day: 09/13 0701 - 09/14 0700 In: 9000  Out: 9275 [Urine:8275]   Constitutional:  Alert. Respiratory: Normal respiratory effort, no increased work of breathing. GU: CBI was off and urine in tubing light pink   Lab Results:  Recent Labs    06/25/24 2022 06/26/24 0312  WBC 13.2* 12.7*  HGB 8.8* 7.5*  HCT 27.4* 23.1*  PLT 350 294   BMET Recent Labs    06/25/24 0425 06/26/24 0312  NA 135 134*  K 5.0 3.8  CL 102 100  CO2 20* 24  GLUCOSE 260* 252*  BUN 113* 72*  CREATININE 5.90* 4.27*  CALCIUM  7.4* 7.4*    Assessment & Plan:    1.  Gross hematuria following renal biopsy CBI off this morning and urine light pink Would continue slow drip CBI for another 24 hours   Glendia JAYSON Barba, MD

## 2024-06-27 ENCOUNTER — Encounter (HOSPITAL_COMMUNITY): Payer: Self-pay

## 2024-06-27 ENCOUNTER — Ambulatory Visit: Payer: PRIVATE HEALTH INSURANCE | Admitting: Family Medicine

## 2024-06-27 ENCOUNTER — Inpatient Hospital Stay (HOSPITAL_COMMUNITY): Payer: PRIVATE HEALTH INSURANCE

## 2024-06-27 DIAGNOSIS — M459 Ankylosing spondylitis of unspecified sites in spine: Secondary | ICD-10-CM | POA: Diagnosis not present

## 2024-06-27 DIAGNOSIS — N186 End stage renal disease: Secondary | ICD-10-CM | POA: Diagnosis not present

## 2024-06-27 DIAGNOSIS — Z992 Dependence on renal dialysis: Secondary | ICD-10-CM | POA: Diagnosis not present

## 2024-06-27 DIAGNOSIS — R310 Gross hematuria: Secondary | ICD-10-CM | POA: Diagnosis not present

## 2024-06-27 LAB — RENAL FUNCTION PANEL
Albumin: 2.2 g/dL — ABNORMAL LOW (ref 3.5–5.0)
Anion gap: 12 (ref 5–15)
BUN: 80 mg/dL — ABNORMAL HIGH (ref 6–20)
CO2: 25 mmol/L (ref 22–32)
Calcium: 7.7 mg/dL — ABNORMAL LOW (ref 8.9–10.3)
Chloride: 101 mmol/L (ref 98–111)
Creatinine, Ser: 5.06 mg/dL — ABNORMAL HIGH (ref 0.61–1.24)
GFR, Estimated: 13 mL/min — ABNORMAL LOW (ref >60)
Glucose, Bld: 177 mg/dL — ABNORMAL HIGH (ref 70–99)
Phosphorus: 4.3 mg/dL (ref 2.5–4.6)
Potassium: 4.4 mmol/L (ref 3.5–5.1)
Sodium: 138 mmol/L (ref 135–145)

## 2024-06-27 LAB — GLUCOSE, CAPILLARY
Glucose-Capillary: 168 mg/dL — ABNORMAL HIGH (ref 70–99)
Glucose-Capillary: 293 mg/dL — ABNORMAL HIGH (ref 70–99)
Glucose-Capillary: 136 mg/dL — ABNORMAL HIGH (ref 70–99)
Glucose-Capillary: 257 mg/dL — ABNORMAL HIGH (ref 70–99)

## 2024-06-27 LAB — CBC
HCT: 25.4 % — ABNORMAL LOW (ref 39.0–52.0)
Hemoglobin: 8.1 g/dL — ABNORMAL LOW (ref 13.0–17.0)
MCH: 28.4 pg (ref 26.0–34.0)
MCHC: 31.9 g/dL (ref 30.0–36.0)
MCV: 89.1 fL (ref 80.0–100.0)
Platelets: 295 K/uL (ref 150–400)
RBC: 2.85 MIL/uL — ABNORMAL LOW (ref 4.22–5.81)
RDW: 15.6 % — ABNORMAL HIGH (ref 11.5–15.5)
WBC: 14.3 K/uL — ABNORMAL HIGH (ref 4.0–10.5)
nRBC: 0.1 % (ref 0.0–0.2)

## 2024-06-27 LAB — VITAMIN B12: Vitamin B-12: 518 pg/mL (ref 180–914)

## 2024-06-27 LAB — C-REACTIVE PROTEIN: CRP: 1.1 mg/dL — ABNORMAL HIGH (ref <1.0)

## 2024-06-27 LAB — VITAMIN D 25 HYDROXY (VIT D DEFICIENCY, FRACTURES): Vit D, 25-Hydroxy: 24.07 ng/mL — ABNORMAL LOW (ref 30–100)

## 2024-06-27 LAB — FOLATE: Folate: 12.8 ng/mL (ref >5.9)

## 2024-06-27 MED ORDER — RENA-VITE PO TABS
1.0000 | ORAL_TABLET | Freq: Every day | ORAL | Status: DC
  Administered 2024-06-27 – 2024-07-01 (×5): 1 via ORAL
  Filled 2024-06-27 (×5): qty 1

## 2024-06-27 MED ORDER — DARBEPOETIN ALFA 60 MCG/0.3ML IJ SOSY: 60.0000 ug | PREFILLED_SYRINGE | INTRAMUSCULAR | Status: DC

## 2024-06-27 MED ORDER — PENTAFLUOROPROP-TETRAFLUOROETH EX AERO: 1.0000 | INHALATION_SPRAY | CUTANEOUS | Status: DC | PRN

## 2024-06-27 MED ORDER — LORAZEPAM 1 MG PO TABS
1.0000 mg | ORAL_TABLET | Freq: Once | ORAL | Status: AC
  Administered 2024-06-27: 1 mg via ORAL
  Filled 2024-06-27: qty 1

## 2024-06-27 MED ORDER — INSULIN ASPART 100 UNIT/ML IJ SOLN
5.0000 [IU] | Freq: Once | INTRAMUSCULAR | Status: AC
Start: 2024-06-27 — End: 2024-06-27
  Administered 2024-06-27: 5 [IU] via SUBCUTANEOUS

## 2024-06-27 MED ORDER — ALTEPLASE 2 MG IJ SOLR: 2.0000 mg | Freq: Once | INTRAMUSCULAR | Status: DC | PRN

## 2024-06-27 MED ORDER — ANTICOAGULANT SODIUM CITRATE 4% (200MG/5ML) IV SOLN: 5.0000 mL | Status: DC | PRN

## 2024-06-27 MED ORDER — LIDOCAINE-PRILOCAINE 2.5-2.5 % EX CREA: 1.0000 | TOPICAL_CREAM | CUTANEOUS | Status: DC | PRN

## 2024-06-27 MED ORDER — TRAZODONE HCL 50 MG PO TABS
25.0000 mg | ORAL_TABLET | Freq: Every day | ORAL | Status: DC
Start: 2024-06-27 — End: 2024-06-27

## 2024-06-27 MED ORDER — LIDOCAINE HCL (PF) 1 % IJ SOLN: 5.0000 mL | INTRAMUSCULAR | Status: DC | PRN

## 2024-06-27 MED ORDER — HEPARIN SODIUM (PORCINE) 1000 UNIT/ML DIALYSIS: 1000.0000 [IU] | INTRAMUSCULAR | Status: DC | PRN

## 2024-06-27 MED ORDER — CHLORHEXIDINE GLUCONATE CLOTH 2 % EX PADS: 6.0000 | MEDICATED_PAD | Freq: Every day | CUTANEOUS | Status: DC

## 2024-06-27 MED ORDER — TRAZODONE HCL 50 MG PO TABS
25.0000 mg | ORAL_TABLET | Freq: Once | ORAL | Status: AC
  Administered 2024-06-27: 25 mg via ORAL
  Filled 2024-06-27: qty 1

## 2024-06-27 MED ORDER — INSULIN ASPART 100 UNIT/ML IJ SOLN
0.0000 [IU] | Freq: Three times a day (TID) | INTRAMUSCULAR | Status: DC
  Administered 2024-06-27: 2 [IU] via SUBCUTANEOUS
  Administered 2024-06-27: 8 [IU] via SUBCUTANEOUS
  Administered 2024-06-28: 2 [IU] via SUBCUTANEOUS
  Administered 2024-06-28: 5 [IU] via SUBCUTANEOUS
  Administered 2024-06-30: 2 [IU] via SUBCUTANEOUS
  Administered 2024-07-01: 8 [IU] via SUBCUTANEOUS
  Administered 2024-07-01 – 2024-07-02 (×2): 3 [IU] via SUBCUTANEOUS

## 2024-06-27 MED ORDER — NEPRO/CARBSTEADY PO LIQD
237.0000 mL | Freq: Two times a day (BID) | ORAL | Status: DC
  Administered 2024-06-27 – 2024-07-02 (×9): 237 mL via ORAL
  Filled 2024-06-27: qty 237

## 2024-06-27 NOTE — Progress Notes (Addendum)
 Provided additional clinical info to [facility] admissions this morning that was not available on Friday. Awaiting final approval/confirmation of pt's acceptance for out-pt HD. Will assist as needed.   Randine Mungo Dialysis Navigator [phone]  Addendum at 2:35 pm: Pt has been accepted at [facility] on TTS 6:30 am chair time. Pt can start tomorrow or Thursday. For the first day, pt will need to arrive at 7:30 am to complete paperwork prior to treatment. After 1st day, pt will need to arrive at 6:15 am for 6:30 am chair time. Met with pt at bedside while receiving HD. Pt agreeable to plans and schedule letter provided. Education packet provided as well. HD arrangements placed on AVS. Update provided to nephrologist and contacted renal PA regarding clinic's need for orders at d/c. Pt states family will assist with transportation to HD appts at d/c.

## 2024-06-27 NOTE — Plan of Care (Signed)
 Problem: Education: Goal: Ability to describe self-care measures that may prevent or decrease complications (Diabetes Survival Skills Education) will improve 06/27/2024 0856 by Billee Marybelle Ragena SHAUNNA, RN Outcome: Progressing 06/27/2024 0855 by Billee Marybelle Ragena SHAUNNA, RN Outcome: Progressing 06/27/2024 0855 by Billee Marybelle Ragena SHAUNNA, RN Outcome: Progressing Goal: Individualized Educational Video(s) 06/27/2024 0856 by Billee Marybelle Ragena SHAUNNA, RN Outcome: Progressing 06/27/2024 0855 by Billee Marybelle Ragena SHAUNNA, RN Outcome: Progressing 06/27/2024 0855 by Billee Marybelle Ragena SHAUNNA, RN Outcome: Progressing   Problem: Coping: Goal: Ability to adjust to condition or change in health will improve 06/27/2024 0856 by Billee Marybelle Ragena SHAUNNA, RN Outcome: Progressing 06/27/2024 0855 by Billee Marybelle Ragena SHAUNNA, RN Outcome: Progressing 06/27/2024 0855 by Billee Marybelle Ragena SHAUNNA, RN Outcome: Progressing   Problem: Fluid Volume: Goal: Ability to maintain a balanced intake and output will improve 06/27/2024 0856 by Billee Marybelle Ragena SHAUNNA, RN Outcome: Progressing 06/27/2024 0855 by Billee Marybelle Ragena SHAUNNA, RN Outcome: Progressing 06/27/2024 0855 by Billee Marybelle Ragena SHAUNNA, RN Outcome: Progressing   Problem: Health Behavior/Discharge Planning: Goal: Ability to identify and utilize available resources and services will improve 06/27/2024 0856 by Billee Marybelle Ragena SHAUNNA, RN Outcome: Progressing 06/27/2024 0855 by Billee Marybelle Ragena SHAUNNA, RN Outcome: Progressing 06/27/2024 0855 by Billee Marybelle Ragena SHAUNNA, RN Outcome: Progressing Goal: Ability to manage health-related needs will improve 06/27/2024 0856 by Billee Marybelle Ragena SHAUNNA, RN Outcome: Progressing 06/27/2024 0855 by Billee Marybelle Ragena SHAUNNA, RN Outcome: Progressing 06/27/2024 0855 by Billee Marybelle Ragena SHAUNNA, RN Outcome: Progressing   Problem: Metabolic: Goal: Ability to maintain appropriate glucose levels will  improve 06/27/2024 0856 by Billee Marybelle Ragena SHAUNNA, RN Outcome: Progressing 06/27/2024 0855 by Billee Marybelle Ragena SHAUNNA, RN Outcome: Progressing 06/27/2024 0855 by Billee Marybelle Ragena SHAUNNA, RN Outcome: Progressing   Problem: Nutritional: Goal: Maintenance of adequate nutrition will improve 06/27/2024 0856 by Billee Marybelle Ragena SHAUNNA, RN Outcome: Progressing 06/27/2024 0855 by Billee Marybelle Ragena SHAUNNA, RN Outcome: Progressing 06/27/2024 0855 by Billee Marybelle Ragena SHAUNNA, RN Outcome: Progressing Goal: Progress toward achieving an optimal weight will improve 06/27/2024 0856 by Billee Marybelle Ragena SHAUNNA, RN Outcome: Progressing 06/27/2024 0855 by Billee Marybelle Ragena SHAUNNA, RN Outcome: Progressing 06/27/2024 0855 by Billee Marybelle Ragena SHAUNNA, RN Outcome: Progressing   Problem: Skin Integrity: Goal: Risk for impaired skin integrity will decrease 06/27/2024 0856 by Billee Marybelle Ragena SHAUNNA, RN Outcome: Progressing 06/27/2024 0855 by Billee Marybelle Ragena SHAUNNA, RN Outcome: Progressing 06/27/2024 0855 by Billee Marybelle Ragena SHAUNNA, RN Outcome: Progressing   Problem: Tissue Perfusion: Goal: Adequacy of tissue perfusion will improve 06/27/2024 0856 by Billee Marybelle Ragena SHAUNNA, RN Outcome: Progressing 06/27/2024 0855 by Billee Marybelle Ragena SHAUNNA, RN Outcome: Progressing 06/27/2024 0855 by Billee Marybelle Ragena SHAUNNA, RN Outcome: Progressing   Problem: Education: Goal: Knowledge of General Education information will improve Description: Including pain rating scale, medication(s)/side effects and non-pharmacologic comfort measures 06/27/2024 0856 by Billee Marybelle Ragena SHAUNNA, RN Outcome: Progressing 06/27/2024 0855 by Billee Marybelle Ragena SHAUNNA, RN Outcome: Progressing 06/27/2024 0855 by Billee Marybelle Ragena SHAUNNA, RN Outcome: Progressing   Problem: Health Behavior/Discharge Planning: Goal: Ability to manage health-related needs will improve 06/27/2024 0856 by Billee Marybelle Ragena SHAUNNA,  RN Outcome: Progressing 06/27/2024 0855 by Billee Marybelle Ragena SHAUNNA, RN Outcome: Progressing 06/27/2024 0855 by Billee Marybelle Ragena SHAUNNA, RN Outcome: Progressing   Problem: Clinical Measurements: Goal: Ability to maintain clinical measurements within normal limits will improve 06/27/2024 0856 by Billee Marybelle Ragena SHAUNNA, RN Outcome: Progressing 06/27/2024 0855 by Billee Marybelle  Ragena SQUIBB, RN Outcome: Progressing 06/27/2024 0855 by Billee Marybelle Ragena SQUIBB, RN Outcome: Progressing Goal: Will remain free from infection 06/27/2024 0856 by Billee Marybelle Ragena SQUIBB, RN Outcome: Progressing 06/27/2024 0855 by Billee Marybelle Ragena SQUIBB, RN Outcome: Progressing 06/27/2024 0855 by Billee Marybelle Ragena SQUIBB, RN Outcome: Progressing Goal: Diagnostic test results will improve 06/27/2024 0856 by Billee Marybelle Ragena SQUIBB, RN Outcome: Progressing 06/27/2024 0855 by Billee Marybelle Ragena SQUIBB, RN Outcome: Progressing 06/27/2024 0855 by Billee Marybelle Ragena SQUIBB, RN Outcome: Progressing Goal: Respiratory complications will improve 06/27/2024 0856 by Billee Marybelle Ragena SQUIBB, RN Outcome: Progressing 06/27/2024 0855 by Billee Marybelle Ragena SQUIBB, RN Outcome: Progressing 06/27/2024 0855 by Billee Marybelle Ragena SQUIBB, RN Outcome: Progressing Goal: Cardiovascular complication will be avoided 06/27/2024 0856 by Billee Marybelle Ragena SQUIBB, RN Outcome: Progressing 06/27/2024 0855 by Billee Marybelle Ragena SQUIBB, RN Outcome: Progressing 06/27/2024 0855 by Billee Marybelle Ragena SQUIBB, RN Outcome: Progressing   Problem: Activity: Goal: Risk for activity intolerance will decrease 06/27/2024 0856 by Billee Marybelle Ragena SQUIBB, RN Outcome: Progressing 06/27/2024 0855 by Billee Marybelle Ragena SQUIBB, RN Outcome: Progressing 06/27/2024 0855 by Billee Marybelle Ragena SQUIBB, RN Outcome: Progressing   Problem: Nutrition: Goal: Adequate nutrition will be maintained 06/27/2024 0856 by Billee Marybelle Ragena SQUIBB, RN Outcome:  Progressing 06/27/2024 0855 by Billee Marybelle Ragena SQUIBB, RN Outcome: Progressing 06/27/2024 0855 by Billee Marybelle Ragena SQUIBB, RN Outcome: Progressing   Problem: Coping: Goal: Level of anxiety will decrease 06/27/2024 0856 by Billee Marybelle Ragena SQUIBB, RN Outcome: Progressing 06/27/2024 0855 by Billee Marybelle Ragena SQUIBB, RN Outcome: Progressing 06/27/2024 0855 by Billee Marybelle Ragena SQUIBB, RN Outcome: Progressing   Problem: Elimination: Goal: Will not experience complications related to bowel motility 06/27/2024 0856 by Billee Marybelle Ragena SQUIBB, RN Outcome: Progressing 06/27/2024 0855 by Billee Marybelle Ragena SQUIBB, RN Outcome: Progressing 06/27/2024 0855 by Billee Marybelle Ragena SQUIBB, RN Outcome: Progressing Goal: Will not experience complications related to urinary retention 06/27/2024 0856 by Billee Marybelle Ragena SQUIBB, RN Outcome: Progressing 06/27/2024 0855 by Billee Marybelle Ragena SQUIBB, RN Outcome: Progressing 06/27/2024 0855 by Billee Marybelle Ragena SQUIBB, RN Outcome: Progressing   Problem: Pain Managment: Goal: General experience of comfort will improve and/or be controlled 06/27/2024 0856 by Billee Marybelle Ragena SQUIBB, RN Outcome: Progressing 06/27/2024 0855 by Billee Marybelle Ragena SQUIBB, RN Outcome: Progressing 06/27/2024 0855 by Billee Marybelle Ragena SQUIBB, RN Outcome: Progressing   Problem: Safety: Goal: Ability to remain free from injury will improve 06/27/2024 0856 by Billee Marybelle Ragena SQUIBB, RN Outcome: Progressing 06/27/2024 0855 by Billee Marybelle Ragena SQUIBB, RN Outcome: Progressing 06/27/2024 0855 by Billee Marybelle Ragena SQUIBB, RN Outcome: Progressing   Problem: Skin Integrity: Goal: Risk for impaired skin integrity will decrease 06/27/2024 0856 by Billee Marybelle Ragena SQUIBB, RN Outcome: Progressing 06/27/2024 0855 by Billee Marybelle Ragena SQUIBB, RN Outcome: Progressing 06/27/2024 0855 by Billee Marybelle Ragena SQUIBB, RN Outcome: Progressing

## 2024-06-27 NOTE — Progress Notes (Signed)
 Inpatient Rehab Admissions Coordinator:   Therapies are now recommending patient discharge home with Home Health services. Will sign off at this time.   Rehab Admissons Coordinator Keller Bounds, , [place] [phone]

## 2024-06-27 NOTE — Progress Notes (Signed)
     Subjective: No acute events overnight.  Patient reports to be tolerating the catheter well without incidence of clot obstruction.  He was accompanied by his mother.  Case and plan was reviewed with them both.  Objective: Vital signs in last 24 hours: Temp:  [98.2 F (36.8 C)-98.6 F (37 C)] 98.6 F (37 C) (09/14 2152) Pulse Rate:  [63-70] 63 (09/14 2152) Resp:  [16-18] 18 (09/14 2152) BP: (123-133)/(69-70) 123/70 (09/14 2152) SpO2:  [94 %-96 %] 95 % (09/14 2152)  Assessment/Plan: # Gross hematuria following renal biopsy  Patient is concerned due to improve over the weekend.  CBI clamped as of this morning with light pink urine/irrigant.  No clot obstruction or further passage of clot material since my initial hand irrigation.  Reasonable to proceed with voiding trial.  Will defer to primary team/neurology regarding restarting of any anticoagulation/antiplatelet medications.  Patient can follow-up with urology on an as-needed basis.  Nursing to save samples of urine if hematuria worsens and contact us  if any residual clot material obstructs his urethra during voiding trial.  I will check on him again tomorrow but otherwise is good to discharge from a urologic perspective.  Please feel free to call with questions.  Intake/Output from previous day: 09/14 0701 - 09/15 0700 In: 3300  Out: 8275 [Urine:8275]  Intake/Output this shift: Total I/O In: 3120 [P.O.:120; Other:3000] Out: 1650 [Urine:1650]  Physical Exam:  General: Alert and oriented CV: No cyanosis Lungs: equal chest rise Abdomen: Soft, NTND, no rebound or guarding Gu: Three-way 29f in place draining blood-tinged irrigant  Lab Results: Recent Labs    06/26/24 0312 06/26/24 1615 06/27/24 0556  HGB 7.5* 8.1* 8.1*  HCT 23.1* 25.2* 25.4*   BMET Recent Labs    06/26/24 0312 06/26/24 1615 06/27/24 0556  NA 134*  --  138  K 3.8  --  4.4  CL 100  --  101  CO2 24  --  25  GLUCOSE 252*  --  177*  BUN 72*  --   80*  CREATININE 4.27*  --  5.06*  CALCIUM  7.4*  --  7.7*  HGB 7.5* 8.1* 8.1*  WBC 12.7* 12.7* 14.3*     Studies/Results: No results found.     LOS: 11 days   Ole Bourdon, NP [facility] Pager: [phone]  06/27/2024, 10:17 AM

## 2024-06-27 NOTE — Assessment & Plan Note (Signed)
 Stable H/H w/o blood transfusion. Improved hematuria from upper tract bleeding. Continue to hold anticoagulation.  Not a candidate for TXA given recent CVA.  If worsening, consider IR embolization.   - Urology consult, appreciate recommendations Slow drip CBI for since yesterday  - Monitor CBC PM and UOP - Continue Flomax  0.4 mg Daily

## 2024-06-27 NOTE — Assessment & Plan Note (Signed)
 Well-controlled on PO pain regimen. - Continue prednisone  taper - Pain regimen: - Acetaminophen  650 mg every 8 hours scheduled - Oxycodone  5-10 mg every 4 hours as needed for moderate/severe pain - Lidocaine  patches - Tizanidine  4 mg every 8 hours as needed - Need outpatient f/u with Rheumatology  - PT/OT follow

## 2024-06-27 NOTE — Progress Notes (Signed)
 Physical Therapy Treatment  Patient Details Name: Edward [name] MRN: [id number] DOB: [birth year] Today's Date: 06/27/2024   History of Present Illness Pt is a 53 y.o. male presenting 06/15/24 with R groin pain that radiates to lower back and causing LE weakness and inability to walk.  MRI showed increased size L4-5 disc extrusion and unchanged L5-S1 central disc protrusion.  PMH: DMII, HTN, HLD, ankylosing spondylitis in spine, bipolar 1, CKD V (pending HD), gastric bypass, L thalamic CVA 05/22/24, remote infarcts L basal ganglia and L thalamus    PT Comments  Pt progressing towards physical therapy goals. Was able to perform transfers and ambulation with gross supervision for safety and occasional CGA with rollator for support. Pt did not require any seated rest breaks on rollator this session, however pt reports preferring the freedom of movement that the rollator allows vs the RW. Will continue to follow and progress as able per POC.    If plan is discharge home, recommend the following: Assistance with cooking/housework;Assist for transportation;Help with stairs or ramp for entrance;A little help with walking and/or transfers;A little help with bathing/dressing/bathroom   Can travel by private vehicle     Yes  Equipment Recommendations  Rollator (4 wheels)    Recommendations for Other Services OT consult     Precautions / Restrictions Precautions Precautions: Fall Recall of Precautions/Restrictions: Intact Restrictions Weight Bearing Restrictions Per Provider Order: No     Mobility  Bed Mobility Overal bed mobility: Needs Assistance Bed Mobility: Supine to Sit     Supine to sit: Supervision     General bed mobility comments: Increased time and effort. Pt was able to transition to EOB with increased time. Encourated log roll but pt reports discomfort from catheter.    Transfers Overall transfer level: Needs assistance Equipment used: Rollator (4 wheels) Transfers: Sit  to/from Stand Sit to Stand: Supervision           General transfer comment: Pt was able to power up to full stand without assistance. Pt appears slightly unsteady initially but able to recover without assist.    Ambulation/Gait Ambulation/Gait assistance: Supervision Gait Distance (Feet): 250 Feet Assistive device: Rollator (4 wheels) Gait Pattern/deviations: Step-through pattern, Decreased stance time - right, Drifts right/left Gait velocity: Decreased Gait velocity interpretation: >2.62 ft/sec, indicative of community ambulatory   General Gait Details: Pt with flexed posture initially but able to improve with distance. No unsteadiness or LOB noted with rollator, reports he prefers rollator due to more freedom of movement.   Stairs             Wheelchair Mobility     Tilt Bed    Modified Rankin (Stroke Patients Only) Modified Rankin (Stroke Patients Only) Pre-Morbid Rankin Score: Slight disability Modified Rankin: Severe disability     Balance Overall balance assessment: Needs assistance Sitting-balance support: Single extremity supported, No upper extremity supported, Feet supported Sitting balance-Leahy Scale: Normal Sitting balance - Comments: able to sit at EOB with Supervision   Standing balance support: Bilateral upper extremity supported, During functional activity, Reliant on assistive device for balance Standing balance-Leahy Scale: Fair Standing balance comment: Rollator for intermittent stability                            Communication Communication Communication: No apparent difficulties Factors Affecting Communication: Reduced clarity of speech;Difficulty expressing self (occasional slurred speech; occasional difficulty with word finding)  Cognition Arousal: Alert Behavior During Therapy: [facility] for tasks assessed/performed  PT - Cognitive impairments: Memory, History of cognitive impairments                       PT -  Cognition Comments: STM deficits since CVA as well as word finding difficulties. Though this is not nearly as pronounced on 06/25/2024 as on eval Following commands: Intact Following commands impaired: Follows multi-step commands with increased time    Cueing Cueing Techniques: Verbal cues  Exercises      General Comments        Pertinent Vitals/Pain Pain Assessment Pain Assessment: Faces Faces Pain Scale: Hurts a little bit Pain Location: back Pain Descriptors / Indicators: Discomfort Pain Intervention(s): Limited activity within patient's tolerance, Monitored during session, Repositioned    Home Living                          Prior Function            PT Goals (current goals can now be found in the care plan section) Acute Rehab PT Goals Patient Stated Goal: Get well, return to work PT Goal Formulation: With patient Time For Goal Achievement: 06/30/24 Potential to Achieve Goals: Fair Progress towards PT goals: Progressing toward goals    Frequency    Min 2X/week      PT Plan      Co-evaluation              AM-PAC PT 6 Clicks Mobility   Outcome Measure  Help needed turning from your back to your side while in a flat bed without using bedrails?: None Help needed moving from lying on your back to sitting on the side of a flat bed without using bedrails?: None Help needed moving to and from a bed to a chair (including a wheelchair)?: A Little Help needed standing up from a chair using your arms (e.g., wheelchair or bedside chair)?: A Little Help needed to walk in hospital room?: A Little Help needed climbing 3-5 steps with a railing? : A Lot 6 Click Score: 19    End of Session Equipment Utilized During Treatment: Gait belt Activity Tolerance: Patient tolerated treatment well Patient left: with call bell/phone within reach;in chair Nurse Communication: Mobility status PT Visit Diagnosis: Other symptoms and signs involving the nervous system  (R29.898);Pain;Muscle weakness (generalized) (M62.81) Pain - part of body:  (back)     Time: 9061-9041 PT Time Calculation (min) (ACUTE ONLY): 20 min  Charges:    $Gait Training: 8-22 mins PT General Charges $$ ACUTE PT VISIT: 1 Visit                     Leita Sable, PT, DPT Acute Rehabilitation Services Secure Chat Preferred Office: [phone]    Leita JONETTA Sable 06/27/2024, 11:46 AM

## 2024-06-27 NOTE — Consult Note (Addendum)
 Hospital Consult    Reason for Consult:  new dialysis access Requesting Physician:  Jerrye MRN #:  [id number]  History of Present Illness: This is a 53 y.o. male who presented to the hospital with back pain and CKD.  He has hx of DM, CVA, HTN, OSA, anxiety.  He has hx of gastric bypass.  He has hx of CKD 5 and now needing HD.  He got a TDC with IR on 06/24/2024.  VVS is consulted for further evaluation.  He is right hand dominant.  He has not had any surgeries on his arms.   The pt is on a statin for cholesterol management.  The pt is on a daily aspirin .   Other AC:  none The pt is on CCB, BB for hypertension.   The pt is diabetic on SSI Tobacco hx:  former  Past Medical History:  Diagnosis Date   Ankylosing spondylitis (HCC) 1993   Bipolar depression (HCC) 2005   CVA (cerebral vascular accident) (HCC) 02/2024   DM (diabetes mellitus), type 2 (HCC) 2007   Generalized anxiety disorder 1997   High-functioning autism spectrum disorder 2005   Renal disorder    CKD stage 4   Rheumatic fever age 23    Past Surgical History:  Procedure Laterality Date   arthroscopic surgery Left years ago   both knee cartlidge surgery     GASTRIC ROUX-EN-Y N/A 08/06/2015   Procedure: LAPAROSCOPIC ROUX-EN-Y GASTRIC BYPASS WITH UPPER ENDOSCOPY;  Surgeon: Camellia Blush, MD;  Location: [facility];  Service: General;  Laterality: N/A;   HIP SURGERY Left 1986   with knee surgery   IR TUNNELED CENTRAL VENOUS CATH PLC W IMG  06/24/2024   KNEE SURGERY Right 1986   benign tumor with bone graft    UPPER GI ENDOSCOPY  08/06/2015   Procedure: UPPER GI ENDOSCOPY;  Surgeon: Camellia Blush, MD;  Location: [facility];  Service: General;;    Allergies  Allergen Reactions   Demerol [Meperidine] Swelling    Swelling of hands, neck   Nsaids Other (See Comments)    Gi upset; cannot take due to kidney disease.   Tapentadol Other (See Comments)    Skin crawling;cannot take due to Kidney Disease    Tramadol  Other (See  Comments)    Cannot take due to kidney disease.     Prior to Admission medications   Medication Sig Start Date End Date Taking? Authorizing Provider  acetaminophen  (ACETAMINOPHEN  8 HOUR) 650 MG CR tablet Take 1,300 mg by mouth 2 (two) times daily.   Yes [provider]  amLODipine  (NORVASC ) 10 MG tablet Take 1 tablet (10 mg total) by mouth daily. 05/09/24  Yes Myrna Bitters, DO  aspirin  EC 81 MG tablet Take 1 tablet (81 mg total) by mouth daily. Swallow whole. 05/25/24  Yes Everhart, Kirstie, DO  atorvastatin  (LIPITOR) 40 MG tablet Take 1 tablet (40 mg total) by mouth daily. 05/25/24  Yes Everhart, Kirstie, DO  carvedilol  (COREG ) 3.125 MG tablet Take 3.125 mg by mouth 2 (two) times daily. 05/18/24  Yes [provider]  diclofenac Sodium (VOLTAREN) 1 % GEL Apply 4 g topically in the morning and at bedtime. Apply topically to neck and lumbar spine twice per day.   Yes [provider]  ezetimibe  (ZETIA ) 10 MG tablet Take 1 tablet (10 mg total) by mouth daily. 05/25/24  Yes Everhart, Kirstie, DO  ferrous sulfate  325 (65 FE) MG tablet Take 1 tablet (325 mg total) by mouth daily with breakfast.  03/21/24  Yes Pray, Rollene BRAVO, MD  pantoprazole  (PROTONIX ) 40 MG tablet Take 1 tablet (40 mg total) by mouth daily. 05/25/24  Yes Everhart, Kirstie, DO  QUEtiapine  (SEROQUEL ) 25 MG tablet Take 1 tablet 25mg  at night for three days, if not oversedated can increase to 2 tablets 50mg  total at night. Patient taking differently: 50 mg. 06/03/24  Yes Pray, Rollene BRAVO, MD  feeding supplement, GLUCERNA SHAKE, (GLUCERNA SHAKE) LIQD Take 237 mLs by mouth 3 (three) times daily between meals. 06/22/24   Gomes, Adriana, DO  lidocaine  (LIDODERM ) 5 % Place 2 patches onto the skin daily. Remove & Discard patch within 12 hours or as directed by MD 06/23/24   Janna Ferrier, DO  oxyCODONE  (OXY IR/ROXICODONE ) 5 MG immediate release tablet Take 1 tablet (5 mg total) by mouth every 6 (six) hours as needed for up to 14  days for severe pain (pain score 7-10) or moderate pain (pain score 4-6). 06/22/24 07/06/24  Gomes, Adriana, DO  polyethylene glycol powder (GLYCOLAX /MIRALAX ) 17 GM/SCOOP powder Dissolve 1 capful (17g) in 4-8 ounces of liquid and take by mouth twice daily 06/22/24   Gomes, Adriana, DO  predniSONE  (DELTASONE ) 10 MG tablet Take 3 tablets (30 mg total) by mouth daily with breakfast for 2 days, THEN 2 tablets (20 mg total) daily with breakfast for 2 days, THEN 1 tablet (10 mg total) daily with breakfast for 2 days. 06/23/24 06/29/24  Gomes, Adriana, DO  senna (SENOKOT) 8.6 MG TABS tablet Take 2 tablets (17.2 mg total) by mouth daily. 06/23/24   Gomes, Adriana, DO  sodium bicarbonate  650 MG tablet Take 2 tablets (1,300 mg total) by mouth 2 (two) times daily. 06/22/24   Gomes, Adriana, DO  tiZANidine  (ZANAFLEX ) 4 MG tablet Take 1 tablet (4 mg total) by mouth every 8 (eight) hours as needed for muscle spasms. 06/22/24   Janna Ferrier, DO    Social History   Socioeconomic History   Marital status: Married    Spouse name: Not on file   Number of children: Not on file   Years of education: Not on file   Highest education level: Doctorate  Occupational History   Not on file  Tobacco Use   Smoking status: Former    Current packs/day: 0.00    Average packs/day: 0.3 packs/day for 10.0 years (2.5 ttl pk-yrs)    Types: Cigarettes    Start date: 04/16/2002    Quit date: 04/16/2012    Years since quitting: 12.2   Smokeless tobacco: Never  Vaping Use   Vaping status: Never Used  Substance and Sexual Activity   Alcohol use: No    Alcohol/week: 0.0 standard drinks of alcohol   Drug use: No   Sexual activity: Yes    Partners: Female  Other Topics Concern   Not on file  Social History Narrative   02/22/16   Married: Damien Like   Children: Rosa (anxiety/depression), [name] (Autism, non-verbal), Wilbern Credit   Education: PhD in [employer]   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   Social  Drivers of Health   Financial Resource Strain: Patient Declined (03/20/2024)   Overall Financial Resource Strain (CARDIA)    Difficulty of Paying Living Expenses: Patient declined  Food Insecurity: No Food Insecurity (06/16/2024)   Hunger Vital Sign    Worried About Running Out of Food in the Last Year: Never true    Ran Out of Food in the Last Year: Never true  Transportation Needs: No Transportation Needs (06/16/2024)  PRAPARE - Administrator, Civil Service (Medical): No    Lack of Transportation (Non-Medical): No  Physical Activity: Insufficiently Active (03/20/2024)   Exercise Vital Sign    Days of Exercise per Week: 2 days    Minutes of Exercise per Session: 20 min  Stress: Stress Concern Present (03/20/2024)   [employer] of Occupational Health - Occupational Stress Questionnaire    Feeling of Stress : Rather much  Social Connections: Socially Isolated (06/16/2024)   Social Connection and Isolation Panel    Frequency of Communication with Friends and Family: Three times a week    Frequency of Social Gatherings with Friends and Family: Twice a week    Attends Religious Services: Never    Database administrator or Organizations: No    Attends Banker Meetings: Never    Marital Status: Divorced  Catering manager Violence: Not At Risk (06/16/2024)   Humiliation, Afraid, Rape, and Kick questionnaire    Fear of Current or Ex-Partner: No    Emotionally Abused: No    Physically Abused: No    Sexually Abused: No     Family History  Problem Relation Age of Onset   Hyperlipidemia Mother    Hypertension Mother    Breast cancer Mother    Thyroid  disease Father    Early death Father 32       Cancer   Diabetes Father    Autoimmune disease Father        Ankylosing Spondylitis   Squamous cell carcinoma Father    Diabetes Brother    Hyperlipidemia Brother    Hypertension Brother    Thyroid  disease Brother    Alcohol abuse Brother    Heart disease Maternal  Grandmother    Stroke Maternal Grandmother    Heart disease Maternal Grandfather    Autoimmune disease Paternal Grandfather     ROS: [x]  Positive   [ ]  Negative   [ ]  All sytems reviewed and are negative  Cardiac: [x]  hx rheumatic fever   Vascular: [x]  swelling in legs  Pulmonary: []  asthma/wheezing []  home O2  Neurologic: [x]  hx of CVA []  mini stroke   Hematologic: []  hx of cancer  Endocrine:   [x]  diabetes []  thyroid  disease  GI []  GERD  GU: [x]  CKD/renal failure [x]  HD--[]  M/W/F or []  T/T/S  Psychiatric: [x]  anxiety []  depression  Musculoskeletal: [x]  ankylosing spondylitis    Integumentary: []  rashes []  ulcers  Constitutional: []  fever  []  chills  Physical Examination  Vitals:   06/26/24 1658 06/26/24 2152  BP: 125/69 123/70  Pulse: 70 63  Resp: 16 18  Temp: 98.2 F (36.8 C) 98.6 F (37 C)  SpO2: 94% 95%   Body mass index is 36.6 kg/m (pended).  General:  WDWN in NAD Gait: Not observed HENT: WNL, normocephalic Pulmonary: normal non-labored breathing Cardiac: regular Skin: without rashes Vascular Exam/Pulses:  Right Left  Radial 2+ (normal) 2+ (normal)   Extremities: IV present left forearm Musculoskeletal: no muscle wasting or atrophy  Neurologic: A&O X 3 Psychiatric:  The pt has Normal affect.   CBC    Component Value Date/Time   WBC 14.3 (H) 06/27/2024 0556   RBC 2.85 (L) 06/27/2024 0556   HGB 8.1 (L) 06/27/2024 0556   HGB 11.3 (L) 03/21/2024 1037   HCT 25.4 (L) 06/27/2024 0556   HCT 36.2 (L) 03/21/2024 1037   PLT 295 06/27/2024 0556   PLT 338 03/21/2024 1037   MCV 89.1 06/27/2024 0556  MCV 93 03/21/2024 1037   MCH 28.4 06/27/2024 0556   MCHC 31.9 06/27/2024 0556   RDW 15.6 (H) 06/27/2024 0556   RDW 14.2 03/21/2024 1037   LYMPHSABS 1.4 06/15/2024 1100   MONOABS 1.3 (H) 06/15/2024 1100   EOSABS 0.1 06/15/2024 1100   BASOSABS 0.1 06/15/2024 1100    BMET    Component Value Date/Time   NA 138 06/27/2024 0556    NA 141 05/06/2024 1552   K 4.4 06/27/2024 0556   CL 101 06/27/2024 0556   CO2 25 06/27/2024 0556   GLUCOSE 177 (H) 06/27/2024 0556   BUN 80 (H) 06/27/2024 0556   BUN 52 (H) 05/06/2024 1552   CREATININE 5.06 (H) 06/27/2024 0556   CREATININE 1.47 (H) 11/07/2021 0903   CALCIUM  7.7 (L) 06/27/2024 0556   GFRNONAA 13 (L) 06/27/2024 0556   GFRNONAA >89 05/02/2016 0925   GFRAA 108 12/15/2019 1030   GFRAA >89 05/02/2016 0925    COAGS: Lab Results  Component Value Date   INR 1.1 06/22/2024   INR 1.2 06/15/2024     Non-Invasive Vascular Imaging:   BUE vein mapping has been ordered    ASSESSMENT/PLAN: This is a 53 y.o. male now with ESRD in need of dialysis access.  He has TDC that was placed by IR on 06/24/2024   -pt right hand dominant-has adequate left cephalic vein on duplex.  I restricted left upper extremity and asked the IV be moved from the left arm.   -Discussed with pt steal syndrome.  Also discussed with pt that any access procedure will eventually need additional procedures and even new access as well as possibly needing the fistula to be superficialized if too deep.  He expressed good understanding -pt seen with Dr. Gretta.  Will plan on left AV fistula on Wednesday.  If he is ready for discharge, he can discharge home after surgery    Lucie Apt, PA-C [facility] [phone]   I have seen and evaluated the patient. I agree with the PA note as documented above.  53 year old male now with ESRD that vascular surgery has been consulted for permanent dialysis access.  Currently using a right IJ TDC.  States he is right-handed.  Discussed plan for left arm AV fistula versus graft on Wednesday in the OR with me.  Does appear to have usable cephalic vein and basilic vein on vein mapping.  All questions answered.  N.p.o. tomorrow night.  Lonni DOROTHA Gretta, MD [facility] [place] Office: [phone]

## 2024-06-27 NOTE — Progress Notes (Signed)
 Daily Progress Note Intern Pager: [phone]  Patient name: Edward [name] Medical record number: [id number] Date of birth: [birth year] Age: 53 y.o. Gender: male  Primary Care Provider: Donzetta Rollene BRAVO, MD Consultants: Nephrology, urology, IR Code Status: Full code  Pt Overview and Major Events to Date:  9/4 : Admitted for back pain  9/11 : Renal biopsy with IR  9/12: TDC placed by IR, HD initiated  Assessment and Plan: 53 year old male with PMHx ankylosing spondylitis, CKDV, CVA, HTN, T2DM and, HLD, bipolar 1 and migraines presenting with back pain in the setting of flare of ankylosing spondylitis . Patient undergone Renal Biopsy on 06/22/24 w/ IR and initiated on HD per nephrology. His hospitalization c/b urinary retention required in and out cath then foley following by hematuria Renal US  on 9/11 shows perirenal hemorrhage s/p renal biopsy and bladder hematoma. In addition, his kidney fx has also become worsening now on iHD.  Assessment & Plan Ankylosing spondylitis flare Well-controlled on PO pain regimen. - Continue prednisone  taper - Pain regimen: - Acetaminophen  650 mg every 8 hours scheduled - Oxycodone  5-10 mg every 4 hours as needed for moderate/severe pain - Lidocaine  patches - Tizanidine  4 mg every 8 hours as needed - Need outpatient f/u with Rheumatology  - PT/OT follow  ESRD (end stage renal disease) on dialysis (HCC) On PRN iHD per Nephrology due to no sign of renal recovery.  TDC placed by IR 9/12.  - Nephrology consultation, appreciate recommendations iHD today  - Renal diet with 1200 ml fluid restriction  - Strict I's and O's - Electrolyte management as indicated Gross hematuria Stable H/H w/o blood transfusion. Improved hematuria from upper tract bleeding. Continue to hold anticoagulation.  Not a candidate for TXA given recent CVA.  If worsening, consider IR embolization.   - Urology consult, appreciate recommendations Slow drip CBI for since yesterday  -  Monitor CBC PM and UOP - Continue Flomax  0.4 mg Daily H/O: CVA (cerebrovascular accident) CVA 4 weeks ago, on ASA prior to admission. ASA then was stopped for ASA washout pre-kidney biopsy (9/5 - 9/10) and continue to hold due  to hematoma/hematuria  - Neurology weighed in on 9/13- hold aspirin  until bleeding resolved  - Continue to hold ASA and SQH  - Continue statin, ezetimibe   Chronic health problem HTN: Continue home amLODipine  10 mg daily, Carvedilol  3.125 mg BID DMII: sSSI, Glargine 8 units started 9/14  HLD: Continue Ezetimibe  10 mg daily Bipolar 1 disorder: Continue QUEtiapine  25 mg at bedtime Depression: Continue Buspar  7.5 mg daily  Constipation: Continue MiraLAX  twice daily, senna daily  FEN/GI: Renal fluid restriction  PPx: SCDs, holding anticoagulation in the setting of hematuria Dispo: Home pending hematuria improvement HD chair placement  Subjective:  Edward [name] is doing well this morning. His pain is well control. Denies lightheaded or dizziness. States last BM was yesterday denies abdominal pain.   Objective: Temp:  [98 F (36.7 C)-98.6 F (37 C)] 98.6 F (37 C) (09/14 2152) Pulse Rate:  [62-70] 63 (09/14 2152) Resp:  [16-18] 18 (09/14 2152) BP: (123-149)/(69-80) 123/70 (09/14 2152) SpO2:  [94 %-96 %] 95 % (09/14 2152) Physical Exam: General: Sitting up in bed, NAD Cardiovascular: RRR without murmur Respiratory: CTAB.  Normal work of breathing on room air Abdomen: Soft, nontender, nondistended.  Foley catheter in place draining blood-tinged urine, Last BM 06/25/24 Extremities: No peripheral edema Vas cath : dressing w/ saturated blood GU : foley w/ CBI  - clear reddish urine output  Laboratory: Most recent CBC Lab Results  Component Value Date   WBC 14.3 (H) 06/27/2024   HGB 8.1 (L) 06/27/2024   HCT 25.4 (L) 06/27/2024   MCV 89.1 06/27/2024   PLT 295 06/27/2024   Most recent BMP    Latest Ref Rng & Units 06/27/2024    5:56 AM  BMP  Glucose 70 - 99  mg/dL 822   BUN 6 - 20 mg/dL 80   Creatinine 9.38 - 1.24 mg/dL 4.93   Sodium 864 - 854 mmol/L 138   Potassium 3.5 - 5.1 mmol/L 4.4   Chloride 98 - 111 mmol/L 101   CO2 22 - 32 mmol/L 25   Calcium  8.9 - 10.3 mg/dL 7.7     Other pertinent labs: Glucose: 168 Hepatitis B surface antigen: Nonreactive  Imaging/Diagnostic Tests: IR TUNNELED CENTRAL VENOUS CATH [facility] W IMG Result Date: 06/24/2024 IMPRESSION: Successful placement of a right jugular tunneled dialysis catheter using ultrasound and fluoroscopic guidance. Electronically Signed   By: Edward [name] M.D.   On: 06/24/2024 17:31     Edward Edward NOVAK, DO 06/27/2024, 8:27 AM  PGY-1, [facility] FPTS Intern pager: [phone], text pages welcome Secure chat group [facility] Teaching Service

## 2024-06-27 NOTE — Assessment & Plan Note (Signed)
 On PRN iHD per Nephrology due to no sign of renal recovery.  TDC placed by IR 9/12.  - Nephrology consultation, appreciate recommendations iHD today  - Renal diet with 1200 ml fluid restriction  - Strict I's and O's - Electrolyte management as indicated

## 2024-06-27 NOTE — Progress Notes (Signed)
   06/27/24 1648  Vitals  Temp 98.1 F (36.7 C)  Pulse Rate 63  Resp 13  BP (!) 141/76  SpO2 100 %  O2 Device Room Air  Weight 119.1 kg  Type of Weight Post-Dialysis  Oxygen Therapy  Pulse Oximetry Type Continuous  Oximetry Probe Site Changed No  Post Treatment  Dialyzer Clearance Lightly streaked  Hemodialysis Intake (mL) 0 mL  Liters Processed 53.9  Fluid Removed (mL) 1500 mL  Tolerated HD Treatment Yes   Received patient in bed to unit.  Alert and oriented.  Informed consent signed and in chart.   TX duration:3HRS  Patient tolerated well.  Transported back to the room  Alert, without acute distress.  Hand-off given to patient's nurse.   Access used: [facility] Access issues: NONE  Total UF removed: 1.5L Medication(s) given: NONE    Na'Shaminy T [facility]

## 2024-06-27 NOTE — Assessment & Plan Note (Signed)
 HTN: Continue home amLODipine  10 mg daily, Carvedilol  3.125 mg BID DMII: sSSI, Glargine 8 units started 9/14  HLD: Continue Ezetimibe  10 mg daily Bipolar 1 disorder: Continue QUEtiapine  25 mg at bedtime Depression: Continue Buspar  7.5 mg daily  Constipation: Continue MiraLAX  twice daily, senna daily

## 2024-06-27 NOTE — Inpatient Diabetes Management (Signed)
 Inpatient Diabetes Program Recommendations  AACE/ADA: New Consensus Statement on Inpatient Glycemic Control   Target Ranges:  Prepandial:   less than 140 mg/dL      Peak postprandial:   less than 180 mg/dL (1-2 hours)      Critically ill patients:  140 - 180 mg/dL    Latest Reference Range & Units 06/26/24 06:21 06/26/24 10:36 06/26/24 16:58 06/26/24 21:23 06/27/24 06:38 06/27/24 12:50  Glucose-Capillary 70 - 99 mg/dL 771 (H) 856 (H) 679 (H) 261 (H) 168 (H) 293 (H)   Review of Glycemic Control  Diabetes history: DM2 Outpatient Diabetes medications: None  Current orders for Inpatient glycemic control: Novolog  0-15 units TID with meals and Lantus  8 units daily.   Inpatient Diabetes Program Recommendations:   Insulin : Please consider adding Novolog  3 units TID with meals for meal coverage (if pt eats >50% of meals).   Thanks,  Lavanda Search, RN, MSN, [facility]  Inpatient Diabetes Coordinator  Pager [phone] (8a-5p)

## 2024-06-27 NOTE — Plan of Care (Signed)
 FMTS Interim Progress Note  Spoke with patient regarding concern for irritation with urination and ongoing hematuria.  Reports he is dribbling more urine and does not feel like it is large volume coming out.  States the clots and discomfort are concerning him and he is currently sitting up in the chair shaking all over.  Discussed with patient we will continue with bladder scans and that irritation can be expected with Foley catheter removal especially in the setting of traumatic insertion like he had.  Also discussed he still had hematuria even with the Foley so some bloody output is also expected.  Will keep a close eye on bladder scans and ensure patient not retaining.  If so may require overnight urology consultation given difficulty of Foley placement previously.  Given significant anxiety with hematuria/clots in the setting of known GAD, will give Ativan  1 mg x 1 dose.  Theophilus Pagan, MD 06/27/2024, 10:21 PM PGY-3, [facility] Service pager [phone]

## 2024-06-27 NOTE — Assessment & Plan Note (Signed)
 CVA 4 weeks ago, on ASA prior to admission. ASA then was stopped for ASA washout pre-kidney biopsy (9/5 - 9/10) and continue to hold due  to hematoma/hematuria  - Neurology weighed in on 9/13- hold aspirin  until bleeding resolved  - Continue to hold ASA and SQH  - Continue statin, ezetimibe 

## 2024-06-27 NOTE — Plan of Care (Signed)
 Problem: Education: Goal: Ability to describe self-care measures that may prevent or decrease complications (Diabetes Survival Skills Education) will improve 06/27/2024 0856 by Billee Marybelle Ragena SHAUNNA, RN Outcome: Progressing 06/27/2024 0856 by Billee Marybelle Ragena SHAUNNA, RN Outcome: Progressing 06/27/2024 0855 by Billee Marybelle Ragena SHAUNNA, RN Outcome: Progressing 06/27/2024 0855 by Billee Marybelle Ragena SHAUNNA, RN Outcome: Progressing Goal: Individualized Educational Video(s) 06/27/2024 0856 by Billee Marybelle Ragena SHAUNNA, RN Outcome: Progressing 06/27/2024 0856 by Billee Marybelle Ragena SHAUNNA, RN Outcome: Progressing 06/27/2024 0855 by Billee Marybelle Ragena SHAUNNA, RN Outcome: Progressing 06/27/2024 0855 by Billee Marybelle Ragena SHAUNNA, RN Outcome: Progressing   Problem: Coping: Goal: Ability to adjust to condition or change in health will improve 06/27/2024 0856 by Billee Marybelle Ragena SHAUNNA, RN Outcome: Progressing 06/27/2024 0856 by Billee Marybelle Ragena SHAUNNA, RN Outcome: Progressing 06/27/2024 0855 by Billee Marybelle Ragena SHAUNNA, RN Outcome: Progressing 06/27/2024 0855 by Billee Marybelle Ragena SHAUNNA, RN Outcome: Progressing   Problem: Fluid Volume: Goal: Ability to maintain a balanced intake and output will improve 06/27/2024 0856 by Billee Marybelle Ragena SHAUNNA, RN Outcome: Progressing 06/27/2024 0856 by Billee Marybelle Ragena SHAUNNA, RN Outcome: Progressing 06/27/2024 0855 by Billee Marybelle Ragena SHAUNNA, RN Outcome: Progressing 06/27/2024 0855 by Billee Marybelle Ragena SHAUNNA, RN Outcome: Progressing   Problem: Health Behavior/Discharge Planning: Goal: Ability to identify and utilize available resources and services will improve 06/27/2024 0856 by Billee Marybelle Ragena SHAUNNA, RN Outcome: Progressing 06/27/2024 0856 by Billee Marybelle Ragena SHAUNNA, RN Outcome: Progressing 06/27/2024 0855 by Billee Marybelle Ragena SHAUNNA, RN Outcome: Progressing 06/27/2024 0855 by Billee Marybelle Ragena SHAUNNA, RN Outcome:  Progressing Goal: Ability to manage health-related needs will improve 06/27/2024 0856 by Billee Marybelle Ragena SHAUNNA, RN Outcome: Progressing 06/27/2024 0856 by Billee Marybelle Ragena SHAUNNA, RN Outcome: Progressing 06/27/2024 0855 by Billee Marybelle Ragena SHAUNNA, RN Outcome: Progressing 06/27/2024 0855 by Billee Marybelle Ragena SHAUNNA, RN Outcome: Progressing   Problem: Metabolic: Goal: Ability to maintain appropriate glucose levels will improve 06/27/2024 0856 by Billee Marybelle Ragena SHAUNNA, RN Outcome: Progressing 06/27/2024 0856 by Billee Marybelle Ragena SHAUNNA, RN Outcome: Progressing 06/27/2024 0855 by Billee Marybelle Ragena SHAUNNA, RN Outcome: Progressing 06/27/2024 0855 by Billee Marybelle Ragena SHAUNNA, RN Outcome: Progressing   Problem: Nutritional: Goal: Maintenance of adequate nutrition will improve 06/27/2024 0856 by Billee Marybelle Ragena SHAUNNA, RN Outcome: Progressing 06/27/2024 0856 by Billee Marybelle Ragena SHAUNNA, RN Outcome: Progressing 06/27/2024 0855 by Billee Marybelle Ragena SHAUNNA, RN Outcome: Progressing 06/27/2024 0855 by Billee Marybelle Ragena SHAUNNA, RN Outcome: Progressing Goal: Progress toward achieving an optimal weight will improve 06/27/2024 0856 by Billee Marybelle Ragena SHAUNNA, RN Outcome: Progressing 06/27/2024 0856 by Billee Marybelle Ragena SHAUNNA, RN Outcome: Progressing 06/27/2024 0855 by Billee Marybelle Ragena SHAUNNA, RN Outcome: Progressing 06/27/2024 0855 by Billee Marybelle Ragena SHAUNNA, RN Outcome: Progressing   Problem: Skin Integrity: Goal: Risk for impaired skin integrity will decrease 06/27/2024 0856 by Billee Marybelle Ragena SHAUNNA, RN Outcome: Progressing 06/27/2024 0856 by Billee Marybelle Ragena SHAUNNA, RN Outcome: Progressing 06/27/2024 0855 by Billee Marybelle Ragena SHAUNNA, RN Outcome: Progressing 06/27/2024 0855 by Billee Marybelle Ragena SHAUNNA, RN Outcome: Progressing   Problem: Tissue Perfusion: Goal: Adequacy of tissue perfusion will improve 06/27/2024 0856 by Billee Marybelle Ragena SHAUNNA, RN Outcome:  Progressing 06/27/2024 0856 by Billee Marybelle Ragena SHAUNNA, RN Outcome: Progressing 06/27/2024 0855 by Billee Marybelle Ragena SHAUNNA, RN Outcome: Progressing 06/27/2024 0855 by Billee Marybelle Ragena SHAUNNA, RN Outcome: Progressing   Problem: Education: Goal: Knowledge of General Education information will improve Description: Including pain rating scale, medication(s)/side  effects and non-pharmacologic comfort measures 06/27/2024 0856 by Billee Marybelle Ragena SHAUNNA, RN Outcome: Progressing 06/27/2024 0856 by Billee Marybelle Ragena SHAUNNA, RN Outcome: Progressing 06/27/2024 0855 by Billee Marybelle Ragena SHAUNNA, RN Outcome: Progressing 06/27/2024 0855 by Billee Marybelle Ragena SHAUNNA, RN Outcome: Progressing   Problem: Health Behavior/Discharge Planning: Goal: Ability to manage health-related needs will improve 06/27/2024 0856 by Billee Marybelle Ragena SHAUNNA, RN Outcome: Progressing 06/27/2024 0856 by Billee Marybelle Ragena SHAUNNA, RN Outcome: Progressing 06/27/2024 0855 by Billee Marybelle Ragena SHAUNNA, RN Outcome: Progressing 06/27/2024 0855 by Billee Marybelle Ragena SHAUNNA, RN Outcome: Progressing   Problem: Clinical Measurements: Goal: Ability to maintain clinical measurements within normal limits will improve 06/27/2024 0856 by Billee Marybelle Ragena SHAUNNA, RN Outcome: Progressing 06/27/2024 0856 by Billee Marybelle Ragena SHAUNNA, RN Outcome: Progressing 06/27/2024 0855 by Billee Marybelle Ragena SHAUNNA, RN Outcome: Progressing 06/27/2024 0855 by Billee Marybelle Ragena SHAUNNA, RN Outcome: Progressing Goal: Will remain free from infection 06/27/2024 0856 by Billee Marybelle Ragena SHAUNNA, RN Outcome: Progressing 06/27/2024 0856 by Billee Marybelle Ragena SHAUNNA, RN Outcome: Progressing 06/27/2024 0855 by Billee Marybelle Ragena SHAUNNA, RN Outcome: Progressing 06/27/2024 0855 by Billee Marybelle Ragena SHAUNNA, RN Outcome: Progressing Goal: Diagnostic test results will improve 06/27/2024 0856 by Billee Marybelle Ragena SHAUNNA, RN Outcome: Progressing 06/27/2024  0856 by Billee Marybelle Ragena SHAUNNA, RN Outcome: Progressing 06/27/2024 0855 by Billee Marybelle Ragena SHAUNNA, RN Outcome: Progressing 06/27/2024 0855 by Billee Marybelle Ragena SHAUNNA, RN Outcome: Progressing Goal: Respiratory complications will improve 06/27/2024 0856 by Billee Marybelle Ragena SHAUNNA, RN Outcome: Progressing 06/27/2024 0856 by Billee Marybelle Ragena SHAUNNA, RN Outcome: Progressing 06/27/2024 0855 by Billee Marybelle Ragena SHAUNNA, RN Outcome: Progressing 06/27/2024 0855 by Billee Marybelle Ragena SHAUNNA, RN Outcome: Progressing Goal: Cardiovascular complication will be avoided 06/27/2024 0856 by Billee Marybelle Ragena SHAUNNA, RN Outcome: Progressing 06/27/2024 0856 by Billee Marybelle Ragena SHAUNNA, RN Outcome: Progressing 06/27/2024 0855 by Billee Marybelle Ragena SHAUNNA, RN Outcome: Progressing 06/27/2024 0855 by Billee Marybelle Ragena SHAUNNA, RN Outcome: Progressing   Problem: Activity: Goal: Risk for activity intolerance will decrease 06/27/2024 0856 by Billee Marybelle Ragena SHAUNNA, RN Outcome: Progressing 06/27/2024 0856 by Billee Marybelle Ragena SHAUNNA, RN Outcome: Progressing 06/27/2024 0855 by Billee Marybelle Ragena SHAUNNA, RN Outcome: Progressing 06/27/2024 0855 by Billee Marybelle Ragena SHAUNNA, RN Outcome: Progressing   Problem: Nutrition: Goal: Adequate nutrition will be maintained 06/27/2024 0856 by Billee Marybelle Ragena SHAUNNA, RN Outcome: Progressing 06/27/2024 0856 by Billee Marybelle Ragena SHAUNNA, RN Outcome: Progressing 06/27/2024 0855 by Billee Marybelle Ragena SHAUNNA, RN Outcome: Progressing 06/27/2024 0855 by Billee Marybelle Ragena SHAUNNA, RN Outcome: Progressing   Problem: Coping: Goal: Level of anxiety will decrease 06/27/2024 0856 by Billee Marybelle Ragena SHAUNNA, RN Outcome: Progressing 06/27/2024 0856 by Billee Marybelle Ragena SHAUNNA, RN Outcome: Progressing 06/27/2024 0855 by Billee Marybelle Ragena SHAUNNA, RN Outcome: Progressing 06/27/2024 0855 by Billee Marybelle Ragena SHAUNNA, RN Outcome: Progressing   Problem:  Elimination: Goal: Will not experience complications related to bowel motility 06/27/2024 0856 by Billee Marybelle Ragena SHAUNNA, RN Outcome: Progressing 06/27/2024 0856 by Billee Marybelle Ragena SHAUNNA, RN Outcome: Progressing 06/27/2024 0855 by Billee Marybelle Ragena SHAUNNA, RN Outcome: Progressing 06/27/2024 0855 by Billee Marybelle Ragena SHAUNNA, RN Outcome: Progressing Goal: Will not experience complications related to urinary retention 06/27/2024 0856 by Billee Marybelle Ragena SHAUNNA, RN Outcome: Progressing 06/27/2024 0856 by Billee Marybelle Ragena SHAUNNA, RN Outcome: Progressing 06/27/2024 0855 by Billee Marybelle Ragena SHAUNNA, RN Outcome: Progressing 06/27/2024 0855 by Billee Marybelle Ragena SHAUNNA, RN Outcome: Progressing   Problem: Pain Managment: Goal: General  experience of comfort will improve and/or be controlled 06/27/2024 0856 by Billee Marybelle Ragena SHAUNNA, RN Outcome: Progressing 06/27/2024 0856 by Billee Marybelle Ragena SHAUNNA, RN Outcome: Progressing 06/27/2024 0855 by Billee Marybelle Ragena SHAUNNA, RN Outcome: Progressing 06/27/2024 0855 by Billee Marybelle Ragena SHAUNNA, RN Outcome: Progressing   Problem: Safety: Goal: Ability to remain free from injury will improve 06/27/2024 0856 by Billee Marybelle Ragena SHAUNNA, RN Outcome: Progressing 06/27/2024 0856 by Billee Marybelle Ragena SHAUNNA, RN Outcome: Progressing 06/27/2024 0855 by Billee Marybelle Ragena SHAUNNA, RN Outcome: Progressing 06/27/2024 0855 by Billee Marybelle Ragena SHAUNNA, RN Outcome: Progressing   Problem: Skin Integrity: Goal: Risk for impaired skin integrity will decrease 06/27/2024 0856 by Billee Marybelle Ragena SHAUNNA, RN Outcome: Progressing 06/27/2024 0856 by Billee Marybelle Ragena SHAUNNA, RN Outcome: Progressing 06/27/2024 0855 by Billee Marybelle Ragena SHAUNNA, RN Outcome: Progressing 06/27/2024 0855 by Billee Marybelle Ragena SHAUNNA, RN Outcome: Progressing

## 2024-06-27 NOTE — Plan of Care (Signed)

## 2024-06-27 NOTE — Progress Notes (Addendum)
 Nutrition Follow-up  DOCUMENTATION CODES:   Not applicable  INTERVENTION:   Recommend adjusting insulin  regimen per diabetes coordinator (add Novolog  3 units TID w/ meals for meal coverage)  Nepro Shake po BID, each supplement provides 425 kcal and 19 grams protein  Encouraged adequate PO intake to meet calorie and protein needs  Provided Renal diet education handouts and discussed importance of monitoring sodium, potassium, phosphorus, and protein intake; discussed nutrition therapy on dialysis  Ordered RenaVit daily now that pt will require continued dialysis treatments; checking labs to assess micronutrient status with hx of gastric bypass (ordered CRP, vitamin C, D, B1, B6, B12, folate, copper , zinc )  NUTRITION DIAGNOSIS:   Increased nutrient needs related to acute illness as evidenced by estimated needs. Remains applicable  GOAL:   Patient will meet greater than or equal to 90% of their needs Progressing  MONITOR:   PO intake, Supplement acceptance  REASON FOR ASSESSMENT:   Consult Diet education  ASSESSMENT:   Pt with PMH of ankylosing spondylitis, CKD V, CVA, HTN, DM, HTN, Bipolar 1, recent ischemic stroke d/c'ed 8/13, and migraines who had gastric bypass surgery in 2016 admitted for ankylosing spondylitis flare.  9/3 admitted 9/10 renal biopsy, diagnosed ESRD 9/12 TDC placed by IR; received 1st HD treatment 9/13 2nd HD treatment 9/15 HD treatment  Pt's renal function was not improving following renal biopsy, pt now ESRD and requiring HD treatments. Pt has received 3 HD treatments in patient and will transition to outpatient HD on TTS schedule. Pt has tolerating treatments so far.   Spoke with pt about nutrition therapy not that he is receiving dialysis. Encouraged increased protein intake and discussed need for renal specific multivitamin. Pt's appetite is good and reports he has continued to eat well on dialysis days. Discussed challenges adjusting to  outpatient dialysis schedule and encouraged eating consistent meals on both dialysis and nondialysis days. CBG continue to be uncontrolled, diabetes coordinator recommended beginning insulin  meal coverage of 3 units TID. Pt does not like Glucerna shake, switched to Nepro and will assess acceptability. Assessing micronutrient status now that pt has started dialysis and has hx of gastric bypass, checked vitamin C, D, B1, B6, and B12, folate, zinc , and copper .   Admission weight:122.5 kg  Current weight: 120.1 kg Last admission weight: 125.2 kg (275 lb)   Per outpatient RD notes:  Highest weight: 438 lb 1 year post bariatric surgery (2017): 309 lb - total weight loss 128 lb   Medications reviewed and include:  Aranesp  weekly injection Ferrou suslfate 325 mg SSI Novolog  TID Lantus  8 units  Protonix  Miralax  Senna  Labs reviewed:  CBG x 24 hr: 143-320 mg/dL Potassium 4.4 Sodium 861 BUN 80/Creatinine 5.06 (high) Phosphorus 4.3  Diet Order:   Diet Order             Diet renal with fluid restriction Fluid restriction: 1200 mL Fluid; Room service appropriate? Yes; Fluid consistency: Thin  Diet effective now                   EDUCATION NEEDS:   Not appropriate for education at this time  Skin:  Skin Assessment: Reviewed RN Assessment  Last BM:  9/15 type 5  Height:   Ht Readings from Last 1 Encounters:  06/15/24 6' (1.829 m)    Weight:   Wt Readings from Last 1 Encounters:  06/27/24 120.1 kg    BMI:  Body mass index is 35.91 kg/m.  Estimated Nutritional Needs:   Kcal:  2100-2300  Protein:  100-115 grams  Fluid:  1L + UOP    Josette Glance, MS, RDN, LDN Clinical Dietitian I Please reach out via secure chat

## 2024-06-27 NOTE — Progress Notes (Signed)
 [facility] NEPHROLOGY PROGRESS NOTE  Assessment/ Plan: Pt is a 53 y.o. yo male    # New ESRD - Patient with CKD5 progressed to new ESRD due to biopsy proven advanced diabetic glomerulosclerosis class IV, severe arterionephrosclerosis, secondary FSGS with severe interstitial fibrosis and tubular atrophy.  Rapidly progressive CKD.  UPCR almost 14 g. OP labs including ANCA, ASO, complement level, anti dsDNA ab, Hep B, Hep C, anti-GBM, anti-PLA2R ab negative. He is s/p kidney biopsy on 9/10 with DM and significant scarring as above.  S/P RIJ TDC followed by first HD on 9/12. --------------------- - HD toady  - Will follow-up with HD SW/renal navigator regarding outpatient dialysis.  - In the long-term he is interested in home therapy and kidney transplant evaluation. - Will consult vascular surgery re: AVF and order vein mapping  - Would recommend an alternative to buspirone  per primary team (contraindicated in severe renal failure)  # Bleeding from HD catheter site requiring prolonged pressure by the bedside nurse.  IR evaluated.  S/p DDAVP    # Gross hematuria after kidney biopsy:  - Reviewed kidney ultrasound results.  Seen by urology.   - Foley catheter was placed and on CBI.   - Hematuria seems to be clearing up.  #Anemia of CKD and acute blood loss anemia due to hematuria. - Iron sat 51 %, received Aranesp  on 9/10, worsening anemia due to hematuria/acute blood loss.  Will schedule aranesp  60 mcg every Wednesday for now  - Transfuse PRBC as needed.   #Ankylosing spondylitis - With flare - Per primary, on prednisone  with dose taper per team    #Hyperkalemia - Renal diet - s/p Lokelma  and now on dialysis.  # Metabolic acidosis - discontinue sodium bicarbonate , now HD.   #Hypertension - BP currently acceptable on amlodipine  and carvedilol . - avoid hypotension  Disposition - continue inpatient monitoring      Subjective:  Last HD on 9/13 with 1 kg UF.  He  had 8.3 L UOP charted over 9/14 however note that he has been on CBI so not accurate.  Nurse held pressure yesterday for bleeding at tunneled catheter site; he also got DDAVP .  IR came to assess him and bleeding had stopped at that time.  He feels ok.  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  Wants to do whatever would be the least burden on his family.   He is comfortable with me consulting vascular surgery for an AV fistula.  He has had a gastric bypass in the past.    Review of systems:  Denies shortness of breath or chest pain  Denies n/v   Objective Vital signs in last 24 hours: Vitals:   06/26/24 0916 06/26/24 1624 06/26/24 1658 06/26/24 2152  BP: (!) 149/76 133/69 125/69 123/70  Pulse: 67 70 70 63  Resp:  16 16 18   Temp:  98.3 F (36.8 C) 98.2 F (36.8 C) 98.6 F (37 C)  TempSrc:  Oral    SpO2:  96% 94% 95%  Weight:      Height:       Weight change:   Intake/Output Summary (Last 24 hours) at 06/27/2024 0907 Last data filed at 06/27/2024 0715 Gross per 24 hour  Intake 6300 ml  Output 8125 ml  Net -1825 ml       Labs: RENAL PANEL Recent Labs  Lab 06/21/24 0351 06/22/24 0510 06/23/24 0500 06/24/24 0457 06/25/24 0425 06/26/24 0312 06/27/24 0556  NA 131* 137 134* 135 135 134* 138  K  5.2* 5.0 5.2* 5.4* 5.0 3.8 4.4  CL 104 104 103 106 102 100 101  CO2 17* 20* 19* 19* 20* 24 25  GLUCOSE 227* 196* 183* 179* 260* 252* 177*  BUN 125* 130* 138* 149* 113* 72* 80*  CREATININE 5.17* 4.89* 5.45* 6.35* 5.90* 4.27* 5.06*  CALCIUM  7.7* 7.8* 7.7* 7.4* 7.4* 7.4* 7.7*  PHOS 5.3* 4.8* 5.7*  --   --  3.3 4.3  ALBUMIN 2.0* 2.1* 2.1* 2.1*  --  2.0* 2.2*    Liver Function Tests: Recent Labs  Lab 06/24/24 0457 06/26/24 0312 06/27/24 0556  ALBUMIN 2.1* 2.0* 2.2*   No results for input(s): LIPASE, AMYLASE in the last 168 hours. No results for input(s): AMMONIA in the last 168 hours. CBC: Recent Labs    03/21/24 1037 05/09/24 1159 06/19/24 0437 06/22/24 0510 06/25/24 1446  06/25/24 2022 06/26/24 0312 06/26/24 1615 06/27/24 0556  HGB 11.3*   < >  --    < > 8.3* 8.8* 7.5* 8.1* 8.1*  MCV 93   < >  --    < > 87.7 88.1 87.2 88.7 89.1  FERRITIN 60  --  120  --   --   --   --   --   --   TIBC 298  --  209*  --   --   --   --   --   --   IRON 52  --  107  --   --   --   --   --   --    < > = values in this interval not displayed.    Cardiac Enzymes: No results for input(s): CKTOTAL, CKMB, CKMBINDEX, TROPONINI in the last 168 hours.  CBG: Recent Labs  Lab 06/26/24 0621 06/26/24 1036 06/26/24 1658 06/26/24 2123 06/27/24 0638  GLUCAP 228* 143* 320* 261* 168*    Iron Studies:  No results for input(s): IRON, TIBC, TRANSFERRIN, FERRITIN in the last 72 hours.  Studies/Results: No results found.   Medications: Infusions:  sodium chloride  irrigation      Scheduled Medications:  acetaminophen   650 mg Oral Q8H   amLODipine   10 mg Oral Daily   atorvastatin   40 mg Oral Daily   busPIRone   7.5 mg Oral BID   carvedilol   3.125 mg Oral BID   Chlorhexidine  Gluconate Cloth  6 each Topical Q0600   ezetimibe   10 mg Oral Daily   feeding supplement (GLUCERNA SHAKE)  237 mL Oral TID BM   ferrous sulfate   325 mg Oral QODAY   insulin  aspart  0-9 Units Subcutaneous TID WC   insulin  glargine  8 Units Subcutaneous Daily   lidocaine   2 patch Transdermal Q24H   pantoprazole   40 mg Oral Daily   polyethylene glycol  17 g Oral BID   predniSONE   10 mg Oral Q breakfast   QUEtiapine   25 mg Oral QHS   senna  2 tablet Oral Daily   tamsulosin   0.4 mg Oral Daily    have reviewed scheduled and prn medications.  Physical Exam:   General adult male in bed in no acute distress HEENT normocephalic atraumatic extraocular movements intact sclera anicteric Neck supple trachea midline Lungs clear to auscultation bilaterally normal work of breathing at rest  Heart S1S2 no rub Abdomen soft nontender nondistended Extremities no edema  Psych normal mood and  affect Neuro - alert and oriented x 3 provides hx and follows commands  GU - foley in place with hematuria  Access  RIJ tunneled catheter - no bleeding noted     Katheryn [name] 06/27/2024,9:38 AM  LOS: 11 days

## 2024-06-28 ENCOUNTER — Inpatient Hospital Stay (HOSPITAL_COMMUNITY): Payer: PRIVATE HEALTH INSURANCE

## 2024-06-28 DIAGNOSIS — Z992 Dependence on renal dialysis: Secondary | ICD-10-CM | POA: Diagnosis not present

## 2024-06-28 DIAGNOSIS — N186 End stage renal disease: Secondary | ICD-10-CM | POA: Diagnosis not present

## 2024-06-28 DIAGNOSIS — M459 Ankylosing spondylitis of unspecified sites in spine: Secondary | ICD-10-CM | POA: Diagnosis not present

## 2024-06-28 DIAGNOSIS — R339 Retention of urine, unspecified: Secondary | ICD-10-CM | POA: Insufficient documentation

## 2024-06-28 DIAGNOSIS — R310 Gross hematuria: Secondary | ICD-10-CM | POA: Diagnosis not present

## 2024-06-28 LAB — CBC WITH DIFFERENTIAL/PLATELET
Abs Immature Granulocytes: 0.35 K/uL — ABNORMAL HIGH (ref 0.00–0.07)
Basophils Absolute: 0 K/uL (ref 0.0–0.1)
Basophils Relative: 0 %
Eosinophils Absolute: 0.3 K/uL (ref 0.0–0.5)
Eosinophils Relative: 2 %
HCT: 26.5 % — ABNORMAL LOW (ref 39.0–52.0)
Hemoglobin: 8.4 g/dL — ABNORMAL LOW (ref 13.0–17.0)
Immature Granulocytes: 2 %
Lymphocytes Relative: 8 %
Lymphs Abs: 1.2 K/uL (ref 0.7–4.0)
MCH: 28.8 pg (ref 26.0–34.0)
MCHC: 31.7 g/dL (ref 30.0–36.0)
MCV: 90.8 fL (ref 80.0–100.0)
Monocytes Absolute: 1.1 K/uL — ABNORMAL HIGH (ref 0.1–1.0)
Monocytes Relative: 7 %
Neutro Abs: 12.1 K/uL — ABNORMAL HIGH (ref 1.7–7.7)
Neutrophils Relative %: 81 %
Platelets: 310 K/uL (ref 150–400)
RBC: 2.92 MIL/uL — ABNORMAL LOW (ref 4.22–5.81)
RDW: 15.9 % — ABNORMAL HIGH (ref 11.5–15.5)
WBC: 15 K/uL — ABNORMAL HIGH (ref 4.0–10.5)
nRBC: 0 % (ref 0.0–0.2)

## 2024-06-28 LAB — RENAL FUNCTION PANEL
Albumin: 2.1 g/dL — ABNORMAL LOW (ref 3.5–5.0)
Albumin: 2.1 g/dL — ABNORMAL LOW (ref 3.5–5.0)
Anion gap: 11 (ref 5–15)
Anion gap: 10 (ref 5–15)
BUN: 48 mg/dL — ABNORMAL HIGH (ref 6–20)
BUN: 52 mg/dL — ABNORMAL HIGH (ref 6–20)
CO2: 27 mmol/L (ref 22–32)
CO2: 27 mmol/L (ref 22–32)
Calcium: 8 mg/dL — ABNORMAL LOW (ref 8.9–10.3)
Calcium: 8.1 mg/dL — ABNORMAL LOW (ref 8.9–10.3)
Chloride: 100 mmol/L (ref 98–111)
Chloride: 99 mmol/L (ref 98–111)
Creatinine, Ser: 3.99 mg/dL — ABNORMAL HIGH (ref 0.61–1.24)
Creatinine, Ser: 4.44 mg/dL — ABNORMAL HIGH (ref 0.61–1.24)
GFR, Estimated: 17 mL/min — ABNORMAL LOW (ref >60)
GFR, Estimated: 15 mL/min — ABNORMAL LOW (ref >60)
Glucose, Bld: 110 mg/dL — ABNORMAL HIGH (ref 70–99)
Glucose, Bld: 208 mg/dL — ABNORMAL HIGH (ref 70–99)
Phosphorus: 3.9 mg/dL (ref 2.5–4.6)
Phosphorus: 4 mg/dL (ref 2.5–4.6)
Potassium: 4 mmol/L (ref 3.5–5.1)
Potassium: 4.6 mmol/L (ref 3.5–5.1)
Sodium: 138 mmol/L (ref 135–145)
Sodium: 136 mmol/L (ref 135–145)

## 2024-06-28 LAB — HEMOGLOBIN AND HEMATOCRIT, BLOOD
HCT: 29.2 % — ABNORMAL LOW (ref 39.0–52.0)
Hemoglobin: 9.2 g/dL — ABNORMAL LOW (ref 13.0–17.0)

## 2024-06-28 LAB — CBC
HCT: 24.8 % — ABNORMAL LOW (ref 39.0–52.0)
Hemoglobin: 7.8 g/dL — ABNORMAL LOW (ref 13.0–17.0)
MCH: 28.2 pg (ref 26.0–34.0)
MCHC: 31.5 g/dL (ref 30.0–36.0)
MCV: 89.5 fL (ref 80.0–100.0)
Platelets: 295 K/uL (ref 150–400)
RBC: 2.77 MIL/uL — ABNORMAL LOW (ref 4.22–5.81)
RDW: 15.7 % — ABNORMAL HIGH (ref 11.5–15.5)
WBC: 14.8 K/uL — ABNORMAL HIGH (ref 4.0–10.5)
nRBC: 0.1 % (ref 0.0–0.2)

## 2024-06-28 LAB — GLUCOSE, CAPILLARY
Glucose-Capillary: 104 mg/dL — ABNORMAL HIGH (ref 70–99)
Glucose-Capillary: 204 mg/dL — ABNORMAL HIGH (ref 70–99)
Glucose-Capillary: 125 mg/dL — ABNORMAL HIGH (ref 70–99)
Glucose-Capillary: 194 mg/dL — ABNORMAL HIGH (ref 70–99)

## 2024-06-28 MED ORDER — HEPARIN SODIUM (PORCINE) 1000 UNIT/ML IJ SOLN
INTRAMUSCULAR | Status: AC
Start: 2024-06-28 — End: 2024-06-28
  Filled 2024-06-28: qty 2

## 2024-06-28 MED ORDER — HEPARIN SODIUM (PORCINE) 1000 UNIT/ML IJ SOLN
INTRAMUSCULAR | Status: AC
  Filled 2024-06-28: qty 5

## 2024-06-28 MED ORDER — ANTICOAGULANT SODIUM CITRATE 4% (200MG/5ML) IV SOLN: 5.0000 mL | Status: DC | PRN

## 2024-06-28 MED ORDER — LIDOCAINE HCL URETHRAL/MUCOSAL 2 % EX GEL
1.0000 | CUTANEOUS | Status: AC
  Administered 2024-06-28: 1 via TOPICAL
  Filled 2024-06-28: qty 6

## 2024-06-28 MED ORDER — LIDOCAINE HCL (PF) 1 % IJ SOLN: 5.0000 mL | INTRAMUSCULAR | Status: DC | PRN

## 2024-06-28 MED ORDER — LIDOCAINE-PRILOCAINE 2.5-2.5 % EX CREA: 1.0000 | TOPICAL_CREAM | CUTANEOUS | Status: DC | PRN

## 2024-06-28 MED ORDER — PENTAFLUOROPROP-TETRAFLUOROETH EX AERO: 1.0000 | INHALATION_SPRAY | CUTANEOUS | Status: DC | PRN

## 2024-06-28 MED ORDER — QUETIAPINE FUMARATE 50 MG PO TABS
50.0000 mg | ORAL_TABLET | Freq: Every day | ORAL | Status: DC
  Administered 2024-06-28 – 2024-07-01 (×4): 50 mg via ORAL
  Filled 2024-06-28 (×3): qty 1
  Filled 2024-06-28: qty 2

## 2024-06-28 MED ORDER — ALTEPLASE 2 MG IJ SOLR: 2.0000 mg | Freq: Once | INTRAMUSCULAR | Status: DC | PRN

## 2024-06-28 MED ORDER — HEPARIN SODIUM (PORCINE) 1000 UNIT/ML DIALYSIS: 1000.0000 [IU] | INTRAMUSCULAR | Status: DC | PRN

## 2024-06-28 MED ORDER — IOHEXOL 350 MG/ML SOLN
100.0000 mL | Freq: Once | INTRAVENOUS | Status: AC | PRN
  Administered 2024-06-28: 100 mL via INTRAVENOUS

## 2024-06-28 NOTE — Progress Notes (Signed)
 PT Cancellation Note  Patient Details Name: Edward [name] MRN: [id number] DOB: 12-12-1970   Cancelled Treatment:    Reason Eval/Treat Not Completed: Fatigue/lethargy limiting ability to participate.  11:49 am Pt declined PT treatment session 2 fatigue. States he wants to rest but agreeable to PT checking back this afternoon for session.   2:54 pm PT checked back on pt and he is currently off unit for HD. Will check back as schedule allows to continue with PT POC.    Leita JONETTA Sable 06/28/2024, 2:54 PM  Leita Sable, PT, DPT Acute Rehabilitation Services Secure Chat Preferred Office: [phone]

## 2024-06-28 NOTE — Progress Notes (Addendum)
 Daily Progress Note Intern Pager: [phone]  Patient name: Edward [name] Medical record number: [id number] Date of birth: 1971/02/01 Age: 53 y.o. Gender: male  Primary Care Provider: Donzetta Rollene BRAVO, MD Consultants: Nephrology, urology, IR, Vascular surgery Code Status: Full code  Pt Overview and Major Events to Date:  9/4 : Admitted for back pain  9/11 : Renal biopsy with IR  9/12: TDC placed by IR, HD initiated 9/16 : Failed void trail. Place foley   Assessment and Plan: 53 year old male with PMHx ankylosing spondylitis, CKDV, CVA, HTN, T2DM and, HLD, bipolar 1 and migraines presenting with back pain in the setting of flare of ankylosing spondylitis . Patient undergone Renal Biopsy on 06/22/24 w/ IR and initiated on HD per nephrology. His hospitalization c/b urinary retention required in and out cath then foley following by hematuria.He failed void trial on 9/16 and place foley back. Renal US  on 9/11 shows perirenal hemorrhage s/p renal biopsy and bladder hematoma. In addition, his kidney fx has also become worsening now on iHD.  Assessment & Plan Ankylosing spondylitis flare Well-controlled on PO pain regimen. - Continue prednisone  taper - Pain regimen: - Acetaminophen  650 mg every 8 hours scheduled - Oxycodone  5-10 mg every 4 hours as needed for moderate/severe pain - Lidocaine  patches - Tizanidine  4 mg every 8 hours as needed - Need outpatient f/u with Rheumatology  - PT/OT follow  ESRD (end stage renal disease) on dialysis (HCC) On PRN iHD per Nephrology due to no sign of renal recovery.  TDC placed by IR 9/12.  - Nephrology consultation, appreciate recommendations Consult vascular surgery re: AVF - Appreciate vascular surgery NPO after MN for Fistula VS Graft w/ vascular tmrw HD today - get on schedule (and also note plans for surgery tomorrow).  Then will be TTS He has been accepted for outpatient HD TTS at [facility]   In the longer-term, he is interested in home  therapy and kidney transplant evaluation. - Renal diet with 1200 ml fluid restriction  - Strict I's and O's - Electrolyte management as indicated Gross hematuria Urinary retention Stable H/H w/o blood transfusion. Improved hematuria from upper tract bleeding. Not a candidate for TXA given recent CVA.  If worsening, consider IR embolization. Fail void trial on 9/16. Place new foley w/ Caude 53F w/ 1050 ml dark red bloody urine output - Urology consult, appreciate recommendations - Continue foley  - Strict I&O - Monitor CBC PM and UOP - Continue Flomax  0.4 mg Daily H/O: CVA (cerebrovascular accident) CVA 4 weeks ago, on ASA prior to admission. ASA then was stopped for ASA washout pre-kidney biopsy (9/5 - 9/10) and continue to hold due  to hematoma/hematuria  - Neurology weighed in on 9/13- hold aspirin  until bleeding resolved  - Continue to hold ASA and SQH d/t remaining w/ blooding urine after replaced foley - Continue statin, ezetimibe    Chronic health problem HTN: Continue home amLODipine  10 mg daily, Carvedilol  3.125 mg BID DMII: sSSI, Glargine 8 units started 9/14. Will plan on keep his insulin  as is for now as he will completed his Prednisolone treatment today HLD: Continue Ezetimibe  10 mg daily Bipolar 1 disorder: Increase QUEtiapine  25 mg to 50 mg at bedtime since buspirone  is now stopped. Will repeat EKG tmrw morning Depression: Stopped Buspar  7.5 mg daily  Constipation: Continue MiraLAX  twice daily, senna daily  FEN/GI: Renal fluid restriction  PPx: SCDs, holding anticoagulation in the setting of hematuria Dispo: Home pending hematuria improvement HD chair placement  Subjective:  Edward [name] w/ severe pain 10/10 during round this morning. He states he start has pain in lower abdomen since last night. Patient was very anxious this morning due to her lower abdominal pain. Bladder scan shows greater than 1L of urine. Foley was placed w/ 1050 ml output. Patient states pain  significantly improved after foley placement  Objective: Temp:  [97.6 F (36.4 C)-98.2 F (36.8 C)] 97.6 F (36.4 C) (09/16 0758) Pulse Rate:  [58-74] 60 (09/16 0758) Resp:  [10-18] 15 (09/16 0758) BP: (112-145)/(60-94) 134/66 (09/16 0758) SpO2:  [95 %-100 %] 97 % (09/16 0758) Weight:  [119.1 kg-120.1 kg] 119.1 kg (09/15 1648) Physical Exam: General: Sitting up in bed, NAD Cardiovascular: RRR without murmur Respiratory: CTAB.  Normal work of breathing on room air Abdomen: Soft, nontender, nondistended.  Foley catheter in place draining blood-tinged urine, Last BM 06/25/24 Extremities: No peripheral edema Vas cath : dressing w/ saturated blood GU : foley w/ clear dark reddish urine output  Laboratory: Most recent CBC Lab Results  Component Value Date   WBC 14.8 (H) 06/28/2024   HGB 7.8 (L) 06/28/2024   HCT 24.8 (L) 06/28/2024   MCV 89.5 06/28/2024   PLT 295 06/28/2024   Most recent BMP    Latest Ref Rng & Units 06/28/2024    3:51 AM  BMP  Glucose 70 - 99 mg/dL 889   BUN 6 - 20 mg/dL 48   Creatinine 9.38 - 1.24 mg/dL 6.00   Sodium 864 - 854 mmol/L 138   Potassium 3.5 - 5.1 mmol/L 4.0   Chloride 98 - 111 mmol/L 100   CO2 22 - 32 mmol/L 27   Calcium  8.9 - 10.3 mg/dL 8.0     Other pertinent labs: Hepatitis B surface antigen: Nonreactive  Imaging/Diagnostic Tests: IR TUNNELED CENTRAL VENOUS CATH [facility] W IMG Result Date: 06/24/2024 IMPRESSION: Successful placement of a right jugular tunneled dialysis catheter using ultrasound and fluoroscopic guidance. Electronically Signed   By: Juliene Balder M.D.   On: 06/24/2024 17:31     Edward Houston NOVAK, DO 06/28/2024, 9:41 AM  PGY-1, [facility] Health Family Medicine FPTS Intern pager: [phone], text pages welcome Secure chat group [facility] Teaching Service

## 2024-06-28 NOTE — Progress Notes (Signed)
 Navigator will need to f/u with [facility] admissions tomorrow to confirm that pt is able to start at clinic on Thursday due to pt's insurance. Navigator has attempted to get confirmation and clarification today but has not been successful at receiving info back from [facility] admissions. Update provided to nephrologist as well. Will assist.   Randine Mungo Dialysis Navigator [phone]

## 2024-06-28 NOTE — Assessment & Plan Note (Addendum)
 CVA 4 weeks ago, on ASA prior to admission. ASA then was stopped for ASA washout pre-kidney biopsy (9/5 - 9/10) and continue to hold due  to hematoma/hematuria  - Neurology weighed in on 9/13- hold aspirin  until bleeding resolved  - Continue to hold ASA and SQH d/t remaining w/ blooding urine after replaced foley - Continue statin, ezetimibe 

## 2024-06-28 NOTE — Assessment & Plan Note (Addendum)
 Stable H/H w/o blood transfusion. Improved hematuria from upper tract bleeding. Not a candidate for TXA given recent CVA.  If worsening, consider IR embolization. Fail void trial on 9/16. Place new foley w/ Caude 15F w/ 1050 ml dark red bloody urine output - Urology consult, appreciate recommendations - Continue foley  - Strict I&O - Monitor CBC PM and UOP - Continue Flomax  0.4 mg Daily

## 2024-06-28 NOTE — Progress Notes (Addendum)
 [name] PROGRESS NOTE  Assessment/ Plan: Pt is a 53 y.o. yo male    # New ESRD - Patient with CKD5 progressed to new ESRD due to biopsy proven advanced diabetic glomerulosclerosis class IV, severe arterionephrosclerosis, secondary FSGS with severe interstitial fibrosis and tubular atrophy.  Rapidly progressive CKD.  UPCR almost 14 g. OP labs including ANCA, ASO, complement level, anti dsDNA ab, Hep B, Hep C, anti-GBM, anti-PLA2R ab negative. He is s/p [name] biopsy on 9/10 with DM and significant scarring as above.  S/P RIJ TDC followed by first HD on 9/12. --------------------- - HD today - get on schedule (and also note plans for surgery tomorrow).  Then will be TTS - He has been accepted for outpatient HD TTS at [facility]   - In the longer-term, he is interested in home therapy and [name] transplant evaluation. - Appreciate vascular surgery - they plan for AVF vs. AV graft tomorrow  # Bleeding from HD catheter site requiring prolonged pressure by the bedside nurse.  IR evaluated.  S/p DDAVP  and resolved  # Gross hematuria after [name] biopsy:  - Reviewed [name] ultrasound results.  Seen by urology.   - Foley catheter was placed and s/p CBI.   - Now with foley catheter which was replaced - Urology is determining if he needs additional imaging to include contrast.  If contrast is needed to stop ongoing bleeding, this is alright from a renal standpoint.  Appreciate urology  #Anemia of CKD and acute blood loss anemia due to hematuria. - Iron sat 51 %, received Aranesp  on 9/10, worsening anemia due to hematuria/acute blood loss.   - Scheduled aranesp  60 mcg every Wednesday for now  - Transfuse PRBC as needed.   #Ankylosing spondylitis - With flare - Per primary, on prednisone  with dose taper per team    #Hyperkalemia - Renal diet - s/p Lokelma  and now on dialysis.  # Metabolic acidosis - discontinue sodium bicarbonate , now HD.   #Hypertension - BP  currently acceptable on current regimen - avoid hypotension  Disposition - continue inpatient monitoring.  Plans for AVF vs. AV graft tomorrow with vascular surgery and note urology is working up bleeding as well     Addendum - I have received a message from HD SW that patient may not be financially cleared to start outpatient HD - they are working on getting this clarified but he would not be able to be discharged without an HD unit   Edward [name], MD 3:02 PM 06/28/2024     Subjective:  Last HD on 9/15 with 1.5 kg UF.  He had 1.8 L UOP charted over 9/15.  Vascular surgery saw him and plans for AVF vs. Graft tomorrow.  Note he has had a gastric bypass in the past.  Foley was removed yesterday and later replaced this AM after recurrent retention.  Continues with hematuria.  Spoke with urology and they may need to do a CT angiogram  Review of systems:    Denies shortness of breath or chest pain  Denies n/v   Objective Vital signs in last 24 hours: Vitals:   06/27/24 1648 06/27/24 2029 06/28/24 0231 06/28/24 0758  BP: (!) 141/76 131/69 116/74 134/66  Pulse: 63 74 60 60  Resp: 13 18 16 15   Temp: 98.1 F (36.7 C) 98.2 F (36.8 C) 97.6 F (36.4 C) 97.6 F (36.4 C)  TempSrc:  Oral Oral Oral  SpO2: 100% 98% 100% 97%  Weight: 119.1 kg  Height:       Weight change:   Intake/Output Summary (Last 24 hours) at 06/28/2024 1107 Last data filed at 06/28/2024 0900 Gross per 24 hour  Intake 240 ml  Output 2650 ml  Net -2410 ml     Labs: RENAL PANEL Recent Labs  Lab 06/22/24 0510 06/23/24 0500 06/24/24 0457 06/25/24 0425 06/26/24 0312 06/27/24 0556 06/28/24 0351  NA 137 134* 135 135 134* 138 138  K 5.0 5.2* 5.4* 5.0 3.8 4.4 4.0  CL 104 103 106 102 100 101 100  CO2 20* 19* 19* 20* 24 25 27   GLUCOSE 196* 183* 179* 260* 252* 177* 110*  BUN 130* 138* 149* 113* 72* 80* 48*  CREATININE 4.89* 5.45* 6.35* 5.90* 4.27* 5.06* 3.99*  CALCIUM  7.8* 7.7* 7.4* 7.4* 7.4* 7.7* 8.0*   PHOS 4.8* 5.7*  --   --  3.3 4.3 3.9  ALBUMIN 2.1* 2.1* 2.1*  --  2.0* 2.2* 2.1*    Liver Function Tests: Recent Labs  Lab 06/26/24 0312 06/27/24 0556 06/28/24 0351  ALBUMIN 2.0* 2.2* 2.1*   No results for input(s): LIPASE, AMYLASE in the last 168 hours. No results for input(s): AMMONIA in the last 168 hours. CBC: Recent Labs    03/21/24 1037 05/09/24 1159 06/19/24 0437 06/22/24 0510 06/25/24 2022 06/26/24 0312 06/26/24 1615 06/27/24 0556 06/27/24 2052 06/28/24 0351  HGB 11.3*   < >  --    < > 8.8* 7.5* 8.1* 8.1*  --  7.8*  MCV 93   < >  --    < > 88.1 87.2 88.7 89.1  --  89.5  VITAMINB12  --   --   --   --   --   --   --   --  518  --   FOLATE  --   --   --   --   --   --   --   --  12.8  --   FERRITIN 60  --  120  --   --   --   --   --   --   --   TIBC 298  --  209*  --   --   --   --   --   --   --   IRON 52  --  107  --   --   --   --   --   --   --    < > = values in this interval not displayed.    Cardiac Enzymes: No results for input(s): CKTOTAL, CKMB, CKMBINDEX, TROPONINI in the last 168 hours.  CBG: Recent Labs  Lab 06/27/24 0638 06/27/24 1250 06/27/24 1740 06/27/24 2104 06/28/24 0613  GLUCAP 168* 293* 136* 257* 104*    Iron Studies:  No results for input(s): IRON, TIBC, TRANSFERRIN, FERRITIN in the last 72 hours.  Studies/Results: VAS US  UPPER EXT VEIN MAPPING (PRE-OP  AVF) Result Date: 06/27/2024 UPPER EXTREMITY VEIN MAPPING Patient Name:  Edward [name]  Date of Exam:   06/27/2024 Medical Rec #: [id number]      Accession #:    [id number] Date of Birth: 1971/08/20      Patient Gender: M Patient Age:   60 years Exam Location:  [facility] Procedure:      VAS US  UPPER EXT VEIN MAPPING (PRE-OP  AVF) Referring Phys: Edward [name] --------------------------------------------------------------------------------  Indications: Pre-access. History: ESRD. CKDV, CVA, HTN, DM2, HLD.  Comparison Study: No prior study. Performing  Technologist: Edilia Najjar  [place]  Examination Guidelines: A complete evaluation includes B-mode imaging, spectral Doppler, color Doppler, and power Doppler as needed of all accessible portions of each vessel. Bilateral testing is considered an integral part of a complete examination. Limited examinations for reoccurring indications may be performed as noted. +-----------------+-------------+----------+-----------------------+ Right Cephalic   Diameter (cm)Depth (cm)       Findings         +-----------------+-------------+----------+-----------------------+ Shoulder             0.50        1.70                           +-----------------+-------------+----------+-----------------------+ Prox upper arm       0.52        0.87                           +-----------------+-------------+----------+-----------------------+ Mid upper arm        0.53        0.29                           +-----------------+-------------+----------+-----------------------+ Dist upper arm       0.42        0.52   branching and Thrombus. +-----------------+-------------+----------+-----------------------+ Antecubital fossa    0.49        0.40                           +-----------------+-------------+----------+-----------------------+ Prox forearm         0.35        0.49                           +-----------------+-------------+----------+-----------------------+ Mid forearm          0.35        0.25                           +-----------------+-------------+----------+-----------------------+ Dist forearm         0.40        0.16                           +-----------------+-------------+----------+-----------------------+ Wrist                0.29        0.18                           +-----------------+-------------+----------+-----------------------+ +-----------------+-------------+----------+---------+ Right Basilic    Diameter (cm)Depth (cm)Findings   +-----------------+-------------+----------+---------+ Prox upper arm       0.47                         +-----------------+-------------+----------+---------+ Mid upper arm        0.50                         +-----------------+-------------+----------+---------+ Dist upper arm       0.45               branching +-----------------+-------------+----------+---------+ Antecubital fossa    0.25                         +-----------------+-------------+----------+---------+  Prox forearm         0.30                         +-----------------+-------------+----------+---------+ Mid forearm          0.29                         +-----------------+-------------+----------+---------+ Distal forearm       0.23               branching +-----------------+-------------+----------+---------+ Elbow                0.33                         +-----------------+-------------+----------+---------+ Wrist                0.25                         +-----------------+-------------+----------+---------+ +-----------------+-------------+----------+---------+ Left Cephalic    Diameter (cm)Depth (cm)Findings  +-----------------+-------------+----------+---------+ Shoulder             0.50        1.36             +-----------------+-------------+----------+---------+ Prox upper arm       0.52        0.48             +-----------------+-------------+----------+---------+ Mid upper arm        0.54        0.27   branching +-----------------+-------------+----------+---------+ Dist upper arm       0.52        0.51   branching +-----------------+-------------+----------+---------+ Antecubital fossa    0.32        0.25             +-----------------+-------------+----------+---------+ Prox forearm         0.37        0.54   branching +-----------------+-------------+----------+---------+ Mid forearm          0.37        0.31              +-----------------+-------------+----------+---------+ Dist forearm         0.27        0.16             +-----------------+-------------+----------+---------+ Wrist                0.27        0.22             +-----------------+-------------+----------+---------+ +-----------------+-------------+----------+---------+ Left Basilic     Diameter (cm)Depth (cm)Findings  +-----------------+-------------+----------+---------+ Prox upper arm       0.55                         +-----------------+-------------+----------+---------+ Mid upper arm        0.57                         +-----------------+-------------+----------+---------+ Dist upper arm       0.40               branching +-----------------+-------------+----------+---------+ Antecubital fossa    0.34                         +-----------------+-------------+----------+---------+ Prox forearm  0.21                         +-----------------+-------------+----------+---------+ Mid forearm          0.17                         +-----------------+-------------+----------+---------+ Distal forearm       0.15                         +-----------------+-------------+----------+---------+ Elbow                0.31                         +-----------------+-------------+----------+---------+ Wrist                0.26                         +-----------------+-------------+----------+---------+ *See table(s) above for measurements and observations.  Diagnosing physician: Lonni Gaskins MD Electronically signed by Lonni Gaskins MD on 06/27/2024 at 1:34:10 PM.    Final      Medications: Infusions:  sodium chloride  irrigation      Scheduled Medications:  acetaminophen   650 mg Oral Q8H   amLODipine   10 mg Oral Daily   atorvastatin   40 mg Oral Daily   carvedilol   3.125 mg Oral BID   Chlorhexidine  Gluconate Cloth  6 each Topical Q0600   [START ON 06/29/2024] darbepoetin (ARANESP ) injection  - DIALYSIS  60 mcg Subcutaneous Q Wed-1800   ezetimibe   10 mg Oral Daily   feeding supplement (NEPRO CARB STEADY)  237 mL Oral BID   ferrous sulfate   325 mg Oral QODAY   insulin  aspart  0-15 Units Subcutaneous TID WC   insulin  glargine  8 Units Subcutaneous Daily   lidocaine   2 patch Transdermal Q24H   multivitamin  1 tablet Oral QHS   pantoprazole   40 mg Oral Daily   polyethylene glycol  17 g Oral BID   QUEtiapine   25 mg Oral QHS   senna  2 tablet Oral Daily   tamsulosin   0.4 mg Oral Daily    have reviewed scheduled and prn medications.  Physical Exam:    General adult male in bed in no acute distress HEENT normocephalic atraumatic extraocular movements intact sclera anicteric Neck supple trachea midline Lungs clear to auscultation bilaterally normal work of breathing at rest  Heart S1S2 no rub Abdomen soft nontender nondistended Extremities no edema  Psych normal mood and affect Neuro - alert and oriented x 3 provides hx and follows commands  GU - foley in place with hematuria  Access RIJ tunneled catheter      Edward [name] 06/28/2024,11:35 AM  LOS: 12 days

## 2024-06-28 NOTE — Assessment & Plan Note (Addendum)
 HTN: Continue home amLODipine  10 mg daily, Carvedilol  3.125 mg BID DMII: sSSI, Glargine 8 units started 9/14. Will plan on keep his insulin  as is for now as he will completed his Prednisolone treatment today HLD: Continue Ezetimibe  10 mg daily Bipolar 1 disorder: Increase QUEtiapine  25 mg to 50 mg at bedtime since buspirone  is now stopped. Will repeat EKG tmrw morning Depression: Stopped Buspar  7.5 mg daily  Constipation: Continue MiraLAX  twice daily, senna daily

## 2024-06-28 NOTE — Assessment & Plan Note (Addendum)
 Well-controlled on PO pain regimen. - Continue prednisone  taper - Pain regimen: - Acetaminophen  650 mg every 8 hours scheduled - Oxycodone  5-10 mg every 4 hours as needed for moderate/severe pain - Lidocaine  patches - Tizanidine  4 mg every 8 hours as needed - Need outpatient f/u with Rheumatology  - PT/OT follow

## 2024-06-28 NOTE — Progress Notes (Signed)
 Occupational Therapy Treatment Patient Details Name: Edward [name] MRN: [id number] DOB: 08-10-1971 Today's Date: 06/28/2024   History of present illness Pt is a 53 y.o. male presenting 06/15/24 with R groin pain that radiates to lower back and causing LE weakness and inability to walk.  MRI showed increased size L4-5 disc extrusion and unchanged L5-S1 central disc protrusion.  PMH: DMII, HTN, HLD, ankylosing spondylitis in spine, bipolar 1, CKD V (pending HD), gastric bypass, L thalamic CVA 05/22/24, remote infarcts L basal ganglia and L thalamus   OT comments  Pt progressing toward goals, able to ambulate longer household distance with supervision using RW, able to stand at sink for grooming task during session. Pt with plan for HD today and new fistula placement tomorrow. Pt presenting with impairments listed below, will follow acutely. Continue to recommend HHOT at d/c.       If plan is discharge home, recommend the following:  A little help with walking and/or transfers;A little help with bathing/dressing/bathroom;Assistance with cooking/housework;Assist for transportation   Equipment Recommendations  Other (comment) (RW)    Recommendations for Other Services      Precautions / Restrictions Precautions Precautions: Fall Restrictions Weight Bearing Restrictions Per Provider Order: No       Mobility Bed Mobility Overal bed mobility: Independent                  Transfers Overall transfer level: Needs assistance Equipment used: Rolling walker (2 wheels) Transfers: Sit to/from Stand Sit to Stand: Supervision                 Balance Overall balance assessment: Needs assistance   Sitting balance-Leahy Scale: Normal     Standing balance support: Bilateral upper extremity supported, During functional activity, Reliant on assistive device for balance Standing balance-Leahy Scale: Fair Standing balance comment: static standing without AD                            ADL either performed or assessed with clinical judgement   ADL Overall ADL's : Needs assistance/impaired     Grooming: Supervision/safety;Sitting                   Toilet Transfer: Supervision/safety;Ambulation;Rolling walker (2 wheels)                  Extremity/Trunk Assessment Upper Extremity Assessment Upper Extremity Assessment: Right hand dominant;Generalized weakness   Lower Extremity Assessment Lower Extremity Assessment: Defer to PT evaluation        Vision   Vision Assessment?: No apparent visual deficits   Perception Perception Perception: Within Functional Limits   Praxis Praxis Praxis: WFL   Communication Communication Communication: No apparent difficulties   Cognition Arousal: Alert Behavior During Therapy: WFL for tasks assessed/performed Cognition: No apparent impairments                               Following commands: Intact Following commands impaired: Follows multi-step commands with increased time      Cueing   Cueing Techniques: Verbal cues  Exercises      Shoulder Instructions       General Comments VSS on RA    Pertinent Vitals/ Pain       Pain Assessment Pain Assessment: Faces Pain Score: 4  Faces Pain Scale: Hurts little more Pain Location: back Pain Descriptors / Indicators: Discomfort Pain Intervention(s): Limited activity within patient's  tolerance, Monitored during session, Repositioned  Home Living                                          Prior Functioning/Environment              Frequency  Min 2X/week        Progress Toward Goals  OT Goals(current goals can now be found in the care plan section)  Progress towards OT goals: Progressing toward goals  Acute Rehab OT Goals Patient Stated Goal: none stated OT Goal Formulation: With patient Time For Goal Achievement: 06/30/24 Potential to Achieve Goals: Good ADL Goals Pt Will Perform Grooming:  with contact guard assist;standing Pt Will Perform Lower Body Bathing: with contact guard assist;sit to/from stand Pt Will Perform Lower Body Dressing: with contact guard assist;sit to/from stand Pt Will Transfer to Toilet: with supervision;ambulating;regular height toilet;grab bars Pt Will Perform Toileting - Clothing Manipulation and hygiene: with supervision;sit to/from stand;sitting/lateral leans  Plan      Co-evaluation                 AM-PAC OT 6 Clicks Daily Activity     Outcome Measure   Help from another person eating meals?: None Help from another person taking care of personal grooming?: None Help from another person toileting, which includes using toliet, bedpan, or urinal?: A Little Help from another person bathing (including washing, rinsing, drying)?: A Little Help from another person to put on and taking off regular upper body clothing?: A Little Help from another person to put on and taking off regular lower body clothing?: A Little 6 Click Score: 20    End of Session Equipment Utilized During Treatment: Gait belt;Rolling walker (2 wheels)  OT Visit Diagnosis: Unsteadiness on feet (R26.81);Other abnormalities of gait and mobility (R26.89);Muscle weakness (generalized) (M62.81);Ataxia, unspecified (R27.0);Other symptoms and signs involving cognitive function   Activity Tolerance Patient tolerated treatment well   Patient Left with call bell/phone within reach;in chair;with family/visitor present   Nurse Communication Mobility status        Time: 8752-8694 OT Time Calculation (min): 18 min  Charges: OT General Charges $OT Visit: 1 Visit OT Treatments $Self Care/Home Management : 8-22 mins  Edward [name], OTD, OTR/L SecureChat Preferred Acute Rehab [phone]   Edward [name] 06/28/2024, 2:28 PM

## 2024-06-28 NOTE — Progress Notes (Signed)
 Subjective: No acute events overnight.  Patient reports to be tolerating the catheter well without incidence of clot obstruction.  He was accompanied by his mother.  Case and plan was reviewed with them both.  Objective: Vital signs in last 24 hours: Temp:  [97.6 F (36.4 C)-98.2 F (36.8 C)] 97.6 F (36.4 C) (09/16 0758) Pulse Rate:  [58-74] 60 (09/16 0758) Resp:  [10-18] 15 (09/16 0758) BP: (116-145)/(66-94) 134/66 (09/16 0758) SpO2:  [97 %-100 %] 97 % (09/16 0758) Weight:  [119.1 kg] 119.1 kg (09/15 1648)  Assessment/Plan: # Gross hematuria following renal biopsy # bladder hematoma  Patient failed trial of void yesterday.  Urine was clear at time of TOV.  Around 150 mL of bloody urine was returned with placement of coud catheter.  Will have nursing hand irrigate for the time being.  I hate to put a fourth catheter in this gentleman, as these have been more traumatic than normal. Reviewed with nephrology.  Will collect CT hematuria to assess for clot accumulation in bladder in addition to ongoing bleeding from upper tract.  N.p.o. at midnight in the event he needs to go to the OR for [facility] and fulguration. Trend H&H.  Intake/Output from previous day: 09/15 0701 - 09/16 0700 In: 3120 [P.O.:120] Out: 3250 [Urine:1750]  Intake/Output this shift: Total I/O In: 240 [P.O.:240] Out: 1050 [Urine:1050]  Physical Exam:  General: Alert and oriented CV: No cyanosis Lungs: equal chest rise Abdomen: Soft, NTND, no rebound or guarding Gu: Three-way 45f in place draining blood-tinged irrigant  Lab Results: Recent Labs    06/26/24 1615 06/27/24 0556 06/28/24 0351  HGB 8.1* 8.1* 7.8*  HCT 25.2* 25.4* 24.8*   BMET Recent Labs    06/27/24 0556 06/28/24 0351  NA 138 138  K 4.4 4.0  CL 101 100  CO2 25 27  GLUCOSE 177* 110*  BUN 80* 48*  CREATININE 5.06* 3.99*  CALCIUM  7.7* 8.0*  HGB 8.1* 7.8*  WBC 14.3* 14.8*     Studies/Results: VAS US  UPPER EXT VEIN MAPPING  (PRE-OP  AVF) Result Date: 06/27/2024 UPPER EXTREMITY VEIN MAPPING Patient Name:  Edward [name]  Date of Exam:   06/27/2024 Medical Rec #: [id number]      Accession #:    [id number] Date of Birth: [birth year]      Patient Gender: M Patient Age:   53 years Exam Location:  [facility] Procedure:      VAS US  UPPER EXT VEIN MAPPING (PRE-OP  AVF) Referring Phys: KATHERYN SABA --------------------------------------------------------------------------------  Indications: Pre-access. History: ESRD. CKDV, CVA, HTN, DM2, HLD.  Comparison Study: No prior study. Performing Technologist: Edilia Elden Appl  Examination Guidelines: A complete evaluation includes B-mode imaging, spectral Doppler, color Doppler, and power Doppler as needed of all accessible portions of each vessel. Bilateral testing is considered an integral part of a complete examination. Limited examinations for reoccurring indications may be performed as noted. +-----------------+-------------+----------+-----------------------+ Right Cephalic   Diameter (cm)Depth (cm)       Findings         +-----------------+-------------+----------+-----------------------+ Shoulder             0.50        1.70                           +-----------------+-------------+----------+-----------------------+ Prox upper arm       0.52        0.87                           +-----------------+-------------+----------+-----------------------+  Mid upper arm        0.53        0.29                           +-----------------+-------------+----------+-----------------------+ Dist upper arm       0.42        0.52   branching and Thrombus. +-----------------+-------------+----------+-----------------------+ Antecubital fossa    0.49        0.40                           +-----------------+-------------+----------+-----------------------+ Prox forearm         0.35        0.49                            +-----------------+-------------+----------+-----------------------+ Mid forearm          0.35        0.25                           +-----------------+-------------+----------+-----------------------+ Dist forearm         0.40        0.16                           +-----------------+-------------+----------+-----------------------+ Wrist                0.29        0.18                           +-----------------+-------------+----------+-----------------------+ +-----------------+-------------+----------+---------+ Right Basilic    Diameter (cm)Depth (cm)Findings  +-----------------+-------------+----------+---------+ Prox upper arm       0.47                         +-----------------+-------------+----------+---------+ Mid upper arm        0.50                         +-----------------+-------------+----------+---------+ Dist upper arm       0.45               branching +-----------------+-------------+----------+---------+ Antecubital fossa    0.25                         +-----------------+-------------+----------+---------+ Prox forearm         0.30                         +-----------------+-------------+----------+---------+ Mid forearm          0.29                         +-----------------+-------------+----------+---------+ Distal forearm       0.23               branching +-----------------+-------------+----------+---------+ Elbow                0.33                         +-----------------+-------------+----------+---------+ Wrist                0.25                         +-----------------+-------------+----------+---------+ +-----------------+-------------+----------+---------+  Left Cephalic    Diameter (cm)Depth (cm)Findings  +-----------------+-------------+----------+---------+ Shoulder             0.50        1.36             +-----------------+-------------+----------+---------+ Prox upper arm       0.52         0.48             +-----------------+-------------+----------+---------+ Mid upper arm        0.54        0.27   branching +-----------------+-------------+----------+---------+ Dist upper arm       0.52        0.51   branching +-----------------+-------------+----------+---------+ Antecubital fossa    0.32        0.25             +-----------------+-------------+----------+---------+ Prox forearm         0.37        0.54   branching +-----------------+-------------+----------+---------+ Mid forearm          0.37        0.31             +-----------------+-------------+----------+---------+ Dist forearm         0.27        0.16             +-----------------+-------------+----------+---------+ Wrist                0.27        0.22             +-----------------+-------------+----------+---------+ +-----------------+-------------+----------+---------+ Left Basilic     Diameter (cm)Depth (cm)Findings  +-----------------+-------------+----------+---------+ Prox upper arm       0.55                         +-----------------+-------------+----------+---------+ Mid upper arm        0.57                         +-----------------+-------------+----------+---------+ Dist upper arm       0.40               branching +-----------------+-------------+----------+---------+ Antecubital fossa    0.34                         +-----------------+-------------+----------+---------+ Prox forearm         0.21                         +-----------------+-------------+----------+---------+ Mid forearm          0.17                         +-----------------+-------------+----------+---------+ Distal forearm       0.15                         +-----------------+-------------+----------+---------+ Elbow                0.31                         +-----------------+-------------+----------+---------+ Wrist                0.26                          +-----------------+-------------+----------+---------+ *  See table(s) above for measurements and observations.  Diagnosing physician: Lonni Gaskins MD Electronically signed by Lonni Gaskins MD on 06/27/2024 at 1:34:10 PM.    Final        LOS: 12 days   Ole Bourdon, NP [facility] Pager: [phone]  06/28/2024, 1:51 PM

## 2024-06-28 NOTE — Progress Notes (Signed)
 [facility]  Subjective  -no complaints   Objective 116/74 60 97.6 F (36.4 C) (Oral) 16 100%  Intake/Output Summary (Last 24 hours) at 06/28/2024 0645 Last data filed at 06/27/2024 1648 Gross per 24 hour  Intake 3120 ml  Output 3250 ml  Net -130 ml    Bilateral radial pulses palpable  Laboratory Lab Results: Recent Labs    06/27/24 0556 06/28/24 0351  WBC 14.3* 14.8*  HGB 8.1* 7.8*  HCT 25.4* 24.8*  PLT 295 295   BMET Recent Labs    06/27/24 0556 06/28/24 0351  NA 138 138  K 4.4 4.0  CL 101 100  CO2 25 27  GLUCOSE 177* 110*  BUN 80* 48*  CREATININE 5.06* 3.99*  CALCIUM  7.7* 8.0*    COAG Lab Results  Component Value Date   INR 1.1 06/22/2024   INR 1.2 06/15/2024   No results found for: PTT  Assessment/Planning:  53 year old male with a vascular surgery was consulted for new access.  Plan left arm AV fistula versus graft tomorrow in the OR.  N.p.o. after midnight.  Consent order placed.  Questions answered.  Does appear to have usable cephalic and basilic vein on vein mapping.  Edward [name] 06/28/2024 6:45 AM --

## 2024-06-28 NOTE — Progress Notes (Signed)
  Received patient in bed to unit.   Informed consent signed and in chart.    TX duration:3     Transported by  Hand-off given to patient's nurse.    Access used: rt CVC Access issues: None   Total UF removed: 1.0 Medication(s) given: none Post HD VS: 141/74 Post HD weight: 118.8     Hunter Hacking  LPN [facility]

## 2024-06-28 NOTE — Assessment & Plan Note (Addendum)
 On PRN iHD per Nephrology due to no sign of renal recovery.  TDC placed by IR 9/12.  - Nephrology consultation, appreciate recommendations Consult vascular surgery re: AVF - Appreciate vascular surgery NPO after MN for Fistula VS Graft w/ vascular tmrw HD today - get on schedule (and also note plans for surgery tomorrow).  Then will be TTS He has been accepted for outpatient HD TTS at [facility]   In the longer-term, he is interested in home therapy and kidney transplant evaluation. - Renal diet with 1200 ml fluid restriction  - Strict I's and O's - Electrolyte management as indicated

## 2024-06-29 ENCOUNTER — Inpatient Hospital Stay (HOSPITAL_COMMUNITY): Payer: PRIVATE HEALTH INSURANCE

## 2024-06-29 ENCOUNTER — Encounter (HOSPITAL_COMMUNITY)
Admission: EM | Disposition: A | Discharge status: Home Health | Payer: Self-pay | Source: Home / Self Care | Attending: Family Medicine

## 2024-06-29 ENCOUNTER — Other Ambulatory Visit: Payer: Self-pay

## 2024-06-29 ENCOUNTER — Encounter (HOSPITAL_COMMUNITY): Payer: Self-pay | Admitting: Family Medicine

## 2024-06-29 DIAGNOSIS — Z992 Dependence on renal dialysis: Secondary | ICD-10-CM

## 2024-06-29 DIAGNOSIS — I120 Hypertensive chronic kidney disease with stage 5 chronic kidney disease or end stage renal disease: Secondary | ICD-10-CM | POA: Diagnosis not present

## 2024-06-29 DIAGNOSIS — I70298 Other atherosclerosis of native arteries of extremities, other extremity: Secondary | ICD-10-CM

## 2024-06-29 DIAGNOSIS — N186 End stage renal disease: Secondary | ICD-10-CM

## 2024-06-29 DIAGNOSIS — R339 Retention of urine, unspecified: Secondary | ICD-10-CM | POA: Diagnosis not present

## 2024-06-29 DIAGNOSIS — E1122 Type 2 diabetes mellitus with diabetic chronic kidney disease: Secondary | ICD-10-CM | POA: Diagnosis not present

## 2024-06-29 DIAGNOSIS — S37019A Minor contusion of unspecified kidney, initial encounter: Secondary | ICD-10-CM

## 2024-06-29 HISTORY — PX: AV FISTULA PLACEMENT: SHX1204

## 2024-06-29 LAB — RENAL FUNCTION PANEL
Albumin: 2 g/dL — ABNORMAL LOW (ref 3.5–5.0)
Anion gap: 16 — ABNORMAL HIGH (ref 5–15)
BUN: 30 mg/dL — ABNORMAL HIGH (ref 6–20)
CO2: 26 mmol/L (ref 22–32)
Calcium: 7.8 mg/dL — ABNORMAL LOW (ref 8.9–10.3)
Chloride: 93 mmol/L — ABNORMAL LOW (ref 98–111)
Creatinine, Ser: 3.23 mg/dL — ABNORMAL HIGH (ref 0.61–1.24)
GFR, Estimated: 22 mL/min — ABNORMAL LOW (ref >60)
Glucose, Bld: 147 mg/dL — ABNORMAL HIGH (ref 70–99)
Phosphorus: 3.4 mg/dL (ref 2.5–4.6)
Potassium: 3.7 mmol/L (ref 3.5–5.1)
Sodium: 135 mmol/L (ref 135–145)

## 2024-06-29 LAB — CBC
HCT: 25.2 % — ABNORMAL LOW (ref 39.0–52.0)
Hemoglobin: 8 g/dL — ABNORMAL LOW (ref 13.0–17.0)
MCH: 28.6 pg (ref 26.0–34.0)
MCHC: 31.7 g/dL (ref 30.0–36.0)
MCV: 90 fL (ref 80.0–100.0)
Platelets: 287 K/uL (ref 150–400)
RBC: 2.8 MIL/uL — ABNORMAL LOW (ref 4.22–5.81)
RDW: 15.5 % (ref 11.5–15.5)
WBC: 14.1 K/uL — ABNORMAL HIGH (ref 4.0–10.5)
nRBC: 0 % (ref 0.0–0.2)

## 2024-06-29 LAB — HEMOGLOBIN AND HEMATOCRIT, BLOOD
HCT: 30.2 % — ABNORMAL LOW (ref 39.0–52.0)
Hemoglobin: 9.5 g/dL — ABNORMAL LOW (ref 13.0–17.0)

## 2024-06-29 LAB — GLUCOSE, CAPILLARY
Glucose-Capillary: 116 mg/dL — ABNORMAL HIGH (ref 70–99)
Glucose-Capillary: 112 mg/dL — ABNORMAL HIGH (ref 70–99)
Glucose-Capillary: 136 mg/dL — ABNORMAL HIGH (ref 70–99)

## 2024-06-29 SURGERY — ARTERIOVENOUS (AV) FISTULA CREATION
Anesthesia: General | Site: Arm Upper | Laterality: Left

## 2024-06-29 MED ORDER — FENTANYL CITRATE (PF) 100 MCG/2ML IJ SOLN
INTRAMUSCULAR | Status: AC
  Filled 2024-06-29: qty 2

## 2024-06-29 MED ORDER — LIDOCAINE HCL URETHRAL/MUCOSAL 2 % EX GEL
1.0000 | Freq: Once | CUTANEOUS | Status: DC
  Filled 2024-06-29: qty 6

## 2024-06-29 MED ORDER — MIDAZOLAM HCL 2 MG/2ML IJ SOLN
INTRAMUSCULAR | Status: DC | PRN
Start: 2024-06-29 — End: 2024-06-29
  Administered 2024-06-29: 2 mg via INTRAVENOUS

## 2024-06-29 MED ORDER — HEPARIN 6000 UNIT IRRIGATION SOLUTION
Status: DC | PRN
  Administered 2024-06-29: 1

## 2024-06-29 MED ORDER — DEXAMETHASONE SODIUM PHOSPHATE 10 MG/ML IJ SOLN
INTRAMUSCULAR | Status: DC | PRN
  Administered 2024-06-29: 10 mg via INTRAVENOUS

## 2024-06-29 MED ORDER — LIDOCAINE HCL 1 % IJ SOLN
INTRAMUSCULAR | Status: DC | PRN
  Administered 2024-06-29: 4 mL

## 2024-06-29 MED ORDER — HYDROMORPHONE HCL 1 MG/ML IJ SOLN
INTRAMUSCULAR | Status: AC
  Filled 2024-06-29: qty 0.5

## 2024-06-29 MED ORDER — ACETAMINOPHEN 10 MG/ML IV SOLN: 1000.0000 mg | Freq: Once | INTRAVENOUS | Status: DC | PRN

## 2024-06-29 MED ORDER — CHLORHEXIDINE GLUCONATE CLOTH 2 % EX PADS
6.0000 | MEDICATED_PAD | Freq: Every day | CUTANEOUS | Status: DC
  Administered 2024-06-30 – 2024-07-01 (×2): 6 via TOPICAL

## 2024-06-29 MED ORDER — PROPOFOL 10 MG/ML IV BOLUS
INTRAVENOUS | Status: AC
  Filled 2024-06-29: qty 20

## 2024-06-29 MED ORDER — ONDANSETRON HCL 4 MG/2ML IJ SOLN
INTRAMUSCULAR | Status: AC
  Filled 2024-06-29: qty 2

## 2024-06-29 MED ORDER — SODIUM CHLORIDE 0.9 % IV SOLN: INTRAVENOUS | Status: DC

## 2024-06-29 MED ORDER — ORAL CARE MOUTH RINSE: 15.0000 mL | Freq: Once | OROMUCOSAL | Status: AC

## 2024-06-29 MED ORDER — HEPARIN 6000 UNIT IRRIGATION SOLUTION
Status: AC
  Filled 2024-06-29: qty 500

## 2024-06-29 MED ORDER — LIDOCAINE HCL (PF) 1 % IJ SOLN
INTRAMUSCULAR | Status: AC
  Filled 2024-06-29: qty 30

## 2024-06-29 MED ORDER — EPHEDRINE SULFATE-NACL 50-0.9 MG/10ML-% IV SOSY
PREFILLED_SYRINGE | INTRAVENOUS | Status: DC | PRN
  Administered 2024-06-29: 5 mg via INTRAVENOUS

## 2024-06-29 MED ORDER — MIDAZOLAM HCL 2 MG/2ML IJ SOLN
INTRAMUSCULAR | Status: AC
  Filled 2024-06-29: qty 2

## 2024-06-29 MED ORDER — CEFAZOLIN SODIUM-DEXTROSE 2-3 GM-%(50ML) IV SOLR
INTRAVENOUS | Status: DC | PRN
Start: 2024-06-29 — End: 2024-06-29
  Administered 2024-06-29: 2 g via INTRAVENOUS

## 2024-06-29 MED ORDER — LIDOCAINE 2% (20 MG/ML) 5 ML SYRINGE
INTRAMUSCULAR | Status: DC | PRN
  Administered 2024-06-29: 80 mg via INTRAVENOUS

## 2024-06-29 MED ORDER — CHLORHEXIDINE GLUCONATE 0.12 % MT SOLN
OROMUCOSAL | Status: AC
  Administered 2024-06-29: 15 mL via OROMUCOSAL
  Filled 2024-06-29: qty 15

## 2024-06-29 MED ORDER — FENTANYL CITRATE (PF) 250 MCG/5ML IJ SOLN
INTRAMUSCULAR | Status: DC | PRN
  Administered 2024-06-29: 100 ug via INTRAVENOUS
  Administered 2024-06-29: 50 ug via INTRAVENOUS

## 2024-06-29 MED ORDER — OXYCODONE HCL 5 MG PO TABS: 5.0000 mg | ORAL_TABLET | Freq: Once | ORAL | Status: DC | PRN

## 2024-06-29 MED ORDER — INSULIN GLARGINE 100 UNIT/ML ~~LOC~~ SOLN
5.0000 [IU] | Freq: Every day | SUBCUTANEOUS | Status: DC
Start: 2024-06-30 — End: 2024-07-02
  Administered 2024-06-30 – 2024-07-02 (×3): 5 [IU] via SUBCUTANEOUS
  Filled 2024-06-29 (×3): qty 0.05

## 2024-06-29 MED ORDER — DEXAMETHASONE SODIUM PHOSPHATE 10 MG/ML IJ SOLN
INTRAMUSCULAR | Status: AC
  Filled 2024-06-29: qty 1

## 2024-06-29 MED ORDER — PROPOFOL 10 MG/ML IV BOLUS
INTRAVENOUS | Status: DC | PRN
  Administered 2024-06-29: 180 mg via INTRAVENOUS

## 2024-06-29 MED ORDER — INSULIN ASPART 100 UNIT/ML IJ SOLN: 0.0000 [IU] | INTRAMUSCULAR | Status: DC | PRN

## 2024-06-29 MED ORDER — FENTANYL CITRATE (PF) 250 MCG/5ML IJ SOLN
INTRAMUSCULAR | Status: AC
  Filled 2024-06-29: qty 5

## 2024-06-29 MED ORDER — HYDROMORPHONE HCL 1 MG/ML IJ SOLN
INTRAMUSCULAR | Status: DC | PRN
  Administered 2024-06-29: .5 mg via INTRAVENOUS

## 2024-06-29 MED ORDER — FENTANYL CITRATE (PF) 100 MCG/2ML IJ SOLN
25.0000 ug | INTRAMUSCULAR | Status: DC | PRN
  Administered 2024-06-29 (×2): 50 ug via INTRAVENOUS

## 2024-06-29 MED ORDER — OXYCODONE HCL 5 MG/5ML PO SOLN: 5.0000 mg | Freq: Once | ORAL | Status: DC | PRN

## 2024-06-29 MED ORDER — LIDOCAINE HCL (CARDIAC) PF 100 MG/5ML IV SOSY
PREFILLED_SYRINGE | INTRAVENOUS | Status: AC
  Filled 2024-06-29: qty 5

## 2024-06-29 MED ORDER — SENNA 8.6 MG PO TABS
2.0000 | ORAL_TABLET | Freq: Two times a day (BID) | ORAL | Status: DC
Start: 2024-06-29 — End: 2024-07-02
  Administered 2024-06-29 – 2024-07-02 (×6): 17.2 mg via ORAL
  Filled 2024-06-29 (×6): qty 2

## 2024-06-29 MED ORDER — ONDANSETRON HCL 4 MG/2ML IJ SOLN
INTRAMUSCULAR | Status: DC | PRN
  Administered 2024-06-29: 4 mg via INTRAVENOUS

## 2024-06-29 MED ORDER — PHENYLEPHRINE HCL-NACL 20-0.9 MG/250ML-% IV SOLN
INTRAVENOUS | Status: DC | PRN
  Administered 2024-06-29: 20 ug/min via INTRAVENOUS

## 2024-06-29 MED ORDER — LIDOCAINE 2% (20 MG/ML) 5 ML SYRINGE
INTRAMUSCULAR | Status: AC
  Filled 2024-06-29: qty 5

## 2024-06-29 MED ORDER — CHLORHEXIDINE GLUCONATE 0.12 % MT SOLN: 15.0000 mL | Freq: Once | OROMUCOSAL | Status: AC

## 2024-06-29 MED ORDER — 0.9 % SODIUM CHLORIDE (POUR BTL) OPTIME
TOPICAL | Status: DC | PRN
  Administered 2024-06-29: 1000 mL

## 2024-06-29 MED ORDER — ROCURONIUM BROMIDE 10 MG/ML (PF) SYRINGE
PREFILLED_SYRINGE | INTRAVENOUS | Status: AC
  Filled 2024-06-29: qty 10

## 2024-06-29 SURGICAL SUPPLY — 33 items
ARMBAND PINK RESTRICT EXTREMIT (MISCELLANEOUS) ×3 IMPLANT
BAG COUNTER SPONGE SURGICOUNT (BAG) ×2 IMPLANT
BLADE CLIPPER SURG (BLADE) ×2 IMPLANT
CANISTER SUCTION 3000ML PPV (SUCTIONS) ×2 IMPLANT
CANISTER WOUND CARE 500ML ATS (WOUND CARE) ×1 IMPLANT
CLIP TI MEDIUM 6 (CLIP) ×2 IMPLANT
CLIP TI WIDE RED SMALL 6 (CLIP) ×2 IMPLANT
COVER PROBE W GEL 5X96 (DRAPES) ×2 IMPLANT
DERMABOND ADVANCED .7 DNX12 (GAUZE/BANDAGES/DRESSINGS) ×3 IMPLANT
DRAPE DERMATAC (DRAPES) ×1 IMPLANT
ELECTRODE REM PT RTRN 9FT ADLT (ELECTROSURGICAL) ×2 IMPLANT
GLOVE BIO SURGEON STRL SZ7 (GLOVE) ×1 IMPLANT
GLOVE BIO SURGEON STRL SZ7.5 (GLOVE) ×2 IMPLANT
GLOVE BIOGEL PI IND STRL 7.5 (GLOVE) ×1 IMPLANT
GLOVE BIOGEL PI IND STRL 8 (GLOVE) ×2 IMPLANT
GOWN STRL REUS W/ TWL LRG LVL3 (GOWN DISPOSABLE) ×4 IMPLANT
GOWN STRL REUS W/ TWL XL LVL3 (GOWN DISPOSABLE) ×4 IMPLANT
KIT BASIN OR (CUSTOM PROCEDURE TRAY) ×2 IMPLANT
KIT TURNOVER KIT B (KITS) ×2 IMPLANT
LOOP VESSEL MINI RED (MISCELLANEOUS) ×1 IMPLANT
NS IRRIG 1000ML POUR BTL (IV SOLUTION) ×2 IMPLANT
PACK CV ACCESS (CUSTOM PROCEDURE TRAY) ×2 IMPLANT
PAD ARMBOARD POSITIONER FOAM (MISCELLANEOUS) ×4 IMPLANT
SET CYSTO W/LG BORE CLAMP LF (SET/KITS/TRAYS/PACK) ×1 IMPLANT
SLING ARM FOAM STRAP LRG (SOFTGOODS) IMPLANT
SPIKE FLUID TRANSFER (MISCELLANEOUS) ×1 IMPLANT
SUT MNCRL AB 4-0 PS2 18 (SUTURE) ×2 IMPLANT
SUT PROLENE 6 0 BV (SUTURE) ×2 IMPLANT
SUT PROLENE 7 0 BV 1 (SUTURE) IMPLANT
SUT VIC AB 3-0 SH 27X BRD (SUTURE) ×2 IMPLANT
TOWEL GREEN STERILE (TOWEL DISPOSABLE) ×2 IMPLANT
UNDERPAD 30X36 HEAVY ABSORB (UNDERPADS AND DIAPERS) ×2 IMPLANT
WATER STERILE IRR 1000ML POUR (IV SOLUTION) ×2 IMPLANT

## 2024-06-29 NOTE — Assessment & Plan Note (Addendum)
 On PRN iHD per Nephrology due to no sign of renal recovery.  TDC placed by IR 9/12.  - Nephrology consultation, appreciate recommendations Consult vascular surgery re: AVF - Appreciate vascular surgery NPO after MN for Fistula VS Graft w/ vascular today iHD on TThS He has been accepted for outpatient HD TTS at [facility]   In the longer-term, he is interested in home therapy and kidney transplant evaluation. - NPO - pre-procedure - Strict I's and O's - Electrolyte management as indicated

## 2024-06-29 NOTE — Consult Note (Signed)
 Chief Complaint: Patient was seen in consultation today for perinephric hematoma with possible active bleeding, and with consideration for left renal angiogram with possible embolization.  Referring Provider(s): Dr. Valli Shank, MD.   Supervising Physician: Jenna Hacker  Patient Status: [facility] - In-pt  Patient is Full Code  History of Present Illness: Edward [name] is a 53 y.o. male with PMH significant for CKD, DM2, HTN, ankylosing spondylosis (on steroid), perf gastric ulcer May 2025.    Patient is known to IR service.  He underwent left renal biopsy on 9/8, and was subsequently noted with perirenal hematoma with gross hematuria.  Patient also received tunneled dialysis catheter by IR on 912, and subsequently initiated hemodialysis.  He was admitted with weakness, which was complicated by urinary retention requiring catheterization, and subsequent hematuria.  He has had worsening AKI now ESRD.  He was placed on CBI x 1 week, with persistent hematuria.  CT hematuria on 916 concerning for active bleed.  Patient is currently hemodynamically stable.  However, hemoglobin declined to a nadir of 7.8 on 916.  Currently 9.5.  Interventional Radiology was requested for left renal angiogram with possible embolization. Request was reviewed and approved by Dr. Hughes. Patient is scheduled for same in IR today.   Patient is alert and laying in bed, calm.  Patient is currently without any significant complaints.  Patient denies any fevers, headache, chest pain, SOB, cough, abdominal pain, nausea, vomiting or bleeding.     Past Medical History:  Diagnosis Date   Ankylosing spondylitis (HCC) 1993   Bipolar depression (HCC) 2005   CVA (cerebral vascular accident) (HCC) 02/2024   DM (diabetes mellitus), type 2 (HCC) 2007   Generalized anxiety disorder 1997   High-functioning autism spectrum disorder 2005   Renal disorder    CKD stage 4   Rheumatic fever age 38    Past Surgical  History:  Procedure Laterality Date   arthroscopic surgery Left years ago   both knee cartlidge surgery     GASTRIC ROUX-EN-Y N/A 08/06/2015   Procedure: LAPAROSCOPIC ROUX-EN-Y GASTRIC BYPASS WITH UPPER ENDOSCOPY;  Surgeon: Camellia Blush, MD;  Location: [facility];  Service: General;  Laterality: N/A;   HIP SURGERY Left 1986   with knee surgery   IR TUNNELED CENTRAL VENOUS CATH [facility]  06/24/2024   KNEE SURGERY Right 1986   benign tumor with bone graft    UPPER GI ENDOSCOPY  08/06/2015   Procedure: UPPER GI ENDOSCOPY;  Surgeon: Camellia Blush, MD;  Location: [facility];  Service: General;;    Allergies: Demerol [meperidine], Nsaids, Tapentadol, and Tramadol   Medications: Prior to Admission medications   Medication Sig Start Date End Date Taking? Authorizing Provider  acetaminophen  (ACETAMINOPHEN  8 HOUR) 650 MG CR tablet Take 1,300 mg by mouth 2 (two) times daily.   Yes [provider]  amLODipine  (NORVASC ) 10 MG tablet Take 1 tablet (10 mg total) by mouth daily. 05/09/24  Yes Myrna Bitters, DO  aspirin  EC 81 MG tablet Take 1 tablet (81 mg total) by mouth daily. Swallow whole. 05/25/24  Yes Everhart, Kirstie, DO  atorvastatin  (LIPITOR) 40 MG tablet Take 1 tablet (40 mg total) by mouth daily. 05/25/24  Yes Everhart, Kirstie, DO  carvedilol  (COREG ) 3.125 MG tablet Take 3.125 mg by mouth 2 (two) times daily. 05/18/24  Yes [provider]  diclofenac Sodium (VOLTAREN) 1 % GEL Apply 4 g topically in the morning and at bedtime. Apply topically to neck and lumbar spine twice per  day.   Yes [provider]  ezetimibe  (ZETIA ) 10 MG tablet Take 1 tablet (10 mg total) by mouth daily. 05/25/24  Yes Everhart, Kirstie, DO  ferrous sulfate  325 (65 FE) MG tablet Take 1 tablet (325 mg total) by mouth daily with breakfast. 03/21/24  Yes Pray, Rollene BRAVO, MD  pantoprazole  (PROTONIX ) 40 MG tablet Take 1 tablet (40 mg total) by mouth daily. 05/25/24  Yes Everhart, Kirstie, DO  QUEtiapine  (SEROQUEL ) 25  MG tablet Take 1 tablet 25mg  at night for three days, if not oversedated can increase to 2 tablets 50mg  total at night. Patient taking differently: 50 mg. 06/03/24  Yes Pray, Rollene BRAVO, MD  feeding supplement, GLUCERNA SHAKE, (GLUCERNA SHAKE) LIQD Take 237 mLs by mouth 3 (three) times daily between meals. 06/22/24   Gomes, Adriana, DO  lidocaine  (LIDODERM ) 5 % Place 2 patches onto the skin daily. Remove & Discard patch within 12 hours or as directed by MD 06/23/24   Janna Ferrier, DO  oxyCODONE  (OXY IR/ROXICODONE ) 5 MG immediate release tablet Take 1 tablet (5 mg total) by mouth every 6 (six) hours as needed for up to 14 days for severe pain (pain score 7-10) or moderate pain (pain score 4-6). 06/22/24 07/06/24  Gomes, Adriana, DO  polyethylene glycol powder (GLYCOLAX /MIRALAX ) 17 GM/SCOOP powder Dissolve 1 capful (17g) in 4-8 ounces of liquid and take by mouth twice daily 06/22/24   Gomes, Adriana, DO  predniSONE  (DELTASONE ) 10 MG tablet Take 3 tablets (30 mg total) by mouth daily with breakfast for 2 days, THEN 2 tablets (20 mg total) daily with breakfast for 2 days, THEN 1 tablet (10 mg total) daily with breakfast for 2 days. 06/23/24 06/29/24  Gomes, Adriana, DO  senna (SENOKOT) 8.6 MG TABS tablet Take 2 tablets (17.2 mg total) by mouth daily. 06/23/24   Gomes, Adriana, DO  sodium bicarbonate  650 MG tablet Take 2 tablets (1,300 mg total) by mouth 2 (two) times daily. 06/22/24   Gomes, Adriana, DO  tiZANidine  (ZANAFLEX ) 4 MG tablet Take 1 tablet (4 mg total) by mouth every 8 (eight) hours as needed for muscle spasms. 06/22/24   Janna Ferrier, DO     Family History  Problem Relation Age of Onset   Hyperlipidemia Mother    Hypertension Mother    Breast cancer Mother    Thyroid  disease Father    Early death Father 52       Cancer   Diabetes Father    Autoimmune disease Father        Ankylosing Spondylitis   Squamous cell carcinoma Father    Diabetes Brother    Hyperlipidemia Brother    Hypertension  Brother    Thyroid  disease Brother    Alcohol abuse Brother    Heart disease Maternal Grandmother    Stroke Maternal Grandmother    Heart disease Maternal Grandfather    Autoimmune disease Paternal Grandfather     Social History   Socioeconomic History   Marital status: Married    Spouse name: Not on file   Number of children: Not on file   Years of education: Not on file   Highest education level: Doctorate  Occupational History   Not on file  Tobacco Use   Smoking status: Former    Current packs/day: 0.00    Average packs/day: 0.3 packs/day for 10.0 years (2.5 ttl pk-yrs)    Types: Cigarettes    Start date: 04/16/2002    Quit date: 04/16/2012    Years since quitting:  12.2   Smokeless tobacco: Never  Vaping Use   Vaping status: Never Used  Substance and Sexual Activity   Alcohol use: No    Alcohol/week: 0.0 standard drinks of alcohol   Drug use: No   Sexual activity: Yes    Partners: Female  Other Topics Concern   Not on file  Social History Narrative   02/22/16   Married: Damien Like   Children: Rosa (anxiety/depression), [name] (Autism, non-verbal), Wilbern Credit   Education: PhD in [employer]   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   Social Drivers of Health   Financial Resource Strain: Patient Declined (03/20/2024)   Overall Financial Resource Strain (CARDIA)    Difficulty of Paying Living Expenses: Patient declined  Food Insecurity: No Food Insecurity (06/16/2024)   Hunger Vital Sign    Worried About Running Out of Food in the Last Year: Never true    Ran Out of Food in the Last Year: Never true  Transportation Needs: No Transportation Needs (06/16/2024)   PRAPARE - Administrator, Civil Service (Medical): No    Lack of Transportation (Non-Medical): No  Physical Activity: Insufficiently Active (03/20/2024)   Exercise Vital Sign    Days of Exercise per Week: 2 days    Minutes of Exercise per Session: 20 min  Stress: Stress Concern  Present (03/20/2024)   [employer] of Occupational Health - Occupational Stress Questionnaire    Feeling of Stress : Rather much  Social Connections: Socially Isolated (06/16/2024)   Social Connection and Isolation Panel    Frequency of Communication with Friends and Family: Three times a week    Frequency of Social Gatherings with Friends and Family: Twice a week    Attends Religious Services: Never    Database administrator or Organizations: No    Attends Engineer, structural: Never    Marital Status: Divorced     Review of Systems: A 12 point ROS discussed and pertinent positives are indicated in the HPI above.  All other systems are negative.  Vital Signs: BP 125/64 (BP Location: Right Arm)   Pulse 89   Temp 99.1 F (37.3 C) (Oral)   Resp 18   Ht 6' (1.829 m)   Wt 256 lb 9.9 oz (116.4 kg)   SpO2 99%   BMI 34.80 kg/m   Advance Care Plan: The advanced care place/surrogate decision maker was discussed at the time of visit and the patient did not wish to discuss or was not able to name a surrogate decision maker or provide an advance care plan.  Physical Exam Vitals reviewed.  Constitutional:      General: He is not in acute distress.    Appearance: Normal appearance.  HENT:     Mouth/Throat:     Mouth: Mucous membranes are dry.  Cardiovascular:     Rate and Rhythm: Normal rate and regular rhythm.  Pulmonary:     Effort: Pulmonary effort is normal.  Genitourinary:    Comments: Foley catheter in place draining grade 2-3 hematuria. Musculoskeletal:        General: Normal range of motion.     Cervical back: Normal range of motion.     Comments: No CVA tenderness to palpation bilaterally.  Skin:    General: Skin is warm and dry.  Neurological:     Mental Status: He is alert and oriented to person, place, and time.  Psychiatric:        Mood and Affect: Mood normal.  Behavior: Behavior normal.        Thought Content: Thought content normal.         Judgment: Judgment normal.     Imaging: CT HEMATURIA WORKUP Addendum Date: 06/28/2024 ADDENDUM REPORT: 06/28/2024 23:50 ADDENDUM: Critical Value/emergent results were called by telephone at the time of interpretation on 06/28/2024 at 11:36 pm to provider [facility] [place] , who verbally acknowledged these results. Electronically Signed   By: Francis Quam M.D.   On: 06/28/2024 23:50   Result Date: 06/28/2024 CLINICAL DATA:  The patient had a left renal biopsy on August 10 for history of chronic kidney disease stage 4, and has been having hematuria and weakness since then. EXAM: CT ABDOMEN AND PELVIS WITHOUT AND WITH CONTRAST TECHNIQUE: Multidetector CT imaging of the abdomen and pelvis was performed following the standard protocol before and following the bolus administration of intravenous contrast. RADIATION DOSE REDUCTION: This exam was performed according to the departmental dose-optimization program which includes automated exposure control, adjustment of the mA and/or kV according to patient size and/or use of iterative reconstruction technique. CONTRAST:  OMNIPAQUE  IOHEXOL  350 MG/ML SOLN COMPARISON:  The only comparison CT is CT without contrast before the biopsy on 05/22/2024. The biopsy was done under ultrasound. FINDINGS: Lower chest: With noncontrast imaging, the cardiac blood pool has become less dense than the myocardium consistent with anemia. The cardiac size is normal. There are scattered calcifications in the lower mitral ring. No pericardial effusion. Lung bases are clear of infiltrates. There is eventration and mild elevation anterior right diaphragm. Increased linear atelectasis right lower lobe posterior base. Hepatobiliary: Solitary 2 cm rim calcified stone in the proximal gallbladder. No wall thickening or bile duct dilatation. The liver is 22 cm in length and mildly steatotic. There is no mass enhancement. Pancreas: Partially atrophic. No mass common ductal dilatation or  inflammatory change. Spleen: No mass.  No splenomegaly. Adrenals/Urinary Tract: There is no adrenal or renal mass enhancement. No urinary stone or obstruction. Diminished and delayed excretion is noted bilaterally and symmetrically. There is an irregular posteroinferior left perinephric hematoma, maximum diameters of 7.5 cm transverse, 4.6 cm AP, and 3.0 cm craniocaudal, with minimal nonlocalizing hemorrhagic content layering posteriorly along the pararenal fascia. There is a linear track of contrast extending from the left lower pole parenchyma posteriorly into the hematoma on series 7 axial images 45-47 consistent with an active bleed. In the bladder, there is trace air anteriorly which may be from catheterization. Foley catheter is in place. New heterogeneous material collects posteriorly in the bladder which is probably clotted blood and was not seen previously. There is mild thickening of the bladder versus underdistention. Correlate with urinalysis for possible cystitis. Some perivesical stranding does appear present along the bladder dome. Stomach/Bowel: No dilatation or wall thickening including the appendix. No mesenteric inflammatory change. Remote Roux-en-Y gastric bypass. Surgically widened small bowel segment again noted anterior mid abdomen. Moderate-to-large amount of retained stool again noted ascending, transverse and descending colon, increased. There is sigmoid diverticulosis without diverticulitis. Vascular/Lymphatic: Versus in the abdominal wall are noted emptying in the common femoral veins. There is a prominent hepatic portal vein measuring 16 mm, stable. No portal vein thrombosis. Aortic atherosclerosis. No aneurysm or dissection. Shotty subcentimeter retroperitoneal lymph nodes are unchanged. No enlarged abdominopelvic nodes. Reproductive: Enlarged prostate 5.4 cm transverse axis. Other: There are small inguinal fat hernias. No incarcerated hernia. No hemorrhage is seen apart from the left  perinephric space bleed. No free air. Minimal presacral ascites increased since  August 10. Musculoskeletal: Mild degenerative change lumbar spine. Pelvic enthesopathy. Partial ankylosis of both SI joints. L4-5 right paracentral disc protrusion again noted with mass effect on the right L5 nerve root. IMPRESSION: 1. 7.5 x 4.6 x 3.0 cm left posteroinferior perinephric hematoma with a linear track of contrast extending from the left lower pole parenchyma posteriorly into the hematoma consistent with an active bleed. 2. Diminished and delayed renal excretion bilaterally and symmetrically, consistent with history of stage IV CKD. 3. New heterogeneous material in the bladder probably clotted blood. 4. Cystitis versus bladder nondistention. 5. Constipation and diverticulosis. 6. Cholelithiasis. 7. Aortic atherosclerosis. 8. Anemia, new since August 10. 9. L4-5 right paracentral disc protrusion with mass effect on the right L5 nerve root. 10. PRA is attempting to reach the ordering physician for stat notification at the time of signing. Aortic Atherosclerosis (ICD10-I70.0). Electronically Signed: By: Francis Quam M.D. On: 06/28/2024 23:32   VAS US  UPPER EXT VEIN MAPPING (PRE-OP  AVF) Result Date: 06/27/2024 UPPER EXTREMITY VEIN MAPPING Patient Name:  COCHISE DINNEEN  Date of Exam:   06/27/2024 Medical Rec #: [id number]      Accession #:    [id number] Date of Birth: [birth year]      Patient Gender: M Patient Age:   60 years Exam Location:  [facility] Procedure:      VAS US  UPPER EXT VEIN MAPPING (PRE-OP  AVF) Referring Phys: KATHERYN SABA --------------------------------------------------------------------------------  Indications: Pre-access. History: ESRD. CKDV, CVA, HTN, DM2, HLD.  Comparison Study: No prior study. Performing Technologist: Edilia Elden Appl  Examination Guidelines: A complete evaluation includes B-mode imaging, spectral Doppler, color Doppler, and power Doppler as needed of all accessible portions of  each vessel. Bilateral testing is considered an integral part of a complete examination. Limited examinations for reoccurring indications may be performed as noted. +-----------------+-------------+----------+-----------------------+ Right Cephalic   Diameter (cm)Depth (cm)       Findings         +-----------------+-------------+----------+-----------------------+ Shoulder             0.50        1.70                           +-----------------+-------------+----------+-----------------------+ Prox upper arm       0.52        0.87                           +-----------------+-------------+----------+-----------------------+ Mid upper arm        0.53        0.29                           +-----------------+-------------+----------+-----------------------+ Dist upper arm       0.42        0.52   branching and Thrombus. +-----------------+-------------+----------+-----------------------+ Antecubital fossa    0.49        0.40                           +-----------------+-------------+----------+-----------------------+ Prox forearm         0.35        0.49                           +-----------------+-------------+----------+-----------------------+ Mid forearm          0.35  0.25                           +-----------------+-------------+----------+-----------------------+ Dist forearm         0.40        0.16                           +-----------------+-------------+----------+-----------------------+ Wrist                0.29        0.18                           +-----------------+-------------+----------+-----------------------+ +-----------------+-------------+----------+---------+ Right Basilic    Diameter (cm)Depth (cm)Findings  +-----------------+-------------+----------+---------+ Prox upper arm       0.47                         +-----------------+-------------+----------+---------+ Mid upper arm        0.50                          +-----------------+-------------+----------+---------+ Dist upper arm       0.45               branching +-----------------+-------------+----------+---------+ Antecubital fossa    0.25                         +-----------------+-------------+----------+---------+ Prox forearm         0.30                         +-----------------+-------------+----------+---------+ Mid forearm          0.29                         +-----------------+-------------+----------+---------+ Distal forearm       0.23               branching +-----------------+-------------+----------+---------+ Elbow                0.33                         +-----------------+-------------+----------+---------+ Wrist                0.25                         +-----------------+-------------+----------+---------+ +-----------------+-------------+----------+---------+ Left Cephalic    Diameter (cm)Depth (cm)Findings  +-----------------+-------------+----------+---------+ Shoulder             0.50        1.36             +-----------------+-------------+----------+---------+ Prox upper arm       0.52        0.48             +-----------------+-------------+----------+---------+ Mid upper arm        0.54        0.27   branching +-----------------+-------------+----------+---------+ Dist upper arm       0.52        0.51   branching +-----------------+-------------+----------+---------+ Antecubital fossa    0.32        0.25             +-----------------+-------------+----------+---------+ Prox forearm  0.37        0.54   branching +-----------------+-------------+----------+---------+ Mid forearm          0.37        0.31             +-----------------+-------------+----------+---------+ Dist forearm         0.27        0.16             +-----------------+-------------+----------+---------+ Wrist                0.27        0.22              +-----------------+-------------+----------+---------+ +-----------------+-------------+----------+---------+ Left Basilic     Diameter (cm)Depth (cm)Findings  +-----------------+-------------+----------+---------+ Prox upper arm       0.55                         +-----------------+-------------+----------+---------+ Mid upper arm        0.57                         +-----------------+-------------+----------+---------+ Dist upper arm       0.40               branching +-----------------+-------------+----------+---------+ Antecubital fossa    0.34                         +-----------------+-------------+----------+---------+ Prox forearm         0.21                         +-----------------+-------------+----------+---------+ Mid forearm          0.17                         +-----------------+-------------+----------+---------+ Distal forearm       0.15                         +-----------------+-------------+----------+---------+ Elbow                0.31                         +-----------------+-------------+----------+---------+ Wrist                0.26                         +-----------------+-------------+----------+---------+ *See table(s) above for measurements and observations.  Diagnosing physician: Lonni Gaskins MD Electronically signed by Lonni Gaskins MD on 06/27/2024 at 1:34:10 PM.    Final    IR TUNNELED CENTRAL VENOUS CATH [facility] Result Date: 06/24/2024 INDICATION: End-stage renal disease and needs hemodialysis. EXAM: FLUOROSCOPIC AND ULTRASOUND GUIDED PLACEMENT OF A TUNNELED DIALYSIS CATHETER Physician: Juliene SAUNDERS. Philip, MD MEDICATIONS: Ancef  2 g; The antibiotic was administered within an appropriate time interval prior to skin puncture. ANESTHESIA/SEDATION: Moderate (conscious) sedation was employed during this procedure. A total of Versed  2 mg and fentanyl  100 mcg and Dilaudid  0.5 mg was administered intravenously at the order of  the provider performing the procedure. Total intra-service moderate sedation time: 21 minutes. Patient's level of consciousness and vital signs were monitored continuously by radiology nurse throughout the procedure under the supervision of the provider performing the procedure. FLUOROSCOPY TIME:  Radiation Exposure Index (as provided by the fluoroscopic device): 4 mGy Kerma COMPLICATIONS: None immediate. PROCEDURE: Informed consent was obtained for placement of a tunneled dialysis catheter. The patient was placed supine on the interventional table. Ultrasound confirmed a patent right internal jugular vein. Ultrasound image obtained for documentation. The right neck and chest was prepped and draped in a sterile fashion. Maximal barrier sterile technique was utilized including caps, mask, sterile gowns, sterile gloves, sterile drape, hand hygiene and skin antiseptic. The right neck was anesthetized with 1% lidocaine . A small incision was made with #11 blade scalpel. A 21 gauge needle directed into the right internal jugular vein with ultrasound guidance. A micropuncture dilator set was placed. A 23 cm tip to cuff Palindrome catheter was selected. The skin below the right clavicle was anesthetized and a small incision was made with an #11 blade scalpel. A subcutaneous tunnel was formed to the vein dermatotomy site. The catheter was brought through the tunnel. The vein dermatotomy site was dilated to accommodate a peel-away sheath over a wire. The catheter was placed through the peel-away sheath and directed into the central venous structures. The tip of the catheter was placed at superior cavoatrial junction with fluoroscopy. Fluoroscopic images were obtained for documentation. Both lumens were found to aspirate and flush well. The proper amount of heparin  was flushed in both lumens. The vein dermatotomy site was closed using a single layer of absorbable suture and Dermabond. Gel-Foam placed in the subcutaneous tract.  The catheter was secured to the skin using Prolene suture. IMPRESSION: Successful placement of a right jugular tunneled dialysis catheter using ultrasound and fluoroscopic guidance. Electronically Signed   By: Juliene Balder M.D.   On: 06/24/2024 17:31   US  RENAL Result Date: 06/23/2024 CLINICAL DATA:  [id number] Hematuria [id number]. EXAM: RENAL / URINARY TRACT ULTRASOUND COMPLETE COMPARISON:  CT scan abdomen and pelvis from 05/22/2024. FINDINGS: Right Kidney: Renal measurements: 6.1 x 6.8 x 11.8 cm = volume: 254.6 mL. Echogenicity within normal limits. No mass or hydronephrosis visualized. Left Kidney: Renal measurements: 7.2 x 7.5 x 12.4 cm = volume: 351.0 mL. Echogenicity within normal limits. No mass or hydronephrosis visualized. There is 8-9 mm thick hypo to anechoic rim around the left kidney, likely representing perirenal hemorrhage in the settings of recent renal biopsy. Bladder: Ureteric is distended. There is an approximately 7.3 x 9.4 cm avascular heterogeneous area in the urinary bladder, highly concerning for bladder hematoma. Other: None. IMPRESSION: 1. There is an approximately 7.3 x 9.4 cm avascular heterogeneous area in the urinary bladder, highly concerning for bladder hematoma. 2. There is 8-9 mm thick hypo to anechoic rim around the left kidney, likely representing perirenal hemorrhage in the settings of recent renal biopsy. 3. Otherwise unremarkable renal sonogram. Electronically Signed   By: Ree Molt M.D.   On: 06/23/2024 13:36   US  BIOPSY (KIDNEY) Result Date: 06/22/2024 CLINICAL DATA:  Proteinuria EXAM: ULTRASOUND GUIDED RENAL CORE BIOPSY COMPARISON:  CT 05/22/2024 TECHNIQUE: Survey ultrasound was performed and an appropriate skin entry site was localized. Site was marked, prepped with Betadine, draped in usual sterile fashion, infiltrated locally with 1% lidocaine . Intravenous Fentanyl  50mcg and Versed  2mg  were administered as conscious sedation during continuous monitoring of the patient's  level of consciousness and physiological / cardiorespiratory status by the radiology RN, with a total moderate sedation time of 11 minutes. Under real time ultrasound guidance, a 17 gauge trocar needle was advanced to the margin of the lower pole of the left kidney for 3  coaxial 18 gauge core biopsy needle passes. The core samples were submitted to pathology. The patient tolerated procedure well. COMPLICATIONS: None. IMPRESSION: 1. Technically successful ultrasound-guided core renal biopsy , left lower pole. Electronically Signed   By: JONETTA Faes M.D.   On: 06/22/2024 17:02   MR SACRUM SI JOINTS WO CONTRAST Result Date: 06/16/2024 CLINICAL DATA:  Acute onset groin pain. History of ankylosing spondylitis. EXAM: MRI SACRUM WITHOUT CONTRAST TECHNIQUE: Multiplanar, multisequence MR imaging of the sacroiliac joints was performed. No intravenous contrast was administered. COMPARISON:  CT pelvis 05/22/2024 and pelvic radiographs from 06/15/2024 FINDINGS: OSSEOUS STRUCTURES AND JOINTS: Symmetric marrow edema is identified along the sacroiliac joints especially anteriorly along the sacral ala and along adjacent portions of the iliac bones, with suspected erosions as suggested on image 14 series 2. The symmetric appearance favors sacroiliitis related to ankylosing spondylitis. Trace edema signal anterior to the SI joints on image 13 series 3 without overt SI joint effusion. Subtle endplate edema at O5-4 and L5-S1. Small hemangiomas in the L4 and L5 vertebral bodies. Marrow edema in the right ischium adjacent to tendinopathy and a suspected mild partial tear of the right hamstring tendon. MUSCULOTENDINOUS: Tendinopathy and potentially mild partial tearing of the right proximal hamstring tendon. Minimal tendinopathy of the left proximal hamstring tendon. Trace edema in the bilateral hip adductor musculature adjacent to the pubis as on image 37 series 5. SACRAL PLEXUS: No impinging lesion is identified involving the sacral plexus  or proximal sciatic nerves. OTHER: No supplemental non-categorized findings. IMPRESSION: 1. Symmetric marrow edema along the sacroiliac joints with suspected erosions, favoring sacroiliitis related to ankylosing spondylitis. 2. Tendinopathy and potentially mild partial tearing of the right proximal hamstring tendon. Minimal tendinopathy of the left proximal hamstring tendon. 3. Trace edema in the bilateral hip adductor musculature adjacent to the pubis. 4. Subtle endplate edema at O5-4 and L5-S1. Electronically Signed   By: Ryan Salvage M.D.   On: 06/16/2024 08:11   MR LUMBAR SPINE WO CONTRAST Result Date: 06/15/2024 EXAM: MRI LUMBAR SPINE 06/15/2024 06:24:00 PM TECHNIQUE: Multiplanar multisequence MRI of the lumbar spine was performed without the administration of intravenous contrast. COMPARISON: 07/23/2021 CLINICAL HISTORY: Myelopathy, acute, lumbar spine. Table formatting from the original note was not included. Acute onset groin pain, history of ankylosing spondylitis, unable to walk now. FINDINGS: BONES AND ALIGNMENT: Grade 1 retrolisthesis at L5-S1. SPINAL CORD: The conus terminates normally. SOFT TISSUES: No paraspinal mass. L1-L2: No significant disc herniation. No spinal canal stenosis or neural foraminal narrowing. L2-L3: No significant disc herniation. No spinal canal stenosis or neural foraminal narrowing. L3-L4: No significant disc herniation. No spinal canal stenosis or neural foraminal narrowing. L4-L5: Increased size of right subarticular disc extrusion with minimal inferior migration and narrowing of the right lateral recess. L5-S1: Unchanged small central disc protrusion. No central spinal canal stenosis. Unchanged moderate bilateral neuroforaminal stenosis. IMPRESSION: 1. L4-5: Increased size of right subarticular disc extrusion with minimal inferior migration and narrowing of the right lateral recess. 2. L5-S1: Unchanged small central disc protrusion without central spinal canal  stenosis. Unchanged moderate bilateral neuroforaminal stenosis. Electronically signed by: Franky Stanford MD 06/15/2024 08:20 PM EDT RP Workstation: [id number]   DG Pelvis Portable Result Date: 06/15/2024 CLINICAL DATA:  R hip pain EXAM: PORTABLE PELVIS 1-2 VIEWS COMPARISON:  May 22, 2024 FINDINGS: Unchanged appearance of the left iliac wing.No evidence of pelvic fracture or diastasis.No acute hip fracture or dislocation.There is no evidence of arthropathy or other focal bone abnormality.Soft tissues are unremarkable. IMPRESSION:  No acute fracture, pelvic bone diastasis, or dislocation. Electronically Signed   By: Rogelia Myers M.D.   On: 06/15/2024 12:45   DG Chest Portable 1 View Result Date: 06/15/2024 CLINICAL DATA:  weakness EXAM: PORTABLE CHEST - 1 VIEW COMPARISON:  June 22, 2015 FINDINGS: No focal airspace consolidation, pleural effusion, or pneumothorax. No cardiomegaly. Tortuous aorta with aortic atherosclerosis. No acute fracture or destructive lesions. Multilevel thoracic osteophytosis. IMPRESSION: No acute cardiopulmonary abnormality. Electronically Signed   By: Rogelia Myers M.D.   On: 06/15/2024 12:43   CT Head Wo Contrast Result Date: 06/15/2024 EXAM: CT HEAD WITHOUT CONTRAST 06/15/2024 10:51:48 AM TECHNIQUE: CT of the head was performed without the administration of intravenous contrast. Automated exposure control, iterative reconstruction, and/or weight based adjustment of the mA/kV was utilized to reduce the radiation dose to as low as reasonably achievable. COMPARISON: CT of the head dated 05/22/2024. CLINICAL HISTORY: Stroke, follow up; recent L thalamic stroke, worse R sided weakness. Triage notes; Pt bib PTAR from home. Complains of general weakness. Pt has been progressively getting too weak to walk. Around 0100 was the last time the pt states he was able to walk. 8/10 right groin pain that radiates to lower back. Hx of ankylosing spondylitis. FINDINGS: BRAIN AND VENTRICLES: No  acute hemorrhage. No evidence of acute infarct. No hydrocephalus. No extra-axial collection. No mass effect or midline shift. 2 focal lacunar infarcts present within the left thalamus are now slightly less conspicuous and are both seen on image 16 of series 3. ORBITS: No acute abnormality. SINUSES: No acute abnormality. SOFT TISSUES AND SKULL: No acute soft tissue abnormality. No skull fracture. VASCULATURE: Mild calcific atheromatous disease within the carotid siphons. IMPRESSION: 1. No acute intracranial abnormality or adverse interval change. 2. Slightly less conspicuous left thalamic lacunar infarcts compared to prior study dated 05/22/2024. Electronically signed by: Evalene Coho MD 06/15/2024 11:17 AM EDT RP Workstation: [id number]    Labs:  CBC: Recent Labs    06/27/24 0556 06/28/24 0351 06/28/24 1445 06/28/24 1932 06/29/24 0353 06/29/24 1855  WBC 14.3* 14.8* 15.0*  --  14.1*  --   HGB 8.1* 7.8* 8.4* 9.2* 8.0* 9.5*  HCT 25.4* 24.8* 26.5* 29.2* 25.2* 30.2*  PLT 295 295 310  --  287  --     COAGS: Recent Labs    06/15/24 1059 06/22/24 0510  INR 1.2 1.1  APTT 40*  --     BMP: Recent Labs    06/27/24 0556 06/28/24 0351 06/28/24 1505 06/29/24 0353  NA 138 138 136 135  K 4.4 4.0 4.6 3.7  CL 101 100 99 93*  CO2 25 27 27 26   GLUCOSE 177* 110* 208* 147*  BUN 80* 48* 52* 30*  CALCIUM  7.7* 8.0* 8.1* 7.8*  CREATININE 5.06* 3.99* 4.44* 3.23*  GFRNONAA 13* 17* 15* 22*    LIVER FUNCTION TESTS: Recent Labs    05/09/24 1159 05/22/24 0837 05/23/24 0130 06/15/24 1059 06/18/24 0632 06/27/24 0556 06/28/24 0351 06/28/24 1505 06/29/24 0353  BILITOT 0.6 0.5  --  0.6  --   --   --   --   --   AST 20 19  --  19  --   --   --   --   --   ALT 10 9  --  10  --   --   --   --   --   ALKPHOS 87 86  --  111  --   --   --   --   --  PROT 6.5 6.4*  --  7.3  --   --   --   --   --   ALBUMIN 2.7* 2.7*   < > 2.3*   < > 2.2* 2.1* 2.1* 2.0*   < > = values in this interval not  displayed.    TUMOR MARKERS: No results for input(s): AFPTM, CEA, CA199, CHROMGRNA in the last 8760 hours.  Assessment and Plan: Patient is known to IR service.  He underwent left renal biopsy on 9/8, and was subsequently noted with perirenal hematoma with gross hematuria.  Patient also received tunneled dialysis catheter by IR on 912, and subsequently initiated hemodialysis.  He was admitted with weakness, which was complicated by urinary retention requiring catheterization, and subsequent hematuria.  He has had worsening AKI now ESRD.  He was placed on CBI x 1 week, with persistent hematuria.  CT hematuria on 916 concerning for active bleed.  Patient is currently hemodynamically stable.  However, hemoglobin declined to a nadir of 7.8 on 916.  Currently 9.5.  Patient presents for scheduled left renal angiogram with possible embolization in IR today.  Patient has been NPO since midnight.  All labs and medications are within acceptable parameters.  Allergies Reviewed: Demerol [meperidine], Nsaids, Tapentadol, and Tramadol .  The Risks and benefits of embolization were discussed with the patient including, but not limited to bleeding, infection, vascular injury, post operative pain, or contrast induced renal failure.  This procedure involves the use of X-rays and because of the nature of the planned procedure, it is possible that we will have prolonged use of X-ray fluoroscopy.  Potential radiation risks to you include (but are not limited to) the following: - A slightly elevated risk for cancer several years later in life. This risk is typically less than 0.5% percent. This risk is low in comparison to the normal incidence of human cancer, which is 33% for women and 50% for men according to the [employer]. - Radiation induced injury can include skin redness, resembling a rash, tissue breakdown / ulcers and hair loss (which can be temporary or permanent).   The likelihood  of either of these occurring depends on the difficulty of the procedure and whether you are sensitive to radiation due to previous procedures, disease, or genetic conditions.   IF your procedure requires a prolonged use of radiation, you will be notified and given written instructions for further action.  It is your responsibility to monitor the irradiated area for the 2 weeks following the procedure and to notify your physician if you are concerned that you have suffered a radiation induced injury.    All of the patient's questions were answered, patient is agreeable to proceed. Consent signed and in chart.    Thank you for allowing our service to participate in Edward [name] 's care.  Electronically Signed: Carlin DELENA Griffon, PA-C   06/29/2024, 10:43 PM      I spent a total of 40 Minutes in face to face in clinical consultation, greater than 50% of which was counseling/coordinating care forperinephric hematoma with possible active bleeding, and with consideration for left renal angiogram with possible embolization.

## 2024-06-29 NOTE — Progress Notes (Signed)
 [facility]  Subjective  -no complaints   Objective 139/82 64 98.2 F (36.8 C) (Oral) 17 95%  Intake/Output Summary (Last 24 hours) at 06/29/2024 1034 Last data filed at 06/29/2024 0819 Gross per 24 hour  Intake 78669 ml  Output 12000 ml  Net 9330 ml    Right IJ TDC Left radial brachial pulse palpable  Laboratory Lab Results: Recent Labs    06/28/24 1445 06/28/24 1932 06/29/24 0353  WBC 15.0*  --  14.1*  HGB 8.4* 9.2* 8.0*  HCT 26.5* 29.2* 25.2*  PLT 310  --  287   BMET Recent Labs    06/28/24 1505 06/29/24 0353  NA 136 135  K 4.6 3.7  CL 99 93*  CO2 27 26  GLUCOSE 208* 147*  BUN 52* 30*  CREATININE 4.44* 3.23*  CALCIUM  8.1* 7.8*    COAG Lab Results  Component Value Date   INR 1.1 06/22/2024   INR 1.2 06/15/2024   No results found for: PTT  Assessment/Planning:  53 year old male with ESRD the vascular surgery was consulted for permanent dialysis access.  Plan left arm AV fistula today.  Has usable vein on vein mapping.  Discussed with IR with plans for embolization tomorrow of his perinephric hematoma if ongoing hematuria.  Hemodynamically stable.  Okay to proceed with fistula placement after discussion with multiple teams.  Will use no systemic heparin  with just local injection in the vessel.  All questions answered.  Edward [name] 06/29/2024 10:34 AM --

## 2024-06-29 NOTE — Assessment & Plan Note (Addendum)
 Dark red urine. CT Hematuria work up on 06/28/24 shows 7.5 x 4.6 x 3.0 cm left posteroinferior perinephric hematoma with a linear track of contrast extending from the left lower pole parenchyma posteriorly into the hematoma consistent with an active bleed. Stable H/H  w/o blood transfusion. Not a candidate for TXA given recent CVA.  If worsening, consider IR embolization. Fail void trial on 9/16. Place new foley w/ Caude 19F w/ 1050 ml dark red bloody urine output then replaced foley overnight for 3 way foley for CBI - Urology consult, appreciate recommendations - IR consulted, appreciate recommendations - CBC in PM - May go to OR with IR for embolization pending progress  - Continue foley w/ CBI per Urology - Strict I&O - Monitor CBC PM and UOP - Continue Flomax  0.4 mg Daily

## 2024-06-29 NOTE — Transfer of Care (Signed)
 Immediate Anesthesia Transfer of Care Note  Patient: Edward [name]  Procedure(s) Performed: LEFT BRACHIOCEPHALIC ARTERIOVENOUS (AV) FISTULA CREATION (Left: Arm Upper)  Patient Location: PACU  Anesthesia Type:General  Level of Consciousness: awake, alert , and oriented  Airway & Oxygen Therapy: Patient Spontanous Breathing  Post-op Assessment: Report given to RN and Post -op Vital signs reviewed and stable  Post vital signs: Reviewed and stable  Last Vitals:  Vitals Value Taken Time  BP 135/71 06/29/24 13:06  Temp    Pulse 70 06/29/24 13:08  Resp 17 06/29/24 13:08  SpO2 100 % 06/29/24 13:08  Vitals shown include unfiled device data.  Last Pain:  Vitals:   06/29/24 0946  TempSrc: Oral  PainSc: 0-No pain      Patients Stated Pain Goal: 3 (06/16/24 1136)  Complications: No notable events documented.

## 2024-06-29 NOTE — Assessment & Plan Note (Addendum)
 CVA 4 weeks ago, on ASA prior to admission. ASA then was stopped for ASA washout pre-kidney biopsy (9/5 - 9/10) and continue to hold due  to hematoma/hematuria  - Neurology weighed in on 9/13- hold aspirin  until bleeding resolved  - Continue to hold ASA and SQH d/t remaining w/ blooding urine after replaced foley - Continue statin, ezetimibe 

## 2024-06-29 NOTE — Assessment & Plan Note (Addendum)
 Well-controlled on PO pain regimen. - Continue prednisone  taper - Pain regimen: - Acetaminophen  650 mg every 8 hours scheduled - Oxycodone  5-10 mg every 4 hours as needed for moderate/severe pain - Lidocaine  patches - Tizanidine  4 mg every 8 hours as needed - Need outpatient f/u with Rheumatology  - PT/OT follow

## 2024-06-29 NOTE — Progress Notes (Addendum)
 Navigator advised late yesterday afternoon that there are barriers with pt's insurance for out-pt HD. [facility] needs to complete a financial assessment for financial clearance for out-pt HD at d/c. [facility] financial staff have tried to reach pt and pt's mother but have been unsuccessful. Navigator spoke to pt this morning to make him aware of the above info and for pt to please contact the financial staff that has left messages for him and his mother as soon as possible. Pt voices understanding. Update provided to nephrologist as well. Pt will not be able to start out-pt HD until financial clearance received from [facility]. Will assist as needed.   Randine Mungo Dialysis Navigator [phone]  Addendum at 9:35 am: Received a call from [facility] financial staff saying they spoke to pt's mother yesterday and pt has been financially cleared for out-pt HD at d/c. Navigator has reached out to [facility] to inquire if pt would be able to start on Saturday if stable for d/c by then. Will await a response.   Addendum at 9:41 am: Spoke to pt's mother via phone to be advised pt has been financially cleared for out-pt HD at d/c with [facility]. Nephrologist made aware of this info as well.   Addendum at 11:15 am: Clinic is able to start pt on Saturday if pt able to go to clinic on Friday to complete paperwork.

## 2024-06-29 NOTE — Progress Notes (Signed)
 PT Cancellation Note  Patient Details Name: Edward [name] MRN: [id number] DOB: [birth year]   Cancelled Treatment:    Reason Eval/Treat Not Completed: Patient at procedure or test/unavailable. Transport present to take pt off unit. Noted pt for 2 procedures today. Will check back as schedule allows to continue with PT POC.    Leita JONETTA Sable 06/29/2024, 9:24 AM  Leita Sable, PT, DPT [facility] Secure Chat Preferred Office: [phone]

## 2024-06-29 NOTE — Anesthesia Preprocedure Evaluation (Signed)
 Anesthesia Evaluation  Patient identified by MRN, date of birth, ID band Patient awake    Reviewed: Allergy & Precautions, NPO status , Patient's Chart, lab work & pertinent test results  History of Anesthesia Complications Negative for: history of anesthetic complications  Airway Mallampati: III  TM Distance: >3 FB Neck ROM: Full    Dental  (+) Dental Advisory Given, Teeth Intact   Pulmonary neg shortness of breath, neg COPD, neg recent URI, former smoker   breath sounds clear to auscultation       Cardiovascular hypertension, Pt. on medications and Pt. on home beta blockers (-) angina (-) Past MI and (-) CHF  Rhythm:Regular   1. Left ventricular ejection fraction, by estimation, is 60 to 65%. The  left ventricle has normal function. The left ventricle has no regional  wall motion abnormalities. Left ventricular diastolic parameters were  normal.   2. Right ventricular systolic function is normal. The right ventricular  size is normal.   3. The mitral valve is normal in structure. No evidence of mitral valve  regurgitation. No evidence of mitral stenosis.   4. The aortic valve is tricuspid. Aortic valve regurgitation is not  visualized. No aortic stenosis is present.   5. The inferior vena cava is normal in size with greater than 50%  respiratory variability, suggesting right atrial pressure of 3 mmHg.   6. Agitated saline contrast bubble study was negative, with no evidence  of any interatrial shunt.     Neuro/Psych  Headaches    GI/Hepatic negative GI ROS, Neg liver ROS,,,  Endo/Other  diabetes    Renal/GU ESRF and DialysisRenal diseaseLab Results      Component                Value               Date                      NA                       135                 06/29/2024                K                        3.7                 06/29/2024                CO2                      26                  06/29/2024                 GLUCOSE                  147 (H)             06/29/2024                BUN                      30 (H)              06/29/2024  CREATININE               3.23 (H)            06/29/2024                CALCIUM                   7.8 (L)             06/29/2024                EGFR                     18 (L)              05/06/2024                GFRNONAA                 22 (L)              06/29/2024                 Musculoskeletal  (+) Arthritis ,    Abdominal   Peds  Hematology  (+) Blood dyscrasia, anemia Lab Results      Component                Value               Date                      WBC                      14.1 (H)            06/29/2024                HGB                      8.0 (L)             06/29/2024                HCT                      25.2 (L)            06/29/2024                MCV                      90.0                06/29/2024                PLT                      287                 06/29/2024              Anesthesia Other Findings   Reproductive/Obstetrics                              Anesthesia Physical Anesthesia Plan  ASA: 3  Anesthesia Plan: General   Post-op Pain Management: Minimal or no pain anticipated   Induction: Intravenous  PONV Risk Score and Plan: 2  and Ondansetron  and Dexamethasone   Airway Management Planned: LMA and Oral ETT  Additional Equipment: None  Intra-op Plan:   Post-operative Plan: Extubation in OR  Informed Consent: I have reviewed the patients History and Physical, chart, labs and discussed the procedure including the risks, benefits and alternatives for the proposed anesthesia with the patient or authorized representative who has indicated his/her understanding and acceptance.     Dental advisory given  Plan Discussed with: CRNA  Anesthesia Plan Comments:          Anesthesia Quick Evaluation

## 2024-06-29 NOTE — Progress Notes (Signed)
 Urology Inpatient Progress Report   Intv/Subj: CT abdomen/pelvis last night showed a large left perinephric hematoma with concern for active extravasation.  Hgb 8.0.  IR was consulted and recommended continued conservative measure with CBI unless patient clinically declines.  Principal Problem:   Back pain Active Problems:   Chronic health problem   Chronic pain   Ankylosing spondylitis flare   ESRD (end stage renal disease) on dialysis (HCC)   Hyperkalemia   Constipation   Gross hematuria   H/O: CVA (cerebrovascular accident)   Urinary retention   Perinephric hematoma  Current Facility-Administered Medications  Medication Dose Route Frequency Provider Last Rate Last Admin   0.9 %  sodium chloride  infusion   Intravenous Continuous Leopoldo Bruckner, MD 10 mL/hr at 06/29/24 0956 Restarted at 06/29/24 1131   acetaminophen  (OFIRMEV ) IV 1,000 mg  1,000 mg Intravenous Once PRN Leopoldo Bruckner, MD       [facility] Hold] acetaminophen  (TYLENOL ) tablet 650 mg  650 mg Oral Q8H Theophilus Pagan, MD   650 mg at 06/29/24 0532   [MAR Hold] amLODipine  (NORVASC ) tablet 10 mg  10 mg Oral Daily Theophilus Pagan, MD   10 mg at 06/28/24 0947   [MAR Hold] atorvastatin  (LIPITOR) tablet 40 mg  40 mg Oral Daily Theophilus Pagan, MD   40 mg at 06/28/24 0948   [MAR Hold] bacitracin  ointment   Topical TID PRN Delia Ole ORN, NP       [MAR Hold] carvedilol  (COREG ) tablet 3.125 mg  3.125 mg Oral BID Theophilus Pagan, MD   3.125 mg at 06/29/24 0916   [MAR Hold] Chlorhexidine  Gluconate Cloth 2 % PADS 6 each  6 each Topical Q0600 Dolan Mateo Larger, MD   6 each at 06/29/24 0541   [MAR Hold] Darbepoetin Alfa  (ARANESP ) injection 60 mcg  60 mcg Subcutaneous Q Wed-1800 Foster, Lori C, MD       [MAR Hold] ezetimibe  (ZETIA ) tablet 10 mg  10 mg Oral Daily Theophilus Pagan, MD   10 mg at 06/28/24 0947   [MAR Hold] feeding supplement (NEPRO CARB STEADY) liquid 237 mL  237 mL Oral BID Delores Suzann HERO, MD   237 mL at  06/28/24 2317   fentaNYL  (SUBLIMAZE ) injection 25-50 mcg  25-50 mcg Intravenous Q5 min PRN Leopoldo Bruckner, MD   50 mcg at 06/29/24 1531   [MAR Hold] ferrous sulfate  tablet 325 mg  325 mg Oral QODAY Hernandez, Katie, MD   325 mg at 06/27/24 0811   [facility] Hold] insulin  aspart (novoLOG ) injection 0-15 Units  0-15 Units Subcutaneous TID WC Gomes, Adriana, DO   2 Units at 06/28/24 1834   insulin  aspart (novoLOG ) injection 0-7 Units  0-7 Units Subcutaneous Q2H PRN Leopoldo Bruckner, MD       [START ON 06/30/2024] insulin  glargine (LANTUS ) injection 5 Units  5 Units Subcutaneous Daily Delores Suzann HERO, MD       [facility] Hold] lidocaine  (LIDODERM ) 5 % 2 patch  2 patch Transdermal Q24H Pruett, Milda CROME, MD   2 patch at 06/28/24 0948   [MAR Hold] lidocaine  (XYLOCAINE ) 2 % jelly 1 Application  1 Application Topical Once Brown, Carina M, MD       [facility] Hold] multivitamin (RENA-VIT) tablet 1 tablet  1 tablet Oral QHS Brown, Carina M, MD   1 tablet at 06/28/24 2314   [MAR Hold] oxyCODONE  (Oxy IR/ROXICODONE ) immediate release tablet 5 mg  5 mg Oral Q4H PRN Theophilus Pagan, MD   5 mg at 06/26/24 9085   Or   [  MAR Hold] oxyCODONE  (Oxy IR/ROXICODONE ) immediate release tablet 10 mg  10 mg Oral Q4H PRN Theophilus Pagan, MD   10 mg at 06/28/24 2314   oxyCODONE  (Oxy IR/ROXICODONE ) immediate release tablet 5 mg  5 mg Oral Once PRN Leopoldo Bruckner, MD       Or   oxyCODONE  (ROXICODONE ) 5 MG/5ML solution 5 mg  5 mg Oral Once PRN Leopoldo Bruckner, MD       [MAR Hold] pantoprazole  (PROTONIX ) EC tablet 40 mg  40 mg Oral Daily Theophilus Pagan, MD   40 mg at 06/28/24 0947   [MAR Hold] polyethylene glycol (MIRALAX  / GLYCOLAX ) packet 17 g  17 g Oral BID Suknaim, Kulkaew B, DO   17 g at 06/27/24 0810   [MAR Hold] QUEtiapine  (SEROQUEL ) tablet 50 mg  50 mg Oral QHS Tharon Lung, MD   50 mg at 06/28/24 2314   senna (SENOKOT) tablet 17.2 mg  2 tablet Oral BID Larraine Palma, MD       sodium chloride  irrigation 0.9 % 3,000 mL   3,000 mL Irrigation Continuous Sattenfield, Cameron W, NP   3,000 mL at 06/29/24 1422   [MAR Hold] tamsulosin  (FLOMAX ) capsule 0.4 mg  0.4 mg Oral Daily Gomes, Adriana, DO   0.4 mg at 06/28/24 0946   [MAR Hold] tiZANidine  (ZANAFLEX ) tablet 4 mg  4 mg Oral Q8H PRN Theophilus Pagan, MD   4 mg at 06/27/24 1944     Objective: Vital: Vitals:   06/29/24 1515 06/29/24 1530 06/29/24 1600 06/29/24 1630  BP: (!) 148/73 (!) 159/84 120/66 (!) 148/84  Pulse: 74 82 65 79  Resp: 14 12 13 20   Temp:  98.2 F (36.8 C)    TempSrc:      SpO2: 98% 96% 95% 96%  Weight:      Height:       I/Os: I/O last 3 completed shifts: In: 78429 [P.O.:570; Other:21000] Out: 89999 [Urine:9000; Other:1000]  Physical Exam:  General: Patient is in no apparent distress Lungs: Normal respiratory effort, chest expands symmetrically. JP drain with serosanguinous drainage Foley: 18 French three-way Foley catheter with CBI on fast drip, draining clear yellow urine Ext: lower extremities symmetric  Lab Results: Recent Labs    06/28/24 0351 06/28/24 1445 06/28/24 1932 06/29/24 0353  WBC 14.8* 15.0*  --  14.1*  HGB 7.8* 8.4* 9.2* 8.0*  HCT 24.8* 26.5* 29.2* 25.2*   Recent Labs    06/28/24 0351 06/28/24 1505 06/29/24 0353  NA 138 136 135  K 4.0 4.6 3.7  CL 100 99 93*  CO2 27 27 26   GLUCOSE 110* 208* 147*  BUN 48* 52* 30*  CREATININE 3.99* 4.44* 3.23*  CALCIUM  8.0* 8.1* 7.8*   No results for input(s): LABPT, INR in the last 72 hours. No results for input(s): LABURIN in the last 72 hours. Results for orders placed or performed in visit on 10/19/19  Novel Coronavirus, NAA ([facility])     Status: Abnormal   Collection Time: 10/19/19 11:13 AM   Specimen: Nasopharyngeal(NP) swabs in vial transport medium   NASOPHARYNGE  TESTING  Result Value Ref Range Status   SARS-CoV-2, NAA Detected (A) Not Detected Final    Comment: This nucleic acid amplification test was developed and its  performance characteristics determined by [employer]. Nucleic acid amplification tests include PCR and TMA. This test has not been FDA cleared or approved. This test has been authorized by FDA under an Emergency Use Authorization (EUA). This test is only authorized for the  duration of time the declaration that circumstances exist justifying the authorization of the emergency use of in vitro diagnostic tests for detection of SARS-CoV-2 virus and/or diagnosis of COVID-19 infection under section 564(b)(1) of the Act, 21 U.S.C. 639aaa-6(a) (1), unless the authorization is terminated or revoked sooner. When diagnostic testing is negative, the possibility of a false negative result should be considered in the context of a patient's recent exposures and the presence of clinical signs and symptoms consistent with COVID-19. An individual without symptoms of COVID-19 and who is not shedding SARS-CoV-2 virus would  expect to have a negative (not detected) result in this assay.     Studies/Results: CT HEMATURIA WORKUP Addendum Date: 06/28/2024 ADDENDUM REPORT: 06/28/2024 23:50 ADDENDUM: Critical Value/emergent results were called by telephone at the time of interpretation on 06/28/2024 at 11:36 pm to provider [facility] [place] , who verbally acknowledged these results. Electronically Signed   By: Francis Quam M.D.   On: 06/28/2024 23:50   Result Date: 06/28/2024 CLINICAL DATA:  The patient had a left renal biopsy on August 10 for history of chronic kidney disease stage 4, and has been having hematuria and weakness since then. EXAM: CT ABDOMEN AND PELVIS WITHOUT AND WITH CONTRAST TECHNIQUE: Multidetector CT imaging of the abdomen and pelvis was performed following the standard protocol before and following the bolus administration of intravenous contrast. RADIATION DOSE REDUCTION: This exam was performed according to the departmental dose-optimization program which includes automated exposure  control, adjustment of the mA and/or kV according to patient size and/or use of iterative reconstruction technique. CONTRAST:  OMNIPAQUE  IOHEXOL  350 MG/ML SOLN COMPARISON:  The only comparison CT is CT without contrast before the biopsy on 05/22/2024. The biopsy was done under ultrasound. FINDINGS: Lower chest: With noncontrast imaging, the cardiac blood pool has become less dense than the myocardium consistent with anemia. The cardiac size is normal. There are scattered calcifications in the lower mitral ring. No pericardial effusion. Lung bases are clear of infiltrates. There is eventration and mild elevation anterior right diaphragm. Increased linear atelectasis right lower lobe posterior base. Hepatobiliary: Solitary 2 cm rim calcified stone in the proximal gallbladder. No wall thickening or bile duct dilatation. The liver is 22 cm in length and mildly steatotic. There is no mass enhancement. Pancreas: Partially atrophic. No mass common ductal dilatation or inflammatory change. Spleen: No mass.  No splenomegaly. Adrenals/Urinary Tract: There is no adrenal or renal mass enhancement. No urinary stone or obstruction. Diminished and delayed excretion is noted bilaterally and symmetrically. There is an irregular posteroinferior left perinephric hematoma, maximum diameters of 7.5 cm transverse, 4.6 cm AP, and 3.0 cm craniocaudal, with minimal nonlocalizing hemorrhagic content layering posteriorly along the pararenal fascia. There is a linear track of contrast extending from the left lower pole parenchyma posteriorly into the hematoma on series 7 axial images 45-47 consistent with an active bleed. In the bladder, there is trace air anteriorly which may be from catheterization. Foley catheter is in place. New heterogeneous material collects posteriorly in the bladder which is probably clotted blood and was not seen previously. There is mild thickening of the bladder versus underdistention. Correlate with  urinalysis for possible cystitis. Some perivesical stranding does appear present along the bladder dome. Stomach/Bowel: No dilatation or wall thickening including the appendix. No mesenteric inflammatory change. Remote Roux-en-Y gastric bypass. Surgically widened small bowel segment again noted anterior mid abdomen. Moderate-to-large amount of retained stool again noted ascending, transverse and descending colon, increased. There is sigmoid diverticulosis without diverticulitis.  Vascular/Lymphatic: Versus in the abdominal wall are noted emptying in the common femoral veins. There is a prominent hepatic portal vein measuring 16 mm, stable. No portal vein thrombosis. Aortic atherosclerosis. No aneurysm or dissection. Shotty subcentimeter retroperitoneal lymph nodes are unchanged. No enlarged abdominopelvic nodes. Reproductive: Enlarged prostate 5.4 cm transverse axis. Other: There are small inguinal fat hernias. No incarcerated hernia. No hemorrhage is seen apart from the left perinephric space bleed. No free air. Minimal presacral ascites increased since August 10. Musculoskeletal: Mild degenerative change lumbar spine. Pelvic enthesopathy. Partial ankylosis of both SI joints. L4-5 right paracentral disc protrusion again noted with mass effect on the right L5 nerve root. IMPRESSION: 1. 7.5 x 4.6 x 3.0 cm left posteroinferior perinephric hematoma with a linear track of contrast extending from the left lower pole parenchyma posteriorly into the hematoma consistent with an active bleed. 2. Diminished and delayed renal excretion bilaterally and symmetrically, consistent with history of stage IV CKD. 3. New heterogeneous material in the bladder probably clotted blood. 4. Cystitis versus bladder nondistention. 5. Constipation and diverticulosis. 6. Cholelithiasis. 7. Aortic atherosclerosis. 8. Anemia, new since August 10. 9. L4-5 right paracentral disc protrusion with mass effect on the right L5 nerve root. 10. PRA is  attempting to reach the ordering physician for stat notification at the time of signing. Aortic Atherosclerosis (ICD10-I70.0). Electronically Signed: By: Francis Quam M.D. On: 06/28/2024 23:32    Assessment/Plan: Gross hematuria secondary to renal biopsy Clot urinary retention -18Fr 3 way foley with CBI draining clear urine. Decrease speed to moderate-slow drip.  He has been on CBI for almost 1 week now. -NPO at midnight and if Hgb declines/hematuria continues then plan for IR intervention tomorrow   Valli Shank, MD Urology 06/29/2024, 4:54 PM

## 2024-06-29 NOTE — Anesthesia Procedure Notes (Signed)
 Procedure Name: LMA Insertion Date/Time: 06/29/2024 11:50 AM  Performed by: Zelphia Norleen HERO, CRNAPre-anesthesia Checklist: Patient identified, Emergency Drugs available, Suction available and Patient being monitored Patient Re-evaluated:Patient Re-evaluated prior to induction Oxygen Delivery Method: Circle system utilized Preoxygenation: Pre-oxygenation with 100% oxygen Induction Type: IV induction Ventilation: Mask ventilation without difficulty LMA: LMA inserted LMA Size: 5.0 Tube type: Oral Number of attempts: 1 Placement Confirmation: positive ETCO2 and breath sounds checked- equal and bilateral Tube secured with: Tape Dental Injury: Teeth and Oropharynx as per pre-operative assessment

## 2024-06-29 NOTE — Assessment & Plan Note (Addendum)
 HTN: Continue home amLODipine  10 mg daily, Carvedilol  3.125 mg BID DMII: sSSI, Glargine 8 units started 9/14. Will plan on keep his insulin  as is for now as he will completed his Prednisolone treatment today HLD: Continue Ezetimibe  10 mg daily Bipolar 1 disorder: Increase QUEtiapine  25 mg to 50 mg at bedtime since buspirone  is now stopped. Will repeat EKG tmrw morning Depression: Stopped Buspar  7.5 mg daily  Constipation: Continue MiraLAX  twice daily, senna daily : Stool burden on CT

## 2024-06-29 NOTE — Op Note (Signed)
 OPERATIVE NOTE   DATE: June 29, 2024  PROCEDURE: left brachiocephalic arteriovenous fistula placement  PRE-OPERATIVE DIAGNOSIS: ESRD  POST-OPERATIVE DIAGNOSIS: same as above   SURGEON: Lonni DOROTHA Gaskins, MD  ASSISTANT(S): Ahmed Holster, PA  ANESTHESIA: LMA  ESTIMATED BLOOD LOSS: Minimal  FINDING(S): 1.  Cephalic vein: 4 mm, acceptable 2.  Brachial artery: 4.5 mm, atherosclerotic disease evident 3.  Venous outflow: palpable thrill  4.  Radial flow: palpable radial pulse  SPECIMEN(S):  none  INDICATIONS:   Edward [name] is a 53 y.o. male who presents with ESRD and the need for permanent hemodialysis access.  The patient is scheduled for left arm arteriovenous fistula placement.  The patient is aware the risks include but are not limited to: bleeding, infection, steal syndrome, nerve damage, ischemic monomelic neuropathy, failure to mature, and need for additional procedures.  The patient is aware of the risks of the procedure and elects to proceed forward.  An assistant was needed for the anastomosis and given the complexity of the case.   DESCRIPTION: After full informed written consent was obtained from the patient, the patient was brought back to the operating room and placed supine upon the operating table.  Prior to induction, the patient received IV antibiotics.   After obtaining adequate anesthesia, the patient was then prepped and draped in the standard fashion for a left arm access procedure.  I turned my attention first to identifying the patient's cephalic vein and brachial artery.  Using SonoSite guidance, the location of these vessels were marked out on the skin.  These were of good caliber at the elbow.   At this point, I injected local anesthetic to obtain a field block of the antecubitum.  In total, I injected about 10 mL of 1% lidocaine  without epinephrine .  I made a transverse incision at the level of the antecubitum and dissected through the  subcutaneous tissue and fascia to gain exposure of the brachial artery.  This was noted to be 4.5 mm in diameter externally.  This was dissected out proximally and distally and controlled with vessel loops .  I then dissected out the cephalic vein.  This was noted to be 4 mm in diameter externally.  The distal segment of the vein was ligated with a  2-0 silk, and the vein was transected.  The proximal segment was interrogated with serial dilators.  The vein accepted up to a 4.5 mm dilator without any difficulty.  I then instilled the heparinized saline into the vein and clamped it.  At this point, I reset my exposure of the brachial artery and placed the artery under tension proximally and distally.  I made an arteriotomy with a #11 blade, and then I extended the arteriotomy with a Potts scissor.  I injected heparinized saline proximal and distal to this arteriotomy.  The vein was then sewn to the artery in an end-to-side configuration with a running stitch of 6-0 Prolene with the help of my assistant.  Prior to completing this anastomosis, I allowed the vein and artery to backbleed.  There was no evidence of clot from any vessels.  I completed the anastomosis in the usual fashion and then released all vessel loops and clamps.    There was a palpable thrill in the venous outflow, and there was a palpable radial pulse.  At this point, I irrigated out the surgical wound.  There was no further active bleeding.  The subcutaneous tissue was reapproximated with a running stitch of 3-0 Vicryl.  The skin was then reapproximated with a running subcuticular stitch of 4-0 Monocryl.  The skin was then cleaned, dried, and reinforced with Dermabond.  The patient tolerated this procedure well.   COMPLICATIONS: None  CONDITION: Stable  Lonni DOROTHA Gaskins, MD [facility] Office: [phone]  Lonni JINNY Gaskins   06/29/2024, 12:50 PM

## 2024-06-29 NOTE — Significant Event (Signed)
 Contacted by radiology for critical read on CT hematuria. Patient appeared to have active bleed with perinephric hematoma following recent renal biopsy. Reviewed imaging with Interventional Radiology on-call. IR recommended the Nursing contact the on-call if patient shows any sign of hemodynamic decline. His pager number was provided to Nursing. Considering patients hemodynamic stability and improving hemoglobin, it was thought that intervention could wait for the morning. Reviewed with nursing who confirmed patient was stable. Ordered hand irrigation had not been performed and catheter had low output. Contacted fourth floor to place three-way catheter. Nursing will hand irrigate clot material demonstrated on CT hematuria and restart CBI afterwards. IR consult for embolization tomorrow has been placed. This will presumably need to be addressed in advance of his graft in the morning. Please contact urology on call for further questions or concerns.   Ole Bourdon, NP [facility] Pager: [phone]

## 2024-06-29 NOTE — Progress Notes (Addendum)
 [facility] NEPHROLOGY PROGRESS NOTE  Assessment/ Plan: Pt is a 53 y.o. yo male    # New ESRD - Patient with CKD5 progressed to new ESRD due to biopsy proven advanced diabetic glomerulosclerosis class IV, severe arterionephrosclerosis, secondary FSGS with severe interstitial fibrosis and tubular atrophy.  Rapidly progressive CKD.  UPCR almost 14 g. OP labs including ANCA, ASO, complement level, anti dsDNA ab, Hep B, Hep C, anti-GBM, anti-PLA2R ab negative. He is s/p kidney biopsy on 9/10 with DM and significant scarring as above.  S/P RIJ TDC followed by first HD on 9/12. --------------------- - HD per TTS schedule  - We are awaiting final placement at an outpatient HD unit - there is an insurance issue - In the longer-term, he is interested in home therapy and kidney transplant evaluation. - Appreciate vascular surgery - they plan for AVF vs. AV graft today vs. This week depending on IR plans   # Bleeding from HD catheter site requiring prolonged pressure by the bedside nurse.  IR evaluated.  S/p DDAVP  and resolved  # Gross hematuria after kidney biopsy:  - Reviewed kidney ultrasound results.  Seen by urology.   - Foley catheter was placed again and patient with CBI - Urology is following and has consulted IR for embolization for perinephric hematoma  #Anemia of CKD and acute blood loss anemia due to hematuria. - Iron sat 51 %, received Aranesp  on 9/10, worsening anemia due to hematuria/acute blood loss.   - Scheduled aranesp  60 mcg every Wednesday for now  - Transfuse PRBC as needed.   #Ankylosing spondylitis - With flare - Per primary, on prednisone  with dose taper per team  - noted GI ppx   #Hyperkalemia - Renal diet - s/p Lokelma  and now on dialysis.  # Metabolic acidosis - off of sodium bicarbonate , now managed with HD.   #Hypertension - BP currently acceptable on current regimen - avoid hypotension  Disposition - continue inpatient monitoring.  He  still needs an outpatient dialysis unit finalized - there is an insurance issue.  Note also IR and vascular procedures planned   Katheryn [name], MD 9:27 AM 06/29/2024     Subjective:  Last HD on 9/16 with 1 kg UF.  Spoke with vascular surgery this AM.  Urology has consulted IR for embolization for a perinephric hematoma.  Vascular plans for AVF vs. AV graft today vs this week depending on IR plans.  UOP inaccurate as back on CBI with foley in place.  Spoke with his mother at bedside  Review of systems:    Denies shortness of breath or chest pain  Denies n/v   Objective Vital signs in last 24 hours: Vitals:   06/28/24 1828 06/28/24 2158 06/29/24 0641 06/29/24 0800  BP: 139/74 133/82 109/70 120/75  Pulse: 70 77 60 (!) 58  Resp: 17 18 18 17   Temp: 98.6 F (37 C) 99.1 F (37.3 C) (!) 97.4 F (36.3 C) (!) 97.5 F (36.4 C)  TempSrc:  Oral Oral Oral  SpO2: 95% 96% 98% 96%  Weight:      Height:       Weight change: -1.3 kg  Intake/Output Summary (Last 24 hours) at 06/29/2024 0927 Last data filed at 06/29/2024 0819 Gross per 24 hour  Intake 78669 ml  Output 12000 ml  Net 9330 ml     Labs: RENAL PANEL Recent Labs  Lab 06/26/24 0312 06/27/24 0556 06/28/24 0351 06/28/24 1505 06/29/24 0353  NA 134* 138 138 136 135  K 3.8 4.4 4.0 4.6 3.7  CL 100 101 100 99 93*  CO2 24 25 27 27 26   GLUCOSE 252* 177* 110* 208* 147*  BUN 72* 80* 48* 52* 30*  CREATININE 4.27* 5.06* 3.99* 4.44* 3.23*  CALCIUM  7.4* 7.7* 8.0* 8.1* 7.8*  PHOS 3.3 4.3 3.9 4.0 3.4  ALBUMIN 2.0* 2.2* 2.1* 2.1* 2.0*    Liver Function Tests: Recent Labs  Lab 06/28/24 0351 06/28/24 1505 06/29/24 0353  ALBUMIN 2.1* 2.1* 2.0*   No results for input(s): LIPASE, AMYLASE in the last 168 hours. No results for input(s): AMMONIA in the last 168 hours. CBC: Recent Labs    03/21/24 1037 05/09/24 1159 06/19/24 0437 06/22/24 0510 06/27/24 0556 06/27/24 2052 06/28/24 0351 06/28/24 1445 06/28/24 1932  06/29/24 0353  HGB 11.3*   < >  --    < > 8.1*  --  7.8* 8.4* 9.2* 8.0*  MCV 93   < >  --    < > 89.1  --  89.5 90.8  --  90.0  VITAMINB12  --   --   --   --   --  518  --   --   --   --   FOLATE  --   --   --   --   --  12.8  --   --   --   --   FERRITIN 60  --  120  --   --   --   --   --   --   --   TIBC 298  --  209*  --   --   --   --   --   --   --   IRON 52  --  107  --   --   --   --   --   --   --    < > = values in this interval not displayed.    Cardiac Enzymes: No results for input(s): CKTOTAL, CKMB, CKMBINDEX, TROPONINI in the last 168 hours.  CBG: Recent Labs  Lab 06/28/24 1124 06/28/24 1826 06/28/24 2140 06/29/24 0558 06/29/24 0911  GLUCAP 204* 125* 194* 116* 112*    Iron Studies:  No results for input(s): IRON, TIBC, TRANSFERRIN, FERRITIN in the last 72 hours.  Studies/Results: CT HEMATURIA WORKUP Addendum Date: 06/28/2024 ADDENDUM REPORT: 06/28/2024 23:50 ADDENDUM: Critical Value/emergent results were called by telephone at the time of interpretation on 06/28/2024 at 11:36 pm to provider [facility] [place] , who verbally acknowledged these results. Electronically Signed   By: Francis Quam M.D.   On: 06/28/2024 23:50   Result Date: 06/28/2024 CLINICAL DATA:  The patient had a left renal biopsy on August 10 for history of chronic kidney disease stage 4, and has been having hematuria and weakness since then. EXAM: CT ABDOMEN AND PELVIS WITHOUT AND WITH CONTRAST TECHNIQUE: Multidetector CT imaging of the abdomen and pelvis was performed following the standard protocol before and following the bolus administration of intravenous contrast. RADIATION DOSE REDUCTION: This exam was performed according to the departmental dose-optimization program which includes automated exposure control, adjustment of the mA and/or kV according to patient size and/or use of iterative reconstruction technique. CONTRAST:  OMNIPAQUE  IOHEXOL  350 MG/ML SOLN COMPARISON:  The  only comparison CT is CT without contrast before the biopsy on 05/22/2024. The biopsy was done under ultrasound. FINDINGS: Lower chest: With noncontrast imaging, the cardiac blood pool has become less dense than the myocardium consistent with anemia.  The cardiac size is normal. There are scattered calcifications in the lower mitral ring. No pericardial effusion. Lung bases are clear of infiltrates. There is eventration and mild elevation anterior right diaphragm. Increased linear atelectasis right lower lobe posterior base. Hepatobiliary: Solitary 2 cm rim calcified stone in the proximal gallbladder. No wall thickening or bile duct dilatation. The liver is 22 cm in length and mildly steatotic. There is no mass enhancement. Pancreas: Partially atrophic. No mass common ductal dilatation or inflammatory change. Spleen: No mass.  No splenomegaly. Adrenals/Urinary Tract: There is no adrenal or renal mass enhancement. No urinary stone or obstruction. Diminished and delayed excretion is noted bilaterally and symmetrically. There is an irregular posteroinferior left perinephric hematoma, maximum diameters of 7.5 cm transverse, 4.6 cm AP, and 3.0 cm craniocaudal, with minimal nonlocalizing hemorrhagic content layering posteriorly along the pararenal fascia. There is a linear track of contrast extending from the left lower pole parenchyma posteriorly into the hematoma on series 7 axial images 45-47 consistent with an active bleed. In the bladder, there is trace air anteriorly which may be from catheterization. Foley catheter is in place. New heterogeneous material collects posteriorly in the bladder which is probably clotted blood and was not seen previously. There is mild thickening of the bladder versus underdistention. Correlate with urinalysis for possible cystitis. Some perivesical stranding does appear present along the bladder dome. Stomach/Bowel: No dilatation or wall thickening including the appendix. No mesenteric  inflammatory change. Remote Roux-en-Y gastric bypass. Surgically widened small bowel segment again noted anterior mid abdomen. Moderate-to-large amount of retained stool again noted ascending, transverse and descending colon, increased. There is sigmoid diverticulosis without diverticulitis. Vascular/Lymphatic: Versus in the abdominal wall are noted emptying in the common femoral veins. There is a prominent hepatic portal vein measuring 16 mm, stable. No portal vein thrombosis. Aortic atherosclerosis. No aneurysm or dissection. Shotty subcentimeter retroperitoneal lymph nodes are unchanged. No enlarged abdominopelvic nodes. Reproductive: Enlarged prostate 5.4 cm transverse axis. Other: There are small inguinal fat hernias. No incarcerated hernia. No hemorrhage is seen apart from the left perinephric space bleed. No free air. Minimal presacral ascites increased since August 10. Musculoskeletal: Mild degenerative change lumbar spine. Pelvic enthesopathy. Partial ankylosis of both SI joints. L4-5 right paracentral disc protrusion again noted with mass effect on the right L5 nerve root. IMPRESSION: 1. 7.5 x 4.6 x 3.0 cm left posteroinferior perinephric hematoma with a linear track of contrast extending from the left lower pole parenchyma posteriorly into the hematoma consistent with an active bleed. 2. Diminished and delayed renal excretion bilaterally and symmetrically, consistent with history of stage IV CKD. 3. New heterogeneous material in the bladder probably clotted blood. 4. Cystitis versus bladder nondistention. 5. Constipation and diverticulosis. 6. Cholelithiasis. 7. Aortic atherosclerosis. 8. Anemia, new since August 10. 9. L4-5 right paracentral disc protrusion with mass effect on the right L5 nerve root. 10. PRA is attempting to reach the ordering physician for stat notification at the time of signing. Aortic Atherosclerosis (ICD10-I70.0). Electronically Signed: By: Francis Quam M.D. On: 06/28/2024 23:32    VAS US  UPPER EXT VEIN MAPPING (PRE-OP  AVF) Result Date: 06/27/2024 UPPER EXTREMITY VEIN MAPPING Patient Name:  JAKHAI FANT  Date of Exam:   06/27/2024 Medical Rec #: [id number]      Accession #:    [id number] Date of Birth: 02/29/1971      Patient Gender: M Patient Age:   25 years Exam Location:  [facility] Procedure:      VAS US  UPPER  EXT VEIN MAPPING (PRE-OP  AVF) Referring Phys: KATHERYN SABA --------------------------------------------------------------------------------  Indications: Pre-access. History: ESRD. CKDV, CVA, HTN, DM2, HLD.  Comparison Study: No prior study. Performing Technologist: Edilia Elden Appl  Examination Guidelines: A complete evaluation includes B-mode imaging, spectral Doppler, color Doppler, and power Doppler as needed of all accessible portions of each vessel. Bilateral testing is considered an integral part of a complete examination. Limited examinations for reoccurring indications may be performed as noted. +-----------------+-------------+----------+-----------------------+ Right Cephalic   Diameter (cm)Depth (cm)       Findings         +-----------------+-------------+----------+-----------------------+ Shoulder             0.50        1.70                           +-----------------+-------------+----------+-----------------------+ Prox upper arm       0.52        0.87                           +-----------------+-------------+----------+-----------------------+ Mid upper arm        0.53        0.29                           +-----------------+-------------+----------+-----------------------+ Dist upper arm       0.42        0.52   branching and Thrombus. +-----------------+-------------+----------+-----------------------+ Antecubital fossa    0.49        0.40                           +-----------------+-------------+----------+-----------------------+ Prox forearm         0.35        0.49                            +-----------------+-------------+----------+-----------------------+ Mid forearm          0.35        0.25                           +-----------------+-------------+----------+-----------------------+ Dist forearm         0.40        0.16                           +-----------------+-------------+----------+-----------------------+ Wrist                0.29        0.18                           +-----------------+-------------+----------+-----------------------+ +-----------------+-------------+----------+---------+ Right Basilic    Diameter (cm)Depth (cm)Findings  +-----------------+-------------+----------+---------+ Prox upper arm       0.47                         +-----------------+-------------+----------+---------+ Mid upper arm        0.50                         +-----------------+-------------+----------+---------+ Dist upper arm       0.45  branching +-----------------+-------------+----------+---------+ Antecubital fossa    0.25                         +-----------------+-------------+----------+---------+ Prox forearm         0.30                         +-----------------+-------------+----------+---------+ Mid forearm          0.29                         +-----------------+-------------+----------+---------+ Distal forearm       0.23               branching +-----------------+-------------+----------+---------+ Elbow                0.33                         +-----------------+-------------+----------+---------+ Wrist                0.25                         +-----------------+-------------+----------+---------+ +-----------------+-------------+----------+---------+ Left Cephalic    Diameter (cm)Depth (cm)Findings  +-----------------+-------------+----------+---------+ Shoulder             0.50        1.36             +-----------------+-------------+----------+---------+ Prox upper arm       0.52         0.48             +-----------------+-------------+----------+---------+ Mid upper arm        0.54        0.27   branching +-----------------+-------------+----------+---------+ Dist upper arm       0.52        0.51   branching +-----------------+-------------+----------+---------+ Antecubital fossa    0.32        0.25             +-----------------+-------------+----------+---------+ Prox forearm         0.37        0.54   branching +-----------------+-------------+----------+---------+ Mid forearm          0.37        0.31             +-----------------+-------------+----------+---------+ Dist forearm         0.27        0.16             +-----------------+-------------+----------+---------+ Wrist                0.27        0.22             +-----------------+-------------+----------+---------+ +-----------------+-------------+----------+---------+ Left Basilic     Diameter (cm)Depth (cm)Findings  +-----------------+-------------+----------+---------+ Prox upper arm       0.55                         +-----------------+-------------+----------+---------+ Mid upper arm        0.57                         +-----------------+-------------+----------+---------+ Dist upper arm       0.40               branching +-----------------+-------------+----------+---------+  Antecubital fossa    0.34                         +-----------------+-------------+----------+---------+ Prox forearm         0.21                         +-----------------+-------------+----------+---------+ Mid forearm          0.17                         +-----------------+-------------+----------+---------+ Distal forearm       0.15                         +-----------------+-------------+----------+---------+ Elbow                0.31                         +-----------------+-------------+----------+---------+ Wrist                0.26                          +-----------------+-------------+----------+---------+ *See table(s) above for measurements and observations.  Diagnosing physician: Lonni Gaskins MD Electronically signed by Lonni Gaskins MD on 06/27/2024 at 1:34:10 PM.    Final      Medications: Infusions:  sodium chloride  irrigation      Scheduled Medications:  acetaminophen   650 mg Oral Q8H   amLODipine   10 mg Oral Daily   atorvastatin   40 mg Oral Daily   carvedilol   3.125 mg Oral BID   chlorhexidine        Chlorhexidine  Gluconate Cloth  6 each Topical Q0600   darbepoetin (ARANESP ) injection - DIALYSIS  60 mcg Subcutaneous Q Wed-1800   ezetimibe   10 mg Oral Daily   feeding supplement (NEPRO CARB STEADY)  237 mL Oral BID   ferrous sulfate   325 mg Oral QODAY   insulin  aspart  0-15 Units Subcutaneous TID WC   insulin  glargine  8 Units Subcutaneous Daily   lidocaine  (cardiac) 100 mg/5mL       lidocaine   2 patch Transdermal Q24H   lidocaine   1 Application Topical Once   multivitamin  1 tablet Oral QHS   pantoprazole   40 mg Oral Daily   polyethylene glycol  17 g Oral BID   QUEtiapine   50 mg Oral QHS   senna  2 tablet Oral Daily   tamsulosin   0.4 mg Oral Daily    have reviewed scheduled and prn medications.  Physical Exam:   General adult male in bed in no acute distress HEENT normocephalic atraumatic extraocular movements intact sclera anicteric Neck supple trachea midline Lungs clear to auscultation bilaterally normal work of breathing at rest on room air Heart S1S2 no rub Abdomen soft nontender obese habitus Extremities no edema  Psych normal mood and affect Neuro - alert and oriented x 3 provides hx and follows commands  GU - foley in place with hematuria  Access RIJ tunneled catheter      Katheryn [name] 06/29/2024,9:36 AM  LOS: 13 days     Addendum  - received a message from HD SW that patient is financially cleared.  Has an outpatient HD unit at [facility] TTS schedule   Katheryn [name],  MD 9:37 AM 06/29/2024

## 2024-06-29 NOTE — Progress Notes (Signed)
 IR consulted for possible left renal angiogram/embolization due to hematuria and recent CT noting concern for possible active bleeding.  Patient history and imaging reviewed by IR attending this morning who recommends holding on angiogram given current hemodynamic stability and low concern for ongoing bleeding from renal biopsy 7 days ago. Recommend continued CBI/hand irrigation per urology at this time, will make patient NPO for possible angiogram/embolization tomorrow if hematuria does not improve.  Attempted to discuss above with patient this morning however he is currently in the OR with vascular surgery. Will try again to see patient this afternoon.  Clotilda Hesselbach, PA-C

## 2024-06-29 NOTE — Progress Notes (Addendum)
 Daily Progress Note Intern Pager: [phone]  Patient name: Edward [name] Medical record number: [id number] Date of birth: 1970/11/17 Age: 53 y.o. Gender: male  Primary Care Provider: Donzetta Rollene BRAVO, MD Consultants: Nephrology, urology, IR, Vascular surgery Code Status: Full code  Pt Overview and Major Events to Date:  9/4 : Admitted for back pain  9/11 : Renal biopsy with IR  9/12: TDC placed by IR, HD initiated 9/16 : Failed void trail. Place foley   Assessment and Plan: 53 year old male with PMHx ankylosing spondylitis, CKDV, CVA, HTN, T2DM and, HLD, bipolar 1 and migraines presenting with back pain in the setting of flare of ankylosing spondylitis . Patient undergone Renal Biopsy on 06/22/24 w/ IR and initiated on HD per nephrology. His hospitalization c/b urinary retention required in and out cath then foley following by hematuria.He failed void trial on 9/16 and place foley back. Renal US  on 9/11 shows perirenal hemorrhage s/p renal biopsy and bladder hematoma. In addition, his kidney fx has also become worsening now on iHD.  Assessment & Plan Ankylosing spondylitis flare Well-controlled on PO pain regimen. - Continue prednisone  taper - Pain regimen: - Acetaminophen  650 mg every 8 hours scheduled - Oxycodone  5-10 mg every 4 hours as needed for moderate/severe pain - Lidocaine  patches - Tizanidine  4 mg every 8 hours as needed - Need outpatient f/u with Rheumatology  - PT/OT follow  ESRD (end stage renal disease) on dialysis (HCC) On PRN iHD per Nephrology due to no sign of renal recovery.  TDC placed by IR 9/12.  - Nephrology consultation, appreciate recommendations Consult vascular surgery re: AVF - Appreciate vascular surgery NPO after MN for Fistula VS Graft w/ vascular today iHD on TThS He has been accepted for outpatient HD TTS at [facility]   In the longer-term, he is interested in home therapy and kidney transplant evaluation. - NPO - pre-procedure - Strict I's  and O's - Electrolyte management as indicated Perinephric hematoma Gross hematuria Urinary retention Dark red urine. CT Hematuria work up on 06/28/24 shows 7.5 x 4.6 x 3.0 cm left posteroinferior perinephric hematoma with a linear track of contrast extending from the left lower pole parenchyma posteriorly into the hematoma consistent with an active bleed. Stable H/H  w/o blood transfusion. Not a candidate for TXA given recent CVA.  If worsening, consider IR embolization. Fail void trial on 9/16. Place new foley w/ Caude 40F w/ 1050 ml dark red bloody urine output then replaced foley overnight for 3 way foley for CBI - Urology consult, appreciate recommendations - IR consulted, appreciate recommendations - CBC in PM - May go to OR with IR for embolization pending progress  - Continue foley w/ CBI per Urology - Strict I&O - Monitor CBC PM and UOP - Continue Flomax  0.4 mg Daily H/O: CVA (cerebrovascular accident) CVA 4 weeks ago, on ASA prior to admission. ASA then was stopped for ASA washout pre-kidney biopsy (9/5 - 9/10) and continue to hold due  to hematoma/hematuria  - Neurology weighed in on 9/13- hold aspirin  until bleeding resolved  - Continue to hold ASA and SQH d/t remaining w/ blooding urine after replaced foley - Continue statin, ezetimibe   Chronic health problem HTN: Continue home amLODipine  10 mg daily, Carvedilol  3.125 mg BID DMII: sSSI, Glargine 8 units started 9/14. Will plan on keep his insulin  as is for now as he will completed his Prednisolone treatment today HLD: Continue Ezetimibe  10 mg daily Bipolar 1 disorder: Increase QUEtiapine  25 mg to  50 mg at bedtime since buspirone  is now stopped. Will repeat EKG tmrw morning Depression: Stopped Buspar  7.5 mg daily  Constipation: Continue MiraLAX  twice daily, senna daily : Stool burden on CT   FEN/GI: NPO - pre procedure PPx: SCDs, holding anticoagulation in the setting of hematuria Dispo: Home pending hematuria improvement HD  chair placement  Subjective:  Doing ok this morning. Denies dizziness or lightheaded. Pt states foley was replaced Ovn w/o difficulty. He feels sorry that he didn't take good care of himself which may contribute to what is going on. States would like to significantly change lifestyle after d/c from the hospital.   Objective: Temp:  [97.4 F (36.3 C)-99.1 F (37.3 C)] 97.5 F (36.4 C) (09/17 0800) Pulse Rate:  [58-80] 58 (09/17 0800) Resp:  [12-18] 17 (09/17 0800) BP: (109-141)/(68-85) 120/75 (09/17 0800) SpO2:  [94 %-99 %] 96 % (09/17 0800) Weight:  [116.4 kg-118.8 kg] 116.4 kg (09/16 1803)  Physical Exam: General: Sitting up in bed, NAD Cardiovascular: RRR without murmur Respiratory: CTAB.  Normal work of breathing on room air Abdomen: Soft, nontender, nondistended.  Foley catheter in place draining blood-tinged urine, Last BM 06/25/24 Extremities: No peripheral edema Vas cath : dressing w/ saturated blood GU : foley w/ dark reddish urine output  Laboratory: Most recent CBC Lab Results  Component Value Date   WBC 14.1 (H) 06/29/2024   HGB 8.0 (L) 06/29/2024   HCT 25.2 (L) 06/29/2024   MCV 90.0 06/29/2024   PLT 287 06/29/2024   Most recent BMP    Latest Ref Rng & Units 06/29/2024    3:53 AM  BMP  Glucose 70 - 99 mg/dL 852   BUN 6 - 20 mg/dL 30   Creatinine 9.38 - 1.24 mg/dL 6.76   Sodium 864 - 854 mmol/L 135   Potassium 3.5 - 5.1 mmol/L 3.7   Chloride 98 - 111 mmol/L 93   CO2 22 - 32 mmol/L 26   Calcium  8.9 - 10.3 mg/dL 7.8     Other pertinent labs: Hepatitis B surface antigen: Nonreactive  Imaging/Diagnostic Tests: IR TUNNELED CENTRAL VENOUS CATH [facility] W IMG Result Date: 06/24/2024 IMPRESSION: Successful placement of a right jugular tunneled dialysis catheter using ultrasound and fluoroscopic guidance. Electronically Signed   By: Juliene Balder M.D.   On: 06/24/2024 17:31     Suzen Houston NOVAK, DO 06/29/2024, 9:41 AM  PGY-1, [facility] FPTS  Intern pager: [phone], text pages welcome Secure chat group [facility] Teaching Service

## 2024-06-29 NOTE — Progress Notes (Signed)
 Spoke with Dr. Hughes with IR.  He will remain NPO in plan for IR embolization tomorrow if gross hematuria does not subside.  Urology aware.  Plan for CBI again today however he has been on CBI for 1 week now with persistent bleeding. If clinically declines please contact Urology/IR urgently. Urology to see patient this evening.

## 2024-06-30 ENCOUNTER — Inpatient Hospital Stay (HOSPITAL_COMMUNITY): Payer: PRIVATE HEALTH INSURANCE

## 2024-06-30 ENCOUNTER — Encounter (HOSPITAL_COMMUNITY): Payer: Self-pay | Admitting: Vascular Surgery

## 2024-06-30 DIAGNOSIS — R339 Retention of urine, unspecified: Secondary | ICD-10-CM | POA: Diagnosis not present

## 2024-06-30 DIAGNOSIS — R310 Gross hematuria: Secondary | ICD-10-CM | POA: Diagnosis not present

## 2024-06-30 DIAGNOSIS — Z992 Dependence on renal dialysis: Secondary | ICD-10-CM | POA: Diagnosis not present

## 2024-06-30 DIAGNOSIS — N186 End stage renal disease: Secondary | ICD-10-CM | POA: Diagnosis not present

## 2024-06-30 HISTORY — PX: IR ANGIOGRAM VISCERAL SELECTIVE: IMG657

## 2024-06-30 HISTORY — PX: IR ANGIOGRAM SELECTIVE EACH ADDITIONAL VESSEL: IMG667

## 2024-06-30 HISTORY — PX: IR US GUIDE VASC ACCESS RIGHT: IMG2390

## 2024-06-30 HISTORY — PX: IR EMBO ART  VEN HEMORR LYMPH EXTRAV  INC GUIDE ROADMAPPING: IMG5450

## 2024-06-30 LAB — GLUCOSE, CAPILLARY
Glucose-Capillary: 146 mg/dL — ABNORMAL HIGH (ref 70–99)
Glucose-Capillary: 139 mg/dL — ABNORMAL HIGH (ref 70–99)

## 2024-06-30 LAB — CBC
HCT: 27.2 % — ABNORMAL LOW (ref 39.0–52.0)
Hemoglobin: 8.7 g/dL — ABNORMAL LOW (ref 13.0–17.0)
MCH: 28.9 pg (ref 26.0–34.0)
MCHC: 32 g/dL (ref 30.0–36.0)
MCV: 90.4 fL (ref 80.0–100.0)
Platelets: 298 K/uL (ref 150–400)
RBC: 3.01 MIL/uL — ABNORMAL LOW (ref 4.22–5.81)
RDW: 15.3 % (ref 11.5–15.5)
WBC: 14.4 K/uL — ABNORMAL HIGH (ref 4.0–10.5)
nRBC: 0 % (ref 0.0–0.2)

## 2024-06-30 LAB — RENAL FUNCTION PANEL
Albumin: 2.2 g/dL — ABNORMAL LOW (ref 3.5–5.0)
Anion gap: 12 (ref 5–15)
BUN: 42 mg/dL — ABNORMAL HIGH (ref 6–20)
CO2: 27 mmol/L (ref 22–32)
Calcium: 8.3 mg/dL — ABNORMAL LOW (ref 8.9–10.3)
Chloride: 96 mmol/L — ABNORMAL LOW (ref 98–111)
Creatinine, Ser: 4.32 mg/dL — ABNORMAL HIGH (ref 0.61–1.24)
GFR, Estimated: 16 mL/min — ABNORMAL LOW (ref >60)
Glucose, Bld: 210 mg/dL — ABNORMAL HIGH (ref 70–99)
Phosphorus: 6.2 mg/dL — ABNORMAL HIGH (ref 2.5–4.6)
Potassium: 4.9 mmol/L (ref 3.5–5.1)
Sodium: 135 mmol/L (ref 135–145)

## 2024-06-30 LAB — PROTIME-INR
INR: 1.1 (ref 0.8–1.2)
Prothrombin Time: 14.7 s (ref 11.4–15.2)

## 2024-06-30 LAB — VITAMIN B6: Vitamin B6: 5 ug/L (ref 3.4–65.2)

## 2024-06-30 MED ORDER — MIDAZOLAM HCL 2 MG/2ML IJ SOLN
INTRAMUSCULAR | Status: AC | PRN
  Administered 2024-06-30: 1 mg via INTRAVENOUS
  Administered 2024-06-30 (×2): .5 mg via INTRAVENOUS
  Administered 2024-06-30: 1.5 mg via INTRAVENOUS
  Administered 2024-06-30: .5 mg via INTRAVENOUS

## 2024-06-30 MED ORDER — FENTANYL CITRATE (PF) 100 MCG/2ML IJ SOLN
INTRAMUSCULAR | Status: AC | PRN
  Administered 2024-06-30 (×2): 25 ug via INTRAVENOUS
  Administered 2024-06-30 (×3): 50 ug via INTRAVENOUS

## 2024-06-30 MED ORDER — HEPARIN SODIUM (PORCINE) 1000 UNIT/ML IJ SOLN
INTRAMUSCULAR | Status: AC
  Filled 2024-06-30: qty 4

## 2024-06-30 MED ORDER — PENTAFLUOROPROP-TETRAFLUOROETH EX AERO: 1.0000 | INHALATION_SPRAY | CUTANEOUS | Status: DC | PRN

## 2024-06-30 MED ORDER — LIDOCAINE-EPINEPHRINE 1 %-1:100000 IJ SOLN
INTRAMUSCULAR | Status: AC
  Filled 2024-06-30: qty 1

## 2024-06-30 MED ORDER — MIDAZOLAM HCL 2 MG/2ML IJ SOLN
INTRAMUSCULAR | Status: AC
  Filled 2024-06-30: qty 4

## 2024-06-30 MED ORDER — BISACODYL 10 MG RE SUPP
10.0000 mg | Freq: Once | RECTAL | Status: DC
  Filled 2024-06-30: qty 1

## 2024-06-30 MED ORDER — FENTANYL CITRATE (PF) 100 MCG/2ML IJ SOLN
INTRAMUSCULAR | Status: AC
  Filled 2024-06-30: qty 4

## 2024-06-30 MED ORDER — LIDOCAINE-PRILOCAINE 2.5-2.5 % EX CREA: 1.0000 | TOPICAL_CREAM | CUTANEOUS | Status: DC | PRN

## 2024-06-30 MED ORDER — IOHEXOL 300 MG/ML  SOLN
100.0000 mL | Freq: Once | INTRAMUSCULAR | Status: AC | PRN
  Administered 2024-06-30: 25 mL via INTRA_ARTERIAL

## 2024-06-30 MED ORDER — HYDROMORPHONE HCL 1 MG/ML IJ SOLN
INTRAMUSCULAR | Status: AC
  Filled 2024-06-30: qty 1

## 2024-06-30 MED ORDER — IOHEXOL 300 MG/ML  SOLN
150.0000 mL | Freq: Once | INTRAMUSCULAR | Status: AC | PRN
  Administered 2024-06-30: 50 mL via INTRA_ARTERIAL

## 2024-06-30 MED ORDER — HEPARIN SODIUM (PORCINE) 1000 UNIT/ML DIALYSIS
1000.0000 [IU] | INTRAMUSCULAR | Status: DC | PRN
  Administered 2024-06-30: 1000 [IU]

## 2024-06-30 MED ORDER — ANTICOAGULANT SODIUM CITRATE 4% (200MG/5ML) IV SOLN: 5.0000 mL | Status: DC | PRN

## 2024-06-30 MED ORDER — HYDROMORPHONE HCL 1 MG/ML IJ SOLN
INTRAMUSCULAR | Status: AC | PRN
  Administered 2024-06-30 (×2): .5 mg via INTRAVENOUS

## 2024-06-30 MED ORDER — LIDOCAINE HCL (PF) 1 % IJ SOLN: 5.0000 mL | INTRAMUSCULAR | Status: DC | PRN

## 2024-06-30 MED ORDER — DARBEPOETIN ALFA 60 MCG/0.3ML IJ SOSY
60.0000 ug | PREFILLED_SYRINGE | INTRAMUSCULAR | Status: DC
  Administered 2024-06-30: 60 ug via SUBCUTANEOUS
  Filled 2024-06-30: qty 0.3

## 2024-06-30 MED ORDER — ALTEPLASE 2 MG IJ SOLR: 2.0000 mg | Freq: Once | INTRAMUSCULAR | Status: DC | PRN

## 2024-06-30 NOTE — Anesthesia Postprocedure Evaluation (Signed)
 Anesthesia Post Note  Patient: Edward [name]  Procedure(s) Performed: LEFT BRACHIOCEPHALIC ARTERIOVENOUS (AV) FISTULA CREATION (Left: Arm Upper)     Patient location during evaluation: PACU Anesthesia Type: General Level of consciousness: awake and alert Pain management: pain level controlled Vital Signs Assessment: post-procedure vital signs reviewed and stable Respiratory status: spontaneous breathing, nonlabored ventilation, respiratory function stable and patient connected to nasal cannula oxygen Cardiovascular status: blood pressure returned to baseline and stable Postop Assessment: no apparent nausea or vomiting Anesthetic complications: no   No notable events documented.                  Caeley Dohrmann

## 2024-06-30 NOTE — Progress Notes (Addendum)
  Progress Note    06/30/2024 9:05 AM 1 Day Post-Op  Subjective: No complaints    Vitals:   06/30/24 0258 06/30/24 0823  BP: 126/72 136/74  Pulse: (!) 58 (!) 57  Resp: 18 14  Temp: 98 F (36.7 C) 98 F (36.7 C)  SpO2: 97% 100%    Physical Exam: General: Laying in bed, NAD Cardiac: Regular Lungs: Nonlabored Incisions: Left arm incision c/d/l without hematoma Extremities: Great thrill in left brachiocephalic AVF.  Palpable left radial pulse.  Intact motor and sensation of left hand   CBC    Component Value Date/Time   WBC 14.4 (H) 06/30/2024 0440   RBC 3.01 (L) 06/30/2024 0440   HGB 8.7 (L) 06/30/2024 0440   HGB 11.3 (L) 03/21/2024 1037   HCT 27.2 (L) 06/30/2024 0440   HCT 36.2 (L) 03/21/2024 1037   PLT 298 06/30/2024 0440   PLT 338 03/21/2024 1037   MCV 90.4 06/30/2024 0440   MCV 93 03/21/2024 1037   MCH 28.9 06/30/2024 0440   MCHC 32.0 06/30/2024 0440   RDW 15.3 06/30/2024 0440   RDW 14.2 03/21/2024 1037   LYMPHSABS 1.2 06/28/2024 1445   MONOABS 1.1 (H) 06/28/2024 1445   EOSABS 0.3 06/28/2024 1445   BASOSABS 0.0 06/28/2024 1445    BMET    Component Value Date/Time   NA 135 06/30/2024 0440   NA 141 05/06/2024 1552   K 4.9 06/30/2024 0440   CL 96 (L) 06/30/2024 0440   CO2 27 06/30/2024 0440   GLUCOSE 210 (H) 06/30/2024 0440   BUN 42 (H) 06/30/2024 0440   BUN 52 (H) 05/06/2024 1552   CREATININE 4.32 (H) 06/30/2024 0440   CREATININE 1.47 (H) 11/07/2021 0903   CALCIUM  8.3 (L) 06/30/2024 0440   GFRNONAA 16 (L) 06/30/2024 0440   GFRNONAA >89 05/02/2016 0925   GFRAA 108 12/15/2019 1030   GFRAA >89 05/02/2016 0925    INR    Component Value Date/Time   INR 1.1 06/30/2024 0440     Intake/Output Summary (Last 24 hours) at 06/30/2024 0905 Last data filed at 06/30/2024 0645 Gross per 24 hour  Intake 4350 ml  Output 88259 ml  Net -7390 ml      Assessment/Plan:  53 y.o. male is 1 day postop, s/p: Creation of left brachiocephalic AV  fistula  -He is doing well this morning without any complaints.  He denies any pain at his incision site -He denies any symptoms of steal in the left hand such as weakness, numbness, pain, or coldness.  He has a palpable left radial pulse and intact motor and sensation of the left hand -His left arm incision is intact and dry without bleeding or hematoma -His left brachiocephalic fistula has a great thrill in the upper arm -He will continue with dialysis through his right IJ TDC.  We will arrange follow-up with our office in 4 to 6 weeks for left arm fistula duplex.   Ahmed Holster, PA-C [facility] [phone] 06/30/2024 9:05 AM   I have seen and evaluated the patient. I agree with the PA note as documented above.  Postop day 1 status post left brachiocephalic AV fistula.  Excellent thrill.  Palpable radial pulse.  No signs of steal.  Will arrange follow-up in 4 to 6 weeks with duplex and needs 3 months to mature.  Call vascular with questions or concerns.  Lonni DOROTHA Gaskins, MD [facility] [place] Office: [phone]

## 2024-06-30 NOTE — Assessment & Plan Note (Signed)
 Well-controlled on PO pain regimen. - Continue prednisone  taper - Pain regimen: - Acetaminophen  650 mg every 8 hours scheduled - Oxycodone  5-10 mg every 4 hours as needed for moderate/severe pain - Lidocaine  patches - Tizanidine  4 mg every 8 hours as needed - Need outpatient f/u with Rheumatology  - PT/OT follow  HTN: Continue home amLODipine  10 mg daily, Carvedilol  3.125 mg BID DMII: sSSI, Glargine 8 units started 9/14. Will plan on keep his insulin  as is for now as he will completed his Prednisolone treatment today HLD: Continue Ezetimibe  10 mg daily Bipolar 1 disorder: Increase QUEtiapine  25 mg to 50 mg at bedtime since buspirone  is now stopped. Will repeat EKG tmrw morning Depression: Stopped Buspar  7.5 mg daily  Constipation: Continue MiraLAX  twice daily, senna daily : Stool burden on CT

## 2024-06-30 NOTE — Procedures (Signed)
 Interventional Radiology Procedure Note  Procedure: Left renal artery angio and selective renal artery embolization  Complications: None  Estimated Blood Loss: < 10 mL  Findings: Lower pole arterial hemorrhage seen and selectively embolized.  Post embolization hemostasis.  Cordella DELENA Banner, MD

## 2024-06-30 NOTE — Plan of Care (Signed)

## 2024-06-30 NOTE — Progress Notes (Signed)
 Edward [name]  Assessment/ Plan: Pt is a 53 y.o. yo male    # New ESRD - Patient with CKD5 progressed to new ESRD due to biopsy proven advanced diabetic glomerulosclerosis class IV, severe arterionephrosclerosis, secondary FSGS with severe interstitial fibrosis and tubular atrophy.  Rapidly progressive CKD.  UPCR almost 14 g. OP labs including ANCA, ASO, complement level, anti dsDNA ab, Hep B, Hep C, anti-GBM, anti-PLA2R ab negative. He is s/p kidney biopsy on 9/10 with DM and significant scarring as above.  S/P RIJ TDC followed by first HD on 9/12.  He had a left brachiocephalic AVF placed on 9/17 with vascular. --------------------- - HD per TTS schedule  - He has an outpatient HD unit at [facility] on a TTS schedule with financial clearance confirmed  - In the longer-term, he is interested in home therapy and kidney transplant evaluation.  [name] history of gastric bypass - would need to discuss with surgery and review CT if he were to be interested in PD - Appreciate vascular surgery - he will need to follow-up with them in clinic re: his maturing AVF  # Bleeding from HD catheter site requiring prolonged pressure by the bedside nurse over the weekend.  IR evaluated.  S/p DDAVP  and resolved  # Gross hematuria after kidney biopsy:  - Reviewed kidney ultrasound results and CT results.   - Urology is following    - Foley catheter in place and patient with CBI - Urology is following and they consulted IR for embolization for perinephric hematoma; IR has held off for now  # Anemia of CKD and acute blood loss anemia due to hematuria. - Iron sat 51 %, received Aranesp  on 9/10, worsening anemia due to hematuria/acute blood loss.   - Scheduled aranesp  60 mcg every week - was missed on 9/17 and I have retimed for every Thursday starting today  - Transfuse PRBC as needed per primary team    #Ankylosing spondylitis - With flare - Per primary, on  prednisone  with dose taper per team  - noted GI ppx   #Hyperkalemia - Renal diet - s/p Lokelma  and now on dialysis.  # Metabolic acidosis - off of sodium bicarbonate , now managed with HD.   #Hypertension - BP currently acceptable on current regimen - avoid hypotension  Disposition - continue inpatient monitoring    Katheryn [name], MD 7:17 AM 06/30/2024     Subjective:  Last HD on 9/16 with 1 kg UF.  He has continued on CBI so UOP is not accurate.  He had a left brachiocephalic AVF placed on 9/17 with vascular.  He feels ok.  He was moved to 3 west.  We discussed no lab draws or blood draws to left arm and lifting restrictions.    Review of systems:    Denies shortness of breath or chest pain  Denies n/v  He has been NPO in event that he needs a procedure with IR  Urine output is clearing   Objective Vital signs in last 24 hours: Vitals:   06/29/24 1834 06/29/24 2019 06/29/24 2320 06/30/24 0258  BP: (!) 140/83 125/64 117/62 126/72  Pulse: 75 89 65 (!) 58  Resp: 15 18 18 18   Temp: 98 F (36.7 C) 99.1 F (37.3 C) 97.7 F (36.5 C) 98 F (36.7 C)  TempSrc: Oral Oral Axillary   SpO2: 97% 99% 93% 97%  Weight:      Height:       Weight  change:   Intake/Output Summary (Last 24 hours) at 06/30/2024 0717 Last data filed at 06/30/2024 0645 Gross per 24 hour  Intake 4350 ml  Output 85209 ml  Net -10440 ml     Labs: RENAL PANEL Recent Labs  Lab 06/27/24 0556 06/28/24 0351 06/28/24 1505 06/29/24 0353 06/30/24 0440  NA 138 138 136 135 135  K 4.4 4.0 4.6 3.7 4.9  CL 101 100 99 93* 96*  CO2 25 27 27 26 27   GLUCOSE 177* 110* 208* 147* 210*  BUN 80* 48* 52* 30* 42*  CREATININE 5.06* 3.99* 4.44* 3.23* 4.32*  CALCIUM  7.7* 8.0* 8.1* 7.8* 8.3*  PHOS 4.3 3.9 4.0 3.4 6.2*  ALBUMIN 2.2* 2.1* 2.1* 2.0* 2.2*    Liver Function Tests: Recent Labs  Lab 06/28/24 1505 06/29/24 0353 06/30/24 0440  ALBUMIN 2.1* 2.0* 2.2*   No results for input(s): LIPASE, AMYLASE  in the last 168 hours. No results for input(s): AMMONIA in the last 168 hours. CBC: Recent Labs    03/21/24 1037 05/09/24 1159 06/19/24 0437 06/22/24 0510 06/27/24 2052 06/28/24 0351 06/28/24 1445 06/28/24 1932 06/29/24 0353 06/29/24 1855 06/30/24 0440  HGB 11.3*   < >  --    < >  --    < > 8.4* 9.2* 8.0* 9.5* 8.7*  MCV 93   < >  --    < >  --    < > 90.8  --  90.0  --  90.4  VITAMINB12  --   --   --   --  518  --   --   --   --   --   --   FOLATE  --   --   --   --  12.8  --   --   --   --   --   --   FERRITIN 60  --  120  --   --   --   --   --   --   --   --   TIBC 298  --  209*  --   --   --   --   --   --   --   --   IRON 52  --  107  --   --   --   --   --   --   --   --    < > = values in this interval not displayed.    Cardiac Enzymes: No results for input(s): CKTOTAL, CKMB, CKMBINDEX, TROPONINI in the last 168 hours.  CBG: Recent Labs  Lab 06/28/24 1826 06/28/24 2140 06/29/24 0558 06/29/24 0911 06/29/24 1313  GLUCAP 125* 194* 116* 112* 136*    Iron Studies:  No results for input(s): IRON, TIBC, TRANSFERRIN, FERRITIN in the last 72 hours.  Studies/Results: CT HEMATURIA WORKUP Addendum Date: 06/28/2024 ADDENDUM REPORT: 06/28/2024 23:50 ADDENDUM: Critical Value/emergent results were called by telephone at the time of interpretation on 06/28/2024 at 11:36 pm to provider [facility] [place] , who verbally acknowledged these results. Electronically Signed   By: Francis Quam M.D.   On: 06/28/2024 23:50   Result Date: 06/28/2024 CLINICAL DATA:  The patient had a left renal biopsy on August 10 for history of chronic kidney disease stage 4, and has been having hematuria and weakness since then. EXAM: CT ABDOMEN AND PELVIS WITHOUT AND WITH CONTRAST TECHNIQUE: Multidetector CT imaging of the abdomen and pelvis was performed following the standard protocol before and following the  bolus administration of intravenous contrast. RADIATION DOSE REDUCTION: This  exam was performed according to the departmental dose-optimization program which includes automated exposure control, adjustment of the mA and/or kV according to patient size and/or use of iterative reconstruction technique. CONTRAST:  OMNIPAQUE  IOHEXOL  350 MG/ML SOLN COMPARISON:  The only comparison CT is CT without contrast before the biopsy on 05/22/2024. The biopsy was done under ultrasound. FINDINGS: Lower chest: With noncontrast imaging, the cardiac blood pool has become less dense than the myocardium consistent with anemia. The cardiac size is normal. There are scattered calcifications in the lower mitral ring. No pericardial effusion. Lung bases are clear of infiltrates. There is eventration and mild elevation anterior right diaphragm. Increased linear atelectasis right lower lobe posterior base. Hepatobiliary: Solitary 2 cm rim calcified stone in the proximal gallbladder. No wall thickening or bile duct dilatation. The liver is 22 cm in length and mildly steatotic. There is no mass enhancement. Pancreas: Partially atrophic. No mass common ductal dilatation or inflammatory change. Spleen: No mass.  No splenomegaly. Adrenals/Urinary Tract: There is no adrenal or renal mass enhancement. No urinary stone or obstruction. Diminished and delayed excretion is noted bilaterally and symmetrically. There is an irregular posteroinferior left perinephric hematoma, maximum diameters of 7.5 cm transverse, 4.6 cm AP, and 3.0 cm craniocaudal, with minimal nonlocalizing hemorrhagic content layering posteriorly along the pararenal fascia. There is a linear track of contrast extending from the left lower pole parenchyma posteriorly into the hematoma on series 7 axial images 45-47 consistent with an active bleed. In the bladder, there is trace air anteriorly which may be from catheterization. Foley catheter is in place. New heterogeneous material collects posteriorly in the bladder which is probably clotted blood and was  not seen previously. There is mild thickening of the bladder versus underdistention. Correlate with urinalysis for possible cystitis. Some perivesical stranding does appear present along the bladder dome. Stomach/Bowel: No dilatation or wall thickening including the appendix. No mesenteric inflammatory change. Remote Roux-en-Y gastric bypass. Surgically widened small bowel segment again noted anterior mid abdomen. Moderate-to-large amount of retained stool again noted ascending, transverse and descending colon, increased. There is sigmoid diverticulosis without diverticulitis. Vascular/Lymphatic: Versus in the abdominal wall are noted emptying in the common femoral veins. There is a prominent hepatic portal vein measuring 16 mm, stable. No portal vein thrombosis. Aortic atherosclerosis. No aneurysm or dissection. Shotty subcentimeter retroperitoneal lymph nodes are unchanged. No enlarged abdominopelvic nodes. Reproductive: Enlarged prostate 5.4 cm transverse axis. Other: There are small inguinal fat hernias. No incarcerated hernia. No hemorrhage is seen apart from the left perinephric space bleed. No free air. Minimal presacral ascites increased since August 10. Musculoskeletal: Mild degenerative change lumbar spine. Pelvic enthesopathy. Partial ankylosis of both SI joints. L4-5 right paracentral disc protrusion again noted with mass effect on the right L5 nerve root. IMPRESSION: 1. 7.5 x 4.6 x 3.0 cm left posteroinferior perinephric hematoma with a linear track of contrast extending from the left lower pole parenchyma posteriorly into the hematoma consistent with an active bleed. 2. Diminished and delayed renal excretion bilaterally and symmetrically, consistent with history of stage IV CKD. 3. New heterogeneous material in the bladder probably clotted blood. 4. Cystitis versus bladder nondistention. 5. Constipation and diverticulosis. 6. Cholelithiasis. 7. Aortic atherosclerosis. 8. Anemia, new since August 10. 9.  L4-5 right paracentral disc protrusion with mass effect on the right L5 nerve root. 10. PRA is attempting to reach the ordering physician for stat notification at the time of signing. Aortic  Atherosclerosis (ICD10-I70.0). Electronically Signed: By: Francis Quam M.D. On: 06/28/2024 23:32     Medications: Infusions:  sodium chloride  irrigation      Scheduled Medications:  acetaminophen   650 mg Oral Q8H   amLODipine   10 mg Oral Daily   atorvastatin   40 mg Oral Daily   carvedilol   3.125 mg Oral BID   Chlorhexidine  Gluconate Cloth  6 each Topical Q0600   darbepoetin (ARANESP ) injection - DIALYSIS  60 mcg Subcutaneous Q Wed-1800   ezetimibe   10 mg Oral Daily   feeding supplement (NEPRO CARB STEADY)  237 mL Oral BID   ferrous sulfate   325 mg Oral QODAY   insulin  aspart  0-15 Units Subcutaneous TID WC   insulin  glargine  5 Units Subcutaneous Daily   lidocaine   2 patch Transdermal Q24H   lidocaine   1 Application Topical Once   multivitamin  1 tablet Oral QHS   pantoprazole   40 mg Oral Daily   polyethylene glycol  17 g Oral BID   QUEtiapine   50 mg Oral QHS   senna  2 tablet Oral BID   tamsulosin   0.4 mg Oral Daily    have reviewed scheduled and prn medications.  Physical Exam:     General adult male in bed in no acute distress HEENT normocephalic atraumatic extraocular movements intact sclera anicteric Neck supple trachea midline Lungs clear to auscultation bilaterally normal work of breathing at rest on room air Heart S1S2 no rub Abdomen soft nontender obese habitus Extremities no edema  Psych normal mood and affect Neuro - alert and oriented x 3 provides hx and follows commands  GU - foley in place with yellow urine - clearing  Access RIJ tunneled catheter; LUE AVF with bruit and thrill      Katheryn [name] 06/30/2024,7:37 AM  LOS: 14 days

## 2024-06-30 NOTE — Assessment & Plan Note (Signed)
 Stable H/H. CT Hematuria work up on 06/28/24 shows 7.5 x 4.6 x 3.0 cm left posteroinferior perinephric hematoma with a linear track of contrast extending from the left lower pole parenchyma posteriorly into the hematoma consistent with an active bleed. Stable H/H  w/o blood transfusion. Not a candidate for TXA given recent CVA.  If worsening, consider IR embolization. Fail void trial on 9/16 then replaced foley overnight for 3 way foley for CBI. CBI pause this morning - Urology consult, appreciate recommendations - NPO for possible IR procedure - IR consulted for possible Angio w/ embolization  - Continue foley pause CBI until noon per Urology - Strict I&O - Monitor UOP - Continue Flomax  0.4 mg Daily

## 2024-06-30 NOTE — Progress Notes (Signed)
 1 Day Post-Op Subjective: Patient resting comfortably in bed.  Reviewed case and plan.  He appears to be in better spirits today and is more alert.  No acute events overnight.  Objective: Vital signs in last 24 hours: Temp:  [97.5 F (36.4 C)-99.1 F (37.3 C)] 97.5 F (36.4 C) (09/18 1146) Pulse Rate:  [57-89] 84 (09/18 1146) Resp:  [8-26] 14 (09/18 1146) BP: (117-159)/(62-88) 142/65 (09/18 1146) SpO2:  [92 %-100 %] 96 % (09/18 1146)  Assessment/Plan: # Gross hematuria following renal biopsy # Blood products of bladder  CBI overnight.  Medium drip rate with clear irrigant on rounds this morning.  Difficulty hand irrigating.  No clot material was returned.  Feels as though there is consolidated clot material and bladder.  Clamped CBI at that time.  Patient was later taken to interventional radiology for selective embolization.  Returned with dark red irrigant.  Hopefully this is postprocedural and just needs to be rinsed out.  CBI was restarted by slow gtt. and will continue while patient is receiving dialysis.  If clear on his return nursing will clamp again.  N.p.o. at midnight.  Bladder scan ordered to assess for clot burden.  Consider cystoscopy and clot evacuation in the a.m.  If there is no clot burden and urine is cleared tomorrow morning, we will proceed with voiding trial.  Trend H&H.  Intake/Output from previous day: 09/17 0701 - 09/18 0700 In: 4350 [I.V.:350] Out: 85209 [Urine:14780; Blood:10]  Intake/Output this shift: No intake/output data recorded.  Physical Exam:  General: Alert and oriented CV: No cyanosis Lungs: equal chest rise Abdomen: Soft, NTND, no rebound or guarding Gu: Three-way 30f in place draining dark red irrigant  Lab Results: Recent Labs    06/29/24 0353 06/29/24 1855 06/30/24 0440  HGB 8.0* 9.5* 8.7*  HCT 25.2* 30.2* 27.2*   BMET Recent Labs    06/29/24 0353 06/29/24 1855 06/30/24 0440  NA 135  --  135  K 3.7  --  4.9  CL 93*   --  96*  CO2 26  --  27  GLUCOSE 147*  --  210*  BUN 30*  --  42*  CREATININE 3.23*  --  4.32*  CALCIUM  7.8*  --  8.3*  HGB 8.0* 9.5* 8.7*  WBC 14.1*  --  14.4*     Studies/Results: IR Angiogram Visceral Selective Result Date: 06/30/2024 INDICATION: Patient is status post left renal biopsy with an acute hemorrhage in the lower pole of the left kidney. Planned evaluation and potential embolization of a lower pole renal artery. EXAM: Angiogram MEDICATIONS: None. The antibiotic was administered within 1 hour of the procedure ANESTHESIA/SEDATION: Moderate (conscious) sedation was employed during this procedure. A total of Versed  4 mg and Fentanyl  200 mcg was administered intravenously by the radiology nurse. 1 mg Dilaudid  also administered during the procedure. Total intra-service moderate Sedation Time: 54 minutes. The patient's level of consciousness and vital signs were monitored continuously by radiology nursing throughout the procedure under my direct supervision. CONTRAST:  75 mL Omnipaque  300 FLUOROSCOPY: Radiation Exposure Index (as provided by the fluoroscopic device): 628 mGy Kerma COMPLICATIONS: None immediate. PROCEDURE: Informed consent was obtained from the patient following explanation of the procedure, risks, benefits and alternatives. The patient understands, agrees and consents for the procedure. All questions were addressed. A time out was performed prior to the initiation of the procedure. Maximal barrier sterile technique utilized including caps, mask, sterile gowns, sterile gloves, large sterile drape, hand hygiene, and Betadine  prep. The patient was placed in a supine position on the IR table. The right groin was prepped and draped in usual sterile fashion. Local anesthesia was achieved with 1% lidocaine . A small incision was made in the right groin region for micropuncture access. The micropuncture needle was then advanced under ultrasound guidance. Ultrasound demonstrated the right  common femoral artery to be anechoic and pulsatile indicating patency. Final image obtained with the needle tip in the mid lumen of the right common femoral artery and stored in the patient's permanent medical record. Access was exchanged over a guidewire for a 6 [place] sheath. The catheter was then used to selectively cannulate the left renal artery. From this position near the origin a left renal angiogram was performed. An acute arterial hemorrhage is identified in the lower pole of the left kidney. Coaxial technique was then used and the catheter and guidewire were advanced into the inferior segmental artery and subsequent into the lower pole interlobar artery on the left side to selectively perform an angiogram of the interlobar artery. Once the catheter was in the correct position of the interlobar artery demonstrating the hemorrhage, coil embolization was then performed. Detachable coils were deployed in serial fashion beginning in the interlobar artery as the catheter was backed into the inferior segmental artery. After the deployment of 4 coils, repeat angiogram demonstrated no further hemorrhage. No further arterial flow to the region of interest. All catheters and guidewires removed from the patient. Hemostasis achieved with an Angio-Seal closure device. IMPRESSION: Satisfactory coil embolization of the left-sided lower pole renal hemorrhage. Electronically Signed   By: Cordella Banner   On: 06/30/2024 11:41   IR EMBO ART  VEN HEMORR LYMPH EXTRAV  INC GUIDE ROADMAPPING Result Date: 06/30/2024 INDICATION: Patient is status post left renal biopsy with an acute hemorrhage in the lower pole of the left kidney. Planned evaluation and potential embolization of a lower pole renal artery. EXAM: Angiogram MEDICATIONS: None. The antibiotic was administered within 1 hour of the procedure ANESTHESIA/SEDATION: Moderate (conscious) sedation was employed during this procedure. A total of Versed  4 mg and Fentanyl  200  mcg was administered intravenously by the radiology nurse. 1 mg Dilaudid  also administered during the procedure. Total intra-service moderate Sedation Time: 54 minutes. The patient's level of consciousness and vital signs were monitored continuously by radiology nursing throughout the procedure under my direct supervision. CONTRAST:  75 mL Omnipaque  300 FLUOROSCOPY: Radiation Exposure Index (as provided by the fluoroscopic device): 628 mGy Kerma COMPLICATIONS: None immediate. PROCEDURE: Informed consent was obtained from the patient following explanation of the procedure, risks, benefits and alternatives. The patient understands, agrees and consents for the procedure. All questions were addressed. A time out was performed prior to the initiation of the procedure. Maximal barrier sterile technique utilized including caps, mask, sterile gowns, sterile gloves, large sterile drape, hand hygiene, and Betadine prep. The patient was placed in a supine position on the IR table. The right groin was prepped and draped in usual sterile fashion. Local anesthesia was achieved with 1% lidocaine . A small incision was made in the right groin region for micropuncture access. The micropuncture needle was then advanced under ultrasound guidance. Ultrasound demonstrated the right common femoral artery to be anechoic and pulsatile indicating patency. Final image obtained with the needle tip in the mid lumen of the right common femoral artery and stored in the patient's permanent medical record. Access was exchanged over a guidewire for a 6 [place] sheath. The catheter was then used to selectively  cannulate the left renal artery. From this position near the origin a left renal angiogram was performed. An acute arterial hemorrhage is identified in the lower pole of the left kidney. Coaxial technique was then used and the catheter and guidewire were advanced into the inferior segmental artery and subsequent into the lower pole interlobar  artery on the left side to selectively perform an angiogram of the interlobar artery. Once the catheter was in the correct position of the interlobar artery demonstrating the hemorrhage, coil embolization was then performed. Detachable coils were deployed in serial fashion beginning in the interlobar artery as the catheter was backed into the inferior segmental artery. After the deployment of 4 coils, repeat angiogram demonstrated no further hemorrhage. No further arterial flow to the region of interest. All catheters and guidewires removed from the patient. Hemostasis achieved with an Angio-Seal closure device. IMPRESSION: Satisfactory coil embolization of the left-sided lower pole renal hemorrhage. Electronically Signed   By: Cordella Banner   On: 06/30/2024 11:41   IR US  Guide Vasc Access Right Result Date: 06/30/2024 INDICATION: Patient is status post left renal biopsy with an acute hemorrhage in the lower pole of the left kidney. Planned evaluation and potential embolization of a lower pole renal artery. EXAM: Angiogram MEDICATIONS: None. The antibiotic was administered within 1 hour of the procedure ANESTHESIA/SEDATION: Moderate (conscious) sedation was employed during this procedure. A total of Versed  4 mg and Fentanyl  200 mcg was administered intravenously by the radiology nurse. 1 mg Dilaudid  also administered during the procedure. Total intra-service moderate Sedation Time: 54 minutes. The patient's level of consciousness and vital signs were monitored continuously by radiology nursing throughout the procedure under my direct supervision. CONTRAST:  75 mL Omnipaque  300 FLUOROSCOPY: Radiation Exposure Index (as provided by the fluoroscopic device): 628 mGy Kerma COMPLICATIONS: None immediate. PROCEDURE: Informed consent was obtained from the patient following explanation of the procedure, risks, benefits and alternatives. The patient understands, agrees and consents for the procedure. All questions were  addressed. A time out was performed prior to the initiation of the procedure. Maximal barrier sterile technique utilized including caps, mask, sterile gowns, sterile gloves, large sterile drape, hand hygiene, and Betadine prep. The patient was placed in a supine position on the IR table. The right groin was prepped and draped in usual sterile fashion. Local anesthesia was achieved with 1% lidocaine . A small incision was made in the right groin region for micropuncture access. The micropuncture needle was then advanced under ultrasound guidance. Ultrasound demonstrated the right common femoral artery to be anechoic and pulsatile indicating patency. Final image obtained with the needle tip in the mid lumen of the right common femoral artery and stored in the patient's permanent medical record. Access was exchanged over a guidewire for a 6 [place] sheath. The catheter was then used to selectively cannulate the left renal artery. From this position near the origin a left renal angiogram was performed. An acute arterial hemorrhage is identified in the lower pole of the left kidney. Coaxial technique was then used and the catheter and guidewire were advanced into the inferior segmental artery and subsequent into the lower pole interlobar artery on the left side to selectively perform an angiogram of the interlobar artery. Once the catheter was in the correct position of the interlobar artery demonstrating the hemorrhage, coil embolization was then performed. Detachable coils were deployed in serial fashion beginning in the interlobar artery as the catheter was backed into the inferior segmental artery. After the deployment of 4  coils, repeat angiogram demonstrated no further hemorrhage. No further arterial flow to the region of interest. All catheters and guidewires removed from the patient. Hemostasis achieved with an Angio-Seal closure device. IMPRESSION: Satisfactory coil embolization of the left-sided lower pole renal  hemorrhage. Electronically Signed   By: Cordella Banner   On: 06/30/2024 11:41   IR Angiogram Selective Each Additional Vessel Result Date: 06/30/2024 INDICATION: Patient is status post left renal biopsy with an acute hemorrhage in the lower pole of the left kidney. Planned evaluation and potential embolization of a lower pole renal artery. EXAM: Angiogram MEDICATIONS: None. The antibiotic was administered within 1 hour of the procedure ANESTHESIA/SEDATION: Moderate (conscious) sedation was employed during this procedure. A total of Versed  4 mg and Fentanyl  200 mcg was administered intravenously by the radiology nurse. 1 mg Dilaudid  also administered during the procedure. Total intra-service moderate Sedation Time: 54 minutes. The patient's level of consciousness and vital signs were monitored continuously by radiology nursing throughout the procedure under my direct supervision. CONTRAST:  75 mL Omnipaque  300 FLUOROSCOPY: Radiation Exposure Index (as provided by the fluoroscopic device): 628 mGy Kerma COMPLICATIONS: None immediate. PROCEDURE: Informed consent was obtained from the patient following explanation of the procedure, risks, benefits and alternatives. The patient understands, agrees and consents for the procedure. All questions were addressed. A time out was performed prior to the initiation of the procedure. Maximal barrier sterile technique utilized including caps, mask, sterile gowns, sterile gloves, large sterile drape, hand hygiene, and Betadine prep. The patient was placed in a supine position on the IR table. The right groin was prepped and draped in usual sterile fashion. Local anesthesia was achieved with 1% lidocaine . A small incision was made in the right groin region for micropuncture access. The micropuncture needle was then advanced under ultrasound guidance. Ultrasound demonstrated the right common femoral artery to be anechoic and pulsatile indicating patency. Final image obtained with  the needle tip in the mid lumen of the right common femoral artery and stored in the patient's permanent medical record. Access was exchanged over a guidewire for a 6 [place] sheath. The catheter was then used to selectively cannulate the left renal artery. From this position near the origin a left renal angiogram was performed. An acute arterial hemorrhage is identified in the lower pole of the left kidney. Coaxial technique was then used and the catheter and guidewire were advanced into the inferior segmental artery and subsequent into the lower pole interlobar artery on the left side to selectively perform an angiogram of the interlobar artery. Once the catheter was in the correct position of the interlobar artery demonstrating the hemorrhage, coil embolization was then performed. Detachable coils were deployed in serial fashion beginning in the interlobar artery as the catheter was backed into the inferior segmental artery. After the deployment of 4 coils, repeat angiogram demonstrated no further hemorrhage. No further arterial flow to the region of interest. All catheters and guidewires removed from the patient. Hemostasis achieved with an Angio-Seal closure device. IMPRESSION: Satisfactory coil embolization of the left-sided lower pole renal hemorrhage. Electronically Signed   By: Cordella Banner   On: 06/30/2024 11:41   CT HEMATURIA WORKUP Addendum Date: 06/28/2024 ADDENDUM REPORT: 06/28/2024 23:50 ADDENDUM: Critical Value/emergent results were called by telephone at the time of interpretation on 06/28/2024 at 11:36 pm to provider [facility] Abiel Antrim , who verbally acknowledged these results. Electronically Signed   By: Francis Quam M.D.   On: 06/28/2024 23:50   Result Date: 06/28/2024 CLINICAL DATA:  The patient had a left renal biopsy on August 10 for history of chronic kidney disease stage 4, and has been having hematuria and weakness since then. EXAM: CT ABDOMEN AND PELVIS WITHOUT AND WITH CONTRAST  TECHNIQUE: Multidetector CT imaging of the abdomen and pelvis was performed following the standard protocol before and following the bolus administration of intravenous contrast. RADIATION DOSE REDUCTION: This exam was performed according to the departmental dose-optimization program which includes automated exposure control, adjustment of the mA and/or kV according to patient size and/or use of iterative reconstruction technique. CONTRAST:  OMNIPAQUE  IOHEXOL  350 MG/ML SOLN COMPARISON:  The only comparison CT is CT without contrast before the biopsy on 05/22/2024. The biopsy was done under ultrasound. FINDINGS: Lower chest: With noncontrast imaging, the cardiac blood pool has become less dense than the myocardium consistent with anemia. The cardiac size is normal. There are scattered calcifications in the lower mitral ring. No pericardial effusion. Lung bases are clear of infiltrates. There is eventration and mild elevation anterior right diaphragm. Increased linear atelectasis right lower lobe posterior base. Hepatobiliary: Solitary 2 cm rim calcified stone in the proximal gallbladder. No wall thickening or bile duct dilatation. The liver is 22 cm in length and mildly steatotic. There is no mass enhancement. Pancreas: Partially atrophic. No mass common ductal dilatation or inflammatory change. Spleen: No mass.  No splenomegaly. Adrenals/Urinary Tract: There is no adrenal or renal mass enhancement. No urinary stone or obstruction. Diminished and delayed excretion is noted bilaterally and symmetrically. There is an irregular posteroinferior left perinephric hematoma, maximum diameters of 7.5 cm transverse, 4.6 cm AP, and 3.0 cm craniocaudal, with minimal nonlocalizing hemorrhagic content layering posteriorly along the pararenal fascia. There is a linear track of contrast extending from the left lower pole parenchyma posteriorly into the hematoma on series 7 axial images 45-47 consistent with an active bleed. In  the bladder, there is trace air anteriorly which may be from catheterization. Foley catheter is in place. New heterogeneous material collects posteriorly in the bladder which is probably clotted blood and was not seen previously. There is mild thickening of the bladder versus underdistention. Correlate with urinalysis for possible cystitis. Some perivesical stranding does appear present along the bladder dome. Stomach/Bowel: No dilatation or wall thickening including the appendix. No mesenteric inflammatory change. Remote Roux-en-Y gastric bypass. Surgically widened small bowel segment again noted anterior mid abdomen. Moderate-to-large amount of retained stool again noted ascending, transverse and descending colon, increased. There is sigmoid diverticulosis without diverticulitis. Vascular/Lymphatic: Versus in the abdominal wall are noted emptying in the common femoral veins. There is a prominent hepatic portal vein measuring 16 mm, stable. No portal vein thrombosis. Aortic atherosclerosis. No aneurysm or dissection. Shotty subcentimeter retroperitoneal lymph nodes are unchanged. No enlarged abdominopelvic nodes. Reproductive: Enlarged prostate 5.4 cm transverse axis. Other: There are small inguinal fat hernias. No incarcerated hernia. No hemorrhage is seen apart from the left perinephric space bleed. No free air. Minimal presacral ascites increased since August 10. Musculoskeletal: Mild degenerative change lumbar spine. Pelvic enthesopathy. Partial ankylosis of both SI joints. L4-5 right paracentral disc protrusion again noted with mass effect on the right L5 nerve root. IMPRESSION: 1. 7.5 x 4.6 x 3.0 cm left posteroinferior perinephric hematoma with a linear track of contrast extending from the left lower pole parenchyma posteriorly into the hematoma consistent with an active bleed. 2. Diminished and delayed renal excretion bilaterally and symmetrically, consistent with history of stage IV CKD. 3. New  heterogeneous material in the bladder probably  clotted blood. 4. Cystitis versus bladder nondistention. 5. Constipation and diverticulosis. 6. Cholelithiasis. 7. Aortic atherosclerosis. 8. Anemia, new since August 10. 9. L4-5 right paracentral disc protrusion with mass effect on the right L5 nerve root. 10. PRA is attempting to reach the ordering physician for stat notification at the time of signing. Aortic Atherosclerosis (ICD10-I70.0). Electronically Signed: By: Francis Quam M.D. On: 06/28/2024 23:32       LOS: 14 days   Ole Bourdon, NP [facility] Pager: [phone]  06/30/2024, 12:19 PM

## 2024-06-30 NOTE — Assessment & Plan Note (Signed)
 CVA 4 weeks ago, on ASA prior to admission. ASA then was stopped for ASA washout pre-kidney biopsy (9/5 - 9/10) and continue to hold due  to hematoma/hematuria  - Neurology weighed in on 9/13- hold aspirin  until bleeding resolved  - Continue to hold ASA and SQH d/t remaining w/ blooding urine after replaced foley - Continue statin, ezetimibe 

## 2024-06-30 NOTE — Assessment & Plan Note (Signed)
 On PRN iHD per Nephrology due to no sign of renal recovery.  TDC placed by IR 9/12.  - Nephrology consultation, appreciate recommendations. He is now s/p left brachiocephalic arteriovenous fistula placement on 06/29/24 Consult vascular surgery re: AVF - Appreciate vascular surgery  iHD to day on TThS schedule  He has been accepted for outpatient HD TTS at [facility]   In the longer-term, he is interested in home therapy and kidney transplant evaluation. - Strict I's and O's - Electrolyte management as indicated

## 2024-06-30 NOTE — Progress Notes (Signed)
 PT Cancellation Note  Patient Details Name: Edward [name] MRN: [id number] DOB: 05/02/1971   Cancelled Treatment:    Reason Eval/Treat Not Completed: Patient at procedure or test/unavailable (Pt in urological procedure this am and going to HD in pm. Will check back tomorrow.)   [name] Bevel 06/30/2024, 9:34 AM [name] M,PT Acute Rehab Services [phone]

## 2024-06-30 NOTE — Progress Notes (Signed)
 Daily Progress Note Intern Pager: [phone]  Patient name: Edward [name] Medical record number: [id number] Date of birth: Jul 01, 1971 Age: 53 y.o. Gender: male  Primary Care Provider: Donzetta Rollene BRAVO, MD Consultants: Nephrology, urology, IR, Vascular surgery Code Status: Full code  Pt Overview and Major Events to Date:  9/4 : Admitted for back pain  9/11 : Renal biopsy with IR  9/12: TDC placed by IR, HD initiated 9/16 : Failed void trail. Place foley  9/18 : left brachiocephalic arteriovenous fistula placement   Assessment and Plan: 53 year old male with PMHx ankylosing spondylitis, CKDV, CVA, HTN, T2DM and, HLD, bipolar 1 and migraines presenting with back pain in the setting of flare of ankylosing spondylitis . Patient undergone Renal Biopsy on 06/22/24 w/ IR and initiated on HD per nephrology. His hospitalization c/b urinary retention required in and out cath then foley following by hematuria.He failed void trial on 9/16 and place foley back. Renal US  on 9/11 shows perirenal hemorrhage s/p renal biopsy and bladder hematoma. In addition, his kidney fx has also become worsening now on iHD. He is now s/p left brachiocephalic arteriovenous fistula placement with Dr. Gretta.   Foley output was clear this morning, urology team at bedside and suspected that it is the irrigation water . Plan on stop CBI this morning and reassess at noon. Per urology team if he continue w/ dark red urine again the plan is to proceed on Angio w/ Embolization w/ IR. Will plan on keep patient NPO in the means time. Will continue to hold off on restarting ASA and SQH until we certain that his bleeding completely stop  Assessment & Plan Perinephric hematoma Gross hematuria Urinary retention Stable H/H. CT Hematuria work up on 06/28/24 shows 7.5 x 4.6 x 3.0 cm left posteroinferior perinephric hematoma with a linear track of contrast extending from the left lower pole parenchyma posteriorly into the hematoma consistent  with an active bleed. Stable H/H  w/o blood transfusion. Not a candidate for TXA given recent CVA.  If worsening, consider IR embolization. Fail void trial on 9/16 then replaced foley overnight for 3 way foley for CBI. CBI pause this morning - Urology consult, appreciate recommendations - NPO for possible IR procedure - IR consulted for possible Angio w/ embolization  - Continue foley pause CBI until noon per Urology - Strict I&O - Monitor UOP - Continue Flomax  0.4 mg Daily ESRD (end stage renal disease) on dialysis (HCC) On PRN iHD per Nephrology due to no sign of renal recovery.  TDC placed by IR 9/12.  - Nephrology consultation, appreciate recommendations. He is now s/p left brachiocephalic arteriovenous fistula placement on 06/29/24 Consult vascular surgery re: AVF - Appreciate vascular surgery  iHD to day on TThS schedule  He has been accepted for outpatient HD TTS at [facility]   In the longer-term, he is interested in home therapy and kidney transplant evaluation. - Strict I's and O's - Electrolyte management as indicated H/O: CVA (cerebrovascular accident) CVA 4 weeks ago, on ASA prior to admission. ASA then was stopped for ASA washout pre-kidney biopsy (9/5 - 9/10) and continue to hold due  to hematoma/hematuria  - Neurology weighed in on 9/13- hold aspirin  until bleeding resolved  - Continue to hold ASA and SQH d/t remaining w/ blooding urine after replaced foley - Continue statin, ezetimibe   Chronic health problem Ankylosing spondylitis flare Well-controlled on PO pain regimen. - Continue prednisone  taper - Pain regimen: - Acetaminophen  650 mg every 8 hours scheduled -  Oxycodone  5-10 mg every 4 hours as needed for moderate/severe pain - Lidocaine  patches - Tizanidine  4 mg every 8 hours as needed - Need outpatient f/u with Rheumatology  - PT/OT follow  HTN: Continue home amLODipine  10 mg daily, Carvedilol  3.125 mg BID DMII: sSSI, Glargine 8 units started 9/14. Will plan on  keep his insulin  as is for now as he will completed his Prednisolone treatment today HLD: Continue Ezetimibe  10 mg daily Bipolar 1 disorder: Increase QUEtiapine  25 mg to 50 mg at bedtime since buspirone  is now stopped. Will repeat EKG tmrw morning Depression: Stopped Buspar  7.5 mg daily  Constipation: Continue MiraLAX  twice daily, senna daily : Stool burden on CT   FEN/GI: NPO - pre procedure PPx: SCDs, holding anticoagulation in the setting of hematuria Dispo: Home pending hematuria improvement HD chair placement  Subjective:  Doing  well this morning. Denies dizziness, lightheaded, decrease motor or sensation of left hand. Pt states pain is well control. Denies abdominal distension or pain.   Objective: Temp:  [97.5 F (36.4 C)-99.1 F (37.3 C)] 98 F (36.7 C) (09/18 0823) Pulse Rate:  [57-89] 57 (09/18 0823) Resp:  [12-20] 14 (09/18 0823) BP: (117-159)/(62-88) 136/74 (09/18 0823) SpO2:  [92 %-100 %] 100 % (09/18 0823)  Physical Exam: General: Sitting up in bed, NAD Cardiovascular: RRR without murmur Respiratory: CTAB.  Normal work of breathing on room air Abdomen: Soft, nontender, nondistended.  Foley catheter in place draining blood-tinged urine, Last BM 06/25/24 Extremities: No peripheral edema LUE 2+ radial pulses + thrill on fistula Vas cath : dressing w/ saturated blood GU : foley w/ clear urine output  Laboratory: Most recent CBC Lab Results  Component Value Date   WBC 14.4 (H) 06/30/2024   HGB 8.7 (L) 06/30/2024   HCT 27.2 (L) 06/30/2024   MCV 90.4 06/30/2024   PLT 298 06/30/2024   Most recent BMP    Latest Ref Rng & Units 06/30/2024    4:40 AM  BMP  Glucose 70 - 99 mg/dL 789   BUN 6 - 20 mg/dL 42   Creatinine 9.38 - 1.24 mg/dL 5.67   Sodium 864 - 854 mmol/L 135   Potassium 3.5 - 5.1 mmol/L 4.9   Chloride 98 - 111 mmol/L 96   CO2 22 - 32 mmol/L 27   Calcium  8.9 - 10.3 mg/dL 8.3     Other pertinent labs: Hepatitis B surface antigen:  Nonreactive  Imaging/Diagnostic Tests: IR TUNNELED CENTRAL VENOUS CATH [facility] W IMG Result Date: 06/24/2024 IMPRESSION: Successful placement of a right jugular tunneled dialysis catheter using ultrasound and fluoroscopic guidance. Electronically Signed   By: Juliene Balder M.D.   On: 06/24/2024 17:31     Suzen Houston NOVAK, DO 06/30/2024, 8:59 AM  PGY-1, [facility] FPTS Intern pager: [phone], text pages welcome Secure chat group [facility] Teaching Service

## 2024-06-30 NOTE — Procedures (Signed)
 Seen and examined on dialysis.  Procedure supervised.  Blood pressure 117/73 and HR 59.  Tolerating goal.  RIJ tunneled catheter in use.    Noted s/p left renal artery angio and selective renal artery embolization   Katheryn [name], MD 06/30/2024  2:36 PM

## 2024-07-01 DIAGNOSIS — M459 Ankylosing spondylitis of unspecified sites in spine: Secondary | ICD-10-CM | POA: Diagnosis not present

## 2024-07-01 DIAGNOSIS — M549 Dorsalgia, unspecified: Secondary | ICD-10-CM | POA: Diagnosis not present

## 2024-07-01 DIAGNOSIS — R339 Retention of urine, unspecified: Secondary | ICD-10-CM | POA: Diagnosis not present

## 2024-07-01 LAB — GLUCOSE, CAPILLARY
Glucose-Capillary: 108 mg/dL — ABNORMAL HIGH (ref 70–99)
Glucose-Capillary: 286 mg/dL — ABNORMAL HIGH (ref 70–99)
Glucose-Capillary: 172 mg/dL — ABNORMAL HIGH (ref 70–99)
Glucose-Capillary: 208 mg/dL — ABNORMAL HIGH (ref 70–99)

## 2024-07-01 LAB — VITAMIN C: Vitamin C: 0.4 mg/dL (ref 0.4–2.0)

## 2024-07-01 LAB — CBC
HCT: 25.8 % — ABNORMAL LOW (ref 39.0–52.0)
Hemoglobin: 8.2 g/dL — ABNORMAL LOW (ref 13.0–17.0)
MCH: 28.4 pg (ref 26.0–34.0)
MCHC: 31.8 g/dL (ref 30.0–36.0)
MCV: 89.3 fL (ref 80.0–100.0)
Platelets: 279 K/uL (ref 150–400)
RBC: 2.89 MIL/uL — ABNORMAL LOW (ref 4.22–5.81)
RDW: 15.5 % (ref 11.5–15.5)
WBC: 11.3 K/uL — ABNORMAL HIGH (ref 4.0–10.5)
nRBC: 0 % (ref 0.0–0.2)

## 2024-07-01 LAB — COPPER, SERUM: Copper: 67 ug/dL — ABNORMAL LOW (ref 69–132)

## 2024-07-01 LAB — VITAMIN B1: Vitamin B1 (Thiamine): 133.3 nmol/L (ref 66.5–200.0)

## 2024-07-01 LAB — ZINC: Zinc: 62 ug/dL (ref 44–115)

## 2024-07-01 MED ORDER — POLYETHYLENE GLYCOL 3350 17 G PO PACK
34.0000 g | PACK | Freq: Two times a day (BID) | ORAL | Status: DC
  Administered 2024-07-01 – 2024-07-02 (×2): 34 g via ORAL
  Filled 2024-07-01 (×2): qty 2

## 2024-07-01 MED ORDER — HEPARIN SODIUM (PORCINE) 5000 UNIT/ML IJ SOLN
5000.0000 [IU] | Freq: Three times a day (TID) | INTRAMUSCULAR | Status: DC
  Administered 2024-07-01 – 2024-07-02 (×4): 5000 [IU] via SUBCUTANEOUS
  Filled 2024-07-01 (×4): qty 1

## 2024-07-01 MED ORDER — CHLORHEXIDINE GLUCONATE CLOTH 2 % EX PADS: 6.0000 | MEDICATED_PAD | Freq: Every day | CUTANEOUS | Status: DC

## 2024-07-01 MED ORDER — ASPIRIN 81 MG PO CHEW
81.0000 mg | CHEWABLE_TABLET | Freq: Every day | ORAL | Status: DC
  Administered 2024-07-01 – 2024-07-02 (×2): 81 mg via ORAL
  Filled 2024-07-01 (×2): qty 1

## 2024-07-01 MED ORDER — POLYETHYLENE GLYCOL 3350 17 G PO PACK: 17.0000 g | PACK | Freq: Once | ORAL | Status: AC

## 2024-07-01 NOTE — Progress Notes (Signed)
  [facility] PROGRESS NOTE  Assessment/ Plan: Pt is a 53 y.o. yo male    # New ESRD - Patient with CKD5 progressed to new ESRD due to biopsy proven advanced diabetic glomerulosclerosis class IV, severe arterionephrosclerosis, secondary FSGS with severe interstitial fibrosis and tubular atrophy.  Rapidly progressive CKD.  UPCR almost 14 g. OP labs including ANCA, ASO, complement level, anti dsDNA ab, Hep B, Hep C, anti-GBM, anti-PLA2R ab negative. He is s/p kidney biopsy on 9/10 with DM and significant scarring as above.  S/P RIJ TDC followed by first HD on 9/12.  He had a left brachiocephalic AVF placed on 9/17 with vascular. --------------------- - HD per TTS schedule  - See Labs ordered for 9/20 am  - He has an outpatient HD unit at [facility] on a TTS schedule with financial clearance confirmed  - In the longer-term, he is interested in home therapy and kidney transplant evaluation.  Note history of gastric bypass - would need to discuss with surgery and review CT if he were to be interested in PD - Appreciate vascular surgery - he will need to follow-up with them in clinic re: his maturing AVF  # Bleeding from HD catheter site requiring prolonged pressure by the bedside nurse over the weekend.  IR evaluated.  S/p DDAVP  and resolved  # Gross hematuria after kidney biopsy:  - Reviewed kidney ultrasound results and CT results.   - Foley catheter in place and patient s/p CBI - Urology is following and they consulted IR for embolization  - IR performed left renal artery angio and selective renal artery embolization on 9/18  # Anemia of CKD and acute blood loss anemia due to hematuria. - Iron sat 51 % - on aranesp  60 mcg weekly on Thursdays and will need to increase outpatient   - Transfuse PRBC as needed per primary team    #Ankylosing spondylitis - With recent flare - Per primary team   #Hyperkalemia - Renal diet - s/p Lokelma  and now on  dialysis.  # Metabolic acidosis - off of sodium bicarbonate , now managed with HD.   #Hypertension - BP currently acceptable on current regimen - avoid hypotension  Disposition - urology is doing a voiding trial today.  Anticipate HD here tomorrow and then disposition per urology and primary team   Katheryn [name], MD 8:36 AM 07/01/2024     Subjective:  Last HD on 9/18 with 1 kg UF.  Recently on CBI so unsure if UOP is accurate.  2.1 liters is charted for UOP for 9/18.  He had left renal artery angio and selective renal artery embolization.  He had his foley catheter removed this morning and states that he did void once after this was removed.  He's grateful to be feeling better  Review of systems:     Denies shortness of breath or chest pain  Denies n/v    Objective Vital signs in last 24 hours: Vitals:   06/30/24 1807 06/30/24 2323 07/01/24 0424 07/01/24 0739  BP: (!) 169/110 129/72 130/75 137/76  Pulse: 89 (!) 56 (!) 57 (!) 57  Resp: 19 18 20 20   Temp:  98 F (36.7 C) 97.7 F (36.5 C) 98.5 F (36.9 C)  TempSrc:  Oral Oral Oral  SpO2: 100%  96% 96%  Weight:      Height:       Weight change:   Intake/Output Summary (Last 24 hours) at 07/01/2024 0836 Last data filed at 07/01/2024 0800  Gross per 24 hour  Intake 540 ml  Output 3400 ml  Net -2860 ml     Labs: RENAL PANEL Recent Labs  Lab 06/27/24 0556 06/28/24 0351 06/28/24 1505 06/29/24 0353 06/30/24 0440  NA 138 138 136 135 135  K 4.4 4.0 4.6 3.7 4.9  CL 101 100 99 93* 96*  CO2 25 27 27 26 27   GLUCOSE 177* 110* 208* 147* 210*  BUN 80* 48* 52* 30* 42*  CREATININE 5.06* 3.99* 4.44* 3.23* 4.32*  CALCIUM  7.7* 8.0* 8.1* 7.8* 8.3*  PHOS 4.3 3.9 4.0 3.4 6.2*  ALBUMIN 2.2* 2.1* 2.1* 2.0* 2.2*    Liver Function Tests: Recent Labs  Lab 06/28/24 1505 06/29/24 0353 06/30/24 0440  ALBUMIN 2.1* 2.0* 2.2*   No results for input(s): LIPASE, AMYLASE in the last 168 hours. No results for input(s):  AMMONIA in the last 168 hours. CBC: Recent Labs    03/21/24 1037 05/09/24 1159 06/19/24 0437 06/22/24 0510 06/27/24 2052 06/28/24 0351 06/28/24 1932 06/29/24 0353 06/29/24 1855 06/30/24 0440 07/01/24 0248  HGB 11.3*   < >  --    < >  --    < > 9.2* 8.0* 9.5* 8.7* 8.2*  MCV 93   < >  --    < >  --    < >  --  90.0  --  90.4 89.3  VITAMINB12  --   --   --   --  518  --   --   --   --   --   --   FOLATE  --   --   --   --  12.8  --   --   --   --   --   --   FERRITIN 60  --  120  --   --   --   --   --   --   --   --   TIBC 298  --  209*  --   --   --   --   --   --   --   --   IRON 52  --  107  --   --   --   --   --   --   --   --    < > = values in this interval not displayed.    Cardiac Enzymes: No results for input(s): CKTOTAL, CKMB, CKMBINDEX, TROPONINI in the last 168 hours.  CBG: Recent Labs  Lab 06/29/24 0911 06/29/24 1313 06/30/24 1144 06/30/24 2012 07/01/24 0645  GLUCAP 112* 136* 146* 139* 108*    Iron Studies:  No results for input(s): IRON, TIBC, TRANSFERRIN, FERRITIN in the last 72 hours.  Studies/Results: US  PELVIS LIMITED (TRANSABDOMINAL ONLY) Result Date: 06/30/2024 CLINICAL DATA:  Hematuria EXAM: LIMITED ULTRASOUND OF PELVIS TECHNIQUE: Limited transabdominal ultrasound examination of the pelvis was performed. COMPARISON:  None Available. FINDINGS: The bladder is decompressed by Foley catheter. No definitive hematoma is identified although extremely limited due to the decompression. IMPRESSION: Compressed bladder without acute abnormality. Electronically Signed   By: Oneil Devonshire M.D.   On: 06/30/2024 21:28   IR Angiogram Visceral Selective Result Date: 06/30/2024 INDICATION: Patient is status post left renal biopsy with an acute hemorrhage in the lower pole of the left kidney. Planned evaluation and potential embolization of a lower pole renal artery. EXAM: Angiogram MEDICATIONS: None. The antibiotic was administered within 1 hour of the  procedure ANESTHESIA/SEDATION: Moderate (conscious) sedation was employed  during this procedure. A total of Versed  4 mg and Fentanyl  200 mcg was administered intravenously by the radiology nurse. 1 mg Dilaudid  also administered during the procedure. Total intra-service moderate Sedation Time: 54 minutes. The patient's level of consciousness and vital signs were monitored continuously by radiology nursing throughout the procedure under my direct supervision. CONTRAST:  75 mL Omnipaque  300 FLUOROSCOPY: Radiation Exposure Index (as provided by the fluoroscopic device): 628 mGy Kerma COMPLICATIONS: None immediate. PROCEDURE: Informed consent was obtained from the patient following explanation of the procedure, risks, benefits and alternatives. The patient understands, agrees and consents for the procedure. All questions were addressed. A time out was performed prior to the initiation of the procedure. Maximal barrier sterile technique utilized including caps, mask, sterile gowns, sterile gloves, large sterile drape, hand hygiene, and Betadine prep. The patient was placed in a supine position on the IR table. The right groin was prepped and draped in usual sterile fashion. Local anesthesia was achieved with 1% lidocaine . A small incision was made in the right groin region for micropuncture access. The micropuncture needle was then advanced under ultrasound guidance. Ultrasound demonstrated the right common femoral artery to be anechoic and pulsatile indicating patency. Final image obtained with the needle tip in the mid lumen of the right common femoral artery and stored in the patient's permanent medical record. Access was exchanged over a guidewire for a 6 [place] sheath. The catheter was then used to selectively cannulate the left renal artery. From this position near the origin a left renal angiogram was performed. An acute arterial hemorrhage is identified in the lower pole of the left kidney. Coaxial technique was  then used and the catheter and guidewire were advanced into the inferior segmental artery and subsequent into the lower pole interlobar artery on the left side to selectively perform an angiogram of the interlobar artery. Once the catheter was in the correct position of the interlobar artery demonstrating the hemorrhage, coil embolization was then performed. Detachable coils were deployed in serial fashion beginning in the interlobar artery as the catheter was backed into the inferior segmental artery. After the deployment of 4 coils, repeat angiogram demonstrated no further hemorrhage. No further arterial flow to the region of interest. All catheters and guidewires removed from the patient. Hemostasis achieved with an Angio-Seal closure device. IMPRESSION: Satisfactory coil embolization of the left-sided lower pole renal hemorrhage. Electronically Signed   By: Cordella Banner   On: 06/30/2024 11:41   IR EMBO ART  VEN HEMORR LYMPH EXTRAV  INC GUIDE ROADMAPPING Result Date: 06/30/2024 INDICATION: Patient is status post left renal biopsy with an acute hemorrhage in the lower pole of the left kidney. Planned evaluation and potential embolization of a lower pole renal artery. EXAM: Angiogram MEDICATIONS: None. The antibiotic was administered within 1 hour of the procedure ANESTHESIA/SEDATION: Moderate (conscious) sedation was employed during this procedure. A total of Versed  4 mg and Fentanyl  200 mcg was administered intravenously by the radiology nurse. 1 mg Dilaudid  also administered during the procedure. Total intra-service moderate Sedation Time: 54 minutes. The patient's level of consciousness and vital signs were monitored continuously by radiology nursing throughout the procedure under my direct supervision. CONTRAST:  75 mL Omnipaque  300 FLUOROSCOPY: Radiation Exposure Index (as provided by the fluoroscopic device): 628 mGy Kerma COMPLICATIONS: None immediate. PROCEDURE: Informed consent was obtained from  the patient following explanation of the procedure, risks, benefits and alternatives. The patient understands, agrees and consents for the procedure. All questions were addressed. A time  out was performed prior to the initiation of the procedure. Maximal barrier sterile technique utilized including caps, mask, sterile gowns, sterile gloves, large sterile drape, hand hygiene, and Betadine prep. The patient was placed in a supine position on the IR table. The right groin was prepped and draped in usual sterile fashion. Local anesthesia was achieved with 1% lidocaine . A small incision was made in the right groin region for micropuncture access. The micropuncture needle was then advanced under ultrasound guidance. Ultrasound demonstrated the right common femoral artery to be anechoic and pulsatile indicating patency. Final image obtained with the needle tip in the mid lumen of the right common femoral artery and stored in the patient's permanent medical record. Access was exchanged over a guidewire for a 6 [place] sheath. The catheter was then used to selectively cannulate the left renal artery. From this position near the origin a left renal angiogram was performed. An acute arterial hemorrhage is identified in the lower pole of the left kidney. Coaxial technique was then used and the catheter and guidewire were advanced into the inferior segmental artery and subsequent into the lower pole interlobar artery on the left side to selectively perform an angiogram of the interlobar artery. Once the catheter was in the correct position of the interlobar artery demonstrating the hemorrhage, coil embolization was then performed. Detachable coils were deployed in serial fashion beginning in the interlobar artery as the catheter was backed into the inferior segmental artery. After the deployment of 4 coils, repeat angiogram demonstrated no further hemorrhage. No further arterial flow to the region of interest. All catheters and  guidewires removed from the patient. Hemostasis achieved with an Angio-Seal closure device. IMPRESSION: Satisfactory coil embolization of the left-sided lower pole renal hemorrhage. Electronically Signed   By: Cordella Banner   On: 06/30/2024 11:41   IR US  Guide Vasc Access Right Result Date: 06/30/2024 INDICATION: Patient is status post left renal biopsy with an acute hemorrhage in the lower pole of the left kidney. Planned evaluation and potential embolization of a lower pole renal artery. EXAM: Angiogram MEDICATIONS: None. The antibiotic was administered within 1 hour of the procedure ANESTHESIA/SEDATION: Moderate (conscious) sedation was employed during this procedure. A total of Versed  4 mg and Fentanyl  200 mcg was administered intravenously by the radiology nurse. 1 mg Dilaudid  also administered during the procedure. Total intra-service moderate Sedation Time: 54 minutes. The patient's level of consciousness and vital signs were monitored continuously by radiology nursing throughout the procedure under my direct supervision. CONTRAST:  75 mL Omnipaque  300 FLUOROSCOPY: Radiation Exposure Index (as provided by the fluoroscopic device): 628 mGy Kerma COMPLICATIONS: None immediate. PROCEDURE: Informed consent was obtained from the patient following explanation of the procedure, risks, benefits and alternatives. The patient understands, agrees and consents for the procedure. All questions were addressed. A time out was performed prior to the initiation of the procedure. Maximal barrier sterile technique utilized including caps, mask, sterile gowns, sterile gloves, large sterile drape, hand hygiene, and Betadine prep. The patient was placed in a supine position on the IR table. The right groin was prepped and draped in usual sterile fashion. Local anesthesia was achieved with 1% lidocaine . A small incision was made in the right groin region for micropuncture access. The micropuncture needle was then advanced  under ultrasound guidance. Ultrasound demonstrated the right common femoral artery to be anechoic and pulsatile indicating patency. Final image obtained with the needle tip in the mid lumen of the right common femoral artery and stored in  the patient's permanent medical record. Access was exchanged over a guidewire for a 6 [place] sheath. The catheter was then used to selectively cannulate the left renal artery. From this position near the origin a left renal angiogram was performed. An acute arterial hemorrhage is identified in the lower pole of the left kidney. Coaxial technique was then used and the catheter and guidewire were advanced into the inferior segmental artery and subsequent into the lower pole interlobar artery on the left side to selectively perform an angiogram of the interlobar artery. Once the catheter was in the correct position of the interlobar artery demonstrating the hemorrhage, coil embolization was then performed. Detachable coils were deployed in serial fashion beginning in the interlobar artery as the catheter was backed into the inferior segmental artery. After the deployment of 4 coils, repeat angiogram demonstrated no further hemorrhage. No further arterial flow to the region of interest. All catheters and guidewires removed from the patient. Hemostasis achieved with an Angio-Seal closure device. IMPRESSION: Satisfactory coil embolization of the left-sided lower pole renal hemorrhage. Electronically Signed   By: Cordella Banner   On: 06/30/2024 11:41   IR Angiogram Selective Each Additional Vessel Result Date: 06/30/2024 INDICATION: Patient is status post left renal biopsy with an acute hemorrhage in the lower pole of the left kidney. Planned evaluation and potential embolization of a lower pole renal artery. EXAM: Angiogram MEDICATIONS: None. The antibiotic was administered within 1 hour of the procedure ANESTHESIA/SEDATION: Moderate (conscious) sedation was employed during this  procedure. A total of Versed  4 mg and Fentanyl  200 mcg was administered intravenously by the radiology nurse. 1 mg Dilaudid  also administered during the procedure. Total intra-service moderate Sedation Time: 54 minutes. The patient's level of consciousness and vital signs were monitored continuously by radiology nursing throughout the procedure under my direct supervision. CONTRAST:  75 mL Omnipaque  300 FLUOROSCOPY: Radiation Exposure Index (as provided by the fluoroscopic device): 628 mGy Kerma COMPLICATIONS: None immediate. PROCEDURE: Informed consent was obtained from the patient following explanation of the procedure, risks, benefits and alternatives. The patient understands, agrees and consents for the procedure. All questions were addressed. A time out was performed prior to the initiation of the procedure. Maximal barrier sterile technique utilized including caps, mask, sterile gowns, sterile gloves, large sterile drape, hand hygiene, and Betadine prep. The patient was placed in a supine position on the IR table. The right groin was prepped and draped in usual sterile fashion. Local anesthesia was achieved with 1% lidocaine . A small incision was made in the right groin region for micropuncture access. The micropuncture needle was then advanced under ultrasound guidance. Ultrasound demonstrated the right common femoral artery to be anechoic and pulsatile indicating patency. Final image obtained with the needle tip in the mid lumen of the right common femoral artery and stored in the patient's permanent medical record. Access was exchanged over a guidewire for a 6 [place] sheath. The catheter was then used to selectively cannulate the left renal artery. From this position near the origin a left renal angiogram was performed. An acute arterial hemorrhage is identified in the lower pole of the left kidney. Coaxial technique was then used and the catheter and guidewire were advanced into the inferior segmental  artery and subsequent into the lower pole interlobar artery on the left side to selectively perform an angiogram of the interlobar artery. Once the catheter was in the correct position of the interlobar artery demonstrating the hemorrhage, coil embolization was then performed. Detachable coils were deployed  in serial fashion beginning in the interlobar artery as the catheter was backed into the inferior segmental artery. After the deployment of 4 coils, repeat angiogram demonstrated no further hemorrhage. No further arterial flow to the region of interest. All catheters and guidewires removed from the patient. Hemostasis achieved with an Angio-Seal closure device. IMPRESSION: Satisfactory coil embolization of the left-sided lower pole renal hemorrhage. Electronically Signed   By: Cordella Banner   On: 06/30/2024 11:41     Medications: Infusions:  sodium chloride  irrigation      Scheduled Medications:  acetaminophen   650 mg Oral Q8H   amLODipine   10 mg Oral Daily   atorvastatin   40 mg Oral Daily   bisacodyl   10 mg Rectal Once   carvedilol   3.125 mg Oral BID   Chlorhexidine  Gluconate Cloth  6 each Topical Q0600   darbepoetin (ARANESP ) injection - DIALYSIS  60 mcg Subcutaneous Q Thu-1800   ezetimibe   10 mg Oral Daily   feeding supplement (NEPRO CARB STEADY)  237 mL Oral BID   ferrous sulfate   325 mg Oral QODAY   insulin  aspart  0-15 Units Subcutaneous TID WC   insulin  glargine  5 Units Subcutaneous Daily   lidocaine   2 patch Transdermal Q24H   lidocaine   1 Application Topical Once   multivitamin  1 tablet Oral QHS   pantoprazole   40 mg Oral Daily   polyethylene glycol  17 g Oral BID   QUEtiapine   50 mg Oral QHS   senna  2 tablet Oral BID   tamsulosin   0.4 mg Oral Daily    have reviewed scheduled and prn medications.  Physical Exam:       General adult male in bed in no acute distress HEENT normocephalic atraumatic extraocular movements intact sclera anicteric Neck supple trachea  midline Lungs clear to auscultation bilaterally normal work of breathing at rest on room air Heart S1S2 no rub Abdomen soft nontender obese habitus Extremities no edema  Psych normal mood and affect Neuro - alert and oriented x 3 provides hx and follows commands  GU - no foley Access RIJ tunneled catheter; LUE AVF with bruit and thrill      Katheryn [name] 07/01/2024,8:56 AM  LOS: 15 days

## 2024-07-01 NOTE — TOC Progression Note (Signed)
 Transition of Care [facility]) - Progression Note    Patient Details  Name: Edward [name] MRN: [id number] Date of Birth: 1971-02-06  Transition of Care [facility] CM/SW Contact  Andrez JULIANNA George, RN Phone Number: 07/01/2024, 3:07 PM  Clinical Narrative:     PT no longer feels pt needs home therapies. Pt in agreement. Pt has assistance and supervision at home per his parents.  Pt has transport home when medically ready.   Expected Discharge Plan: Home w Home Health Services Barriers to Discharge: Continued Medical Work up, SNF Pending bed offer               Expected Discharge Plan and Services In-house Referral: Clinical Social Work   Post Acute Care Choice: Skilled Nursing Facility Living arrangements for the past 2 months: Single Family Home                                       Social Drivers of Health (SDOH) Interventions SDOH Screenings   Food Insecurity: No Food Insecurity (06/16/2024)  Housing: Low Risk  (06/16/2024)  Transportation Needs: No Transportation Needs (06/16/2024)  Utilities: Not At Risk (06/16/2024)  Alcohol Screen: Low Risk  (03/20/2024)  Depression (PHQ2-9): High Risk (06/03/2024)  Financial Resource Strain: Patient Declined (03/20/2024)  Physical Activity: Insufficiently Active (03/20/2024)  Social Connections: Socially Isolated (06/16/2024)  Stress: Stress Concern Present (03/20/2024)  Tobacco Use: Medium Risk (06/29/2024)    Readmission Risk Interventions     No data to display

## 2024-07-01 NOTE — Assessment & Plan Note (Signed)
 CVA 4 weeks ago, on ASA prior to admission. ASA then was stopped for ASA washout pre-kidney biopsy (9/5 - 9/10) and continue to hold due  to hematoma/hematuria  - Restart ASA and SQH - Continue statin, ezetimibe   - AM CBC

## 2024-07-01 NOTE — Progress Notes (Signed)
 Referring Provider(s): Dr. Valli Shank, Md  Supervising Physician: Jenna Hacker  Patient Status:  [facility] - In-pt  Chief Complaints: Back pain; CKD stage V. Patient underwent a right renal biopsy c/b subsequent perirenal hematoma s/p embolization on 9/17.  Subjective:  Patient alert and laying in bed, calm.  Currently without any significant complaints. He is feeling a lot better, and looking forward to discharge. Patient denies any fevers, headache, chest pain, SOB, cough, abdominal pain, nausea, vomiting or bleeding.    Allergies: Demerol [meperidine], Nsaids, Tapentadol, and Tramadol   Medications: Prior to Admission medications   Medication Sig Start Date End Date Taking? Authorizing Provider  acetaminophen  (ACETAMINOPHEN  8 HOUR) 650 MG CR tablet Take 1,300 mg by mouth 2 (two) times daily.   Yes [provider]  amLODipine  (NORVASC ) 10 MG tablet Take 1 tablet (10 mg total) by mouth daily. 05/09/24  Yes Myrna Bitters, DO  aspirin  EC 81 MG tablet Take 1 tablet (81 mg total) by mouth daily. Swallow whole. 05/25/24  Yes Everhart, Kirstie, DO  atorvastatin  (LIPITOR) 40 MG tablet Take 1 tablet (40 mg total) by mouth daily. 05/25/24  Yes Everhart, Kirstie, DO  carvedilol  (COREG ) 3.125 MG tablet Take 3.125 mg by mouth 2 (two) times daily. 05/18/24  Yes [provider]  diclofenac Sodium (VOLTAREN) 1 % GEL Apply 4 g topically in the morning and at bedtime. Apply topically to neck and lumbar spine twice per day.   Yes [provider]  ezetimibe  (ZETIA ) 10 MG tablet Take 1 tablet (10 mg total) by mouth daily. 05/25/24  Yes Everhart, Kirstie, DO  ferrous sulfate  325 (65 FE) MG tablet Take 1 tablet (325 mg total) by mouth daily with breakfast. 03/21/24  Yes Pray, Rollene BRAVO, MD  pantoprazole  (PROTONIX ) 40 MG tablet Take 1 tablet (40 mg total) by mouth daily. 05/25/24  Yes Everhart, Kirstie, DO  QUEtiapine  (SEROQUEL ) 25 MG tablet Take 1 tablet 25mg  at night for three  days, if not oversedated can increase to 2 tablets 50mg  total at night. Patient taking differently: 50 mg. 06/03/24  Yes Pray, Rollene BRAVO, MD  feeding supplement, GLUCERNA SHAKE, (GLUCERNA SHAKE) LIQD Take 237 mLs by mouth 3 (three) times daily between meals. 06/22/24   Gomes, Adriana, DO  lidocaine  (LIDODERM ) 5 % Place 2 patches onto the skin daily. Remove & Discard patch within 12 hours or as directed by MD 06/23/24   Janna Ferrier, DO  oxyCODONE  (OXY IR/ROXICODONE ) 5 MG immediate release tablet Take 1 tablet (5 mg total) by mouth every 6 (six) hours as needed for up to 14 days for severe pain (pain score 7-10) or moderate pain (pain score 4-6). 06/22/24 07/06/24  Gomes, Adriana, DO  polyethylene glycol powder (GLYCOLAX /MIRALAX ) 17 GM/SCOOP powder Dissolve 1 capful (17g) in 4-8 ounces of liquid and take by mouth twice daily 06/22/24   Gomes, Adriana, DO  senna (SENOKOT) 8.6 MG TABS tablet Take 2 tablets (17.2 mg total) by mouth daily. 06/23/24   Gomes, Adriana, DO  sodium bicarbonate  650 MG tablet Take 2 tablets (1,300 mg total) by mouth 2 (two) times daily. 06/22/24   Gomes, Adriana, DO  tiZANidine  (ZANAFLEX ) 4 MG tablet Take 1 tablet (4 mg total) by mouth every 8 (eight) hours as needed for muscle spasms. 06/22/24   Janna Ferrier, DO     Vital Signs: BP 123/75 (BP Location: Right Arm)   Pulse 68   Temp 98.4 F (36.9 C) (Oral)   Resp 18   Ht 6' (1.829  m)   Wt 267 lb 13.7 oz (121.5 kg)   SpO2 97%   BMI 36.33 kg/m   Physical Exam Constitutional:      Appearance: Normal appearance.  Neck:     Comments: Good bilateral pedal pulses. Cardiovascular:     Rate and Rhythm: Normal rate.     Pulses: Normal pulses.  Pulmonary:     Effort: Pulmonary effort is normal.  Genitourinary:    Comments: Foley removed. Musculoskeletal:        General: Normal range of motion.     Comments: Minimal right-sided CVA tenderness.  Skin:    General: Skin is warm and dry.     Comments: Right femoral artery  access site appropriately dressed. Dressing is clean, dry, intact. Site is minimally tender, without evidence of bleeding, ecchymosis, fluctuance, bruits, or evidence of extravasation.  Neurological:     Mental Status: He is alert and oriented to person, place, and time.      Labs:  CBC: Recent Labs    06/28/24 1445 06/28/24 1932 06/29/24 0353 06/29/24 1855 06/30/24 0440 07/01/24 0248  WBC 15.0*  --  14.1*  --  14.4* 11.3*  HGB 8.4*   < > 8.0* 9.5* 8.7* 8.2*  HCT 26.5*   < > 25.2* 30.2* 27.2* 25.8*  PLT 310  --  287  --  298 279   < > = values in this interval not displayed.    COAGS: Recent Labs    06/15/24 1059 06/22/24 0510 06/30/24 0440  INR 1.2 1.1 1.1  APTT 40*  --   --     BMP: Recent Labs    06/28/24 0351 06/28/24 1505 06/29/24 0353 06/30/24 0440  NA 138 136 135 135  K 4.0 4.6 3.7 4.9  CL 100 99 93* 96*  CO2 27 27 26 27   GLUCOSE 110* 208* 147* 210*  BUN 48* 52* 30* 42*  CALCIUM  8.0* 8.1* 7.8* 8.3*  CREATININE 3.99* 4.44* 3.23* 4.32*  GFRNONAA 17* 15* 22* 16*    LIVER FUNCTION TESTS: Recent Labs    05/09/24 1159 05/22/24 0837 05/23/24 0130 06/15/24 1059 06/18/24 0632 06/28/24 0351 06/28/24 1505 06/29/24 0353 06/30/24 0440  BILITOT 0.6 0.5  --  0.6  --   --   --   --   --   AST 20 19  --  19  --   --   --   --   --   ALT 10 9  --  10  --   --   --   --   --   ALKPHOS 87 86  --  111  --   --   --   --   --   PROT 6.5 6.4*  --  7.3  --   --   --   --   --   ALBUMIN 2.7* 2.7*   < > 2.3*   < > 2.1* 2.1* 2.0* 2.2*   < > = values in this interval not displayed.    Assessment and Plan:  VSS. Patient feeling notably improved. Femoral access site without concern for bleeding or pseudoaneurysm. Minimal CVA tenderness. Patient to discharge soon. Outpatient order placed.  IR will continue to follow.    Thank you for this interesting consult.  I greatly enjoyed meeting Edward [name] and look forward to participating in their care.    Electronically Signed: Carlin DELENA Griffon, PA-C 07/01/2024, 4:33 PM     I spent a total of 15  Minutes at the the patient's bedside AND on the patient's hospital floor or unit, greater than 50% of which was counseling/coordinating care for renal embolization follow-up.

## 2024-07-01 NOTE — Progress Notes (Signed)
 Daily Progress Note Intern Pager: [phone]  Patient name: Edward [name] Medical record number: [id number] Date of birth: Jun 05, 1971 Age: 53 y.o. Gender: male  Primary Care Provider: Donzetta Rollene BRAVO, MD Consultants: Nephrology, urology, IR, Vascular surgery Code Status: Full code / Pt Overview an/d Major Events to Date:  9/4 : Admitted for back pain  9/11 : Renal biopsy with IR  9/12: TDC placed by IR, HD initiated 9/16 : Failed void trail. Place foley  9/18 : left brachiocephalic arteriovenous fistula placement   Assessment and Plan: 53 year old male with PMHx ankylosing spondylitis, CKDV, CVA, HTN, T2DM and, HLD, bipolar 1 and migraines presenting with back pain in the setting of flare of ankylosing spondylitis . Patient undergone Renal Biopsy on 06/22/24 w/ IR and initiated on HD per nephrology. His hospitalization c/b urinary retention required in and out cath then foley following by hematuria.He failed void trial on 9/16 and place foley back. Renal US  on 9/11 shows perirenal hemorrhage s/p renal biopsy and bladder hematoma which he is now s/p Renal a. embolization. In addition, his kidney fx has also become worsening now on iHD. He is now s/p left brachiocephalic arteriovenous fistula placement with Dr. Gretta.  Assessment & Plan Perinephric hematoma Gross hematuria Urinary retention Stable H/H. CT Hematuria work up on 06/28/24 shows 7.5 x 4.6 x 3.0 cm left posteroinferior perinephric hematoma with a linear track of contrast extending from the left lower pole parenchyma posteriorly into the hematoma consistent with an active bleed.  He is now s/p IR angio w/ selective renal a. embolization on 06/30/24 - Urology consult, appreciate recommendations Void trial DTV at 4 pm today Bladder scan Q3H - Renal diet - Strict I&O - Monitor UOP - Continue Flomax  0.4 mg Daily ESRD (end stage renal disease) on dialysis (HCC) On PRN iHD per Nephrology due to no sign of renal recovery.  TDC  placed by IR 9/12.  - Nephrology consultation, appreciate recommendations. He is now s/p left brachiocephalic arteriovenous fistula placement on 06/29/24 Consult vascular surgery re: AVF - Appreciate vascular surgery  iHD to day on TThS schedule  He has been accepted for outpatient HD TTS at [facility]   In the longer-term, he is interested in home therapy and kidney transplant evaluation. - RFP on Dialysis day  - Strict I's and O's - Electrolyte management as indicated H/O: CVA (cerebrovascular accident) CVA 4 weeks ago, on ASA prior to admission. ASA then was stopped for ASA washout pre-kidney biopsy (9/5 - 9/10) and continue to hold due  to hematoma/hematuria  - Restart ASA and SQH - Continue statin, ezetimibe   - AM CBC Chronic health problem Ankylosing spondylitis flare Well-controlled on PO pain regimen. - Continue prednisone  taper - Pain regimen: - Acetaminophen  650 mg every 8 hours scheduled - Oxycodone  5-10 mg every 4 hours as needed for moderate/severe pain - Lidocaine  patches - Tizanidine  4 mg every 8 hours as needed - Need outpatient f/u with Rheumatology  - PT/OT follow  HTN: Continue home amLODipine  10 mg daily, Carvedilol  3.125 mg BID DMII: sSSI, Glargine 8 units started 9/14. Will plan on keep his insulin  as is for now as he will completed his Prednisolone treatment today HLD: Continue Ezetimibe  10 mg daily Bipolar 1 disorder: Increase QUEtiapine  25 mg to 50 mg at bedtime since buspirone  is now stopped. Will repeat EKG tmrw morning Depression: Stopped Buspar  7.5 mg daily  Constipation: Continue MiraLAX  twice daily, senna daily : Stool burden on CT   FEN/GI: Renal  diet PPx: SCDs, SQH Dispo: Home when clinically ready  Subjective:  Doing  well this morning. OOB to chair for breakfast. State he is doing well. Not urinate yet since foley removed this morning.   Objective: Temp:  [97.7 F (36.5 C)-98.6 F (37 C)] 98.4 F (36.9 C) (09/19 1100) Pulse Rate:  [55-89]  67 (09/19 1100) Resp:  [12-20] 18 (09/19 1100) BP: (104-169)/(65-110) 104/67 (09/19 1100) SpO2:  [94 %-100 %] 99 % (09/19 1100) Weight:  [121.5 kg] 121.5 kg (09/18 1313)  Physical Exam: General: Sitting up in bed, NAD Cardiovascular: RRR without murmur Respiratory: CTAB.  Normal work of breathing on room air Abdomen: Soft, nontender, nondistended.  Foley catheter in place draining blood-tinged urine, Last BM 06/25/24 Extremities: No peripheral edema LUE 2+ radial pulses + thrill on fistula Vas cath : CDI LUE : Fistula - thrilling 2+ radius pulses  Laboratory: Most recent CBC Lab Results  Component Value Date   WBC 11.3 (H) 07/01/2024   HGB 8.2 (L) 07/01/2024   HCT 25.8 (L) 07/01/2024   MCV 89.3 07/01/2024   PLT 279 07/01/2024   Most recent BMP    Latest Ref Rng & Units 06/30/2024    4:40 AM  BMP  Glucose 70 - 99 mg/dL 789   BUN 6 - 20 mg/dL 42   Creatinine 9.38 - 1.24 mg/dL 5.67   Sodium 864 - 854 mmol/L 135   Potassium 3.5 - 5.1 mmol/L 4.9   Chloride 98 - 111 mmol/L 96   CO2 22 - 32 mmol/L 27   Calcium  8.9 - 10.3 mg/dL 8.3     Other pertinent labs: Hepatitis B surface antigen: Nonreactive  Imaging/Diagnostic Tests: None  Suzen Houston NOVAK, DO 07/01/2024, 12:09 PM  PGY-1, [facility] FPTS Intern pager: [phone], text pages welcome Secure chat group [facility] Teaching Service

## 2024-07-01 NOTE — Plan of Care (Signed)

## 2024-07-01 NOTE — Progress Notes (Signed)
 2 Days Post-Op Subjective: CBI on very slow gtt. Lightly tinged with old blood. No clots  Objective: Vital signs in last 24 hours: Temp:  [97.5 F (36.4 C)-98.6 F (37 C)] 98.5 F (36.9 C) (09/19 0739) Pulse Rate:  [55-89] 57 (09/19 0739) Resp:  [8-26] 20 (09/19 0739) BP: (111-169)/(65-110) 137/76 (09/19 0739) SpO2:  [93 %-100 %] 96 % (09/19 0739) Weight:  [121.5 kg] 121.5 kg (09/18 1313)  Assessment/Plan: # Gross hematuria following renal biopsy # Blood products in bladder  Patient was later taken to interventional radiology for selective embolization.  Cleared overnight with CBI  Bladder US  did not show clot accumulation. Clear and decompressed. TOV today. fill and pull at 8am. Urinal provided. Bladder scan q3.   Intake/Output from previous day: 09/18 0701 - 09/19 0700 In: 240 [P.O.:240] Out: 3100 [Urine:2100]  Intake/Output this shift: No intake/output data recorded.  Physical Exam:  General: Alert and oriented CV: No cyanosis Lungs: equal chest rise Abdomen: Soft, NTND, no rebound or guarding Gu: Three-way 4f in place draining dark red irrigant  Lab Results: Recent Labs    06/29/24 1855 06/30/24 0440 07/01/24 0248  HGB 9.5* 8.7* 8.2*  HCT 30.2* 27.2* 25.8*   BMET Recent Labs    06/29/24 0353 06/29/24 1855 06/30/24 0440 07/01/24 0248  NA 135  --  135  --   K 3.7  --  4.9  --   CL 93*  --  96*  --   CO2 26  --  27  --   GLUCOSE 147*  --  210*  --   BUN 30*  --  42*  --   CREATININE 3.23*  --  4.32*  --   CALCIUM  7.8*  --  8.3*  --   HGB 8.0*   < > 8.7* 8.2*  WBC 14.1*  --  14.4* 11.3*   < > = values in this interval not displayed.     Studies/Results: US  PELVIS LIMITED (TRANSABDOMINAL ONLY) Result Date: 06/30/2024 CLINICAL DATA:  Hematuria EXAM: LIMITED ULTRASOUND OF PELVIS TECHNIQUE: Limited transabdominal ultrasound examination of the pelvis was performed. COMPARISON:  None Available. FINDINGS: The bladder is decompressed by Foley  catheter. No definitive hematoma is identified although extremely limited due to the decompression. IMPRESSION: Compressed bladder without acute abnormality. Electronically Signed   By: Oneil Devonshire M.D.   On: 06/30/2024 21:28   IR Angiogram Visceral Selective Result Date: 06/30/2024 INDICATION: Patient is status post left renal biopsy with an acute hemorrhage in the lower pole of the left kidney. Planned evaluation and potential embolization of a lower pole renal artery. EXAM: Angiogram MEDICATIONS: None. The antibiotic was administered within 1 hour of the procedure ANESTHESIA/SEDATION: Moderate (conscious) sedation was employed during this procedure. A total of Versed  4 mg and Fentanyl  200 mcg was administered intravenously by the radiology nurse. 1 mg Dilaudid  also administered during the procedure. Total intra-service moderate Sedation Time: 54 minutes. The patient's level of consciousness and vital signs were monitored continuously by radiology nursing throughout the procedure under my direct supervision. CONTRAST:  75 mL Omnipaque  300 FLUOROSCOPY: Radiation Exposure Index (as provided by the fluoroscopic device): 628 mGy Kerma COMPLICATIONS: None immediate. PROCEDURE: Informed consent was obtained from the patient following explanation of the procedure, risks, benefits and alternatives. The patient understands, agrees and consents for the procedure. All questions were addressed. A time out was performed prior to the initiation of the procedure. Maximal barrier sterile technique utilized including caps, mask, sterile gowns, sterile  gloves, large sterile drape, hand hygiene, and Betadine prep. The patient was placed in a supine position on the IR table. The right groin was prepped and draped in usual sterile fashion. Local anesthesia was achieved with 1% lidocaine . A small incision was made in the right groin region for micropuncture access. The micropuncture needle was then advanced under ultrasound  guidance. Ultrasound demonstrated the right common femoral artery to be anechoic and pulsatile indicating patency. Final image obtained with the needle tip in the mid lumen of the right common femoral artery and stored in the patient's permanent medical record. Access was exchanged over a guidewire for a 6 [place] sheath. The catheter was then used to selectively cannulate the left renal artery. From this position near the origin a left renal angiogram was performed. An acute arterial hemorrhage is identified in the lower pole of the left kidney. Coaxial technique was then used and the catheter and guidewire were advanced into the inferior segmental artery and subsequent into the lower pole interlobar artery on the left side to selectively perform an angiogram of the interlobar artery. Once the catheter was in the correct position of the interlobar artery demonstrating the hemorrhage, coil embolization was then performed. Detachable coils were deployed in serial fashion beginning in the interlobar artery as the catheter was backed into the inferior segmental artery. After the deployment of 4 coils, repeat angiogram demonstrated no further hemorrhage. No further arterial flow to the region of interest. All catheters and guidewires removed from the patient. Hemostasis achieved with an Angio-Seal closure device. IMPRESSION: Satisfactory coil embolization of the left-sided lower pole renal hemorrhage. Electronically Signed   By: Cordella Banner   On: 06/30/2024 11:41   IR EMBO ART  VEN HEMORR LYMPH EXTRAV  INC GUIDE ROADMAPPING Result Date: 06/30/2024 INDICATION: Patient is status post left renal biopsy with an acute hemorrhage in the lower pole of the left kidney. Planned evaluation and potential embolization of a lower pole renal artery. EXAM: Angiogram MEDICATIONS: None. The antibiotic was administered within 1 hour of the procedure ANESTHESIA/SEDATION: Moderate (conscious) sedation was employed during this  procedure. A total of Versed  4 mg and Fentanyl  200 mcg was administered intravenously by the radiology nurse. 1 mg Dilaudid  also administered during the procedure. Total intra-service moderate Sedation Time: 54 minutes. The patient's level of consciousness and vital signs were monitored continuously by radiology nursing throughout the procedure under my direct supervision. CONTRAST:  75 mL Omnipaque  300 FLUOROSCOPY: Radiation Exposure Index (as provided by the fluoroscopic device): 628 mGy Kerma COMPLICATIONS: None immediate. PROCEDURE: Informed consent was obtained from the patient following explanation of the procedure, risks, benefits and alternatives. The patient understands, agrees and consents for the procedure. All questions were addressed. A time out was performed prior to the initiation of the procedure. Maximal barrier sterile technique utilized including caps, mask, sterile gowns, sterile gloves, large sterile drape, hand hygiene, and Betadine prep. The patient was placed in a supine position on the IR table. The right groin was prepped and draped in usual sterile fashion. Local anesthesia was achieved with 1% lidocaine . A small incision was made in the right groin region for micropuncture access. The micropuncture needle was then advanced under ultrasound guidance. Ultrasound demonstrated the right common femoral artery to be anechoic and pulsatile indicating patency. Final image obtained with the needle tip in the mid lumen of the right common femoral artery and stored in the patient's permanent medical record. Access was exchanged over a guidewire for a 6 [place]  sheath. The catheter was then used to selectively cannulate the left renal artery. From this position near the origin a left renal angiogram was performed. An acute arterial hemorrhage is identified in the lower pole of the left kidney. Coaxial technique was then used and the catheter and guidewire were advanced into the inferior segmental  artery and subsequent into the lower pole interlobar artery on the left side to selectively perform an angiogram of the interlobar artery. Once the catheter was in the correct position of the interlobar artery demonstrating the hemorrhage, coil embolization was then performed. Detachable coils were deployed in serial fashion beginning in the interlobar artery as the catheter was backed into the inferior segmental artery. After the deployment of 4 coils, repeat angiogram demonstrated no further hemorrhage. No further arterial flow to the region of interest. All catheters and guidewires removed from the patient. Hemostasis achieved with an Angio-Seal closure device. IMPRESSION: Satisfactory coil embolization of the left-sided lower pole renal hemorrhage. Electronically Signed   By: Cordella Banner   On: 06/30/2024 11:41   IR US  Guide Vasc Access Right Result Date: 06/30/2024 INDICATION: Patient is status post left renal biopsy with an acute hemorrhage in the lower pole of the left kidney. Planned evaluation and potential embolization of a lower pole renal artery. EXAM: Angiogram MEDICATIONS: None. The antibiotic was administered within 1 hour of the procedure ANESTHESIA/SEDATION: Moderate (conscious) sedation was employed during this procedure. A total of Versed  4 mg and Fentanyl  200 mcg was administered intravenously by the radiology nurse. 1 mg Dilaudid  also administered during the procedure. Total intra-service moderate Sedation Time: 54 minutes. The patient's level of consciousness and vital signs were monitored continuously by radiology nursing throughout the procedure under my direct supervision. CONTRAST:  75 mL Omnipaque  300 FLUOROSCOPY: Radiation Exposure Index (as provided by the fluoroscopic device): 628 mGy Kerma COMPLICATIONS: None immediate. PROCEDURE: Informed consent was obtained from the patient following explanation of the procedure, risks, benefits and alternatives. The patient understands,  agrees and consents for the procedure. All questions were addressed. A time out was performed prior to the initiation of the procedure. Maximal barrier sterile technique utilized including caps, mask, sterile gowns, sterile gloves, large sterile drape, hand hygiene, and Betadine prep. The patient was placed in a supine position on the IR table. The right groin was prepped and draped in usual sterile fashion. Local anesthesia was achieved with 1% lidocaine . A small incision was made in the right groin region for micropuncture access. The micropuncture needle was then advanced under ultrasound guidance. Ultrasound demonstrated the right common femoral artery to be anechoic and pulsatile indicating patency. Final image obtained with the needle tip in the mid lumen of the right common femoral artery and stored in the patient's permanent medical record. Access was exchanged over a guidewire for a 6 [place] sheath. The catheter was then used to selectively cannulate the left renal artery. From this position near the origin a left renal angiogram was performed. An acute arterial hemorrhage is identified in the lower pole of the left kidney. Coaxial technique was then used and the catheter and guidewire were advanced into the inferior segmental artery and subsequent into the lower pole interlobar artery on the left side to selectively perform an angiogram of the interlobar artery. Once the catheter was in the correct position of the interlobar artery demonstrating the hemorrhage, coil embolization was then performed. Detachable coils were deployed in serial fashion beginning in the interlobar artery as the catheter was backed into the  inferior segmental artery. After the deployment of 4 coils, repeat angiogram demonstrated no further hemorrhage. No further arterial flow to the region of interest. All catheters and guidewires removed from the patient. Hemostasis achieved with an Angio-Seal closure device. IMPRESSION:  Satisfactory coil embolization of the left-sided lower pole renal hemorrhage. Electronically Signed   By: Cordella Banner   On: 06/30/2024 11:41   IR Angiogram Selective Each Additional Vessel Result Date: 06/30/2024 INDICATION: Patient is status post left renal biopsy with an acute hemorrhage in the lower pole of the left kidney. Planned evaluation and potential embolization of a lower pole renal artery. EXAM: Angiogram MEDICATIONS: None. The antibiotic was administered within 1 hour of the procedure ANESTHESIA/SEDATION: Moderate (conscious) sedation was employed during this procedure. A total of Versed  4 mg and Fentanyl  200 mcg was administered intravenously by the radiology nurse. 1 mg Dilaudid  also administered during the procedure. Total intra-service moderate Sedation Time: 54 minutes. The patient's level of consciousness and vital signs were monitored continuously by radiology nursing throughout the procedure under my direct supervision. CONTRAST:  75 mL Omnipaque  300 FLUOROSCOPY: Radiation Exposure Index (as provided by the fluoroscopic device): 628 mGy Kerma COMPLICATIONS: None immediate. PROCEDURE: Informed consent was obtained from the patient following explanation of the procedure, risks, benefits and alternatives. The patient understands, agrees and consents for the procedure. All questions were addressed. A time out was performed prior to the initiation of the procedure. Maximal barrier sterile technique utilized including caps, mask, sterile gowns, sterile gloves, large sterile drape, hand hygiene, and Betadine prep. The patient was placed in a supine position on the IR table. The right groin was prepped and draped in usual sterile fashion. Local anesthesia was achieved with 1% lidocaine . A small incision was made in the right groin region for micropuncture access. The micropuncture needle was then advanced under ultrasound guidance. Ultrasound demonstrated the right common femoral artery to be  anechoic and pulsatile indicating patency. Final image obtained with the needle tip in the mid lumen of the right common femoral artery and stored in the patient's permanent medical record. Access was exchanged over a guidewire for a 6 [place] sheath. The catheter was then used to selectively cannulate the left renal artery. From this position near the origin a left renal angiogram was performed. An acute arterial hemorrhage is identified in the lower pole of the left kidney. Coaxial technique was then used and the catheter and guidewire were advanced into the inferior segmental artery and subsequent into the lower pole interlobar artery on the left side to selectively perform an angiogram of the interlobar artery. Once the catheter was in the correct position of the interlobar artery demonstrating the hemorrhage, coil embolization was then performed. Detachable coils were deployed in serial fashion beginning in the interlobar artery as the catheter was backed into the inferior segmental artery. After the deployment of 4 coils, repeat angiogram demonstrated no further hemorrhage. No further arterial flow to the region of interest. All catheters and guidewires removed from the patient. Hemostasis achieved with an Angio-Seal closure device. IMPRESSION: Satisfactory coil embolization of the left-sided lower pole renal hemorrhage. Electronically Signed   By: Cordella Banner   On: 06/30/2024 11:41       LOS: 15 days   Ole Bourdon, NP [facility] Pager: [phone]  07/01/2024, 7:56 AM

## 2024-07-01 NOTE — Assessment & Plan Note (Signed)
 Stable H/H. CT Hematuria work up on 06/28/24 shows 7.5 x 4.6 x 3.0 cm left posteroinferior perinephric hematoma with a linear track of contrast extending from the left lower pole parenchyma posteriorly into the hematoma consistent with an active bleed.  He is now s/p IR angio w/ selective renal a. embolization on 06/30/24 - Urology consult, appreciate recommendations Void trial DTV at 4 pm today Bladder scan Q3H - Renal diet - Strict I&O - Monitor UOP - Continue Flomax  0.4 mg Daily

## 2024-07-01 NOTE — Assessment & Plan Note (Signed)
 On PRN iHD per Nephrology due to no sign of renal recovery.  TDC placed by IR 9/12.  - Nephrology consultation, appreciate recommendations. He is now s/p left brachiocephalic arteriovenous fistula placement on 06/29/24 Consult vascular surgery re: AVF - Appreciate vascular surgery  iHD to day on TThS schedule  He has been accepted for outpatient HD TTS at [facility]   In the longer-term, he is interested in home therapy and kidney transplant evaluation. - RFP on Dialysis day  - Strict I's and O's - Electrolyte management as indicated

## 2024-07-01 NOTE — Assessment & Plan Note (Signed)
 Well-controlled on PO pain regimen. - Continue prednisone  taper - Pain regimen: - Acetaminophen  650 mg every 8 hours scheduled - Oxycodone  5-10 mg every 4 hours as needed for moderate/severe pain - Lidocaine  patches - Tizanidine  4 mg every 8 hours as needed - Need outpatient f/u with Rheumatology  - PT/OT follow  HTN: Continue home amLODipine  10 mg daily, Carvedilol  3.125 mg BID DMII: sSSI, Glargine 8 units started 9/14. Will plan on keep his insulin  as is for now as he will completed his Prednisolone treatment today HLD: Continue Ezetimibe  10 mg daily Bipolar 1 disorder: Increase QUEtiapine  25 mg to 50 mg at bedtime since buspirone  is now stopped. Will repeat EKG tmrw morning Depression: Stopped Buspar  7.5 mg daily  Constipation: Continue MiraLAX  twice daily, senna daily : Stool burden on CT

## 2024-07-01 NOTE — Progress Notes (Signed)
 Case discussed with nephrologist this morning. Pt is not stable for d/c today. Out-pt HD clinic is only able to start pt tomorrow if pt is able to complete paperwork today which is not possible. Pt will start at clinic on Tuesday for this reason. Spoke to pt's mother via phone to discuss pt's need to start at clinic on Tuesday. HD info provided to pt earlier this week and also added to pt's AVS. [facility] advised pt not stable for d/c today and will likely d/c this weekend and start on Tuesday. Contacted renal PA to request that orders be sent to clinic at d/c. Will assist as needed.   Randine Mungo Dialysis Navigator [phone]

## 2024-07-01 NOTE — Progress Notes (Signed)
   07/01/24 1017  Spiritual Encounters  Type of Visit Initial  Care provided to: Patient  Referral source Clinical staff  Reason for visit Advance directives  OnCall Visit No  Spiritual Framework  Presenting Themes Impactful experiences and emotions  Community/Connection Family  Patient Stress Factors Health changes  Family Stress Factors None identified  Interventions  Spiritual Care Interventions Made Compassionate presence;Established relationship of care and support  Intervention Outcomes  Outcomes Awareness of health;Awareness of support   Chaplain visited Pt to discuss Advance Directive (AD). Upon arrival, pt was up and alert, having just finished breakfast. During the conversation, he stated he wished to designated his mother as his healthcare POA.   Chaplain provided a copy of the AD for the Pt and his mother to review together. Chaplin informed Pt.that if he or his mother had any questions, chaplain services would be available to review the document with them.   Pt noted that his mother would be visiting tomorrow and expressed appreciation for the visit.

## 2024-07-01 NOTE — Progress Notes (Signed)
 Physical Therapy Treatment Patient Details Name: Edward [name] MRN: [id number] DOB: 12-23-1970 Today's Date: 07/01/2024   History of Present Illness Pt is a 53 y.o. male presenting 06/15/24 with R groin pain that radiates to lower back and causing LE weakness and inability to walk.  MRI showed increased size L4-5 disc extrusion and unchanged L5-S1 central disc protrusion.  PMH: DMII, HTN, HLD, ankylosing spondylitis in spine, bipolar 1, CKD V (pending HD), gastric bypass, L thalamic CVA 05/22/24, remote infarcts L basal ganglia and L thalamus    PT Comments  Pt progressing well and has completed acute PT goals. Pt utilized upright rollator this session however pt with no gross unsteadiness or LOB throughout hallway ambulation. Pt ambulating around room without AD. Pt reports he has a cane and a walker at home if needed after d/c. Educated pt on energy conservation with return to work and new outpatient HD schedule. Pt able to have engaged discussion regarding plans. Acute PT signing off at this time. If needs change, please reconsult.    If plan is discharge home, recommend the following: Assistance with cooking/housework;Assist for transportation;Help with stairs or ramp for entrance;A little help with walking and/or transfers;A little help with bathing/dressing/bathroom   Can travel by private vehicle     Yes  Equipment Recommendations  Rollator (4 wheels)    Recommendations for Other Services       Precautions / Restrictions Precautions Precautions: Fall Recall of Precautions/Restrictions: Intact Restrictions Weight Bearing Restrictions Per Provider Order: No     Mobility  Bed Mobility Overal bed mobility: Independent Bed Mobility: Supine to Sit           General bed mobility comments: No difficulty to transition to EOB.Pt left sitting on EOB at end of session    Transfers Overall transfer level: Modified independent Equipment used: None Transfers: Sit to/from Stand              General transfer comment: No assist and no UE support to power up to full stand. Pt stood without AD.    Ambulation/Gait Ambulation/Gait assistance: Modified independent (Device/Increase time) Gait Distance (Feet): 400 Feet Assistive device: Rollator (4 wheels) Gait Pattern/deviations: Step-through pattern, Decreased stance time - right, Drifts right/left Gait velocity: Decreased     General Gait Details: Pt utilizing upright rollator with forearm platforms. Pt ambulating well in hallway with it. No unsteadiness or LOB noted. Pt ambulating around room without an AD.   Stairs             Wheelchair Mobility     Tilt Bed    Modified Rankin (Stroke Patients Only) Modified Rankin (Stroke Patients Only) Pre-Morbid Rankin Score: Slight disability Modified Rankin: Moderate disability     Balance Overall balance assessment: Needs assistance Sitting-balance support: Single extremity supported, No upper extremity supported, Feet supported Sitting balance-Leahy Scale: Normal Sitting balance - Comments: able to sit at EOB with Supervision   Standing balance support: Bilateral upper extremity supported, During functional activity, Reliant on assistive device for balance Standing balance-Leahy Scale: Fair Standing balance comment: static standing without AD                            Communication Communication Communication: No apparent difficulties  Cognition Arousal: Alert Behavior During Therapy: WFL for tasks assessed/performed   PT - Cognitive impairments: Memory (mild deficits - has improved throughout admission)  Following commands: Intact Following commands impaired: Follows multi-step commands with increased time    Cueing Cueing Techniques: Verbal cues  Exercises      General Comments        Pertinent Vitals/Pain Pain Assessment Pain Assessment: Faces Faces Pain Scale: Hurts a little bit Pain  Location: back Pain Descriptors / Indicators: Tightness Pain Intervention(s): Limited activity within patient's tolerance, Monitored during session, Repositioned    Home Living                          Prior Function            PT Goals (current goals can now be found in the care plan section) Acute Rehab PT Goals Patient Stated Goal: Be able to return to work PT Goal Formulation: With patient Time For Goal Achievement: 06/30/24 Potential to Achieve Goals: Fair Progress towards PT goals: Goals met/education completed, patient discharged from PT    Frequency    Min 2X/week      PT Plan      Co-evaluation              AM-PAC PT 6 Clicks Mobility   Outcome Measure  Help needed turning from your back to your side while in a flat bed without using bedrails?: None Help needed moving from lying on your back to sitting on the side of a flat bed without using bedrails?: None Help needed moving to and from a bed to a chair (including a wheelchair)?: None Help needed standing up from a chair using your arms (e.g., wheelchair or bedside chair)?: None Help needed to walk in hospital room?: None Help needed climbing 3-5 steps with a railing? : A Little 6 Click Score: 23    End of Session Equipment Utilized During Treatment: Gait belt Activity Tolerance: Patient tolerated treatment well Patient left: with call bell/phone within reach;in chair Nurse Communication: Mobility status PT Visit Diagnosis: Other symptoms and signs involving the nervous system (R29.898);Pain;Muscle weakness (generalized) (M62.81) Pain - Right/Left: Right Pain - part of body:  (back)     Time: 1441-1501 PT Time Calculation (min) (ACUTE ONLY): 20 min  Charges:    $Gait Training: 8-22 mins PT General Charges $$ ACUTE PT VISIT: 1 Visit                     Leita Sable, PT, DPT Acute Rehabilitation Services Secure Chat Preferred Office: [phone]    Leita JONETTA Sable 07/01/2024, 3:27 PM

## 2024-07-02 ENCOUNTER — Other Ambulatory Visit (HOSPITAL_COMMUNITY): Payer: Self-pay

## 2024-07-02 DIAGNOSIS — R310 Gross hematuria: Secondary | ICD-10-CM | POA: Diagnosis not present

## 2024-07-02 DIAGNOSIS — M459 Ankylosing spondylitis of unspecified sites in spine: Secondary | ICD-10-CM | POA: Diagnosis not present

## 2024-07-02 LAB — RENAL FUNCTION PANEL
Albumin: 2.1 g/dL — ABNORMAL LOW (ref 3.5–5.0)
Anion gap: 8 (ref 5–15)
BUN: 43 mg/dL — ABNORMAL HIGH (ref 6–20)
CO2: 27 mmol/L (ref 22–32)
Calcium: 8 mg/dL — ABNORMAL LOW (ref 8.9–10.3)
Chloride: 102 mmol/L (ref 98–111)
Creatinine, Ser: 4.81 mg/dL — ABNORMAL HIGH (ref 0.61–1.24)
GFR, Estimated: 14 mL/min — ABNORMAL LOW (ref >60)
Glucose, Bld: 130 mg/dL — ABNORMAL HIGH (ref 70–99)
Phosphorus: 4.3 mg/dL (ref 2.5–4.6)
Potassium: 3.8 mmol/L (ref 3.5–5.1)
Sodium: 137 mmol/L (ref 135–145)

## 2024-07-02 LAB — CBC
HCT: 25.6 % — ABNORMAL LOW (ref 39.0–52.0)
Hemoglobin: 8.1 g/dL — ABNORMAL LOW (ref 13.0–17.0)
MCH: 28.7 pg (ref 26.0–34.0)
MCHC: 31.6 g/dL (ref 30.0–36.0)
MCV: 90.8 fL (ref 80.0–100.0)
Platelets: 266 K/uL (ref 150–400)
RBC: 2.82 MIL/uL — ABNORMAL LOW (ref 4.22–5.81)
RDW: 15.5 % (ref 11.5–15.5)
WBC: 10.2 K/uL (ref 4.0–10.5)
nRBC: 0 % (ref 0.0–0.2)

## 2024-07-02 LAB — GLUCOSE, CAPILLARY
Glucose-Capillary: 124 mg/dL — ABNORMAL HIGH (ref 70–99)
Glucose-Capillary: 187 mg/dL — ABNORMAL HIGH (ref 70–99)

## 2024-07-02 MED ORDER — AMLODIPINE BESYLATE 5 MG PO TABS
5.0000 mg | ORAL_TABLET | Freq: Every day | ORAL | Status: DC
  Administered 2024-07-02: 5 mg via ORAL
  Filled 2024-07-02 (×2): qty 1

## 2024-07-02 MED ORDER — PENTAFLUOROPROP-TETRAFLUOROETH EX AERO: 1.0000 | INHALATION_SPRAY | CUTANEOUS | Status: DC | PRN

## 2024-07-02 MED ORDER — RENA-VITE PO TABS
1.0000 | ORAL_TABLET | Freq: Every day | ORAL | 0 refills | Status: DC
  Filled 2024-07-02: qty 30, 30d supply, fill #0

## 2024-07-02 MED ORDER — ALTEPLASE 2 MG IJ SOLR: 2.0000 mg | Freq: Once | INTRAMUSCULAR | Status: DC | PRN

## 2024-07-02 MED ORDER — AMLODIPINE BESYLATE 5 MG PO TABS
5.0000 mg | ORAL_TABLET | Freq: Every day | ORAL | 0 refills | Status: DC
  Filled 2024-07-02: qty 30, 30d supply, fill #0

## 2024-07-02 MED ORDER — OXYCODONE HCL 5 MG PO TABS
ORAL_TABLET | ORAL | Status: AC
  Filled 2024-07-02: qty 2

## 2024-07-02 MED ORDER — TAMSULOSIN HCL 0.4 MG PO CAPS
0.4000 mg | ORAL_CAPSULE | Freq: Every day | ORAL | 0 refills | Status: AC
  Filled 2024-07-02: qty 30, 30d supply, fill #0

## 2024-07-02 MED ORDER — LIDOCAINE-PRILOCAINE 2.5-2.5 % EX CREA: 1.0000 | TOPICAL_CREAM | CUTANEOUS | Status: DC | PRN

## 2024-07-02 MED ORDER — HEPARIN SODIUM (PORCINE) 1000 UNIT/ML IJ SOLN
INTRAMUSCULAR | Status: AC
  Filled 2024-07-02: qty 4

## 2024-07-02 MED ORDER — HEPARIN SODIUM (PORCINE) 1000 UNIT/ML IJ SOLN: 1.0000 mL | Freq: Once | INTRAMUSCULAR | Status: DC

## 2024-07-02 MED ORDER — HEPARIN SODIUM (PORCINE) 1000 UNIT/ML DIALYSIS: 1000.0000 [IU] | INTRAMUSCULAR | Status: DC | PRN

## 2024-07-02 MED ORDER — LIDOCAINE HCL (PF) 1 % IJ SOLN: 5.0000 mL | INTRAMUSCULAR | Status: DC | PRN

## 2024-07-02 MED ORDER — ANTICOAGULANT SODIUM CITRATE 4% (200MG/5ML) IV SOLN: 5.0000 mL | Status: DC | PRN

## 2024-07-02 NOTE — Progress Notes (Signed)
 Daily Progress Note Intern Pager: [phone]  Patient name: Edward [name] Medical record number: [id number] Date of birth: Jun 10, 1971 Age: 53 y.o. Gender: male  Primary Care Provider: Donzetta Rollene BRAVO, MD Consultants: Nephrology, Urology, IR, VVS Code Status: Full Code  Pt Overview and Major Events to Date:  9/4: Admitted for AS flare; started on prednisone  taper 9/11: Renal biopsy w/ IR 9/12: TDC placed by IR, HD initiated 9/16: Foley placed due to failed void trial; perinephric hematoma; completed prednisone  taper 9/18: L renal artery angio and embolization; L brachiocephalic AV fistula placed  Medical Decision Making: Edward [name] is a 53 yo male admitted for acute ankylosing spondylitis flare, the admission became complicated by his kidney function.  He is s/p kidney biopsy which was complicated by perinephric hematoma, urinary retention, and gross hematuria.  Urology and IR consulted to help with management and hemostasis was achieved. Assessment & Plan Perinephric hematoma Gross hematuria Urinary retention Hemoglobin stable this morning related to the left perinephric hematoma visualized in the lower pole.  S/p left renal artery angio and embolization per IR, during which hemostasis was achieved on 9/18.  May be stable for discharge today with successful void trial and normal bladder scans, pending blessing from consultants prior to discharge. - Urology, VVS, IR consulted, appreciate recommendations - Strict I&O - Continue Flomax  0.4 mg Daily ESRD (end stage renal disease) on dialysis (HCC) HD Tu/Th/Sat.  Accepted for outpatient HD at [facility]. - Nephrology consultation, appreciate recommendations - In the longer-term, he is interested in home therapy and kidney transplant evaluation - RFP on HD days - Avoid nephrotoxic agents H/O: CVA (cerebrovascular accident) CVA 4 weeks ago, on ASA prior to admission.  ASA then was stopped for ASA washout pre-kidney biopsy (9/5 -  9/10) and continue to hold due to hematoma/hematuria till 9/19 when it was restarted.  Hgb stable this morning. - Continue ASA and SQH - Continue statin, ezetimibe   - AM CBC Ankylosing spondylitis flare Well-controlled on PO pain regimen.  Completed prednisone  taper on 9/19. - Pain regimen: - Acetaminophen  650 mg every 8 hours scheduled - Oxycodone  5-10 mg every 4 hours as needed for moderate/severe pain - Lidocaine  patches - Tizanidine  4 mg every 8 hours as needed - Need outpatient f/u with Rheumatology  - PT/OT follow Chronic health problem HTN: Nephro decreased Amlodipine  to 5 mg daily, and continuing Carvedilol  3.125 mg BID DMII: sSSI, Glargine 5 units started 9/14.  Stable CBGs overnight HLD: Continue Ezetimibe  10 mg daily Bipolar 1 disorder: Increase Quetiapine  25 mg to 50 mg at bedtime since buspirone  is now stopped d/t kidney function Depression: Stopped Buspar  7.5 mg daily  Constipation: Continue Miralax  BID and Senna daily w/ stool burden on CT   FEN/GI: Renal Diet PPx: Heparin  Dispo:Home today. Barriers include consult recs.   Subjective:  Saw Edward [name] in HD today, was tolerating it well.  He feels like he is much improved and ready to go home.  Denies any hematuria and is able to void without discomfort.  Discussed getting sign off from consultants prior to discharge.  Objective: Temp:  [97.6 F (36.4 C)-98.4 F (36.9 C)] 98.4 F (36.9 C) (09/20 1312) Pulse Rate:  [55-71] 71 (09/20 1312) Resp:  [10-22] 20 (09/20 1312) BP: (106-142)/(68-81) 131/72 (09/20 1312) SpO2:  [92 %-100 %] 95 % (09/20 1312) Weight:  [115.9 kg-116 kg] 116 kg (09/20 1123) Physical Exam: General: Awake and Alert in NAD HEENT: NCAT. Sclera anicteric. No rhinorrhea. Cardiovascular: RRR. No  M/R/G Respiratory: CTAB, normal WOB on RA. No wheezing, crackles, rhonchi, or diminished breath sounds. Abdomen: Soft, non-tender, non-distended. Bowel sounds normoactive Extremities: Able to move all  extremities. No BLE edema, no deformities or significant joint findings. Skin: Warm and dry. No abrasions or rashes noted. Neuro: A&Ox3. No focal neurological deficits.  Laboratory: Most recent CBC Lab Results  Component Value Date   WBC 10.2 07/02/2024   HGB 8.1 (L) 07/02/2024   HCT 25.6 (L) 07/02/2024   MCV 90.8 07/02/2024   PLT 266 07/02/2024   Most recent BMP    Latest Ref Rng & Units 07/02/2024    6:47 AM  BMP  Glucose 70 - 99 mg/dL 869   BUN 6 - 20 mg/dL 43   Creatinine 9.38 - 1.24 mg/dL 5.18   Sodium 864 - 854 mmol/L 137   Potassium 3.5 - 5.1 mmol/L 3.8   Chloride 98 - 111 mmol/L 102   CO2 22 - 32 mmol/L 27   Calcium  8.9 - 10.3 mg/dL 8.0    Imaging/Diagnostic Tests: No new imaging.  Janna Ferrier, DO 07/02/2024, 1:20 PM  PGY-2, [facility] FPTS Intern pager: [phone], text pages welcome Secure chat group [facility] Teaching Service

## 2024-07-02 NOTE — Assessment & Plan Note (Signed)
 Well-controlled on PO pain regimen.  Completed prednisone  taper on 9/19. - Pain regimen: - Acetaminophen  650 mg every 8 hours scheduled - Oxycodone  5-10 mg every 4 hours as needed for moderate/severe pain - Lidocaine  patches - Tizanidine  4 mg every 8 hours as needed - Need outpatient f/u with Rheumatology  - PT/OT follow

## 2024-07-02 NOTE — Progress Notes (Signed)
 Dr. Jerrye at bedside changed orders for Bp medicine and decrease fluid removal from 1000 L to 500 L.

## 2024-07-02 NOTE — Discharge Planning (Signed)
 [facility]  Initial Hemodialysis Orders  Dialysis center: [facility]   Patient's name: Edward [name] DOB: 1971/08/10 ESRD  PMH: DMT2, HTN, ankylosing spondylitis, OSA, prior CVA   Discharge diagnosis: New ESRD. 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   Allergies:  Allergies  Allergen Reactions   Demerol [Meperidine] Swelling    Swelling of hands, neck   Nsaids Other (See Comments)    Gi upset; cannot take due to kidney disease.   Tapentadol Other (See Comments)    Skin crawling;cannot take due to Kidney Disease    Tramadol  Other (See Comments)    Cannot take due to kidney disease.    Date of First Dialysis: 06/24/24 Cause of renal disease: Diabetic nephropathy   Dialysis Prescription: Dialysis Frequency: three times per week  Tx duration: 4:00 BFR: 400 DFR: 600 EDW: 116 kg   Dialyzer: 180NRe UF profile/Sodium modeling?: -- Dialysis Bath: 2 K 2 Ca  Dialysis access: R internal jugular TDC placed 06/24/24 per IR L BC AVF placed 06/29/24 by Dr. Gretta   In Center Medications: Heparin  Dose: 2000 U Bolus  VDRA: per protocol  Venofer: -- Mircera: 75 mcg IV q 2 weeks Next dose due: 07/07/24  Discharge labs: Hgb: 8.1 K+: 3.8       Ca: 8.0  Phos: 4.3 Alb: 2.1  Please draw routine monthly labs on arrival    [name] [name] PA-C

## 2024-07-02 NOTE — Assessment & Plan Note (Addendum)
 Hemoglobin stable this morning related to the left perinephric hematoma visualized in the lower pole.  S/p left renal artery angio and embolization per IR, during which hemostasis was achieved on 9/18.  May be stable for discharge today with successful void trial and normal bladder scans, pending blessing from consultants prior to discharge. - Urology, VVS, IR consulted, appreciate recommendations - Strict I&O - Continue Flomax  0.4 mg Daily

## 2024-07-02 NOTE — Discharge Summary (Signed)
 Family Medicine Teaching [facility] Discharge Summary  Patient name: Edward [name] Medical record number: [id number] Date of birth: 1971/05/24 Age: 53 y.o. Gender: male Date of Admission: 06/15/2024  Date of Discharge: 07/02/24  Admitting Physician: [name], MD  Primary Care Provider: Donzetta Rollene BRAVO, MD Consultants: Nephrology, Urology, IR, VVS  Indication for Hospitalization: Back pain 2/2 Ankylosing Spondylitis  Discharge Diagnoses/Problem List:  Principal Problem for Admission: Ankylosing Spondylitis Flare and Perinephric Hematoma Other Problems addressed during stay:  Principal Problem:   Ankylosing spondylitis flare Active Problems:   Chronic pain   Back pain   ESRD (end stage renal disease) on dialysis (HCC)   Hyperkalemia   Constipation   Gross hematuria   H/O: CVA (cerebrovascular accident)   Urinary retention   Perinephric hematoma  Brief Hospital Course:  Edward [name] is a 53 y.o.male with PMHx ankylosing spondylitis, CKDV, CVA, HTN, T2DM and, HLD, bipolar 1 and migraines presenting with back pain in the setting of flare of ankylosing spondylitis. Patient underwent renal biopsy on 06/22/24 w/ IR and initiated on HD per nephrology. His hospitalization became complicated by urinary retention, hematuria, and perinephric hematoma which required further IR involvement for embolization. His hospital course is detailed below:  Ankylosing spondylitis  Initially presented with severe pain down R leg limiting ability to stand. MRI sacrum demonstrated sacroiliitis.  Patient received Tylenol , oxycodone , dilaudid , tizanidine , and lidocaine  patches with improvement. Patient started on Prednisone  taper during his hospitalization which he completed on 06/28/24.  L Perinephric hematoma Gross hematuria Urinary retention Patient underwent renal biopsy on 9/11 which became further complicated by urinary retention, hematuria, and was found to have a perinephric hematoma  on CT s/p L renal biopsy.  H&H remained stable during admission.  Foley was required during this hospitalization.  IR performed a L renal artery angiography and embolization on 9/18, with successful postembolization hemostasis.  Foley was removed on 9/19, void trial was successful with no persistent hematuria.  ASA was restarted on 9/19 for stroke prevention, along with heparin , H&H stable prior to discharge.  Urology to follow patient outpatient.  CKD5 Rapidly progressive CKD.  Nephrology consulted who recommended renal biopsy while hospitalized.  Following ASA washout, renal biopsy completed 9/11, with pending pathology result, restarted ASA 81 mg on 9/19.  Starting HD on 9/12, underwent left brachiocephalic arteriovenous fistula placement  with vascular surgery on 9/18.  Nephrology recommended decreasing amlodipine  to 5 mg daily, and recommended close attention at HD follow-up to see if this can be discontinued.  Outpatient HD was established at [facility] to occur on Tu/Th/Sat.  Other chronic conditions were medically managed with home medications and formulary alternatives as necessary (HTN, CVA, T2DM, HLD, BP1D)  PCP Follow-up Recommendations: Ensure outpatient Rheumatology FU. Continue and adjust pain management for ankylosing spondylitis. Vascular outpatient follow up in 4-6 weeks for left arm fistula duplex.  Ensure urology follow-up outpatient. Reassess amlodipine  per nephro recs.   Results/Tests Pending at Time of Discharge: None  Disposition: Home  Discharge Condition: Stable  Discharge Exam:  Vitals:   07/02/24 1123 07/02/24 1312  BP: 138/78 131/72  Pulse: 70 71  Resp: (!) 22 20  Temp: 98 F (36.7 C) 98.4 F (36.9 C)  SpO2: 94% 95%   General: Awake and Alert in NAD HEENT: NCAT. Sclera anicteric. No rhinorrhea. Cardiovascular: RRR. No M/R/G Respiratory: CTAB, normal WOB on RA. No wheezing, crackles, rhonchi, or diminished breath sounds. Abdomen: Soft, non-tender,  non-distended. Bowel sounds normoactive Extremities: Able to move all  extremities. No BLE edema, no deformities or significant joint findings. Skin: Warm and dry. No abrasions or rashes noted. Neuro: A&Ox3. No focal neurological deficits.  Significant Procedures:  L renal biopsy - 9/10 1. Technically successful ultrasound-guided core renal biopsy, left lower pole.  IR Tunnel CVC - 9/12 Successful placement of a right jugular tunneled dialysis catheter using ultrasound and fluoroscopic guidance.  IR L renal artery angiogram and embolization - 9/18 Satisfactory coil embolization of the left-sided lower pole renal hemorrhage.  Significant Labs and Imaging:  Recent Labs  Lab 07/01/24 0248 07/02/24 0549  WBC 11.3* 10.2  HGB 8.2* 8.1*  HCT 25.8* 25.6*  PLT 279 266   Recent Labs  Lab 07/02/24 0647  NA 137  K 3.8  CL 102  CO2 27  GLUCOSE 130*  BUN 43*  CREATININE 4.81*  CALCIUM  8.0*  PHOS 4.3  ALBUMIN 2.1*   CT Head w/o contrast - 9/3 1. No acute intracranial abnormality or adverse interval change. 2. Slightly less conspicuous left thalamic lacunar infarcts compared to prior study dated 05/22/2024.   CXR - 9/3 No acute cardiopulmonary abnormality.  Pelvis XR - 9/3 No acute fracture, pelvic bone diastasis, or dislocation.  MR Lumbar Spine w/o contrast - 9/3 1. L4-5: Increased size of right subarticular disc extrusion with minimal inferior migration and narrowing of the right lateral recess. 2. L5-S1: Unchanged small central disc protrusion without central spinal canal stenosis. Unchanged moderate bilateral neuroforaminal stenosis.  MR Sacrum SIJ w/o contrast - 9/3 1. Symmetric marrow edema along the sacroiliac joints with suspected erosions, favoring sacroiliitis related to ankylosing spondylitis. 2. Tendinopathy and potentially mild partial tearing of the right proximal hamstring tendon. Minimal tendinopathy of the left proximal hamstring tendon. 3. Trace edema in  the bilateral hip adductor musculature adjacent to the pubis. 4. Subtle endplate edema at O5-4 and L5-S1.  Renal US  - 9/11 1. There is an approximately 7.3 x 9.4 cm avascular heterogeneous area in the urinary bladder, highly concerning for bladder hematoma. 2. There is 8-9 mm thick hypo to anechoic rim around the left kidney, likely representing perirenal hemorrhage in the settings of recent renal biopsy. 3. Otherwise unremarkable renal sonogram.  CT Hematuria - 9/16 1. 7.5 x 4.6 x 3.0 cm left posteroinferior perinephric hematoma with a linear track of contrast extending from the left lower pole parenchyma posteriorly into the hematoma consistent with an active bleed. 2. Diminished and delayed renal excretion bilaterally and symmetrically, consistent with history of stage IV CKD. 3. New heterogeneous material in the bladder probably clotted blood. 4. Cystitis versus bladder nondistention. 5. Constipation and diverticulosis. 6. Cholelithiasis. 7. Aortic atherosclerosis. 8. Anemia, new since August 10. 9. L4-5 right paracentral disc protrusion with mass effect on the right L5 nerve root. 10. PRA is attempting to reach the ordering physician for stat notification at the time of signing.   Pelvis US  - 9/18 Compressed bladder without acute abnormality.   Discharge Medications:  Allergies as of 07/02/2024       Reactions   Demerol [meperidine] Swelling   Swelling of hands, neck   Nsaids Other (See Comments)   Gi upset; cannot take due to kidney disease.   Tapentadol Other (See Comments)   Skin crawling;cannot take due to Kidney Disease    Tramadol  Other (See Comments)   Cannot take due to kidney disease.         Medication List     TAKE these medications    Acetaminophen  8 Hour 650 MG CR  tablet Generic drug: acetaminophen  Take 1,300 mg by mouth 2 (two) times daily.   amLODipine  5 MG tablet Commonly known as: NORVASC  Take 1 tablet (5 mg total) by mouth daily. What  changed:  medication strength how much to take   aspirin  EC 81 MG tablet Take 1 tablet (81 mg total) by mouth daily. Swallow whole.   atorvastatin  40 MG tablet Commonly known as: LIPITOR Take 1 tablet (40 mg total) by mouth daily.   carvedilol  3.125 MG tablet Commonly known as: COREG  Take 3.125 mg by mouth 2 (two) times daily.   diclofenac Sodium 1 % Gel Commonly known as: VOLTAREN Apply 4 g topically in the morning and at bedtime. Apply topically to neck and lumbar spine twice per day.   ezetimibe  10 MG tablet Commonly known as: ZETIA  Take 1 tablet (10 mg total) by mouth daily.   feeding supplement (GLUCERNA SHAKE) Liqd Take 237 mLs by mouth 3 (three) times daily between meals.   ferrous sulfate  325 (65 FE) MG tablet Take 1 tablet (325 mg total) by mouth daily with breakfast.   lidocaine  5 % Commonly known as: LIDODERM  Place 2 patches onto the skin daily. Remove & Discard patch within 12 hours or as directed by MD   multivitamin Tabs tablet Take 1 tablet by mouth at bedtime.   oxyCODONE  5 MG immediate release tablet Commonly known as: Oxy IR/ROXICODONE  Take 1 tablet (5 mg total) by mouth every 6 (six) hours as needed for up to 14 days for severe pain (pain score 7-10) or moderate pain (pain score 4-6).   pantoprazole  40 MG tablet Commonly known as: PROTONIX  Take 1 tablet (40 mg total) by mouth daily.   polyethylene glycol powder 17 GM/SCOOP powder Commonly known as: GLYCOLAX /MIRALAX  Dissolve 1 capful (17g) in 4-8 ounces of liquid and take by mouth twice daily   QUEtiapine  25 MG tablet Commonly known as: SEROquel  Take 1 tablet 25mg  at night for three days, if not oversedated can increase to 2 tablets 50mg  total at night. What changed:  how much to take additional instructions   senna 8.6 MG Tabs tablet Commonly known as: SENOKOT Take 2 tablets (17.2 mg total) by mouth daily.   sodium bicarbonate  650 MG tablet Take 2 tablets (1,300 mg total) by mouth 2 (two)  times daily.   tamsulosin  0.4 MG Caps capsule Commonly known as: FLOMAX  Take 1 capsule (0.4 mg total) by mouth daily.   tiZANidine  4 MG tablet Commonly known as: ZANAFLEX  Take 1 tablet (4 mg total) by mouth every 8 (eight) hours as needed for muscle spasms.       ASK your doctor about these medications    predniSONE  10 MG tablet Commonly known as: DELTASONE  Take 3 tablets (30 mg total) by mouth daily with breakfast for 2 days, THEN 2 tablets (20 mg total) daily with breakfast for 2 days, THEN 1 tablet (10 mg total) daily with breakfast for 2 days. Start taking on: June 23, 2024 Ask about: Should I take this medication?        Discharge Instructions: Please refer to Patient Instructions section of EMR for full details.  Patient was counseled important signs and symptoms that should prompt return to medical care, changes in medications, dietary instructions, activity restrictions, and follow up appointments.   Follow-Up Appointments:  Follow-up Information     Pray, Rollene BRAVO, MD. Go on 07/04/2024.   Specialty: Family Medicine Contact information: [address] [place] [address] [phone]  [facility]. Go on 07/05/2024.   Why: Schedule is Tuesday, Thursday, Saturday with 6:30 am start time.  On the first day (9/23), please arrive at 7:30 am to complete paperwork then have treatment.  After first day, arrive at 6:15 am for 6:30 am chair time. Contact information: [address] [place] [address] [phone]         [facility] Follow up in 5 week(s).   Specialty: Vascular Surgery Why: Office will call to arrange your appt(s) Contact information: [address] [place]  [address] [phone]                Janna Ferrier, DO 07/02/2024, 2:53 PM PGY-2, [facility]

## 2024-07-02 NOTE — Progress Notes (Signed)
 OT Cancellation Note  Patient Details Name: Edward [name] MRN: [id number] DOB: 1971-04-03   Cancelled Treatment:    Reason Eval/Treat Not Completed: Patient at procedure or test/ unavailable.  Will check back as schedule allows.    CHRISTELLA Nest Lorraine-COTA/L  07/02/2024, 7:53 AM

## 2024-07-02 NOTE — Assessment & Plan Note (Signed)
 CVA 4 weeks ago, on ASA prior to admission.  ASA then was stopped for ASA washout pre-kidney biopsy (9/5 - 9/10) and continue to hold due to hematoma/hematuria till 9/19 when it was restarted.  Hgb stable this morning. - Continue ASA and SQH - Continue statin, ezetimibe   - AM CBC

## 2024-07-02 NOTE — Assessment & Plan Note (Addendum)
 HTN: Nephro decreased Amlodipine  to 5 mg daily, and continuing Carvedilol  3.125 mg BID DMII: sSSI, Glargine 5 units started 9/14.  Stable CBGs overnight HLD: Continue Ezetimibe  10 mg daily Bipolar 1 disorder: Increase Quetiapine  25 mg to 50 mg at bedtime since buspirone  is now stopped d/t kidney function Depression: Stopped Buspar  7.5 mg daily  Constipation: Continue Miralax  BID and Senna daily w/ stool burden on CT

## 2024-07-02 NOTE — Progress Notes (Signed)
 [facility] NEPHROLOGY PROGRESS NOTE  Assessment/ Plan: Pt is a 53 y.o. yo male    # New ESRD - Patient with CKD5 progressed to new ESRD due to biopsy proven advanced diabetic glomerulosclerosis class IV, severe arterionephrosclerosis, secondary FSGS with severe interstitial fibrosis and tubular atrophy.  Rapidly progressive CKD.  UPCR almost 14 g. OP labs including ANCA, ASO, complement level, anti dsDNA ab, Hep B, Hep C, anti-GBM, anti-PLA2R ab negative. He is s/p kidney biopsy on 9/10 with DM and significant scarring as above.  S/P RIJ TDC followed by first HD on 9/12.  He had a left brachiocephalic AVF placed on 9/17 with vascular. --------------------- - HD per TTS schedule  - He has an outpatient HD unit at [facility] on a TTS schedule with financial clearance confirmed  - In the longer-term, he is interested in home therapy and kidney transplant evaluation.  Note history of gastric bypass - would need to discuss with surgery and review CT if he were to be interested in PD - Appreciate vascular surgery - he will need to follow-up with them in clinic re: his maturing AVF  # Bleeding from HD catheter site requiring prolonged pressure by the bedside nurse over the weekend.  IR evaluated.  S/p DDAVP  and resolved  # Gross hematuria after kidney biopsy:  - Reviewed kidney ultrasound results and CT results.   - Foley catheter in place and patient s/p CBI - Urology is following and they consulted IR for embolization  - IR performed left renal artery angio and selective renal artery embolization on 9/18 - foley catheter later removed    # Anemia of CKD and acute blood loss anemia due to hematuria. - Iron sat 51 % - on aranesp  60 mcg weekly on Thursdays and will need to increase outpatient   - Transfuse PRBC as needed per primary team    #Ankylosing spondylitis - With recent flare - Per primary team   #Hyperkalemia - improved; Renal diet - s/p Lokelma  and now  on dialysis.  # Metabolic acidosis - off of sodium bicarbonate , now managed with HD.   #Hypertension - avoid hypotension - I have reduced amlodipine  to 5 mg daily and will need attention to follow-up at HD unit to see if he can come off of this agent  Disposition - HD here today this AM and then disposition per urology and primary team   Edward [name], MD 9:07 AM 07/02/2024     Subjective:  He had 1.4 liters UOP over 9/19 charted.  He feels ok today.  He denies any difficulty urinating.  No more blood in his urine.  Seen and examined on dialysis.  Blood pressure 101/65 and HR 65.  Reduced UF goal.  RIJ tunn catheter in use.    Review of systems:       Denies shortness of breath or chest pain  Denies n/v    Objective Vital signs in last 24 hours: Vitals:   07/02/24 0800 07/02/24 0818 07/02/24 0830 07/02/24 0900  BP: 108/73 115/68 110/68 115/69  Pulse: (!) 55 (!) 59 60 (!) 58  Resp: 14 14 12 16   Temp:      TempSrc:      SpO2: 97% 94% 92% 94%  Weight:      Height:       Weight change:   Intake/Output Summary (Last 24 hours) at 07/02/2024 0907 Last data filed at 07/02/2024 0429 Gross per 24 hour  Intake 560 ml  Output 900 ml  Net -340 ml     Labs: RENAL PANEL Recent Labs  Lab 06/28/24 0351 06/28/24 1505 06/29/24 0353 06/30/24 0440 07/02/24 0647  NA 138 136 135 135 137  K 4.0 4.6 3.7 4.9 3.8  CL 100 99 93* 96* 102  CO2 27 27 26 27 27   GLUCOSE 110* 208* 147* 210* 130*  BUN 48* 52* 30* 42* 43*  CREATININE 3.99* 4.44* 3.23* 4.32* 4.81*  CALCIUM  8.0* 8.1* 7.8* 8.3* 8.0*  PHOS 3.9 4.0 3.4 6.2* 4.3  ALBUMIN 2.1* 2.1* 2.0* 2.2* 2.1*    Liver Function Tests: Recent Labs  Lab 06/29/24 0353 06/30/24 0440 07/02/24 0647  ALBUMIN 2.0* 2.2* 2.1*   No results for input(s): LIPASE, AMYLASE in the last 168 hours. No results for input(s): AMMONIA in the last 168 hours. CBC: Recent Labs    03/21/24 1037 05/09/24 1159 06/19/24 0437 06/22/24 0510  06/27/24 2052 06/28/24 0351 06/29/24 0353 06/29/24 1855 06/30/24 0440 07/01/24 0248 07/02/24 0549  HGB 11.3*   < >  --    < >  --    < > 8.0* 9.5* 8.7* 8.2* 8.1*  MCV 93   < >  --    < >  --    < > 90.0  --  90.4 89.3 90.8  VITAMINB12  --   --   --   --  518  --   --   --   --   --   --   FOLATE  --   --   --   --  12.8  --   --   --   --   --   --   FERRITIN 60  --  120  --   --   --   --   --   --   --   --   TIBC 298  --  209*  --   --   --   --   --   --   --   --   IRON 52  --  107  --   --   --   --   --   --   --   --    < > = values in this interval not displayed.    Cardiac Enzymes: No results for input(s): CKTOTAL, CKMB, CKMBINDEX, TROPONINI in the last 168 hours.  CBG: Recent Labs  Lab 07/01/24 0645 07/01/24 1103 07/01/24 1711 07/01/24 2136 07/02/24 0716  GLUCAP 108* 286* 172* 208* 124*    Iron Studies:  No results for input(s): IRON, TIBC, TRANSFERRIN, FERRITIN in the last 72 hours.  Studies/Results: US  PELVIS LIMITED (TRANSABDOMINAL ONLY) Result Date: 06/30/2024 CLINICAL DATA:  Hematuria EXAM: LIMITED ULTRASOUND OF PELVIS TECHNIQUE: Limited transabdominal ultrasound examination of the pelvis was performed. COMPARISON:  None Available. FINDINGS: The bladder is decompressed by Foley catheter. No definitive hematoma is identified although extremely limited due to the decompression. IMPRESSION: Compressed bladder without acute abnormality. Electronically Signed   By: Oneil Devonshire M.D.   On: 06/30/2024 21:28   IR Angiogram Visceral Selective Result Date: 06/30/2024 INDICATION: Patient is status post left renal biopsy with an acute hemorrhage in the lower pole of the left kidney. Planned evaluation and potential embolization of a lower pole renal artery. EXAM: Angiogram MEDICATIONS: None. The antibiotic was administered within 1 hour of the procedure ANESTHESIA/SEDATION: Moderate (conscious) sedation was employed during this procedure. A total of Versed  4  mg and Fentanyl   200 mcg was administered intravenously by the radiology nurse. 1 mg Dilaudid  also administered during the procedure. Total intra-service moderate Sedation Time: 54 minutes. The patient's level of consciousness and vital signs were monitored continuously by radiology nursing throughout the procedure under my direct supervision. CONTRAST:  75 mL Omnipaque  300 FLUOROSCOPY: Radiation Exposure Index (as provided by the fluoroscopic device): 628 mGy Kerma COMPLICATIONS: None immediate. PROCEDURE: Informed consent was obtained from the patient following explanation of the procedure, risks, benefits and alternatives. The patient understands, agrees and consents for the procedure. All questions were addressed. A time out was performed prior to the initiation of the procedure. Maximal barrier sterile technique utilized including caps, mask, sterile gowns, sterile gloves, large sterile drape, hand hygiene, and Betadine prep. The patient was placed in a supine position on the IR table. The right groin was prepped and draped in usual sterile fashion. Local anesthesia was achieved with 1% lidocaine . A small incision was made in the right groin region for micropuncture access. The micropuncture needle was then advanced under ultrasound guidance. Ultrasound demonstrated the right common femoral artery to be anechoic and pulsatile indicating patency. Final image obtained with the needle tip in the mid lumen of the right common femoral artery and stored in the patient's permanent medical record. Access was exchanged over a guidewire for a 6 [place] sheath. The catheter was then used to selectively cannulate the left renal artery. From this position near the origin a left renal angiogram was performed. An acute arterial hemorrhage is identified in the lower pole of the left kidney. Coaxial technique was then used and the catheter and guidewire were advanced into the inferior segmental artery and subsequent into the  lower pole interlobar artery on the left side to selectively perform an angiogram of the interlobar artery. Once the catheter was in the correct position of the interlobar artery demonstrating the hemorrhage, coil embolization was then performed. Detachable coils were deployed in serial fashion beginning in the interlobar artery as the catheter was backed into the inferior segmental artery. After the deployment of 4 coils, repeat angiogram demonstrated no further hemorrhage. No further arterial flow to the region of interest. All catheters and guidewires removed from the patient. Hemostasis achieved with an Angio-Seal closure device. IMPRESSION: Satisfactory coil embolization of the left-sided lower pole renal hemorrhage. Electronically Signed   By: Cordella Banner   On: 06/30/2024 11:41   IR EMBO ART  VEN HEMORR LYMPH EXTRAV  INC GUIDE ROADMAPPING Result Date: 06/30/2024 INDICATION: Patient is status post left renal biopsy with an acute hemorrhage in the lower pole of the left kidney. Planned evaluation and potential embolization of a lower pole renal artery. EXAM: Angiogram MEDICATIONS: None. The antibiotic was administered within 1 hour of the procedure ANESTHESIA/SEDATION: Moderate (conscious) sedation was employed during this procedure. A total of Versed  4 mg and Fentanyl  200 mcg was administered intravenously by the radiology nurse. 1 mg Dilaudid  also administered during the procedure. Total intra-service moderate Sedation Time: 54 minutes. The patient's level of consciousness and vital signs were monitored continuously by radiology nursing throughout the procedure under my direct supervision. CONTRAST:  75 mL Omnipaque  300 FLUOROSCOPY: Radiation Exposure Index (as provided by the fluoroscopic device): 628 mGy Kerma COMPLICATIONS: None immediate. PROCEDURE: Informed consent was obtained from the patient following explanation of the procedure, risks, benefits and alternatives. The patient understands,  agrees and consents for the procedure. All questions were addressed. A time out was performed prior to the initiation of the procedure. Maximal  barrier sterile technique utilized including caps, mask, sterile gowns, sterile gloves, large sterile drape, hand hygiene, and Betadine prep. The patient was placed in a supine position on the IR table. The right groin was prepped and draped in usual sterile fashion. Local anesthesia was achieved with 1% lidocaine . A small incision was made in the right groin region for micropuncture access. The micropuncture needle was then advanced under ultrasound guidance. Ultrasound demonstrated the right common femoral artery to be anechoic and pulsatile indicating patency. Final image obtained with the needle tip in the mid lumen of the right common femoral artery and stored in the patient's permanent medical record. Access was exchanged over a guidewire for a 6 [place] sheath. The catheter was then used to selectively cannulate the left renal artery. From this position near the origin a left renal angiogram was performed. An acute arterial hemorrhage is identified in the lower pole of the left kidney. Coaxial technique was then used and the catheter and guidewire were advanced into the inferior segmental artery and subsequent into the lower pole interlobar artery on the left side to selectively perform an angiogram of the interlobar artery. Once the catheter was in the correct position of the interlobar artery demonstrating the hemorrhage, coil embolization was then performed. Detachable coils were deployed in serial fashion beginning in the interlobar artery as the catheter was backed into the inferior segmental artery. After the deployment of 4 coils, repeat angiogram demonstrated no further hemorrhage. No further arterial flow to the region of interest. All catheters and guidewires removed from the patient. Hemostasis achieved with an Angio-Seal closure device. IMPRESSION:  Satisfactory coil embolization of the left-sided lower pole renal hemorrhage. Electronically Signed   By: Cordella Banner   On: 06/30/2024 11:41   IR US  Guide Vasc Access Right Result Date: 06/30/2024 INDICATION: Patient is status post left renal biopsy with an acute hemorrhage in the lower pole of the left kidney. Planned evaluation and potential embolization of a lower pole renal artery. EXAM: Angiogram MEDICATIONS: None. The antibiotic was administered within 1 hour of the procedure ANESTHESIA/SEDATION: Moderate (conscious) sedation was employed during this procedure. A total of Versed  4 mg and Fentanyl  200 mcg was administered intravenously by the radiology nurse. 1 mg Dilaudid  also administered during the procedure. Total intra-service moderate Sedation Time: 54 minutes. The patient's level of consciousness and vital signs were monitored continuously by radiology nursing throughout the procedure under my direct supervision. CONTRAST:  75 mL Omnipaque  300 FLUOROSCOPY: Radiation Exposure Index (as provided by the fluoroscopic device): 628 mGy Kerma COMPLICATIONS: None immediate. PROCEDURE: Informed consent was obtained from the patient following explanation of the procedure, risks, benefits and alternatives. The patient understands, agrees and consents for the procedure. All questions were addressed. A time out was performed prior to the initiation of the procedure. Maximal barrier sterile technique utilized including caps, mask, sterile gowns, sterile gloves, large sterile drape, hand hygiene, and Betadine prep. The patient was placed in a supine position on the IR table. The right groin was prepped and draped in usual sterile fashion. Local anesthesia was achieved with 1% lidocaine . A small incision was made in the right groin region for micropuncture access. The micropuncture needle was then advanced under ultrasound guidance. Ultrasound demonstrated the right common femoral artery to be anechoic and  pulsatile indicating patency. Final image obtained with the needle tip in the mid lumen of the right common femoral artery and stored in the patient's permanent medical record. Access was exchanged over a guidewire  for a 6 [place] sheath. The catheter was then used to selectively cannulate the left renal artery. From this position near the origin a left renal angiogram was performed. An acute arterial hemorrhage is identified in the lower pole of the left kidney. Coaxial technique was then used and the catheter and guidewire were advanced into the inferior segmental artery and subsequent into the lower pole interlobar artery on the left side to selectively perform an angiogram of the interlobar artery. Once the catheter was in the correct position of the interlobar artery demonstrating the hemorrhage, coil embolization was then performed. Detachable coils were deployed in serial fashion beginning in the interlobar artery as the catheter was backed into the inferior segmental artery. After the deployment of 4 coils, repeat angiogram demonstrated no further hemorrhage. No further arterial flow to the region of interest. All catheters and guidewires removed from the patient. Hemostasis achieved with an Angio-Seal closure device. IMPRESSION: Satisfactory coil embolization of the left-sided lower pole renal hemorrhage. Electronically Signed   By: Cordella Banner   On: 06/30/2024 11:41   IR Angiogram Selective Each Additional Vessel Result Date: 06/30/2024 INDICATION: Patient is status post left renal biopsy with an acute hemorrhage in the lower pole of the left kidney. Planned evaluation and potential embolization of a lower pole renal artery. EXAM: Angiogram MEDICATIONS: None. The antibiotic was administered within 1 hour of the procedure ANESTHESIA/SEDATION: Moderate (conscious) sedation was employed during this procedure. A total of Versed  4 mg and Fentanyl  200 mcg was administered intravenously by the radiology  nurse. 1 mg Dilaudid  also administered during the procedure. Total intra-service moderate Sedation Time: 54 minutes. The patient's level of consciousness and vital signs were monitored continuously by radiology nursing throughout the procedure under my direct supervision. CONTRAST:  75 mL Omnipaque  300 FLUOROSCOPY: Radiation Exposure Index (as provided by the fluoroscopic device): 628 mGy Kerma COMPLICATIONS: None immediate. PROCEDURE: Informed consent was obtained from the patient following explanation of the procedure, risks, benefits and alternatives. The patient understands, agrees and consents for the procedure. All questions were addressed. A time out was performed prior to the initiation of the procedure. Maximal barrier sterile technique utilized including caps, mask, sterile gowns, sterile gloves, large sterile drape, hand hygiene, and Betadine prep. The patient was placed in a supine position on the IR table. The right groin was prepped and draped in usual sterile fashion. Local anesthesia was achieved with 1% lidocaine . A small incision was made in the right groin region for micropuncture access. The micropuncture needle was then advanced under ultrasound guidance. Ultrasound demonstrated the right common femoral artery to be anechoic and pulsatile indicating patency. Final image obtained with the needle tip in the mid lumen of the right common femoral artery and stored in the patient's permanent medical record. Access was exchanged over a guidewire for a 6 [place] sheath. The catheter was then used to selectively cannulate the left renal artery. From this position near the origin a left renal angiogram was performed. An acute arterial hemorrhage is identified in the lower pole of the left kidney. Coaxial technique was then used and the catheter and guidewire were advanced into the inferior segmental artery and subsequent into the lower pole interlobar artery on the left side to selectively perform an  angiogram of the interlobar artery. Once the catheter was in the correct position of the interlobar artery demonstrating the hemorrhage, coil embolization was then performed. Detachable coils were deployed in serial fashion beginning in the interlobar artery as the catheter  was backed into the inferior segmental artery. After the deployment of 4 coils, repeat angiogram demonstrated no further hemorrhage. No further arterial flow to the region of interest. All catheters and guidewires removed from the patient. Hemostasis achieved with an Angio-Seal closure device. IMPRESSION: Satisfactory coil embolization of the left-sided lower pole renal hemorrhage. Electronically Signed   By: Cordella Banner   On: 06/30/2024 11:41     Medications: Infusions:  anticoagulant sodium citrate      sodium chloride  irrigation      Scheduled Medications:  acetaminophen   650 mg Oral Q8H   amLODipine   10 mg Oral Daily   aspirin   81 mg Oral Daily   atorvastatin   40 mg Oral Daily   bisacodyl   10 mg Rectal Once   carvedilol   3.125 mg Oral BID   Chlorhexidine  Gluconate Cloth  6 each Topical Q0600   darbepoetin (ARANESP ) injection - DIALYSIS  60 mcg Subcutaneous Q Thu-1800   ezetimibe   10 mg Oral Daily   feeding supplement (NEPRO CARB STEADY)  237 mL Oral BID   ferrous sulfate   325 mg Oral QODAY   heparin  injection (subcutaneous)  5,000 Units Subcutaneous Q8H   insulin  aspart  0-15 Units Subcutaneous TID WC   insulin  glargine  5 Units Subcutaneous Daily   lidocaine   2 patch Transdermal Q24H   lidocaine   1 Application Topical Once   multivitamin  1 tablet Oral QHS   pantoprazole   40 mg Oral Daily   polyethylene glycol  34 g Oral BID   QUEtiapine   50 mg Oral QHS   senna  2 tablet Oral BID   tamsulosin   0.4 mg Oral Daily    have reviewed scheduled and prn medications.  Physical Exam:         General adult male in bed in no acute distress HEENT normocephalic atraumatic extraocular movements intact sclera  anicteric Neck supple trachea midline Lungs clear to auscultation bilaterally normal work of breathing at rest on room air Heart S1S2 no rub Abdomen soft nontender obese habitus Extremities no edema  Psych normal mood and affect Neuro - alert and oriented x 3 provides hx and follows commands  GU - no foley Access RIJ tunneled catheter; LUE AVF with bruit and thrill    Edward [name] 07/02/2024,9:26 AM  LOS: 16 days

## 2024-07-02 NOTE — Assessment & Plan Note (Signed)
 HD Tu/Th/Sat.  Accepted for outpatient HD at [facility] - Nephrology consultation, appreciate recommendations - In the longer-term, he is interested in home therapy and kidney transplant evaluation - RFP on HD days - Avoid nephrotoxic agents

## 2024-07-03 ENCOUNTER — Encounter (HOSPITAL_COMMUNITY): Payer: Self-pay | Admitting: Family Medicine

## 2024-07-04 ENCOUNTER — Ambulatory Visit (INDEPENDENT_AMBULATORY_CARE_PROVIDER_SITE_OTHER): Payer: PRIVATE HEALTH INSURANCE | Admitting: Family Medicine

## 2024-07-04 ENCOUNTER — Other Ambulatory Visit (HOSPITAL_COMMUNITY): Payer: Self-pay

## 2024-07-04 ENCOUNTER — Encounter: Payer: Self-pay | Admitting: Family Medicine

## 2024-07-04 ENCOUNTER — Telehealth: Payer: Self-pay

## 2024-07-04 VITALS — BP 102/60 | HR 94 | Wt 256.0 lb

## 2024-07-04 DIAGNOSIS — E669 Obesity, unspecified: Secondary | ICD-10-CM

## 2024-07-04 DIAGNOSIS — Z23 Encounter for immunization: Secondary | ICD-10-CM | POA: Diagnosis not present

## 2024-07-04 DIAGNOSIS — F3162 Bipolar disorder, current episode mixed, moderate: Secondary | ICD-10-CM | POA: Diagnosis not present

## 2024-07-04 DIAGNOSIS — M450 Ankylosing spondylitis of multiple sites in spine: Secondary | ICD-10-CM | POA: Diagnosis not present

## 2024-07-04 DIAGNOSIS — E1169 Type 2 diabetes mellitus with other specified complication: Secondary | ICD-10-CM | POA: Diagnosis not present

## 2024-07-04 MED ORDER — SODIUM BICARBONATE 650 MG PO TABS: 1300.0000 mg | ORAL_TABLET | Freq: Two times a day (BID) | ORAL | 3 refills | Status: AC

## 2024-07-04 MED ORDER — TIZANIDINE HCL 4 MG PO TABS: 4.0000 mg | ORAL_TABLET | Freq: Three times a day (TID) | ORAL | 1 refills | Status: DC | PRN

## 2024-07-04 MED ORDER — CARVEDILOL 3.125 MG PO TABS: 3.1250 mg | ORAL_TABLET | Freq: Two times a day (BID) | ORAL | 3 refills | Status: AC

## 2024-07-04 MED ORDER — QUETIAPINE FUMARATE 25 MG PO TABS: ORAL_TABLET | ORAL | 2 refills | Status: DC

## 2024-07-04 MED ORDER — OXYCODONE HCL 5 MG PO TABS: 5.0000 mg | ORAL_TABLET | Freq: Three times a day (TID) | ORAL | 0 refills | Status: DC | PRN

## 2024-07-04 MED ORDER — ATORVASTATIN CALCIUM 40 MG PO TABS: 40.0000 mg | ORAL_TABLET | Freq: Every day | ORAL | 2 refills | Status: DC

## 2024-07-04 MED ORDER — EZETIMIBE 10 MG PO TABS: 10.0000 mg | ORAL_TABLET | Freq: Every day | ORAL | 3 refills | Status: AC

## 2024-07-04 MED ORDER — DEXCOM G7 SENSOR MISC: 1.0000 | 2 refills | Status: DC

## 2024-07-04 MED ORDER — ASPIRIN 81 MG PO TBEC: 81.0000 mg | DELAYED_RELEASE_TABLET | Freq: Every day | ORAL | 2 refills | Status: AC

## 2024-07-04 NOTE — Telephone Encounter (Signed)
 Prior authorization submitted for OXYCODONE  to [employer] via [employer].   Key: [id number]

## 2024-07-04 NOTE — Progress Notes (Signed)
 Late Note Entry- July 04, 2024  Pt was d/c on Saturday. Contacted [facility] this morning to be advised of pt's d/c date and that pt should start tomorrow as planned.   Randine Mungo Dialysis Navigator [phone]

## 2024-07-04 NOTE — Progress Notes (Signed)
 SUBJECTIVE:   CHIEF COMPLAINT / HPI:   Discussed the use of AI scribe software for clinical note transcription with the patient, who gave verbal consent to proceed.  History of Present Illness Edward [name] is a 53 year old male with ankylosing spondylitis who presents for follow-up after recent hospitalization. He is accompanied by his mother.  Cognitive impairment - Persistent memory impairment following recent hospitalization for complications after renal biopsy, and since stroke earlier this year - Describes feeling 'lost' - No fever, chills, or leg swelling since discharge  Chronic pain from ankylosing spondylitis - Significant pain and stiffness due to ankylosing spondylitis, particularly in the spinal area - Current regimen of oxycodone  5 mg is ineffective for pain control, but oxycodone  10mg  works. He took two this morning to come to his doctors appt - Previously managed with oxycodone  10 mg every 5-6 hours, Tylenol , and tizanidine  during hospitalization - Limited supply of pain medications at home and requires refills - Persistent soreness and stiffness  Urinary retention - Urinary retention following biopsy, improved with Flomax , currently urinating without issue since hospital discharge - No hematuria - No issues with dialysis fistula or port site  ESRD now on HD Hemodialysis and blood pressure management - Undergoes hemodialysis on Tuesday, Thursday, and Saturday schedule, first OP appointment is tomorrow - Blood pressure managed with amlodipine  and carvedilol  - Low blood pressure reading in clinic today, denies dizziness - HD cath on right chest without pain, redness, bleeding, or drainage - fistula in R arm without pain, redness, swelling, or drainage - Monitors fluid intake  Anxiety and sleep disturbance - Increased anxiety since recent hospitalization - Seroquel  50 mg at night improves anxiety and sleep - Concerned about returning to work and managing  anxiety during transition to daily activities  Diabetes mellitus management - Diabetes previously managed with insulin , currently managed through diet due to changes in kidney function - Last hemoglobin A1c was 7.4% - needs refill of Dexcom sensors  OBJECTIVE:   BP 102/60   Pulse 94   Wt 256 lb (116.1 kg)   SpO2 97%   BMI 34.72 kg/m   Physical Exam VITALS: BP- 102/60 MEASUREMENTS: Weight- 256. NECK: supple, no LAD Chest: on right chest, Port site without pain, redness, swelling, or bleeding. Bandage c/d/I.  CARDIOVASCULAR: Heart sounds normal. No murmur, normal rhythm. + palpable thrill + bruit on R upper extremity fistula.  ABDOMEN: Soft, non-distended, + bowel sounds. No costovertebral angle tenderness. MSK: bilateral lumbar paraspinal muscles TTP, no redness, no spinal tenderness or step off. Extremities: RUE fistula healing, well approximated, without induration, erythema, or drainage. No edema bilateral lower extremities   ASSESSMENT/PLAN:   Assessment & Plan Chronic pain due to ankylosing spondylitis Severe chronic pain inadequately controlled, prefers to avoid biologics, concerned about opioid dependency. - Refill oxycodone  5-10 mg every 8 hours as needed. - Refill tizanidine  4 mg every 8 hours as needed. - Coordinate with rheumatologist, referral already placed, avoiding biologics.  End-stage renal disease on hemodialysis Managed with hemodialysis, recent complications resolved, fistula site healthy. - Continue hemodialysis as scheduled. - Monitor blood pressure during dialysis. - Ensure nephrologist follow-up during dialysis. - Check labs at dialysis.  Recent ischemic stroke with residual memory impairment - Monitor cognitive function and memory. - Encourage gradual return to activities.  Insomnia due to chronic pain and anxiety Insomnia related to chronic pain and anxiety, Seroquel  increased. - Increase Seroquel  to 75 mg at night. - Monitor effectiveness of  Seroquel . - Consider further  adjustment of Seroquel  dosage.  Generalized anxiety disorder, with history of Bipolar disorder Exacerbated by recent hospitalization, managed with Seroquel , psychiatrist referral expedited. - Increase Seroquel  to 75 mg at night. - Expedite referral to psychiatrist. - Monitor for side effects and effectiveness of Seroquel .  Constipation due to opioid therapy Constipation secondary to opioid therapy, regular bowel movements reported. - Monitor bowel movements and adjust laxative use as needed. - Encourage regular bowel regimen.  Hypertension, controlled, on carvedilol , amlodipine  discontinued Hypertension controlled, amlodipine  5 mg discontinued due to low blood pressure. - Discontinue amlodipine . - Continue carvedilol . - Monitor blood pressure during dialysis.  Type 2 diabetes mellitus, stable off insulin  Diabetes well-managed off insulin , recent A1c 7.4%. - Monitor blood glucose with Dexcom G7. - Recheck A1c in two months. - Continue dietary management.  Hyperlipidemia, on therapy, refilled today  General Health Maintenance Flu vaccination recommended due to dialysis exposure risk. - Administer flu vaccine today.    F/u 2 weeks.  Rollene FORBES Keeling, MD [facility]

## 2024-07-04 NOTE — Patient Instructions (Addendum)
 It was wonderful to see you today.  Please bring ALL of your medications with you to every visit.   Today we talked about:  - I have sent in oxycodone  tablets for you to take 5-10 mg every 8 hours as needed, as well as tizanidine  4 mg tablets every 8 hours as needed.  I refilled your baby aspirin , cholesterol medications, and Coreg .  We stopped your amlodipine  5 mg daily.   I increased your seroquel  to 75 mg (three tabs) at night.  Please follow up in 2-3 weeks and send me any FMLA paperwork you need filled out  Thank you for choosing [facility].   Please call [phone] with any questions about today's appointment.  Please arrive at least 15 minutes prior to your scheduled appointments.   If you had blood work today, I will send you a MyChart message or a letter if results are normal. Otherwise, I will give you a call.   If you had a referral placed, they will call you to set up an appointment. Please give us  a call if you don't hear back in the next 2 weeks.   If you need additional refills before your next appointment, please call your pharmacy first.  Don't forget to check out the [facility] in the [facility] at [address] [phone] Affordable prices on prescriptions and over-the-counter items, as well as services like vaccinations and medication home delivery.   Rollene Keeling, MD  Family Medicine

## 2024-07-04 NOTE — Telephone Encounter (Signed)
 PA rec'd for Oxycodone .   Awaiting chart notes to close  Key [id number]

## 2024-07-05 ENCOUNTER — Other Ambulatory Visit: Payer: Self-pay | Admitting: Family Medicine

## 2024-07-05 ENCOUNTER — Other Ambulatory Visit (HOSPITAL_COMMUNITY): Payer: Self-pay

## 2024-07-05 DIAGNOSIS — Z992 Dependence on renal dialysis: Secondary | ICD-10-CM | POA: Diagnosis not present

## 2024-07-05 DIAGNOSIS — M459 Ankylosing spondylitis of unspecified sites in spine: Secondary | ICD-10-CM | POA: Diagnosis not present

## 2024-07-05 DIAGNOSIS — N186 End stage renal disease: Secondary | ICD-10-CM | POA: Diagnosis not present

## 2024-07-05 DIAGNOSIS — E1169 Type 2 diabetes mellitus with other specified complication: Secondary | ICD-10-CM

## 2024-07-05 DIAGNOSIS — N2581 Secondary hyperparathyroidism of renal origin: Secondary | ICD-10-CM | POA: Diagnosis not present

## 2024-07-05 NOTE — Telephone Encounter (Signed)
 A user error has taken place: encounter opened in error, closed for administrative reasons.

## 2024-07-06 ENCOUNTER — Ambulatory Visit (HOSPITAL_COMMUNITY): Payer: PRIVATE HEALTH INSURANCE

## 2024-07-06 ENCOUNTER — Other Ambulatory Visit: Payer: Self-pay | Admitting: Family Medicine

## 2024-07-06 DIAGNOSIS — E669 Obesity, unspecified: Secondary | ICD-10-CM

## 2024-07-06 NOTE — Telephone Encounter (Signed)
 Pharmacy Patient Advocate Encounter  Received notification from [facility] that Prior Authorization for OXYCODONE  has been APPROVED from 07/04/24 to 08/03/24   PA #/Case ID/Reference #: [id number]

## 2024-07-07 DIAGNOSIS — Z992 Dependence on renal dialysis: Secondary | ICD-10-CM | POA: Diagnosis not present

## 2024-07-07 DIAGNOSIS — N186 End stage renal disease: Secondary | ICD-10-CM | POA: Diagnosis not present

## 2024-07-07 DIAGNOSIS — N2581 Secondary hyperparathyroidism of renal origin: Secondary | ICD-10-CM | POA: Diagnosis not present

## 2024-07-09 DIAGNOSIS — N186 End stage renal disease: Secondary | ICD-10-CM | POA: Diagnosis not present

## 2024-07-09 DIAGNOSIS — N2581 Secondary hyperparathyroidism of renal origin: Secondary | ICD-10-CM | POA: Diagnosis not present

## 2024-07-09 DIAGNOSIS — Z992 Dependence on renal dialysis: Secondary | ICD-10-CM | POA: Diagnosis not present

## 2024-07-10 ENCOUNTER — Encounter: Payer: Self-pay | Admitting: Family Medicine

## 2024-07-12 DIAGNOSIS — Z992 Dependence on renal dialysis: Secondary | ICD-10-CM | POA: Diagnosis not present

## 2024-07-12 DIAGNOSIS — E1122 Type 2 diabetes mellitus with diabetic chronic kidney disease: Secondary | ICD-10-CM | POA: Diagnosis not present

## 2024-07-12 DIAGNOSIS — N186 End stage renal disease: Secondary | ICD-10-CM | POA: Diagnosis not present

## 2024-07-12 DIAGNOSIS — N2581 Secondary hyperparathyroidism of renal origin: Secondary | ICD-10-CM | POA: Diagnosis not present

## 2024-07-13 ENCOUNTER — Other Ambulatory Visit: Payer: Self-pay | Admitting: Family Medicine

## 2024-07-13 DIAGNOSIS — M450 Ankylosing spondylitis of multiple sites in spine: Secondary | ICD-10-CM

## 2024-07-13 DIAGNOSIS — Z79899 Other long term (current) drug therapy: Secondary | ICD-10-CM

## 2024-07-13 MED ORDER — OXYCODONE HCL 5 MG PO TABS: 10.0000 mg | ORAL_TABLET | Freq: Four times a day (QID) | ORAL | 0 refills | Status: DC | PRN

## 2024-07-13 MED ORDER — NALOXONE HCL 0.4 MG/ML IJ SOLN: INTRAMUSCULAR | 2 refills | Status: AC

## 2024-07-14 DIAGNOSIS — Z992 Dependence on renal dialysis: Secondary | ICD-10-CM | POA: Diagnosis not present

## 2024-07-14 DIAGNOSIS — N2581 Secondary hyperparathyroidism of renal origin: Secondary | ICD-10-CM | POA: Diagnosis not present

## 2024-07-14 DIAGNOSIS — N186 End stage renal disease: Secondary | ICD-10-CM | POA: Diagnosis not present

## 2024-07-16 DIAGNOSIS — N2581 Secondary hyperparathyroidism of renal origin: Secondary | ICD-10-CM | POA: Diagnosis not present

## 2024-07-16 DIAGNOSIS — N186 End stage renal disease: Secondary | ICD-10-CM | POA: Diagnosis not present

## 2024-07-16 DIAGNOSIS — Z992 Dependence on renal dialysis: Secondary | ICD-10-CM | POA: Diagnosis not present

## 2024-07-18 ENCOUNTER — Ambulatory Visit: Admitting: Family Medicine

## 2024-07-18 ENCOUNTER — Encounter: Payer: Self-pay | Admitting: Family Medicine

## 2024-07-18 VITALS — BP 135/84 | HR 98 | Ht 72.0 in | Wt 256.2 lb

## 2024-07-18 DIAGNOSIS — Z8719 Personal history of other diseases of the digestive system: Secondary | ICD-10-CM

## 2024-07-18 DIAGNOSIS — Z23 Encounter for immunization: Secondary | ICD-10-CM

## 2024-07-18 DIAGNOSIS — M450 Ankylosing spondylitis of multiple sites in spine: Secondary | ICD-10-CM | POA: Diagnosis not present

## 2024-07-18 MED ORDER — PANTOPRAZOLE SODIUM 40 MG PO TBEC: 40.0000 mg | DELAYED_RELEASE_TABLET | Freq: Every day | ORAL | 3 refills | Status: DC

## 2024-07-18 MED ORDER — LIDOCAINE 5 % EX PTCH: 2.0000 | MEDICATED_PATCH | CUTANEOUS | 2 refills | Status: DC

## 2024-07-18 NOTE — Patient Instructions (Signed)
 It was wonderful to see you today.  Please bring ALL of your medications with you to every visit.   Today we talked about:  - We will give you a pneumonia shot today  Thank you for choosing [facility].   Please call [phone] with any questions about today's appointment.  Please arrive at least 15 minutes prior to your scheduled appointments.   If you had blood work today, I will send you a MyChart message or a letter if results are normal. Otherwise, I will give you a call.   If you had a referral placed, they will call you to set up an appointment. Please give us  a call if you don't hear back in the next 2 weeks.   If you need additional refills before your next appointment, please call your pharmacy first.  Don't forget to check out the [facility] in the [facility] Center at [address] [phone] Affordable prices on prescriptions and over-the-counter items, as well as services like vaccinations and medication home delivery.   Rollene Keeling, MD  Family Medicine

## 2024-07-18 NOTE — Progress Notes (Signed)
 SUBJECTIVE:   CHIEF COMPLAINT / HPI:   Discussed the use of AI scribe software for clinical note transcription with the patient, who gave verbal consent to proceed.  History of Present Illness Edward [name] is a 53 year old male with chronic back pain and hypotension who presents with pain management issues and low blood pressure during dialysis. He is accompanied by his mother.  Chronic back pain - Significant back pain during dialysis sessions, intensifying after the first hour and causing difficulty sitting still during the four-hour sessions - Morning pain is severe, with recent muscle spasms - Oxycodone  10 mg every six hours is used for pain management, but there are issues with prescription refills - Trial of oxycodone  5 mg every four hours was insufficient, especially at night - Tizanidine  provides relief but causes drowsiness - Not currently using lidocaine  patches, but found them helpful in the past - Tylenol  650 mg is taken twice daily - Tramadol  was previously effective but discontinued due to kidney issues - Frustration with current pain management regimen  Hypotension during dialysis - Low blood pressure on dialysis days, with readings as low as 100/64 mmHg - Vision becomes blurry during episodes of low blood pressure - No dizziness or fainting during hypotensive episodes - No dizziness or fainting when standing  Anemia - Hemoglobin decreased to 7.9 g/dL from 8.2 g/dL - Receives injections during dialysis, possibly for anemia management - Not taking iron tablets - No blood in urine or stool  Mood and anxiety symptoms - Seroquel  taken at night (three tablets) for anxiety and mood management - Mood has improved with current regimen  Bowel function - Miralax  used as needed for regular bowel movements, no issues with constipation   OBJECTIVE:   BP 135/84   Pulse 98   Ht 6' (1.829 m)   Wt 256 lb 3.2 oz (116.2 kg)   SpO2 96%   BMI 34.75 kg/m   Physical  Exam VITALS: BP- 135/84 CARDIOVASCULAR: Heart RRR, no murmur, No JVD Respiratory: lungs CTAB Extremities: no edema bilaterally SKIN: Skin clean and dry.   ASSESSMENT/PLAN:   Assessment & Plan End-stage renal disease on hemodialysis with anemia and intradialytic hypotension Experiencing pain and hypotension during dialysis. Hemoglobin at 7.9 indicates anemia.  - Coordinate with nephrologist Dr. Melia for anemia management and potential iron infusion. - Advise blood pressure monitoring post-dialysis; consider skipping evening carvedilol  if hypotensive.  Hypertension, controlled on carvedilol  with episodes of hypotension Blood pressure controlled on carvedilol , but hypotension post-dialysis noted. - Monitor blood pressure regularly, especially post-dialysis. - Adjust carvedilol  dosing based on blood pressure readings, hold on dialysis days and in evening after dialysis - Normotensive today, last took Coreg  this morning  Ankylosing spondylitis with chronic back pain, morning stiffness, and spasms Chronic back pain with stiffness and spasms, worsened by prolonged sitting. Physical therapy ineffective in the past. - Prescribe lidocaine  patches for back spasms. - Continue oxycodone , acetaminophen  and tizanidine  for pain management. - Encourage regular movement to prevent stiffness. - Pt requiring 10 mg q6hrs PRN as previous q8hr regimen not helping, has narcan at home, refill provided as he is limited in other options to control his pain  Chronic pain management with opioid and adjunctive therapies Oxycodone  and tizanidine  used for pain management. Insurance issues with oxycodone  refill due to dosage change. [employer] ([employer]) insurance for early oxycodone  refill authorization. - Monitor for opioid accumulation or confusion. - HasNarcan injection for emergency use.  Insomnia due to chronic pain and anxiety Insomnia  persists, exacerbated by pain. Seroquel  aids mood and sleep. - Continue Seroquel  75  mg at night for mood stabilization and sleep.  Generalized anxiety disorder Anxiety management challenging due to medication interactions. Awaiting psychiatry follow-up. - Follow up on psychiatry referral. - Continue Seroquel  for anxiety until psychiatric evaluation.  Type 2 diabetes mellitus, well-managed off insulin  Diabetes well-managed off insulin . Insurance does not cover Dexcom sensors. - Continue monitoring blood glucose with glucometer. - Maintain current dietary regimen.  Hyperlipidemia, on ezetimibe  Continues on ezetimibe  and atorvastatin  for hyperlipidemia management.  Anemia of chronic disease - Recently checked at his nephrologist on dialysis, will message Dr Melia to see if I can help with any other coordination as labs not available in epic  History of GI bleed - refilled protonix , denies blood in stool     Rollene FORBES Keeling, MD [facility]

## 2024-07-19 DIAGNOSIS — N186 End stage renal disease: Secondary | ICD-10-CM | POA: Diagnosis not present

## 2024-07-19 DIAGNOSIS — N2581 Secondary hyperparathyroidism of renal origin: Secondary | ICD-10-CM | POA: Diagnosis not present

## 2024-07-19 DIAGNOSIS — Z992 Dependence on renal dialysis: Secondary | ICD-10-CM | POA: Diagnosis not present

## 2024-07-20 ENCOUNTER — Other Ambulatory Visit: Payer: Self-pay | Admitting: Family Medicine

## 2024-07-20 ENCOUNTER — Encounter: Payer: Self-pay | Admitting: Family Medicine

## 2024-07-20 DIAGNOSIS — M450 Ankylosing spondylitis of multiple sites in spine: Secondary | ICD-10-CM

## 2024-07-20 MED ORDER — OXYCODONE-ACETAMINOPHEN 10-325 MG PO TABS: 1.0000 | ORAL_TABLET | Freq: Four times a day (QID) | ORAL | 0 refills | Status: AC | PRN

## 2024-07-20 NOTE — Progress Notes (Signed)
 Percocet 10-325 q6hr PRN sent to pharmacy.

## 2024-07-20 NOTE — Telephone Encounter (Signed)
 Called pharmacy. Insurance will not pay for 5 mg rx until 10/15, due to fill on 9/22.  Pharmacist is recommending that provider send in new prescription for 10 mg oxycodone  4 times per day, to see if insurance will cover.   Please advise.   Chiquita JAYSON English, RN

## 2024-07-21 ENCOUNTER — Telehealth: Payer: Self-pay

## 2024-07-21 DIAGNOSIS — N186 End stage renal disease: Secondary | ICD-10-CM | POA: Diagnosis not present

## 2024-07-21 DIAGNOSIS — N2581 Secondary hyperparathyroidism of renal origin: Secondary | ICD-10-CM | POA: Diagnosis not present

## 2024-07-21 DIAGNOSIS — Z992 Dependence on renal dialysis: Secondary | ICD-10-CM | POA: Diagnosis not present

## 2024-07-21 NOTE — Telephone Encounter (Signed)
 Pharmacy called and cancelled Oxycodone  5 mg prescription.   Patient was able to pick up percocet 10-325 mg yesterday.   Edward JAYSON English, RN

## 2024-07-21 NOTE — Telephone Encounter (Signed)
 Prior authorization submitted for LIDOCAINE  5% PATCHES to [employer] via Lat[employer].   Key: [id number]

## 2024-07-22 NOTE — Telephone Encounter (Signed)
 Pharmacy Patient Advocate Encounter  Received notification from [facility] that Prior Authorization for LIDOCAINE  5% PATCHES has been DENIED.  Full denial letter will be uploaded to the media tab. See denial reason below.    Lidocaine  4% patches available OTC  PA #/Case ID/Reference #: [id number]

## 2024-07-23 DIAGNOSIS — N2581 Secondary hyperparathyroidism of renal origin: Secondary | ICD-10-CM | POA: Diagnosis not present

## 2024-07-23 DIAGNOSIS — Z992 Dependence on renal dialysis: Secondary | ICD-10-CM | POA: Diagnosis not present

## 2024-07-23 DIAGNOSIS — N186 End stage renal disease: Secondary | ICD-10-CM | POA: Diagnosis not present

## 2024-07-26 ENCOUNTER — Other Ambulatory Visit: Payer: Self-pay | Admitting: Vascular Surgery

## 2024-07-26 DIAGNOSIS — N1832 Chronic kidney disease, stage 3b: Secondary | ICD-10-CM

## 2024-07-26 DIAGNOSIS — N2581 Secondary hyperparathyroidism of renal origin: Secondary | ICD-10-CM | POA: Diagnosis not present

## 2024-07-26 DIAGNOSIS — N186 End stage renal disease: Secondary | ICD-10-CM | POA: Diagnosis not present

## 2024-07-26 DIAGNOSIS — Z992 Dependence on renal dialysis: Secondary | ICD-10-CM | POA: Diagnosis not present

## 2024-07-28 DIAGNOSIS — N186 End stage renal disease: Secondary | ICD-10-CM | POA: Diagnosis not present

## 2024-07-28 DIAGNOSIS — N2581 Secondary hyperparathyroidism of renal origin: Secondary | ICD-10-CM | POA: Diagnosis not present

## 2024-07-28 DIAGNOSIS — Z992 Dependence on renal dialysis: Secondary | ICD-10-CM | POA: Diagnosis not present

## 2024-07-30 DIAGNOSIS — N186 End stage renal disease: Secondary | ICD-10-CM | POA: Diagnosis not present

## 2024-07-30 DIAGNOSIS — Z992 Dependence on renal dialysis: Secondary | ICD-10-CM | POA: Diagnosis not present

## 2024-07-30 DIAGNOSIS — N2581 Secondary hyperparathyroidism of renal origin: Secondary | ICD-10-CM | POA: Diagnosis not present

## 2024-08-02 DIAGNOSIS — Z992 Dependence on renal dialysis: Secondary | ICD-10-CM | POA: Diagnosis not present

## 2024-08-02 DIAGNOSIS — N2581 Secondary hyperparathyroidism of renal origin: Secondary | ICD-10-CM | POA: Diagnosis not present

## 2024-08-02 DIAGNOSIS — N186 End stage renal disease: Secondary | ICD-10-CM | POA: Diagnosis not present

## 2024-08-04 DIAGNOSIS — N186 End stage renal disease: Secondary | ICD-10-CM | POA: Diagnosis not present

## 2024-08-04 DIAGNOSIS — N2581 Secondary hyperparathyroidism of renal origin: Secondary | ICD-10-CM | POA: Diagnosis not present

## 2024-08-04 DIAGNOSIS — Z992 Dependence on renal dialysis: Secondary | ICD-10-CM | POA: Diagnosis not present

## 2024-08-06 DIAGNOSIS — N2581 Secondary hyperparathyroidism of renal origin: Secondary | ICD-10-CM | POA: Diagnosis not present

## 2024-08-06 DIAGNOSIS — Z992 Dependence on renal dialysis: Secondary | ICD-10-CM | POA: Diagnosis not present

## 2024-08-06 DIAGNOSIS — N186 End stage renal disease: Secondary | ICD-10-CM | POA: Diagnosis not present

## 2024-08-09 DIAGNOSIS — N2581 Secondary hyperparathyroidism of renal origin: Secondary | ICD-10-CM | POA: Diagnosis not present

## 2024-08-09 DIAGNOSIS — N186 End stage renal disease: Secondary | ICD-10-CM | POA: Diagnosis not present

## 2024-08-09 DIAGNOSIS — Z992 Dependence on renal dialysis: Secondary | ICD-10-CM | POA: Diagnosis not present

## 2024-08-11 DIAGNOSIS — N186 End stage renal disease: Secondary | ICD-10-CM | POA: Diagnosis not present

## 2024-08-11 DIAGNOSIS — N2581 Secondary hyperparathyroidism of renal origin: Secondary | ICD-10-CM | POA: Diagnosis not present

## 2024-08-11 DIAGNOSIS — Z992 Dependence on renal dialysis: Secondary | ICD-10-CM | POA: Diagnosis not present

## 2024-08-12 DIAGNOSIS — N186 End stage renal disease: Secondary | ICD-10-CM | POA: Diagnosis not present

## 2024-08-12 DIAGNOSIS — E1122 Type 2 diabetes mellitus with diabetic chronic kidney disease: Secondary | ICD-10-CM | POA: Diagnosis not present

## 2024-08-12 DIAGNOSIS — Z992 Dependence on renal dialysis: Secondary | ICD-10-CM | POA: Diagnosis not present

## 2024-08-13 DIAGNOSIS — N2581 Secondary hyperparathyroidism of renal origin: Secondary | ICD-10-CM | POA: Diagnosis not present

## 2024-08-13 DIAGNOSIS — Z992 Dependence on renal dialysis: Secondary | ICD-10-CM | POA: Diagnosis not present

## 2024-08-13 DIAGNOSIS — N186 End stage renal disease: Secondary | ICD-10-CM | POA: Diagnosis not present

## 2024-08-16 ENCOUNTER — Encounter (HOSPITAL_COMMUNITY): Payer: PRIVATE HEALTH INSURANCE

## 2024-08-16 DIAGNOSIS — N186 End stage renal disease: Secondary | ICD-10-CM | POA: Diagnosis not present

## 2024-08-16 DIAGNOSIS — Z992 Dependence on renal dialysis: Secondary | ICD-10-CM | POA: Diagnosis not present

## 2024-08-16 DIAGNOSIS — N2581 Secondary hyperparathyroidism of renal origin: Secondary | ICD-10-CM | POA: Diagnosis not present

## 2024-08-17 ENCOUNTER — Encounter: Payer: Self-pay | Admitting: Physician Assistant

## 2024-08-17 ENCOUNTER — Ambulatory Visit: Payer: PRIVATE HEALTH INSURANCE | Admitting: Physician Assistant

## 2024-08-17 ENCOUNTER — Ambulatory Visit (HOSPITAL_COMMUNITY)
Admission: RE | Admit: 2024-08-17 | Discharge: 2024-08-17 | Disposition: A | Discharge status: Home / Self Care | Payer: PRIVATE HEALTH INSURANCE | Source: Ambulatory Visit | Attending: Vascular Surgery | Admitting: Vascular Surgery

## 2024-08-17 VITALS — BP 118/81 | HR 97 | Temp 97.9°F | Wt 233.9 lb

## 2024-08-17 DIAGNOSIS — Z992 Dependence on renal dialysis: Secondary | ICD-10-CM

## 2024-08-17 DIAGNOSIS — N1832 Chronic kidney disease, stage 3b: Secondary | ICD-10-CM | POA: Diagnosis not present

## 2024-08-17 DIAGNOSIS — N186 End stage renal disease: Secondary | ICD-10-CM | POA: Diagnosis not present

## 2024-08-17 NOTE — Progress Notes (Signed)
    Postoperative Access Visit   History of Present Illness   Cole Eastridge is a 53 y.o. year old male who presents for postoperative follow-up for: left brachiocephalic arteriovenous fistula (Date: 06/29/24).  The patient's wounds are  healed.  The patient denies steal symptoms.  The patient is able to complete their activities of daily living.  He is currently dialyzing via R internal jugular TDC on a TThS schedule in [employer].   Physical Examination   Vitals:   08/17/24 1408  BP: 118/81  Pulse: 97  Temp: 97.9 F (36.6 C)  TempSrc: Temporal  Weight: 233 lb 14.4 oz (106.1 kg)   Body mass index is 31.72 kg/m.  left arm Incision is healed, palpable radial pulse, hand grip is 5/5, sensation in digits is intact, palpable thrill, bruit can be auscultated     Medical Decision Making   Kennet Mccort is a 53 y.o. year old male who presents s/p left brachiocephalic arteriovenous fistula  Patent brachiocephalic fistula without signs or symptoms of steal syndrome The patient's access will be ready for use 09/28/24 The patient's tunneled dialysis catheter can be removed when Nephrology is comfortable with the performance of the fistula The patient may follow up on a prn basis   Donnice Sender PA-C [facility] Office: [phone]  Clinic MD: Sheree

## 2024-08-18 DIAGNOSIS — N2581 Secondary hyperparathyroidism of renal origin: Secondary | ICD-10-CM | POA: Diagnosis not present

## 2024-08-18 DIAGNOSIS — Z992 Dependence on renal dialysis: Secondary | ICD-10-CM | POA: Diagnosis not present

## 2024-08-18 DIAGNOSIS — N186 End stage renal disease: Secondary | ICD-10-CM | POA: Diagnosis not present

## 2024-08-20 DIAGNOSIS — N186 End stage renal disease: Secondary | ICD-10-CM | POA: Diagnosis not present

## 2024-08-20 DIAGNOSIS — N2581 Secondary hyperparathyroidism of renal origin: Secondary | ICD-10-CM | POA: Diagnosis not present

## 2024-08-20 DIAGNOSIS — Z992 Dependence on renal dialysis: Secondary | ICD-10-CM | POA: Diagnosis not present

## 2024-08-25 ENCOUNTER — Encounter: Payer: Self-pay | Admitting: Family Medicine

## 2024-08-25 MED ORDER — OXYCODONE-ACETAMINOPHEN 10-325 MG PO TABS: 1.0000 | ORAL_TABLET | Freq: Four times a day (QID) | ORAL | 0 refills | Status: DC | PRN

## 2024-08-28 ENCOUNTER — Other Ambulatory Visit: Payer: Self-pay | Admitting: Family Medicine

## 2024-09-12 ENCOUNTER — Other Ambulatory Visit (HOSPITAL_COMMUNITY): Payer: Self-pay

## 2024-09-19 ENCOUNTER — Ambulatory Visit: Admitting: Family Medicine

## 2024-09-19 NOTE — Progress Notes (Deleted)
    SUBJECTIVE:   CHIEF COMPLAINT / HPI:   Discussed the use of AI scribe software for clinical note transcription with the patient, who gave verbal consent to proceed.  History of Present Illness      PERTINENT  PMH / PSH: ankylosing spondylitis with chronic pain on opioid therapy, ESRD on HD, h/o CVA, bipolar 1 disorder, OCD, h/o GI bleed, HTN, HLD, T2DM, h/o gastric bypass  OBJECTIVE:   There were no vitals taken for this visit.  Physical Exam    ASSESSMENT/PLAN:   Assessment & Plan    Assessment and Plan Assessment & Plan       Rollene FORBES Keeling, MD [facility]

## 2024-09-23 ENCOUNTER — Ambulatory Visit: Admitting: Family Medicine

## 2024-09-23 ENCOUNTER — Ambulatory Visit: Payer: No Typology Code available for payment source

## 2024-09-23 ENCOUNTER — Telehealth: Payer: Self-pay | Admitting: Family Medicine

## 2024-09-23 ENCOUNTER — Other Ambulatory Visit: Payer: Self-pay | Admitting: Family Medicine

## 2024-09-23 DIAGNOSIS — G8929 Other chronic pain: Secondary | ICD-10-CM

## 2024-09-23 MED ORDER — OXYCODONE-ACETAMINOPHEN 10-325 MG PO TABS: 1.0000 | ORAL_TABLET | Freq: Four times a day (QID) | ORAL | 0 refills | Status: AC | PRN

## 2024-09-23 NOTE — Progress Notes (Signed)
 Oxycodone refilled.

## 2024-09-23 NOTE — Telephone Encounter (Signed)
 Called and left VM. Discussed I am sorry for scheduling/mix up with his appointment time and that we were unable to see him this morning. Left clinic number to call back to schedule a follow up appt with any available provider.  PDMP reviewed and appropriate, will be able to refill oxycodone  when due.  Rollene Keeling MD

## 2024-09-23 NOTE — Assessment & Plan Note (Deleted)
 On Seroquel  75mg  qhS, previously referred to psychiatry

## 2024-09-23 NOTE — Assessment & Plan Note (Deleted)
-

## 2024-09-23 NOTE — Assessment & Plan Note (Deleted)
 On Percocet 10/325 mg q6hrs for pain control PDMP reviewed and appropriate, refill provided for 09/24/24 Has Narcan  at home

## 2024-09-23 NOTE — Assessment & Plan Note (Deleted)
 Able to use fistula on 09/28/24, will remove TDC once fistula approved by nephrology Continue dialysis Tuesday Thursday Saturday Getting w/u at [facility] for possible kidney transplant Continue sodium bicarb BID

## 2024-09-23 NOTE — Assessment & Plan Note (Deleted)
 LDL 202 four months ago, repeat lipid panel today Continue atorvastatin  40mg  and zetia  10mg  daily

## 2024-09-23 NOTE — Progress Notes (Deleted)
° ° °  SUBJECTIVE:   CHIEF COMPLAINT / HPI:   Discussed the use of AI scribe software for clinical note transcription with the patient, who gave verbal consent to proceed.  History of Present Illness      PERTINENT  PMH / PSH: ESRD on HD TThSat, bipolar 1 disorder, ankylosing spondylitis, T2DM, h/o GI bleed, h/o CVA, perinephric hematoma, chronic pain on opioid therapy, HTN, HLD, h/o gastric bypass surgery, OCD  OBJECTIVE:   There were no vitals taken for this visit.  Physical Exam General: A&O, NAD HEENT: No sign of trauma, EOM grossly intact Cardiac: RRR, no m/r/g Respiratory: CTAB, normal WOB, no w/c/r GI: Soft, NTTP, non-distended  Extremities: NTTP, no peripheral edema. Neuro: Normal gait, moves all four extremities appropriately. Psych: Appropriate mood and affect    ASSESSMENT/PLAN:   Assessment & Plan Screen for colon cancer Screening colonoscopy ordered/referral to GI Type 2 diabetes mellitus with chronic kidney disease on chronic dialysis, without long-term current use of insulin  (HCC) A1c today  ESRD on dialysis (HCC) Able to use fistula on 09/28/24, will remove TDC once fistula approved by nephrology Continue dialysis Tuesday Thursday Saturday Getting w/u at [facility] for possible kidney transplant Continue sodium bicarb BID Hypertension associated with diabetes (HCC) At goal today, continue Coreg  3.125 mg BID Bipolar 1 disorder, mixed, moderate (HCC) On Seroquel  75mg  qhS, previously referred to psychiatry Ankylosing spondylitis of lumbar region [facility]) On Percocet 10/325 mg q6hrs for pain control PDMP reviewed and appropriate, refill provided for 09/24/24 Has Narcan  at home Mixed hyperlipidemia LDL 202 four months ago, repeat lipid panel today Continue atorvastatin  40mg  and zetia  10mg  daily    Assessment and Plan Assessment & Plan       Rollene FORBES Keeling, MD [facility]

## 2024-09-23 NOTE — Assessment & Plan Note (Deleted)
 At goal today, continue Coreg  3.125 mg BID

## 2024-09-23 NOTE — Patient Instructions (Incomplete)
 It was wonderful to see you today.  Please bring ALL of your medications with you to every visit.    Our plans for today:                          Contains text generated by Abridge.                                 Contains text generated by Abridge.   Thank you for choosing [facility].   Please call [phone] with any questions about today's appointment.  Please arrive at least 15 minutes prior to your scheduled appointments.   If you had blood work today, I will send you a MyChart message or a letter if results are normal. Otherwise, I will give you a call.   If you had a referral placed, they will call you to set up an appointment. Please give us  a call if you don't hear back in the next 2 weeks.   If you need additional refills before your next appointment, please call your pharmacy first.  Don't forget to check out the [facility] in the [facility] Center at [address] [phone] Affordable prices on prescriptions and over-the-counter items, as well as services like vaccinations and medication home delivery.   Rollene Keeling, MD  Family Medicine

## 2024-09-24 ENCOUNTER — Encounter: Payer: Self-pay | Admitting: Family Medicine

## 2024-09-24 DIAGNOSIS — F3162 Bipolar disorder, current episode mixed, moderate: Secondary | ICD-10-CM

## 2024-09-26 ENCOUNTER — Other Ambulatory Visit (HOSPITAL_COMMUNITY): Payer: Self-pay

## 2024-09-26 MED ORDER — QUETIAPINE FUMARATE 25 MG PO TABS: 75.0000 mg | ORAL_TABLET | Freq: Every day | ORAL | 3 refills | Status: AC

## 2024-09-28 ENCOUNTER — Telehealth: Payer: Self-pay | Admitting: Neurology

## 2024-09-28 NOTE — Telephone Encounter (Signed)
 Sleep smart stroke trial Research   The patient is participating in the sleep smart stroke trial but has not been returning phone calls and did not keep his scheduled appointment hence I called him.  He did not pick up his phone.  I then called his mother who received my call and connected me to the patient.  Patient was randomized into the CPAP treatment down of the sleep small stroke study but states that he did not start using his CPAP since he got sick after he left he was in the hospital for 3 weeks and is now end-stage renal disease and gets dialysis 3 days a week.  He is no longer able to sleep on the bed and has to sleep in a recliner.  He does not want to use a CPAP.  I explained to him the increased risk of stroke because of his sleep apnea if he does not use the CPAP and he understand but still does not want to use it.  I told him that I research coordinator will give him a call to get more information for him and likely schedule end of study visit.  He voiced understanding  Eather Popp, MD

## 2024-10-14 ENCOUNTER — Encounter: Payer: Self-pay | Admitting: Family Medicine

## 2024-10-17 MED ORDER — PREDNISONE 20 MG PO TABS: 40.0000 mg | ORAL_TABLET | Freq: Every day | ORAL | 0 refills | Status: AC

## 2024-10-25 ENCOUNTER — Other Ambulatory Visit: Payer: Self-pay | Admitting: Family Medicine

## 2024-10-25 ENCOUNTER — Encounter: Payer: Self-pay | Admitting: Family Medicine

## 2024-10-25 DIAGNOSIS — G894 Chronic pain syndrome: Secondary | ICD-10-CM

## 2024-10-26 MED ORDER — OXYCODONE-ACETAMINOPHEN 10-325 MG PO TABS: 1.0000 | ORAL_TABLET | Freq: Four times a day (QID) | ORAL | 0 refills | Status: DC | PRN

## 2024-10-31 ENCOUNTER — Other Ambulatory Visit: Payer: Self-pay | Admitting: Family Medicine

## 2024-10-31 ENCOUNTER — Other Ambulatory Visit (HOSPITAL_COMMUNITY): Payer: Self-pay | Admitting: Nephrology

## 2024-10-31 ENCOUNTER — Telehealth (HOSPITAL_COMMUNITY): Payer: Self-pay

## 2024-10-31 DIAGNOSIS — G894 Chronic pain syndrome: Secondary | ICD-10-CM

## 2024-10-31 DIAGNOSIS — Z992 Dependence on renal dialysis: Secondary | ICD-10-CM

## 2024-10-31 MED ORDER — OXYCODONE-ACETAMINOPHEN 10-325 MG PO TABS: 1.0000 | ORAL_TABLET | Freq: Four times a day (QID) | ORAL | 0 refills | Status: AC | PRN

## 2024-10-31 NOTE — Telephone Encounter (Signed)
LMOM for pt to callback for scheduling

## 2024-10-31 NOTE — Telephone Encounter (Addendum)
 Called pharmacy.   Patient picked up a 7 day supply on 1/16. The remaining prescription was voided.   The remaining tablets for prescription is #84.   Will forward to PCP to send in.   Per pharmacy, they are unsure if a PA is actually needed. They report his insurance requires first fill to be 7 days or less.   Will consult with pharmacy team once the remaining tablets are sent in.

## 2024-11-04 ENCOUNTER — Ambulatory Visit: Admitting: Family Medicine

## 2024-11-04 ENCOUNTER — Other Ambulatory Visit: Payer: Self-pay | Admitting: Family Medicine

## 2024-11-04 ENCOUNTER — Encounter: Payer: Self-pay | Admitting: Family Medicine

## 2024-11-04 VITALS — BP 115/74 | HR 84 | Wt 217.8 lb

## 2024-11-04 DIAGNOSIS — Z992 Dependence on renal dialysis: Secondary | ICD-10-CM | POA: Diagnosis not present

## 2024-11-04 DIAGNOSIS — M450 Ankylosing spondylitis of multiple sites in spine: Secondary | ICD-10-CM | POA: Diagnosis not present

## 2024-11-04 DIAGNOSIS — Z8719 Personal history of other diseases of the digestive system: Secondary | ICD-10-CM

## 2024-11-04 DIAGNOSIS — I152 Hypertension secondary to endocrine disorders: Secondary | ICD-10-CM | POA: Diagnosis not present

## 2024-11-04 DIAGNOSIS — N186 End stage renal disease: Secondary | ICD-10-CM | POA: Diagnosis not present

## 2024-11-04 DIAGNOSIS — F3162 Bipolar disorder, current episode mixed, moderate: Secondary | ICD-10-CM | POA: Diagnosis not present

## 2024-11-04 DIAGNOSIS — M2540 Effusion, unspecified joint: Secondary | ICD-10-CM

## 2024-11-04 DIAGNOSIS — E1159 Type 2 diabetes mellitus with other circulatory complications: Secondary | ICD-10-CM | POA: Diagnosis not present

## 2024-11-04 DIAGNOSIS — E1122 Type 2 diabetes mellitus with diabetic chronic kidney disease: Secondary | ICD-10-CM | POA: Diagnosis present

## 2024-11-04 DIAGNOSIS — G894 Chronic pain syndrome: Secondary | ICD-10-CM

## 2024-11-04 LAB — POCT GLYCOSYLATED HEMOGLOBIN (HGB A1C): HbA1c, POC (controlled diabetic range): 7.3 % — AB (ref 0.0–7.0)

## 2024-11-04 MED ORDER — LIDOCAINE 5 % EX PTCH: 2.0000 | MEDICATED_PATCH | CUTANEOUS | 2 refills | Status: AC

## 2024-11-04 MED ORDER — RENA-VITE PO TABS: 1.0000 | ORAL_TABLET | Freq: Every day | ORAL | 3 refills | Status: AC

## 2024-11-04 MED ORDER — PANTOPRAZOLE SODIUM 40 MG PO TBEC: 40.0000 mg | DELAYED_RELEASE_TABLET | Freq: Every day | ORAL | 3 refills | Status: AC

## 2024-11-04 MED ORDER — PREDNISONE 20 MG PO TABS: ORAL_TABLET | ORAL | 0 refills | Status: DC

## 2024-11-04 NOTE — Patient Instructions (Addendum)
 It was wonderful to see you today.  Please bring ALL of your medications with you to every visit.    Our plans for today:   - We will check your uric acid level  - We requested records from your nephrologist  VISIT SUMMARY: During your visit, we addressed your severe pain from ankylosing spondylitis, difficulties attending dialysis sessions, and other health concerns including diabetes, gastrointestinal issues, and hypertension. We discussed your current medications and made adjustments to help manage your symptoms more effectively.  YOUR PLAN: ANKYLOSING SPONDYLITIS WITH MULTI-JOINT INFLAMMATORY PAIN: You have severe pain in multiple joints, which is worsened by dialysis. NSAIDs are not suitable for you, and while prednisone  helps, it has side effects. -Take prednisone  20 mg, half a tablet as needed for inflammation, especially before dialysis. -We have consulted rheumatology for long-term management and potential prednisone  tapering. -Continue using Voltaren gel and lidocaine  patches for pain management. -Monitor for signs of gastrointestinal bleeding and immunosuppression.  CHRONIC PAIN SYNDROME: Your chronic pain is managed with oxycodone , but it has side effects. The pain is worsened by ankylosing spondylitis and dialysis. -Continue taking oxycodone  as prescribed, with refills available at the pharmacy. -Monitor bowel movements and manage constipation with Senokot and Miralax  as needed.  TYPE 2 DIABETES MELLITUS WITH CHRONIC KIDNEY DISEASE ON DIALYSIS: Your diabetes is well-controlled with an A1c of 7.1. Insulin  is not recommended due to your kidney disease. -Continue your current diabetes management without insulin . -Monitor your blood glucose levels regularly.  HISTORY OF GASTROINTESTINAL BLEEDING: Your history of gastrointestinal bleeding limits the use of NSAIDs and chronic steroid use. -Continue taking Protonix  40 mg daily. -Monitor for signs of gastrointestinal bleeding,  such as black or bloody stools.  HYPERLIPIDEMIA: Your high cholesterol is managed with rosuvastatin  and Zetia . Atorvastatin  was discontinued due to potential side effects. -Continue taking rosuvastatin  and Zetia  as prescribed.  HYPERTENSION: Your blood pressure is managed with carvedilol , which you hold on dialysis days. Your blood pressure is generally well-controlled. -Continue taking carvedilol  as prescribed, holding on dialysis days. -Monitor your blood pressure at home, especially after dialysis. -Consider discontinuing carvedilol  if your blood pressure remains low.  GENERAL HEALTH MAINTENANCE: We discussed renal vitamin supplementation and protein supplementation during dialysis. -Refilled your renal vitamin (Renavite) as needed. -Continue protein supplementation during dialysis.    Contains text generated by [employer].   Thank you for choosing [facility].   Please call [phone] with any questions about today's appointment.  Please arrive at least 15 minutes prior to your scheduled appointments.   If you had blood work today, I will send you a MyChart message or a letter if results are normal. Otherwise, I will give you a call.   If you had a referral placed, they will call you to set up an appointment. Please give us  a call if you don't hear back in the next 2 weeks.   If you need additional refills before your next appointment, please call your pharmacy first.  Don't forget to check out the [facility] in [facility] at [address] [phone] Affordable prices on prescriptions and over-the-counter items, as well as services like vaccinations and medication home delivery.   Rollene Keeling, MD  Family Medicine

## 2024-11-04 NOTE — Assessment & Plan Note (Signed)
 Diabetes well-controlled with A1c of 7.3 I - Continue current diabetes management without insulin . - Continue to monitor blood glucose levels regularly.

## 2024-11-04 NOTE — Assessment & Plan Note (Signed)
 Chronic pain managed with oxycodone , limited by side effects. Pain exacerbated by ankylosing spondylitis and dialysis. - Continue oxycodone  as prescribed, with refills available at pharmacy. - Monitor bowel movements and manage constipation with Senokot and Miralax  as needed.

## 2024-11-04 NOTE — Assessment & Plan Note (Signed)
 Continue seroquel  Check on status of psychiatry referral  With anxiety, but without panic attacks and discussed I do not think benzodiazepine is recommended at this time

## 2024-11-04 NOTE — Assessment & Plan Note (Signed)
 Managed with carvedilol , held on dialysis days. Blood pressure generally well-controlled. - Stop carvedilol  on non dialysis days and monitor BP - if BP < 140/90, hold Coreg  - Consider discontinuing carvedilol  if blood pressure remains low.

## 2024-11-04 NOTE — Assessment & Plan Note (Signed)
 Renal vitamin supplementation discussed. Protein supplementation provided during dialysis. - Refilled renal vitamin (Renavite) as needed. - Continue protein supplementation during dialysis.

## 2024-11-04 NOTE — Assessment & Plan Note (Signed)
 Referral to rheumatology, spot dose of prednison 10 tabs given so he can make it to HD but discussed long term risks of prednisone  including worsening of PUD and GI bleed, immunosuppression, and adrenal insufficiency - Prescribed prednisone  20 mg, take half tablet as needed for inflammation, especially before dialysis. - Consulted rheumatology for long-term management and potential prednisone  tapering. - Continue Voltaren gel and lidocaine  patches for pain management. - Monitor for signs of gastrointestinal bleeding and immunosuppression.

## 2024-11-04 NOTE — Assessment & Plan Note (Signed)
 Gastrointestinal bleeding limits use of NSAIDs and chronic steroid use. - Continue Protonix  40 mg daily. - Monitor for signs of gastrointestinal bleeding, such as black or bloody stools.

## 2024-11-04 NOTE — Progress Notes (Signed)
 "   SUBJECTIVE:   CHIEF COMPLAINT / HPI:   Discussed the use of AI scribe software for clinical note transcription with the patient, who gave verbal consent to proceed.  History of Present Illness Markevius Trombetta is a 54 year old male with ankylosing spondylitis and end-stage renal disease on dialysis who presents with severe pain and difficulty attending dialysis sessions. He is accompanied by his mother.  Musculoskeletal pain and functional limitation - Severe pain from ankylosing spondylitis involving left wrist and hand, knees, and ankles - No recent falls or wrist trauma - Pain limits ambulation and prevents movement of the left wrist and hand - Pain has caused missed dialysis sessions, most recently on 10/29/24 - Pain worsens on dialysis days  Dialysis access and attendance - End-stage renal disease on dialysis - Left arm fistula placed in December 2025 - Difficulty attending dialysis sessions due to pain - Missed most recent dialysis session on 10/29/24  Analgesic use and side effects - Uses oxycodone  3-4 times daily for pain control - Dislikes oxycodone  side effects, especially constipation - Occasionally takes Tylenol  before dialysis - Prednisone  10 mg as needed from 20 mg tablets, which helps pain on dialysis days - Concerned about NSAIDs and steroids due to prior gastrointestinal bleeding  Constipation - Constipation attributed mainly to phosphorus binder - Uses daily Senokot and adds Miralax  if no bowel movement for a couple of days - History of gastrointestinal bleeding - No recent gastrointestinal bleeding  Psychiatric symptoms - Chronic anxiety with constant worry and obsessive thinking - Prefers isolation when anxious to avoid irritability - Autism and obsessive-compulsive disorder worsen anxiety - No panic attacks or thoughts of self-harm  Anemia of chronic disease - recent Hgb 11.3 at HD, records requested today, no blood in stools or dark black  stools  T2DM without insulin  use with chronic kidney disease end stage on HD - Diabetes controlled off insulin  - Recent A1c 7.3 todaty   PERTINENT  PMH / PSH: bipolar 1 disorder, ESRD on HD with LUE AVF, ankylosing spondylitis, HTN associated diabetes, OCD, HLD, h/o CVA, h/o perinephric hematoma, chronic pain on opioid therapy, T2DM, anemia 2/2 chronic kidney disease  OBJECTIVE:   BP 115/74   Pulse 84   Wt 217 lb 12.8 oz (98.8 kg)   SpO2 98%   BMI 29.54 kg/m   Physical Exam General: A&O, NAD HEENT: No sign of trauma, EOM grossly intact Cardiac: RRR, no m/r/g Respiratory: CTAB, normal WOB, no w/c/r GI: Soft, NTTP, non-distended  Extremities: NTTP, no peripheral edema. Right wrist with limited ROM, no erythema or swelling on bruising. MSK: right shoulder with limited ROM, pain with extension Neuro: Normal gait, moves all four extremities appropriately. Psych: Appropriate mood and affect    ASSESSMENT/PLAN:   Assessment & Plan Type 2 diabetes mellitus with chronic kidney disease on chronic dialysis, without long-term current use of insulin  (HCC) Diabetes well-controlled with A1c of 7.3 I - Continue current diabetes management without insulin . - Continue to monitor blood glucose levels regularly. Chronic pain syndrome Chronic pain managed with oxycodone , limited by side effects. Pain exacerbated by ankylosing spondylitis and dialysis. - Continue oxycodone  as prescribed, with refills available at pharmacy. - Monitor bowel movements and manage constipation with Senokot and Miralax  as needed. Ankylosing spondylitis of multiple sites in spine [facility]) Referral to rheumatology, spot dose of prednison 10 tabs given so he can make it to HD but discussed long term risks of prednisone  including worsening of PUD and GI bleed, immunosuppression, and  adrenal insufficiency - Prescribed prednisone  20 mg, take half tablet as needed for inflammation, especially before dialysis. - Consulted  rheumatology for long-term management and potential prednisone  tapering. - Continue Voltaren gel and lidocaine  patches for pain management. - Monitor for signs of gastrointestinal bleeding and immunosuppression. History of GI bleed Gastrointestinal bleeding limits use of NSAIDs and chronic steroid use. - Continue Protonix  40 mg daily. - Monitor for signs of gastrointestinal bleeding, such as black or bloody stools. Joint swelling Checking uric acid level, does not appear warm or like gout flare, suspect 2/2 ankylosing spondylitis Bipolar 1 disorder, mixed, moderate (HCC) Continue seroquel  Check on status of psychiatry referral  With anxiety, but without panic attacks and discussed I do not think benzodiazepine is recommended at this time Hypertension associated with diabetes [facility]) Managed with carvedilol , held on dialysis days. Blood pressure generally well-controlled. - Stop carvedilol  on non dialysis days and monitor BP - if BP < 140/90, hold Coreg  - Consider discontinuing carvedilol  if blood pressure remains low. ESRD on dialysis [facility]) Renal vitamin supplementation discussed. Protein supplementation provided during dialysis. - Refilled renal vitamin (Renavite) as needed. - Continue protein supplementation during dialysis.    Rollene FORBES Keeling, MD [facility] "

## 2024-11-05 LAB — URIC ACID: Uric Acid: 3.6 mg/dL — ABNORMAL LOW (ref 3.8–8.4)

## 2024-11-07 ENCOUNTER — Other Ambulatory Visit (HOSPITAL_COMMUNITY)

## 2024-11-08 ENCOUNTER — Ambulatory Visit: Payer: Self-pay | Admitting: Family Medicine

## 2024-11-09 ENCOUNTER — Ambulatory Visit (HOSPITAL_COMMUNITY)
Admission: RE | Admit: 2024-11-09 | Discharge: 2024-11-09 | Disposition: A | Source: Ambulatory Visit | Attending: Nephrology | Admitting: Nephrology

## 2024-11-09 DIAGNOSIS — Z992 Dependence on renal dialysis: Secondary | ICD-10-CM | POA: Insufficient documentation

## 2024-11-09 DIAGNOSIS — N186 End stage renal disease: Secondary | ICD-10-CM | POA: Insufficient documentation

## 2024-11-09 MED ORDER — LIDOCAINE-EPINEPHRINE 1 %-1:100000 IJ SOLN
20.0000 mL | Freq: Once | INTRAMUSCULAR | Status: AC
  Administered 2024-11-09: 20 mL via INTRADERMAL

## 2024-11-09 MED ORDER — LIDOCAINE-EPINEPHRINE 1 %-1:100000 IJ SOLN
INTRAMUSCULAR | Status: AC
  Filled 2024-11-09: qty 20

## 2024-11-09 NOTE — Progress Notes (Signed)
 Interventional Radiology Procedure Note  Risks and benefits of removal of hemodialysis catheter were discussed with the patient including, but not limited to bleeding, hematoma, infection, damage to adjacent structures.  All of the patient's questions were answered, patient is agreeable to proceed. Consent signed and in chart. PROCEDURE SUMMARY:  Successful removal of tunneled dialysis catheter.  No complications.   EBL = trace  Please see full dictation in imaging section of Epic for procedure details.  Delon JAYSON Beagle NP 11/09/2024 12:19 PM

## 2024-11-17 ENCOUNTER — Telehealth: Payer: Self-pay

## 2024-11-17 NOTE — Telephone Encounter (Signed)
 Pharmacy Patient Advocate Encounter   Received notification from Onbase CMM KEY that prior authorization for LIDOCAINE  5% PATCHES is required/requested.   Insurance verification completed.   The patient is insured through [facility].   Per test claim: PA required; PA submitted to above mentioned insurance via Latent Key/confirmation #/EOC [id number]. Status is pending

## 2025-01-02 ENCOUNTER — Ambulatory Visit: Admitting: Family Medicine

## 2025-01-11 ENCOUNTER — Ambulatory Visit
